# Patient Record
Sex: Male | Born: 1945 | Race: White | Hispanic: No | Marital: Married | Smoking: Never smoker
Health system: Southern US, Community
[De-identification: ages and names within clinical notes are randomized; demographics above are authoritative.]

## PROBLEM LIST (undated history)

## (undated) DIAGNOSIS — I10 Essential (primary) hypertension: Secondary | ICD-10-CM

## (undated) DIAGNOSIS — Z8619 Personal history of other infectious and parasitic diseases: Secondary | ICD-10-CM

## (undated) DIAGNOSIS — R011 Cardiac murmur, unspecified: Secondary | ICD-10-CM

## (undated) DIAGNOSIS — F419 Anxiety disorder, unspecified: Secondary | ICD-10-CM

## (undated) DIAGNOSIS — H3320 Serous retinal detachment, unspecified eye: Secondary | ICD-10-CM

## (undated) DIAGNOSIS — Z85828 Personal history of other malignant neoplasm of skin: Secondary | ICD-10-CM

## (undated) HISTORY — DX: Anxiety disorder, unspecified: F41.9

## (undated) HISTORY — DX: Essential (primary) hypertension: I10

## (undated) HISTORY — DX: Serous retinal detachment, unspecified eye: H33.20

## (undated) HISTORY — DX: Personal history of other infectious and parasitic diseases: Z86.19

## (undated) HISTORY — DX: Personal history of other malignant neoplasm of skin: Z85.828

## (undated) HISTORY — DX: Cardiac murmur, unspecified: R01.1

---

## 2005-04-13 ENCOUNTER — Ambulatory Visit: Payer: Self-pay | Admitting: Internal Medicine

## 2005-06-08 ENCOUNTER — Ambulatory Visit: Payer: Self-pay | Admitting: Internal Medicine

## 2006-08-09 ENCOUNTER — Ambulatory Visit: Payer: Self-pay | Admitting: Internal Medicine

## 2006-08-24 ENCOUNTER — Ambulatory Visit: Payer: Self-pay | Admitting: Internal Medicine

## 2006-08-24 LAB — CONVERTED CEMR LAB
GFR calc Af Amer: 147 mL/min
GFR calc non Af Amer: 121 mL/min
HDL: 38.1 mg/dL — ABNORMAL LOW (ref >39.0)
Hgb A1c MFr Bld: 5.8 % (ref 4.6–6.0)
LDL Cholesterol: 124 mg/dL — ABNORMAL HIGH (ref 0–99)
Potassium: 4 meq/L (ref 3.5–5.1)
Sodium: 140 meq/L (ref 135–145)
Total CHOL/HDL Ratio: 4.8
Triglycerides: 98 mg/dL (ref 0–149)
VLDL: 20 mg/dL (ref 0–40)

## 2007-05-10 HISTORY — PX: CATARACT EXTRACTION: SUR2

## 2007-12-05 ENCOUNTER — Encounter: Payer: Self-pay | Admitting: Internal Medicine

## 2008-01-15 ENCOUNTER — Ambulatory Visit: Payer: Self-pay | Admitting: Internal Medicine

## 2008-01-15 LAB — CONVERTED CEMR LAB
AST: 27 units/L (ref 0–37)
Albumin: 4.1 g/dL (ref 3.5–5.2)
Alkaline Phosphatase: 60 units/L (ref 39–117)
BUN: 11 mg/dL (ref 6–23)
Bilirubin Urine: NEGATIVE
Bilirubin, Direct: 0.1 mg/dL (ref 0.0–0.3)
Blood in Urine, dipstick: NEGATIVE
Chloride: 105 meq/L (ref 96–112)
Eosinophils Absolute: 0.2 10*3/uL (ref 0.0–0.7)
Eosinophils Relative: 4 % (ref 0.0–5.0)
GFR calc Af Amer: 126 mL/min
GFR calc non Af Amer: 104 mL/min
Glucose, Urine, Semiquant: NEGATIVE
HDL: 34.6 mg/dL — ABNORMAL LOW (ref >39.0)
Ketones, urine, test strip: NEGATIVE
MCV: 105.1 fL — ABNORMAL HIGH (ref 78.0–100.0)
Monocytes Absolute: 0.8 10*3/uL (ref 0.1–1.0)
Neutrophils Relative %: 45.7 % (ref 43.0–77.0)
Nitrite: NEGATIVE
Platelets: 276 10*3/uL (ref 150–400)
Potassium: 4.7 meq/L (ref 3.5–5.1)
Protein, U semiquant: NEGATIVE
RDW: 13.1 % (ref 11.5–14.6)
Sodium: 141 meq/L (ref 135–145)
Specific Gravity, Urine: <1.005
TSH: 3.78 microintl units/mL (ref 0.35–5.50)
Total Bilirubin: 1.1 mg/dL (ref 0.3–1.2)
Total CHOL/HDL Ratio: 5.3
Triglycerides: 95 mg/dL (ref 0–149)
Urobilinogen, UA: 0.2
VLDL: 19 mg/dL (ref 0–40)
WBC Urine, dipstick: NEGATIVE
WBC: 6.1 10*3/uL (ref 4.5–10.5)
pH: 8

## 2008-01-21 ENCOUNTER — Ambulatory Visit: Payer: Self-pay | Admitting: Internal Medicine

## 2008-01-21 DIAGNOSIS — I10 Essential (primary) hypertension: Secondary | ICD-10-CM | POA: Insufficient documentation

## 2008-01-21 DIAGNOSIS — J309 Allergic rhinitis, unspecified: Secondary | ICD-10-CM | POA: Insufficient documentation

## 2008-01-21 DIAGNOSIS — J3089 Other allergic rhinitis: Secondary | ICD-10-CM | POA: Insufficient documentation

## 2008-01-21 DIAGNOSIS — R7309 Other abnormal glucose: Secondary | ICD-10-CM | POA: Insufficient documentation

## 2008-02-12 ENCOUNTER — Ambulatory Visit: Payer: Self-pay | Admitting: Internal Medicine

## 2008-02-12 ENCOUNTER — Encounter: Payer: Self-pay | Admitting: *Deleted

## 2008-02-13 LAB — CONVERTED CEMR LAB: Fecal Occult Bld: NEGATIVE

## 2008-04-07 ENCOUNTER — Telehealth: Payer: Self-pay | Admitting: Internal Medicine

## 2009-03-26 ENCOUNTER — Encounter: Payer: Self-pay | Admitting: Internal Medicine

## 2009-04-13 ENCOUNTER — Ambulatory Visit: Payer: Self-pay | Admitting: Internal Medicine

## 2009-04-13 LAB — CONVERTED CEMR LAB
Albumin: 4 g/dL (ref 3.5–5.2)
Basophils Absolute: 0 10*3/uL (ref 0.0–0.1)
Basophils Relative: 0.4 % (ref 0.0–3.0)
Bilirubin Urine: NEGATIVE
CO2: 28 meq/L (ref 19–32)
Calcium: 9.3 mg/dL (ref 8.4–10.5)
Chloride: 100 meq/L (ref 96–112)
Cholesterol: 156 mg/dL (ref 0–200)
Creatinine, Ser: 0.8 mg/dL (ref 0.4–1.5)
Eosinophils Absolute: 0.2 10*3/uL (ref 0.0–0.7)
Glucose, Bld: 118 mg/dL — ABNORMAL HIGH (ref 70–99)
Glucose, Urine, Semiquant: NEGATIVE
HDL: 30.1 mg/dL — ABNORMAL LOW (ref >39.00)
Hemoglobin: 14.7 g/dL (ref 13.0–17.0)
Lymphocytes Relative: 17.4 % (ref 12.0–46.0)
MCHC: 33.9 g/dL (ref 30.0–36.0)
MCV: 106.2 fL — ABNORMAL HIGH (ref 78.0–100.0)
Monocytes Absolute: 1.5 10*3/uL — ABNORMAL HIGH (ref 0.1–1.0)
Neutro Abs: 8.1 10*3/uL — ABNORMAL HIGH (ref 1.4–7.7)
Neutrophils Relative %: 68 % (ref 43.0–77.0)
PSA: 1.65 ng/mL (ref 0.10–4.00)
RBC: 4.09 M/uL — ABNORMAL LOW (ref 4.22–5.81)
RDW: 11.9 % (ref 11.5–14.6)
Specific Gravity, Urine: <1.005
Total CHOL/HDL Ratio: 5
Total Protein: 7.6 g/dL (ref 6.0–8.3)
Triglycerides: 80 mg/dL (ref 0.0–149.0)
pH: 6

## 2009-04-21 ENCOUNTER — Ambulatory Visit: Payer: Self-pay | Admitting: Internal Medicine

## 2009-04-21 LAB — CONVERTED CEMR LAB: Blood Glucose, Fingerstick: 138

## 2009-05-14 ENCOUNTER — Encounter: Payer: Self-pay | Admitting: Internal Medicine

## 2009-06-01 ENCOUNTER — Telehealth: Payer: Self-pay | Admitting: Internal Medicine

## 2010-06-08 NOTE — Progress Notes (Signed)
Summary: List of BP's and Medications provided by patient  List of BP's and Medications provided by patient   Imported By: Maryln Gottron 05/20/2009 13:43:59  _____________________________________________________________________  External Attachment:    Type:   Image     Comment:   External Document

## 2010-06-08 NOTE — Letter (Signed)
Summary: Letter from patient regarding appointment  Letter from patient regarding appointment   Imported By: Maryln Gottron 05/12/2009 13:45:02  _____________________________________________________________________  External Attachment:    Type:   Image     Comment:   External Document

## 2010-06-08 NOTE — Progress Notes (Signed)
Summary: rx for shingles vaccine  Phone Note Call from Patient Call back at Home Phone [phone]   Caller: Patient Summary of Call: Shingles vaccine to [facility]  Initial call taken by: Romualdo Bolk, CMA Duncan Dull),  June 01, 2009 1:15 PM  Follow-up for Phone Call        rx sent electronically Follow-up by: Romualdo Bolk, CMA (AAMA),  June 01, 2009 1:16 PM    New/Updated Medications: ZOSTAVAX 57846 UNT/0.65ML SOLR (ZOSTER VACCINE LIVE) inject subq as directed Prescriptions: ZOSTAVAX 96295 UNT/0.65ML SOLR (ZOSTER VACCINE LIVE) inject subq as directed  #1 x 0   Entered by:   Romualdo Bolk, CMA (AAMA)   Authorized by:   Madelin Headings MD   Signed by:   Romualdo Bolk, CMA (AAMA) on 06/01/2009   Method used:   Electronically to        [facility]* (retail)       [address] [place], [place]  [address]       Ph: [phone] or [phone]       Fax: [phone]   RxID:   [id number]

## 2010-07-26 ENCOUNTER — Other Ambulatory Visit: Payer: Self-pay | Admitting: Internal Medicine

## 2010-07-28 ENCOUNTER — Encounter: Payer: Self-pay | Admitting: *Deleted

## 2010-07-28 DIAGNOSIS — Z125 Encounter for screening for malignant neoplasm of prostate: Secondary | ICD-10-CM | POA: Insufficient documentation

## 2010-07-28 DIAGNOSIS — R718 Other abnormality of red blood cells: Secondary | ICD-10-CM | POA: Insufficient documentation

## 2010-08-16 ENCOUNTER — Other Ambulatory Visit (INDEPENDENT_AMBULATORY_CARE_PROVIDER_SITE_OTHER): Payer: PRIVATE HEALTH INSURANCE | Admitting: Internal Medicine

## 2010-08-16 DIAGNOSIS — R739 Hyperglycemia, unspecified: Secondary | ICD-10-CM

## 2010-08-16 DIAGNOSIS — E785 Hyperlipidemia, unspecified: Secondary | ICD-10-CM

## 2010-08-16 DIAGNOSIS — R7309 Other abnormal glucose: Secondary | ICD-10-CM

## 2010-08-16 DIAGNOSIS — R972 Elevated prostate specific antigen [PSA]: Secondary | ICD-10-CM

## 2010-08-16 LAB — CBC WITH DIFFERENTIAL/PLATELET
Basophils Absolute: 0 10*3/uL (ref 0.0–0.1)
Eosinophils Relative: 10.5 % — ABNORMAL HIGH (ref 0.0–5.0)
HCT: 43.5 % (ref 39.0–52.0)
Lymphs Abs: 1.9 10*3/uL (ref 0.7–4.0)
MCV: 103.1 fl — ABNORMAL HIGH (ref 78.0–100.0)
Monocytes Absolute: 0.7 10*3/uL (ref 0.1–1.0)
Platelets: 263 10*3/uL (ref 150.0–400.0)
RDW: 13.2 % (ref 11.5–14.6)

## 2010-08-16 LAB — BASIC METABOLIC PANEL
BUN: 14 mg/dL (ref 6–23)
Chloride: 99 mEq/L (ref 96–112)
Glucose, Bld: 115 mg/dL — ABNORMAL HIGH (ref 70–99)
Potassium: 3.9 mEq/L (ref 3.5–5.1)

## 2010-08-16 LAB — LIPID PANEL: Cholesterol: 184 mg/dL (ref 0–200)

## 2010-08-16 LAB — TSH: TSH: 3.64 u[IU]/mL (ref 0.35–5.50)

## 2010-08-19 ENCOUNTER — Encounter: Payer: Self-pay | Admitting: Internal Medicine

## 2010-08-23 ENCOUNTER — Encounter: Payer: Self-pay | Admitting: Internal Medicine

## 2010-08-23 ENCOUNTER — Ambulatory Visit (INDEPENDENT_AMBULATORY_CARE_PROVIDER_SITE_OTHER): Payer: PRIVATE HEALTH INSURANCE | Admitting: Internal Medicine

## 2010-08-23 ENCOUNTER — Telehealth: Payer: Self-pay | Admitting: *Deleted

## 2010-08-23 VITALS — BP 140/80 | HR 88 | Ht 62.75 in | Wt 148.0 lb

## 2010-08-23 DIAGNOSIS — I10 Essential (primary) hypertension: Secondary | ICD-10-CM

## 2010-08-23 DIAGNOSIS — J309 Allergic rhinitis, unspecified: Secondary | ICD-10-CM

## 2010-08-23 DIAGNOSIS — R718 Other abnormality of red blood cells: Secondary | ICD-10-CM

## 2010-08-23 DIAGNOSIS — Z0000 Encounter for general adult medical examination without abnormal findings: Secondary | ICD-10-CM

## 2010-08-23 DIAGNOSIS — E785 Hyperlipidemia, unspecified: Secondary | ICD-10-CM

## 2010-08-23 DIAGNOSIS — R7309 Other abnormal glucose: Secondary | ICD-10-CM

## 2010-08-23 DIAGNOSIS — Z23 Encounter for immunization: Secondary | ICD-10-CM

## 2010-08-23 MED ORDER — METOPROLOL SUCCINATE ER 25 MG PO TB24: ORAL_TABLET | ORAL | Status: DC

## 2010-08-23 MED ORDER — LISINOPRIL 20 MG PO TABS: 20.0000 mg | ORAL_TABLET | Freq: Every day | ORAL | Status: DC

## 2010-08-23 NOTE — Telephone Encounter (Signed)
Per Dr. Fabian Sharp- ask pharmacy to see if metoprolol xl 50ml taking 1/2 daily #45 with the same about refills. Pt aware of this as well.

## 2010-08-23 NOTE — Telephone Encounter (Signed)
Pharmacy called saying that the metoprolol xl 25mg  taking 1 1/2 tabs a daily would cost pt $109.40 #90. Where as the metoprolol 50mg  was a lot cheaper for him.

## 2010-08-23 NOTE — Patient Instructions (Addendum)
We will change your metoprolol to extended release and take 1-1/2 a day of a 25 mg Continue on the lisinopril continue to monitor your blood pressure readings. Ask your eye physicians about the use of Flonase if this may help your allergic symptoms better than the past 10 days Walking is an excellent exercise continue on the Mediterranean diet.   Phone in or mail in or fax in blood pressure readings  in about 3 months. Otherwise we can continue to see you yearly.

## 2010-08-23 NOTE — Progress Notes (Signed)
Medicare Attestation I have personally reviewed: The patient's medical and social history Their use of alcohol, tobacco or illicit drugs Their current medications and supplements The patient's functional ability including ADLs,fall risks, home safety risks, cognitive, and hearing and visual impairment Diet and physical activities Evidence for depression or mood disorders  The patient's weight, height, BMI, and visual acuity have been recorded in the chart.  I have made referrals, counseling, and provided education to the patient based on review of the above and I have provided the patient with a written personalized care plan for preventive services.     Lorretta Harp   08/23/2010       Subjective:    Samuel Pearson is a 65 y.o. male who presents forto Medicare exam.  And   Medication  Evaluation and update  No major change in health status since last visit . ecept he had a dteached retina HT: he is on IR metoprolol 25 bid but often forgets tot take the second dose of med.  NO se noted  BP readings 139/82 with cuff before coming in to visit . Has whit coat ohenom  His other readings at home are at goal majority of time.  He denies any CV pulm sx with this.  Denies se of ace .  LIPIDS; taking niacin on his own and denies se and flushing .  Want to continue this regimen.  Allergy : sometimes problematic with eye sx and nose . Takes alavert.  On patanol type drops and ns and doing adequately.  No wheezing or chronic cough  Cardiac risk factors: advanced age (older than 14 for men, 61 for women), hypertension and male gender.  Depression Screen (Note: if answer to either of the following is "Yes", a more complete depression screening is indicated)  Q1: Over the past two weeks, have you felt down, depressed or hopeless? no Q2: Over the past two weeks, have you felt little interest or pleasure in doing things? no  Activities of Daily Living In your present state of health, do  you have any difficulty performing the following activities?:  Preparing food and eating?: No Bathing yourself: No Getting dressed: No Using the toilet:No Moving around from place to place: No In the past year have you fallen or had a near fall?:No  Current exercise habits: Home exercise routine includes walking 1/2 hrs per day.  Dietary issues discussed: reviewed no limitations  Hearing difficulties: No Safe in current home environment:   Safety: No falls .  Has smoke detector and wears seat belts.  No firearms. No excess sun exposure. Sees dentist regularly   Advance directive :  Reviewed  Memory:   No sig concerns  Past Medical History  Diagnosis Date  . Allergic rhinitis   . Hypertension   . History of chicken pox   . Detached retina    Past Surgical History  Procedure Date  . Cataract extraction 2009    [facility]    reports that he has never smoked. He does not have any smokeless tobacco history on file. He reports that he drinks alcohol. He reports that he does not use illicit drugs. family history includes Arthritis in his father and mother and Hypertension in his father and mother. No Known Allergies   The following portions of the patient's history were reviewed and updated as appropriate: allergies, current medications, past family history, past medical history, past social history, past surgical history and problem list.  Review of Systems A comprehensive  review of systems was negative.   Except as per hpin     Objective:     Vision  Last eye exam: Dr. Philip Aspen done on 09/06/2009- Cataracts surgery and detatched retina Blood pressure 140/80, pulse 88, height 5' 2.75" (1.594 m), weight 148 lb (67.132 kg). Body mass index is 26.43 kg/(m^2).   Physical Exam: Vital signs reviewed ZOX:WRUE is a well-developed well-nourished alert cooperative  White male  who appears   stated age in no acute distress.  HEENT: normocephalic  traumatic , Eyes: PERRL EOM's  full, conjunctiva clear,  Glasses Nares: patent no deformity discharge or tenderness. Mild congestions, Ears: no deformity EAC's clear TMs with normal landmarks. Mouth: clear OP, no lesions, edema.  Moist mucous membranes. Dentition in adequate repair. NECK: supple without masses, thyromegaly or bruits. CHEST/PULM:  Clear to auscultation and percussion breath sounds equal no wheeze , rales or rhonchi. No chest wall deformities or tenderness. CV: PMI is nondisplaced, S1 S2 no gallops, murmurs, rubs. Peripheral pulses are full without delay.No JVD .  ABDOMEN: Bowel sounds normal nontender  No guard or rebound, no hepato splenomegal no CVA tenderness.  No hernia. Extremtities:  No clubbing cyanosis or edema, no acute joint swelling or redness no focal atrophy NEURO:  Oriented x3, cranial nerves 3-12 appear to be intact, no obvious focal weakness,gait within normal limits no abnormal reflexes or asymmetrical SKIN: No acute rashes normal turgor, color, no bruising or petechiae. PSYCH: Oriented, good eye contact, no obvious depression anxiety, cognition and judgment appear normal. LN:  No cervical axillary or inguinal adenopathy prostate declined this year. No sx          Assessment:  Medicare wellness  HT  With  White coat effect but good at home.   Had se of diuretics and other meds   He is however missing second dose  Would be better to take xr of the b blocker to reduce swings in bp .    Will change to xr 25  But if  Too expensive then 1/2 of 50 xr  Once a day  . LIPIDS: prefers no statin wants to remain on niacin Allergic  Eye and nasal sx   No alarm features  Counseled.   Expectant management.        Plan:  See above.    During the course of the visit the patient was educated and counseled about appropriate screening and preventive services including:   Pneumococcal vaccine   Nutrition counseling   Patient Instructions (the written plan) was given to the patient.

## 2010-08-29 ENCOUNTER — Encounter: Payer: Self-pay | Admitting: Internal Medicine

## 2010-08-29 DIAGNOSIS — E785 Hyperlipidemia, unspecified: Secondary | ICD-10-CM | POA: Insufficient documentation

## 2010-08-29 DIAGNOSIS — Z0000 Encounter for general adult medical examination without abnormal findings: Secondary | ICD-10-CM | POA: Insufficient documentation

## 2010-08-29 NOTE — Assessment & Plan Note (Signed)
fbs    Elevated  Somewhat but a1c is below 6

## 2010-08-29 NOTE — Assessment & Plan Note (Addendum)
Stable.     Check  Yearly.

## 2010-09-22 ENCOUNTER — Telehealth: Payer: Self-pay | Admitting: *Deleted

## 2010-09-22 NOTE — Telephone Encounter (Signed)
Pt wrote a letter stating that he began taking metoprolol 50mg  er. After a week of sig. Swings in his bp, it settled down. However, his sinusis became constantly swollen and he didn't feel right. He had a flavored coffee last week and a rash covered his face, neck and arms within mins. He states that it felt like a sunburn. He took a benadryl and it was fine after 1 hour. He has d/c the er and is back to taking Metroprolol 25mg  bid. He feels much better and sinuses have cleared.

## 2010-09-23 NOTE — Telephone Encounter (Signed)
Am unsure if this was really the med and not allergy but ok to switch back to IR med if Bp controlled.

## 2010-09-23 NOTE — Telephone Encounter (Signed)
Rx sent to pharmacy and pt aware. 

## 2011-02-10 ENCOUNTER — Telehealth: Payer: Self-pay | Admitting: *Deleted

## 2011-02-10 DIAGNOSIS — J3489 Other specified disorders of nose and nasal sinuses: Secondary | ICD-10-CM

## 2011-02-10 NOTE — Telephone Encounter (Signed)
Pt sent a letter saying that during his annual exam you discussed a spot on the side of his nose. You recommended that he watch it and reschedule an appointment if it didn't totally heal. It continues to look like a sun burn. Pink but no growth. He will be of town for two weeks in November but will be available anytime or day other than 11/12 thru 11/16 and 11/20-11/27

## 2011-02-16 NOTE — Telephone Encounter (Signed)
Pt aware and order sent to [facility]. 

## 2011-02-16 NOTE — Telephone Encounter (Signed)
I suggest if still there we get a dermatologist consult  To check this area.Samuel Pearson

## 2011-06-06 ENCOUNTER — Other Ambulatory Visit: Payer: Self-pay | Admitting: Internal Medicine

## 2011-09-02 ENCOUNTER — Other Ambulatory Visit: Payer: Self-pay | Admitting: Internal Medicine

## 2011-09-14 DIAGNOSIS — H02889 Meibomian gland dysfunction of unspecified eye, unspecified eyelid: Secondary | ICD-10-CM | POA: Insufficient documentation

## 2011-09-14 DIAGNOSIS — H02839 Dermatochalasis of unspecified eye, unspecified eyelid: Secondary | ICD-10-CM | POA: Insufficient documentation

## 2011-09-14 DIAGNOSIS — Z961 Presence of intraocular lens: Secondary | ICD-10-CM | POA: Insufficient documentation

## 2011-09-14 DIAGNOSIS — H59819 Chorioretinal scars after surgery for detachment, unspecified eye: Secondary | ICD-10-CM | POA: Insufficient documentation

## 2011-09-14 DIAGNOSIS — H43819 Vitreous degeneration, unspecified eye: Secondary | ICD-10-CM | POA: Insufficient documentation

## 2011-09-14 DIAGNOSIS — H33319 Horseshoe tear of retina without detachment, unspecified eye: Secondary | ICD-10-CM | POA: Insufficient documentation

## 2011-11-23 ENCOUNTER — Other Ambulatory Visit (INDEPENDENT_AMBULATORY_CARE_PROVIDER_SITE_OTHER): Payer: PRIVATE HEALTH INSURANCE

## 2011-11-23 DIAGNOSIS — Z125 Encounter for screening for malignant neoplasm of prostate: Secondary | ICD-10-CM

## 2011-11-23 DIAGNOSIS — Z79899 Other long term (current) drug therapy: Secondary | ICD-10-CM

## 2011-11-23 DIAGNOSIS — Z0000 Encounter for general adult medical examination without abnormal findings: Secondary | ICD-10-CM

## 2011-11-23 LAB — BASIC METABOLIC PANEL
GFR: 125.57 mL/min (ref >60.00)
Potassium: 4.3 mEq/L (ref 3.5–5.1)
Sodium: 137 mEq/L (ref 135–145)

## 2011-11-23 LAB — POCT URINALYSIS DIPSTICK
Ketones, UA: NEGATIVE
Leukocytes, UA: NEGATIVE
Nitrite, UA: NEGATIVE
Protein, UA: NEGATIVE
Urobilinogen, UA: 0.2

## 2011-11-23 LAB — HEPATIC FUNCTION PANEL
Bilirubin, Direct: 0.1 mg/dL (ref 0.0–0.3)
Total Bilirubin: 0.7 mg/dL (ref 0.3–1.2)
Total Protein: 7.1 g/dL (ref 6.0–8.3)

## 2011-11-23 LAB — CBC WITH DIFFERENTIAL/PLATELET
Eosinophils Relative: 2.7 % (ref 0.0–5.0)
HCT: 44.1 % (ref 39.0–52.0)
Hemoglobin: 14.3 g/dL (ref 13.0–17.0)
Lymphs Abs: 1.9 10*3/uL (ref 0.7–4.0)
Monocytes Relative: 16.3 % — ABNORMAL HIGH (ref 3.0–12.0)
Neutro Abs: 3.4 10*3/uL (ref 1.4–7.7)
WBC: 6.6 10*3/uL (ref 4.5–10.5)

## 2011-11-23 LAB — TSH: TSH: 3.7 u[IU]/mL (ref 0.35–5.50)

## 2011-11-23 LAB — LIPID PANEL
Cholesterol: 192 mg/dL (ref 0–200)
LDL Cholesterol: 122 mg/dL — ABNORMAL HIGH (ref 0–99)
VLDL: 25.4 mg/dL (ref 0.0–40.0)

## 2011-11-23 LAB — PSA: PSA: 1.42 ng/mL (ref 0.10–4.00)

## 2011-11-24 ENCOUNTER — Other Ambulatory Visit: Payer: Self-pay | Admitting: Internal Medicine

## 2011-11-30 ENCOUNTER — Ambulatory Visit (INDEPENDENT_AMBULATORY_CARE_PROVIDER_SITE_OTHER): Payer: PRIVATE HEALTH INSURANCE | Admitting: Internal Medicine

## 2011-11-30 ENCOUNTER — Encounter: Payer: Self-pay | Admitting: Internal Medicine

## 2011-11-30 VITALS — BP 156/90 | HR 95 | Temp 97.9°F | Ht 62.5 in | Wt 147.0 lb

## 2011-11-30 DIAGNOSIS — I10 Essential (primary) hypertension: Secondary | ICD-10-CM

## 2011-11-30 DIAGNOSIS — R718 Other abnormality of red blood cells: Secondary | ICD-10-CM

## 2011-11-30 DIAGNOSIS — E785 Hyperlipidemia, unspecified: Secondary | ICD-10-CM

## 2011-11-30 DIAGNOSIS — R011 Cardiac murmur, unspecified: Secondary | ICD-10-CM

## 2011-11-30 DIAGNOSIS — Z0000 Encounter for general adult medical examination without abnormal findings: Secondary | ICD-10-CM

## 2011-11-30 DIAGNOSIS — R7309 Other abnormal glucose: Secondary | ICD-10-CM

## 2011-11-30 NOTE — Patient Instructions (Signed)
Intensify healthy lifestyle-diet avoid simple carbohydrates exercise as tolerated. Limit alcoholic beverages to 14 a week on average  2 new blood pressure monitoring as you're doing.  If you change your mind we can get cardiology to listen to your chest.  Continue same medication is now send in stool cards for colon cancer screening and followup checkup in a year.  Contact us in the meantime to see if you want to have an as needed anxiety medicine especially when you travel. I would use either Xanax or lorazepam to use as needed.  Alcohol and caffeine can aggravate anxiety at times.

## 2011-11-30 NOTE — Progress Notes (Signed)
Subjective:    Patient ID: Samuel Pearson, male    DOB: [birth year], 66 y.o.   MRN: [id number]  HPI Patient comes in today for preventive visit and follow-up of medical issues. Update  history since  last visit: No major change in health. Has taken bp readings at home and reviewed in 120-135 range over 80 ocass 100 pulse  thinks that red beets lower Bp also . No cp sob syncope. Hx of cataract surgery per dr Rubye [facility] Had a bx of facial lesion was benign verroucous but  Was anxiety provoking.  LIPIDS  Take ocassional otc niacin  And asa   Hearing: ok   Vision:  No limitations at present .  Safety:  Has smoke detector and wears seat belts.  No firearms. No excess sun exposure. Sees dentist regularly.  Falls: no   Advance directive :  Reviewed   Memory: Felt to be good  , no concern from  family.  Depression: No anhedonia unusual crying or depressive symptoms  Nutrition: Eats well balanced diet; adequate calcium and vitamin D. No swallowing chewiing problems.  Injury: no major injuries in the last six months.  Other healthcare providers:  Reviewed today .  Social:  Lives with wife married. 2 dog   Preventive parameters: decline  Colonoscopy, had stool cards done .   ADLS:   There are no problems or need for assistance  driving, feeding, obtaining food, dressing, toileting and bathing, managing money using phone.  is independent.  Exercise walking etoh 1-2 per day sometime more Non smoker  Review of Systems  ROS:  GEN/ HEENT: No fever, significant weight changes sweats headaches vision problems hearing changes, CV/ PULM; No chest pain shortness of breath cough, syncope,edema  change in exercise tolerance. GI /GU: No adominal pain, vomiting, change in bowel habits. No blood in the stool. No significant GU symptoms. SKIN/HEME: ,no acute skin rashes suspicious lesions or bleeding. No lymphadenopathy, nodules, masses.  NEURO/ PSYCH:  No neurologic signs such as  weakness numbness. No depression is an anxious person but no panic disorder  Wife got sick on a trip and he  Had overreaction  Feels fine at present IMM/ Allergy: No unusual infections.    REST of 12 system review negative except as per HPI  Outpatient Encounter Prescriptions as of 11/30/2011  Medication Sig Dispense Refill  . b complex vitamins tablet Take 1 tablet by mouth daily.        Marland Kitchen ketotifen (ZADITOR) 0.025 % ophthalmic solution 1 drop 2 (two) times daily.        Marland Kitchen lisinopril (PRINIVIL,ZESTRIL) 20 MG tablet TAKE ONE TABLET BY MOUTH ONE TIME DAILY  90 tablet  0  . loratadine (CLARITIN) 10 MG tablet Take 10 mg by mouth daily.        . metoprolol tartrate (LOPRESSOR) 25 MG tablet Take 25 mg by mouth 2 (two) times daily.        . MULTIPLE VITAMIN PO Take by mouth.        . niacin 500 MG tablet Take 500 mg by mouth daily with breakfast.        . Olopatadine HCl (PATANASE) 0.6 % SOLN by Nasal route.        Marland Kitchen DISCONTD: metoprolol (LOPRESSOR) 50 MG tablet TAKE ONE-HALF TABLET BY MOUTH TWICE DAILY  30 tablet  0  Past history family history social history reviewed in the electronic medical record. Had hx of murmur? As a child always got pulled aside to  listen to heart but no disease.  Has had  30 years of elevated mcv .    neg fam hx of dm  Past history family history social history reviewed in the electronic medical record.  Objective:   Physical Exam BP 156/90  Pulse 95  Temp 97.9 F (36.6 C) (Oral)  Ht 5' 2.5" (1.588 m)  Wt 147 lb (66.679 kg)  BMI 26.46 kg/m2  SpO2 96% Physical Exam: Vital signs reviewed [id number] is a well-developed well-nourished alert cooperative  White male  who appears   stated age in no acute distress.  HEENT: normocephalic  traumatic , Eyes: PE lens in plants EOM's full, conjunctiva clear, Nares: patent no deformity discharge or tenderness., Ears: no deformity EAC's clear TMs with normal landmarks. Mouth: clear OP, no lesions, edema.  Moist mucous membranes.  Dentition in adequate repair. NECK: supple without masses, thyromegaly or bruits. CHEST/PULM:  Clear to auscultation and percussion breath sounds equal no wheeze , rales or rhonchi. No chest wall deformities or tenderness. CV: PMI is nondisplaced, S1 S2 no gallops, , rubs. Peripheral pulses are full without delay.No JVD .  there is a continuous to and fro sound right upper chest  More with supine  Non radiating   No thrills and no neck sounds ABDOMEN: Bowel sounds normal nontender  No guard or rebound, no hepato splenomegal no CVA tenderness.  No hernia. Extremtities:  No clubbing cyanosis or edema, no acute joint swelling or redness no focal atrophy NEURO:  Oriented x3, cranial nerves 3-12 appear to be intact, no obvious focal weakness,gait within normal limits no abnormal reflexes or asymmetrical SKIN: No acute rashes normal turgor, color, no bruising or petechiae. PSYCH: Oriented, good eye contact, no obvious depression cognition and judgment appear normal. Modestly anxious ( normal )  Oriented x 3 and no noted deficits in memory, attention, and speech.  LN:  No cervical axillary or inguinal adenopathy   Lab Results  Component Value Date   WBC 6.6 11/23/2011   HGB 14.3 11/23/2011   HCT 44.1 11/23/2011   PLT 259.0 11/23/2011   GLUCOSE 118* 11/23/2011   CHOL 192 11/23/2011   TRIG 127.0 11/23/2011   HDL 45.00 11/23/2011   LDLCALC 122* 11/23/2011   ALT 34 11/23/2011   AST 31 11/23/2011   NA 137 11/23/2011   K 4.3 11/23/2011   CL 99 11/23/2011   CREATININE 0.7 11/23/2011   BUN 15 11/23/2011   CO2 26 11/23/2011   TSH 3.70 11/23/2011   PSA 1.42 11/23/2011   HGBA1C 5.9 08/16/2010      Assessment & Plan:  Preventive Health Care Counseled regarding healthy nutrition, exercise, sleep, injury prevention, calcium vit d and healthy weight . Decline coloon stool cards given BG stable  Would like to repeat a1c at next blood tests.  Pt aware neg fam hx dm HT W[facility]  Normal and controlled at home  Continue . No cv  sx  Murmur vs venous hum or other  Apparently has had in past but I dont remember hearing this before will follow.  Pt decline cards check or echo as he feels fine .  Allergy stabel  Anxiety personality disc options offered prn xanax for the trip to [place] will call if needed.  MCV elevation  Continues he says for years with nl other labs

## 2011-12-03 ENCOUNTER — Encounter: Payer: Self-pay | Admitting: Internal Medicine

## 2011-12-03 DIAGNOSIS — Z79899 Other long term (current) drug therapy: Secondary | ICD-10-CM | POA: Insufficient documentation

## 2011-12-03 DIAGNOSIS — R011 Cardiac murmur, unspecified: Secondary | ICD-10-CM | POA: Insufficient documentation

## 2011-12-05 ENCOUNTER — Other Ambulatory Visit: Payer: Self-pay | Admitting: Internal Medicine

## 2012-01-21 ENCOUNTER — Other Ambulatory Visit: Payer: Self-pay | Admitting: Internal Medicine

## 2012-01-25 ENCOUNTER — Other Ambulatory Visit: Payer: Self-pay | Admitting: Internal Medicine

## 2012-01-25 MED ORDER — METOPROLOL TARTRATE 25 MG PO TABS: 25.0000 mg | ORAL_TABLET | Freq: Two times a day (BID) | ORAL | Status: DC

## 2012-02-17 ENCOUNTER — Encounter: Payer: Self-pay | Admitting: Internal Medicine

## 2012-02-17 ENCOUNTER — Ambulatory Visit (INDEPENDENT_AMBULATORY_CARE_PROVIDER_SITE_OTHER): Payer: PRIVATE HEALTH INSURANCE | Admitting: Internal Medicine

## 2012-02-17 VITALS — HR 78 | Temp 98.6°F | Wt 150.0 lb

## 2012-02-17 DIAGNOSIS — L255 Unspecified contact dermatitis due to plants, except food: Secondary | ICD-10-CM

## 2012-02-17 MED ORDER — LISINOPRIL 20 MG PO TABS: 20.0000 mg | ORAL_TABLET | Freq: Every day | ORAL | Status: DC

## 2012-02-17 MED ORDER — PREDNISONE 10 MG PO TABS: ORAL_TABLET | ORAL | Status: DC

## 2012-02-17 MED ORDER — FLUOCINONIDE-E 0.05 % EX CREA: TOPICAL_CREAM | Freq: Two times a day (BID) | CUTANEOUS | Status: DC

## 2012-02-17 NOTE — Patient Instructions (Signed)
Poison Ivy  Poison ivy is a inflammation of the skin (contact dermatitis) caused by touching the allergens on the leaves of the ivy plant following previous exposure to the plant. The rash usually appears 48 hours after exposure. The rash is usually bumps (papules) or blisters (vesicles) in a linear pattern. Depending on your own sensitivity, the rash may simply cause redness and itching, or it may also progress to blisters which may break open. These must be well cared for to prevent secondary bacterial (germ) infection, followed by scarring. Keep any open areas dry, clean, dressed, and covered with an antibacterial ointment if needed. The eyes may also get puffy. The puffiness is worst in the morning and gets better as the day progresses. This dermatitis usually heals without scarring, within 2 to 3 weeks without treatment.  HOME CARE INSTRUCTIONS   Thoroughly wash with soap and water as soon as you have been exposed to poison ivy. You have about one half hour to remove the plant resin before it will cause the rash. This washing will destroy the oil or antigen on the skin that is causing, or will cause, the rash. Be sure to wash under your fingernails as any plant resin there will continue to spread the rash. Do not rub skin vigorously when washing affected area. Poison ivy cannot spread if no oil from the plant remains on your body. A rash that has progressed to weeping sores will not spread the rash unless you have not washed thoroughly. It is also important to wash any clothes you have been wearing as these may carry active allergens. The rash will return if you wear the unwashed clothing, even several days later.  Avoidance of the plant in the future is the best measure. Poison ivy plant can be recognized by the number of leaves. Generally, poison ivy has three leaves with flowering branches on a single stem.  Diphenhydramine may be purchased over the counter and used as needed for itching. Do not drive with  this medication if it makes you drowsy.Ask your caregiver about medication for children.  SEEK MEDICAL CARE IF:   Open sores develop.   Redness spreads beyond area of rash.   You notice purulent (pus-like) discharge.   You have increased pain.   Other signs of infection develop (such as fever).  Document Released: 04/22/2000 Document Revised: 07/18/2011 Document Reviewed: 03/11/2009  [employer] Patient Information 2013 [employer].

## 2012-02-18 ENCOUNTER — Encounter: Payer: Self-pay | Admitting: Internal Medicine

## 2012-02-18 NOTE — Progress Notes (Signed)
  Subjective:    Patient ID: Samuel Pearson, male    DOB: 06-04-1945, 66 y.o.   MRN: [id number]  HPI Patient comes in today for SDA for  new problem evaluation. Exposed to yard waste carrying 6 days ago and then broke out in itchy rash foerarms and legs  ( non covered areas . Using benadryl and otc hcs  Breaking out some more on arms but rest ok no pain  No resp sx fever remote hx of pi 3 x in lifetime . Going [place] trip [place]  Asks about flu vaccine Review of Systems Neg fever chills cp sob  No cough Declines  meds for anxiety  Allergy stable  Past history family history social history reviewed in the electronic medical record.     Objective:   Physical Exam Pulse 78  Temp 98.6 F (37 C) (Oral)  Wt 150 lb (68.04 kg)  SpO2 97% declines bp has WCHT effect  WDWn in [place]  With obvious rash forearms  And  Both legs extensive and some weeping on arm  redness CD pattern.  Few areas on forehead  No edema  Hands clear  Anxiety stable  Looks well      Assessment & Plan:   Plant dermatitis;  4 extremity   Counseled. Topical given but advise  pred 12 course with explanation   Expectant management. Because of the pred rx  Would wait 2 weeks   Theoretical dec immune response   Will get vaccine later.   Anxiety declined prn travel meds.  Ok.

## 2012-03-26 ENCOUNTER — Telehealth: Payer: Self-pay | Admitting: Internal Medicine

## 2012-03-26 NOTE — Telephone Encounter (Signed)
Caller: Joan/Spouse; Phone: [phone]; Reason for Call: Pt.  Is visiting his Dad out of state who was dxed 03/25/12 with pneumonia - unsure if viral or bacterial.  Wife is calling to see if husband has had a pneumonia shot.  Informed he had 04/12.  Reviewed prevention measures and to call for any sxs.

## 2012-06-19 ENCOUNTER — Encounter: Payer: Self-pay | Admitting: Internal Medicine

## 2012-09-04 ENCOUNTER — Other Ambulatory Visit: Payer: Self-pay | Admitting: Internal Medicine

## 2012-12-18 ENCOUNTER — Other Ambulatory Visit: Payer: Self-pay | Admitting: Internal Medicine

## 2013-03-11 ENCOUNTER — Other Ambulatory Visit (INDEPENDENT_AMBULATORY_CARE_PROVIDER_SITE_OTHER): Payer: Medicare Other

## 2013-03-11 DIAGNOSIS — E785 Hyperlipidemia, unspecified: Secondary | ICD-10-CM

## 2013-03-11 DIAGNOSIS — Z0000 Encounter for general adult medical examination without abnormal findings: Secondary | ICD-10-CM

## 2013-03-11 DIAGNOSIS — Z125 Encounter for screening for malignant neoplasm of prostate: Secondary | ICD-10-CM

## 2013-03-11 LAB — HEPATIC FUNCTION PANEL
Albumin: 4.3 g/dL (ref 3.5–5.2)
Bilirubin, Direct: 0.1 mg/dL (ref 0.0–0.3)
Total Bilirubin: 0.9 mg/dL (ref 0.3–1.2)
Total Protein: 7.1 g/dL (ref 6.0–8.3)

## 2013-03-11 LAB — BASIC METABOLIC PANEL
BUN: 16 mg/dL (ref 6–23)
Chloride: 102 mEq/L (ref 96–112)
Creatinine, Ser: 0.7 mg/dL (ref 0.4–1.5)

## 2013-03-11 LAB — LIPID PANEL
Cholesterol: 194 mg/dL (ref 0–200)
LDL Cholesterol: 132 mg/dL — ABNORMAL HIGH (ref 0–99)
Triglycerides: 87 mg/dL (ref 0.0–149.0)

## 2013-03-11 LAB — CBC WITH DIFFERENTIAL/PLATELET
Basophils Absolute: 0 10*3/uL (ref 0.0–0.1)
Eosinophils Absolute: 0.3 10*3/uL (ref 0.0–0.7)
Eosinophils Relative: 4.7 % (ref 0.0–5.0)
MCV: 101.4 fl — ABNORMAL HIGH (ref 78.0–100.0)
Monocytes Absolute: 1 10*3/uL (ref 0.1–1.0)
Neutrophils Relative %: 51.4 % (ref 43.0–77.0)
Platelets: 267 10*3/uL (ref 150.0–400.0)
RDW: 14.4 % (ref 11.5–14.6)
WBC: 6 10*3/uL (ref 4.5–10.5)

## 2013-03-11 NOTE — Addendum Note (Signed)
Addended by: Rita Ohara R on: 03/11/2013 09:06 AM   Modules accepted: Orders

## 2013-03-18 ENCOUNTER — Encounter: Payer: Self-pay | Admitting: Internal Medicine

## 2013-03-18 ENCOUNTER — Ambulatory Visit (INDEPENDENT_AMBULATORY_CARE_PROVIDER_SITE_OTHER): Payer: Medicare Other | Admitting: Internal Medicine

## 2013-03-18 VITALS — BP 154/90 | HR 93 | Temp 97.7°F | Ht 62.25 in | Wt 150.0 lb

## 2013-03-18 DIAGNOSIS — R7309 Other abnormal glucose: Secondary | ICD-10-CM

## 2013-03-18 DIAGNOSIS — IMO0001 Reserved for inherently not codable concepts without codable children: Secondary | ICD-10-CM | POA: Insufficient documentation

## 2013-03-18 DIAGNOSIS — I10 Essential (primary) hypertension: Secondary | ICD-10-CM

## 2013-03-18 DIAGNOSIS — Z0000 Encounter for general adult medical examination without abnormal findings: Secondary | ICD-10-CM

## 2013-03-18 DIAGNOSIS — Z23 Encounter for immunization: Secondary | ICD-10-CM

## 2013-03-18 DIAGNOSIS — Z1211 Encounter for screening for malignant neoplasm of colon: Secondary | ICD-10-CM | POA: Insufficient documentation

## 2013-03-18 DIAGNOSIS — R718 Other abnormality of red blood cells: Secondary | ICD-10-CM

## 2013-03-18 MED ORDER — LISINOPRIL 20 MG PO TABS: ORAL_TABLET | ORAL | Status: DC

## 2013-03-18 MED ORDER — METOPROLOL TARTRATE 25 MG PO TABS: ORAL_TABLET | ORAL | Status: DC

## 2013-03-18 NOTE — Progress Notes (Signed)
Chief Complaint  Patient presents with  . Medicare Wellness    Will get flu injection from pharmacy.    HPI: Patient comes in today for Preventive Medicare wellness visit . No major injuries, ed visits ,hospitalizations , new medications since last visit. Walking  Well.  No limitations  Bp readings 120 - 130 at home   Hearing:  Good   Vision:  No limitations at present . Last eye check UTD  Safety:  Has smoke detector and wears seat belts.  No firearms. No excess sun exposure. Sees dentist regularly.  Falls: no  Advance directive :  Reviewed  Has one.  Memory: Felt to be good  , no concern from her or her family.  Depression: No anhedonia unusual crying or depressive symptoms  Nutrition: Eats well balanced diet; adequate calcium and vitamin D. No swallowing chewing problems.  Injury: no major injuries in the last six months.  Other healthcare providers:  Reviewed today .  Social:  Lives with spouse married.   Preventive parameters: up-to-date  Reviewed   ADLS:   There are no problems or need for assistance  driving, feeding, obtaining food, dressing, toileting and bathing, managing money using phone. She is independent.  EXERCISE/ HABITS  Per week   No tobacco    etohsoc    ROS:  GEN/ HEENT: No fever, significant weight changes sweats headaches vision problems hearing changes, CV/ PULM; No chest pain shortness of breath cough, syncope,edema  change in exercise tolerance. GI /GU: No adominal pain, vomiting, change in bowel habits. No blood in the stool. No significant GU symptoms. SKIN/HEME: ,no acute skin rashes suspicious lesions or bleeding. No lymphadenopathy, nodules, masses.  NEURO/ PSYCH:  No neurologic signs such as weakness numbness. No depression anxiety. IMM/ Allergy: No unusual infections.  Allergy .   REST of 12 system review negative except as per HPI   Past Medical History  Diagnosis Date  . Allergic rhinitis   . Hypertension   . History of chicken  pox   . Detached retina   . Anxiety   . Heart murmur     has had one when a child and checked out ok  never? had an echo    Family History  Problem Relation Age of Onset  . Arthritis Mother   . Hypertension Mother   . Arthritis Father   . Hypertension Father     History   Social History  . Marital Status: Married    Spouse Name: N/A    Number of Children: N/A  . Years of Education: N/A   Social History Main Topics  . Smoking status: Never Smoker   . Smokeless tobacco: None  . Alcohol Use: 0.0 oz/week    14-21 Cans of beer per week  . Drug Use: No  . Sexual Activity: None   Other Topics Concern  . 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 b complex vitamins tablet Take 1 tablet by mouth daily.    Marland Kitchen lisinopril (PRINIVIL,ZESTRIL) 20 MG tablet TAKE ONE TABLET BY MOUTH ONE TIME DAILY  . loratadine (CLARITIN) 10 MG tablet Take 10 mg by mouth daily.    . metoprolol tartrate (LOPRESSOR) 25 MG tablet TAKE ONE TABLET BY MOUTH TWICE  DAILY  . MULTIPLE VITAMIN PO Take by mouth.    . [DISCONTINUED] lisinopril (PRINIVIL,ZESTRIL) 20 MG tablet TAKE ONE TABLET BY MOUTH ONE TIME DAILY   . [DISCONTINUED] metoprolol (LOPRESSOR) 50 MG tablet   . [DISCONTINUED] metoprolol tartrate (LOPRESSOR) 25 MG tablet TAKE ONE TABLET BY MOUTH TWICE DAILY  . Olopatadine HCl (PATANASE) 0.6 % SOLN by Nasal route.    . [DISCONTINUED] fluocinonide-emollient (LIDEX-E) 0.05 % cream Apply topically 2 (two) times daily. For poison ivy, not on face  . [DISCONTINUED] predniSONE (DELTASONE) 10 MG tablet Take pills per day,6,6,6,4,4,4,2,2,2,1,1,1    EXAM:  BP 154/90  Pulse 93  Temp(Src) 97.7 F (36.5 C) (Oral)  Ht 5' 2.25" (1.581 m)  Wt 150 lb (68.04 kg)  BMI 27.22 kg/m2  SpO2 97%  Body mass  index is 27.22 kg/(m^2).  Physical Exam: Vital signs reviewed [id number]:[id number] is a well-developed well-nourished alert cooperative   who appears stated age in no acute distress.  HEENT: normocephalic atraumatic , Eyes: PERRL EOM's full, conjunctiva clear glasses, Nares: paten,t no deformity discharge or tenderness., Ears: no deformity EAC's clear TMs with normal landmarks. Mouth: clear OP, no lesions, edema.  Moist mucous membranes. Dentition in adequate repair. NECK: supple without masses, thyromegaly or bruits. CHEST/PULM:  Clear to auscultation and percussion breath sounds equal no wheeze , rales or rhonchi. No chest wall deformities or tenderness. CV: PMI is nondisplaced, S1 S2 no gallops, murmurs, rubs. Peripheral pulses are full without delay.No JVD .  ABDOMEN: Bowel sounds normal nontender  No guard or rebound, no hepato splenomegal no CVA tenderness.  Extremtities:  No clubbing cyanosis or edema, no acute joint swelling or redness no focal atrophy NEURO:  Oriented x3, cranial nerves 3-12 appear to be intact, no obvious focal weakness,gait within normal limits no abnormal reflexes or asymmetrical SKIN: No acute rashes normal turgor, color, no bruising or petechiae. PSYCH: Oriented, good eye contact, no obvious depression , cognition and judgment appear normal. Only mildly anxious  LN: no cervical axillary adenopathy No noted deficits in memory, attention, and speech.   Lab Results  Component Value Date   WBC 6.0 03/11/2013   HGB 14.8 03/11/2013   HCT 43.7 03/11/2013   PLT 267.0 03/11/2013   GLUCOSE 109* 03/11/2013   CHOL 194 03/11/2013   TRIG 87.0 03/11/2013   HDL 44.70 03/11/2013   LDLCALC 132* 03/11/2013   ALT 23 03/11/2013   AST 27 03/11/2013   NA 138 03/11/2013   K 4.8 03/11/2013   CL 102 03/11/2013   CREATININE 0.7 03/11/2013   BUN 16 03/11/2013   CO2 27 03/11/2013   TSH 5.13 03/11/2013   PSA 1.28 03/11/2013   HGBA1C 5.9 08/16/2010    ASSESSMENT AND PLAN:  Discussed the following  assessment and plan:  Visit for preventive health examination  Medicare annual wellness visit, subsequent  Unspecified essential hypertension  Need for prophylactic vaccination with combined diphtheria-tetanus-pertussis (DTP) vaccine - Plan: Tdap vaccine greater than or equal to 7yo IM  Colon cancer screening - declines colonoscopy  order for ifobt - Plan: Fecal occult blood, imunochemical  Abnormal RBC - incmcv  hx of same better  no otherabnormal parameters   HYPERGLYCEMIA  HTN, white coat Disc llsi to avoid diabetes b controlled at home if needed consider ccb  Will conrinue to check readings  Patient Care Team: Madelin Headings, MD as PCP - [facility] Mervin Hack (Dermatology)  Patient Instructions  150 minutes of exercise weeks  ,  Lose weight  To healthy levels. Avoid trans fats and processed foods;  Increase fresh fruits and veges to 5 servings per day. And avoid sweet beverages  Including tea and juice. Avoid processed carbohydrate .  If needed we could add a CCB  such as amlodipine for blood pressure control.  Add  resistance training to you exercise regimen.     Neta Mends. Jayleon Mcfarlane M.D. Health Maintenance  Topic Date Due  . Influenza Vaccine  12/07/2012  . Colonoscopy  08/22/2020  . Tetanus/tdap  03/19/2023  . Pneumococcal Polysaccharide Vaccine Age 17 And Over  Completed  . Zostavax  Completed   Health Maintenance Review

## 2013-03-18 NOTE — Patient Instructions (Addendum)
150 minutes of exercise weeks  ,  Lose weight  To healthy levels. Avoid trans fats and processed foods;  Increase fresh fruits and veges to 5 servings per day. And avoid sweet beverages  Including tea and juice. Avoid processed carbohydrate .  If needed we could add a CCB  such as amlodipine for blood pressure control.  Add  resistance training to you exercise regimen.

## 2014-02-05 ENCOUNTER — Telehealth: Payer: Self-pay

## 2014-02-05 NOTE — Telephone Encounter (Signed)
Called patient to advise time to schedule annual CPX. Patient states that he will need to check his schedule and call back.

## 2014-03-12 ENCOUNTER — Telehealth: Payer: Self-pay | Admitting: Internal Medicine

## 2014-03-12 MED ORDER — LISINOPRIL 20 MG PO TABS: ORAL_TABLET | ORAL | Status: DC

## 2014-03-12 NOTE — Telephone Encounter (Signed)
[id number] - [place], [place] - [address] is requesting re-fill on lisinopril (PRINIVIL,ZESTRIL) 20 MG tablet

## 2014-03-12 NOTE — Telephone Encounter (Signed)
Sent to the pharmacy by e-scribe for 90 days.  Pt is now due for his Medicare Wellness Exam.  Please help him to get on the schedule.

## 2014-03-12 NOTE — Telephone Encounter (Signed)
Wellness exam is sch for 05/14/14

## 2014-05-06 ENCOUNTER — Other Ambulatory Visit (INDEPENDENT_AMBULATORY_CARE_PROVIDER_SITE_OTHER): Payer: Medicare Other

## 2014-05-06 DIAGNOSIS — Z125 Encounter for screening for malignant neoplasm of prostate: Secondary | ICD-10-CM

## 2014-05-06 DIAGNOSIS — I10 Essential (primary) hypertension: Secondary | ICD-10-CM

## 2014-05-06 DIAGNOSIS — Z0000 Encounter for general adult medical examination without abnormal findings: Secondary | ICD-10-CM

## 2014-05-06 LAB — HEPATIC FUNCTION PANEL
ALK PHOS: 59 U/L (ref 39–117)
ALT: 27 U/L (ref 0–53)
AST: 23 U/L (ref 0–37)
Albumin: 4.4 g/dL (ref 3.5–5.2)
BILIRUBIN DIRECT: 0.1 mg/dL (ref 0.0–0.3)
TOTAL PROTEIN: 7.2 g/dL (ref 6.0–8.3)
Total Bilirubin: 0.7 mg/dL (ref 0.2–1.2)

## 2014-05-06 LAB — CBC WITH DIFFERENTIAL/PLATELET
BASOS ABS: 0 10*3/uL (ref 0.0–0.1)
Basophils Relative: 0.3 % (ref 0.0–3.0)
Eosinophils Absolute: 0.2 10*3/uL (ref 0.0–0.7)
Eosinophils Relative: 3 % (ref 0.0–5.0)
HEMATOCRIT: 44.9 % (ref 39.0–52.0)
HEMOGLOBIN: 14.9 g/dL (ref 13.0–17.0)
Lymphocytes Relative: 27.4 % (ref 12.0–46.0)
Lymphs Abs: 1.6 10*3/uL (ref 0.7–4.0)
MCHC: 33.1 g/dL (ref 30.0–36.0)
MCV: 98.2 fl (ref 78.0–100.0)
Monocytes Absolute: 0.9 10*3/uL (ref 0.1–1.0)
Monocytes Relative: 15.6 % — ABNORMAL HIGH (ref 3.0–12.0)
Neutro Abs: 3.1 10*3/uL (ref 1.4–7.7)
Neutrophils Relative %: 53.7 % (ref 43.0–77.0)
Platelets: 282 10*3/uL (ref 150.0–400.0)
RBC: 4.57 Mil/uL (ref 4.22–5.81)
RDW: 13.4 % (ref 11.5–15.5)
WBC: 5.9 10*3/uL (ref 4.0–10.5)

## 2014-05-06 LAB — BASIC METABOLIC PANEL
BUN: 14 mg/dL (ref 6–23)
CALCIUM: 9.2 mg/dL (ref 8.4–10.5)
CO2: 28 meq/L (ref 19–32)
Chloride: 103 mEq/L (ref 96–112)
Creatinine, Ser: 0.7 mg/dL (ref 0.4–1.5)
GFR: 124.67 mL/min (ref >60.00)
GLUCOSE: 116 mg/dL — AB (ref 70–99)
Potassium: 4.8 mEq/L (ref 3.5–5.1)
Sodium: 138 mEq/L (ref 135–145)

## 2014-05-06 LAB — LIPID PANEL
CHOL/HDL RATIO: 5
Cholesterol: 193 mg/dL (ref 0–200)
HDL: 37 mg/dL — AB (ref >39.00)
LDL Cholesterol: 133 mg/dL — ABNORMAL HIGH (ref 0–99)
NONHDL: 156
TRIGLYCERIDES: 113 mg/dL (ref 0.0–149.0)
VLDL: 22.6 mg/dL (ref 0.0–40.0)

## 2014-05-06 LAB — TSH: TSH: 4.02 u[IU]/mL (ref 0.35–4.50)

## 2014-05-06 LAB — PSA: PSA: 1.69 ng/mL (ref 0.10–4.00)

## 2014-05-06 LAB — HIGH SENSITIVITY CRP: CRP HIGH SENSITIVITY: 1.02 mg/L (ref 0.000–5.000)

## 2014-05-07 ENCOUNTER — Other Ambulatory Visit (INDEPENDENT_AMBULATORY_CARE_PROVIDER_SITE_OTHER): Payer: Medicare Other

## 2014-05-07 DIAGNOSIS — R7309 Other abnormal glucose: Secondary | ICD-10-CM

## 2014-05-07 DIAGNOSIS — R7303 Prediabetes: Secondary | ICD-10-CM

## 2014-05-07 LAB — HEMOGLOBIN A1C: HEMOGLOBIN A1C: 6.1 % (ref 4.6–6.5)

## 2014-05-14 ENCOUNTER — Ambulatory Visit (INDEPENDENT_AMBULATORY_CARE_PROVIDER_SITE_OTHER): Payer: Medicare Other | Admitting: Internal Medicine

## 2014-05-14 ENCOUNTER — Encounter: Payer: Self-pay | Admitting: Internal Medicine

## 2014-05-14 VITALS — BP 178/80 | Temp 98.5°F | Ht 61.75 in | Wt 156.2 lb

## 2014-05-14 DIAGNOSIS — Z0000 Encounter for general adult medical examination without abnormal findings: Secondary | ICD-10-CM

## 2014-05-14 DIAGNOSIS — IMO0001 Reserved for inherently not codable concepts without codable children: Secondary | ICD-10-CM

## 2014-05-14 DIAGNOSIS — E786 Lipoprotein deficiency: Secondary | ICD-10-CM

## 2014-05-14 DIAGNOSIS — R7301 Impaired fasting glucose: Secondary | ICD-10-CM | POA: Diagnosis not present

## 2014-05-14 DIAGNOSIS — R030 Elevated blood-pressure reading, without diagnosis of hypertension: Secondary | ICD-10-CM

## 2014-05-14 DIAGNOSIS — I10 Essential (primary) hypertension: Secondary | ICD-10-CM

## 2014-05-14 MED ORDER — PNEUMOCOCCAL 13-VAL CONJ VACC IM SUSP: 0.5000 mL | Freq: Once | INTRAMUSCULAR | Status: DC

## 2014-05-14 NOTE — Progress Notes (Signed)
Pre visit review using our clinic review tool, if applicable. No additional management support is needed unless otherwise documented below in the visit note.  Chief Complaint  Patient presents with  . Medicare Wellness    meds  . Hypertension    HPI: Patient comes in today for Preventive Medicare wellness visit . And Chronic disease management  On medication for bp Bp had been steady and 130 - 137 over 70  And then got flu shot on Monday . And now up and down since  Then .    132  Higher  Then  150/ No cp sob syncope only taking once a day metoprolol instead of bid   Takes Claritin in season Has cut way down o drinking and eating cleaner  In past months  . No major injuries, ed visits ,hospitalizations , new medications since last visit. Had a fall and cracked ribs but better   Health Maintenance  Topic Date Due  . INFLUENZA VACCINE  12/08/2014  . COLONOSCOPY  08/22/2020  . TETANUS/TDAP  03/19/2023  . PNEUMOCOCCAL POLYSACCHARIDE VACCINE AGE 48 AND OVER  Completed  . ZOSTAVAX  Completed   Health Maintenance Review LIFESTYLE:  Exercise:  Walking  A lot  Tobacco/ETS:  no Alcohol: one per day.  Sugar beverages: no Sleep: 10 - 6  Drug use: no Colonoscopy:    Hearing: ok   Vision:  No limitations at present . Last eye check UTD  Safety:  Has smoke detector and wears seat belts.  No firearms. No excess sun exposure. Sees dentist regularly.  Falls:  [place]  Planting and feel over skipped on wet clay rib no cv neur sx .   Advance directive :  Reviewed  Has one.  Memory: Felt to be good  , no concern from her or her family.  Depression: No anhedonia unusual crying or depressive symptoms  Nutrition: Eats well balanced diet; adequate calcium and vitamin D. No swallowing chewing problems.  Wife eating healthy .  Injury: no major injuries in the last six months.  Other healthcare providers:  Reviewed today .  Social:  Lives with spouse married. No pets.    Preventive parameters: up-to-date  Reviewed   ADLS:   There are no problems or need for assistance  driving, feeding, obtaining food, dressing, toileting and bathing, managing money using phone. he is independent.     ROS:  GEN/ HEENT: No fever, significant weight changes sweats headaches vision problems hearing changes, CV/ PULM; No chest pain shortness of breath cough, syncope,edema  change in exercise tolerance. GI /GU: No adominal pain, vomiting, change in bowel habits. No blood in the stool. No significant GU symptoms. SKIN/HEME: ,no acute skin rashes suspicious lesions or bleeding. No lymphadenopathy, nodules, masses.  NEURO/ PSYCH:  No neurologic signs such as weakness numbness. No depression anxiety. IMM/ Allergy: No unusual infections.  Allergy .   REST of 12 system review negative except as per HPI   Past Medical History  Diagnosis Date  . Allergic rhinitis   . Hypertension   . History of chicken pox   . Detached retina   . Anxiety   . Heart murmur     has had one when a child and checked out ok  never? had an echo    Family History  Problem Relation Age of Onset  . Arthritis Mother   . Hypertension Mother   . Arthritis Father   . Hypertension Father     History  Social History  . Marital Status: Married    Spouse Name: N/A    Number of Children: N/A  . Years of Education: N/A   Social History Main Topics  . Smoking status: Never Smoker   . Smokeless tobacco: None  . Alcohol Use: 0.0 oz/week    14-21 Cans of beer per week  . Drug Use: No  . Sexual Activity: None   Other Topics Concern  . 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 aspirin EC 81 MG tablet Take 81 mg by mouth every other day.  . b complex vitamins tablet Take 1 tablet by  mouth daily.    Marland Kitchen lisinopril (PRINIVIL,ZESTRIL) 20 MG tablet TAKE ONE TABLET BY MOUTH ONE TIME DAILY  . loratadine (CLARITIN) 10 MG tablet Take 10 mg by mouth daily.    . metoprolol tartrate (LOPRESSOR) 25 MG tablet TAKE ONE TABLET BY MOUTH TWICE DAILY  . MULTIPLE VITAMIN PO Take by mouth.    . Olopatadine HCl (PATANASE) 0.6 % SOLN by Nasal route.    . pneumococcal 13-valent conjugate vaccine (PREVNAR 13) SUSP injection Inject 0.5 mLs into the muscle once.    EXAM:  BP 178/80 mmHg  Temp(Src) 98.5 F (36.9 C) (Oral)  Ht 5' 1.75" (1.568 m)  Wt 156 lb 3.2 oz (70.852 kg)  BMI 28.82 kg/m2  Body mass index is 28.82 kg/(m^2). Wt Readings from Last 3 Encounters:  05/14/14 156 lb 3.2 oz (70.852 kg)  03/18/13 150 lb (68.04 kg)  02/17/12 150 lb (68.04 kg)    Physical Exam: Vital signs reviewed repeat bp about 168/80 range  QIH:KVQQ is a well-developed well-nourished alert cooperative   who appears stated age in no acute distress.  HEENT: normocephalic atraumatic , Eyes: PERRL EOM's full, conjunctiva clear, Nares: paten,t no deformity discharge or tenderness., Ears: no deformity EAC's clear TMs with normal landmarks. Mouth: clear OP, no lesions, edema.  Moist mucous membranes. Dentition in adequate repair. NECK: supple without masses, thyromegaly or bruits. CHEST/PULM:  Clear to auscultation and percussion breath sounds equal no wheeze , rales or rhonchi. No chest wall deformities or tenderness. CV: PMI is nondisplaced, S1 S2 no gallops, murmurs, rubs. Peripheral pulses are full without delay.No JVD .  ABDOMEN: Bowel sounds normal nontender  No guard or rebound, no hepato splenomegal no CVA tenderness.   Extremtities:  No clubbing cyanosis or edema, no acute joint swelling or redness no focal atrophy NEURO:  Oriented x3, cranial nerves 3-12 appear to be intact, no obvious focal weakness,gait within normal limits no abnormal reflexes or asymmetrical SKIN: No acute rashes normal turgor, color,  no bruising or petechiae. PSYCH: Oriented, good eye contact, no obvious depression anxiety, cognition and judgment appear normal. LN: no cervical axillary inguinal adenopathy No noted deficits in memory, attention, and speech.   Lab Results  Component Value Date   WBC 5.9 05/06/2014   HGB 14.9 05/06/2014   HCT 44.9 05/06/2014   PLT 282.0 05/06/2014   GLUCOSE 116* 05/06/2014   CHOL 193 05/06/2014   TRIG 113.0 05/06/2014   HDL 37.00* 05/06/2014   LDLCALC 133* 05/06/2014   ALT 27 05/06/2014   AST 23 05/06/2014   NA 138 05/06/2014   K 4.8 05/06/2014   CL  103 05/06/2014   CREATININE 0.7 05/06/2014   BUN 14 05/06/2014   CO2 28 05/06/2014   TSH 4.02 05/06/2014   PSA 1.69 05/06/2014   HGBA1C 6.1 05/07/2014    ASSESSMENT AND PLAN:  Discussed the following assessment and plan:  Visit for preventive health examination  Medicare annual wellness visit, subsequent  HTN, white coat  Fasting hyperglycemia  Low HDL (under 40)  Essential hypertension - reviewed impotance of documenting control will send in readings amb monitroing usually not reimbursed take lopresser bid  Counseled regarding healthy nutrition, exercise, sleep, injury prevention, calcium vit d and healthy weight . Disc counsled intervnentions   metabolic parameters  Risk  Patient Care Team: Burnis Medin, MD as PCP - [facility], [place] (Dermatology)  Patient Instructions   Take blood pressure readings twice a day for 7-10 days and then periodically .To ensure below 140/90   .Send in readings       metorolol is best take twice a day   Can take 12. 5 twice a day or 25 twice a day  Otherwise would change to   Extended release  Yearly flu vaccine  As you are doing.  Blood sugar is still borderline  Up  Continue exercise  Weight loss   Healthy lifestyle includes : At least 150 minutes of exercise weeks  , weight at healthy levels, which is usually   BMI 19-25. Avoid trans fats and  processed foods;  Increase fresh fruits and veges to 5 servings per day. And avoid sweet beverages including tea and juice. Mediterranean diet with olive oil and nuts have been noted to be heart and brain healthy . Avoid tobacco products . Limit  alcohol to  7 per week for women and 14 servings for men.  Get adequate sleep . Wear seat belts . Don't text and drive .      Why follow it? Research shows. . Those who follow the Mediterranean diet have a reduced risk of heart disease  . The diet is associated with a reduced incidence of Parkinson's and Alzheimer's diseases . People following the diet may have longer life expectancies and lower rates of chronic diseases  . The Dietary Guidelines for Americans recommends the Mediterranean diet as an eating plan to promote health and prevent disease  What Is the Mediterranean Diet?  . Healthy eating plan based on typical foods and recipes of Mediterranean-style cooking . The diet is primarily a plant based diet; these foods should make up a majority of meals   Starches - Plant based foods should make up a majority of meals - They are an important sources of vitamins, minerals, energy, antioxidants, and fiber - Choose whole grains, foods high in fiber and minimally processed items  - Typical grain sources include wheat, oats, barley, corn, brown rice, bulgar, farro, millet, polenta, couscous  - Various types of beans include chickpeas, lentils, fava beans, black beans, white beans   Fruits  Veggies - Large quantities of antioxidant rich fruits & veggies; 6 or more servings  - Vegetables can be eaten raw or lightly drizzled with oil and cooked  - Vegetables common to the traditional Mediterranean Diet include: artichokes, arugula, beets, broccoli, brussel sprouts, cabbage, carrots, celery, collard greens, cucumbers, eggplant, kale, leeks, lemons, lettuce, mushrooms, okra, onions, peas, peppers, potatoes, pumpkin, radishes, rutabaga, shallots, spinach,  sweet potatoes, turnips, zucchini - Fruits common to the Mediterranean Diet include: apples, apricots, avocados, cherries, clementines, dates, figs, grapefruits, grapes, melons, nectarines,  oranges, peaches, pears, pomegranates, strawberries, tangerines  Fats - Replace butter and margarine with healthy oils, such as olive oil, canola oil, and tahini  - Limit nuts to no more than a handful a day  - Nuts include walnuts, almonds, pecans, pistachios, pine nuts  - Limit or avoid candied, honey roasted or heavily salted nuts - Olives are central to the Mediterranean diet - can be eaten whole or used in a variety of dishes   Meats Protein - Limiting red meat: no more than a few times a month - When eating red meat: choose lean cuts and keep the portion to the size of deck of cards - Eggs: approx. 0 to 4 times a week  - Fish and lean poultry: at least 2 a week  - Healthy protein sources include, chicken, [place], lean beef, lamb - Increase intake of seafood such as tuna, salmon, trout, mackerel, shrimp, scallops - Avoid or limit high fat processed meats such as sausage and bacon  Dairy - Include moderate amounts of low fat dairy products  - Focus on healthy dairy such as fat free yogurt, skim milk, low or reduced fat cheese - Limit dairy products higher in fat such as whole or 2% milk, cheese, ice cream  Alcohol - Moderate amounts of red wine is ok  - No more than 5 oz daily for women (all ages) and men older than age 45  - No more than 10 oz of wine daily for men younger than 54  Other - Limit sweets and other desserts  - Use herbs and spices instead of salt to flavor foods  - Herbs and spices common to the traditional Mediterranean Diet include: basil, bay leaves, chives, cloves, cumin, fennel, garlic, lavender, marjoram, mint, oregano, parsley, pepper, rosemary, sage, savory, sumac, tarragon, thyme   It's not just a diet, it's a lifestyle:  . The Mediterranean diet includes lifestyle factors  typical of those in the region  . Foods, drinks and meals are best eaten with others and savored . Daily physical activity is important for overall good health . This could be strenuous exercise like running and aerobics . This could also be more leisurely activities such as walking, housework, yard-work, or taking the stairs . Moderation is the key; a balanced and healthy diet accommodates most foods and drinks . Consider portion sizes and frequency of consumption of certain foods   Meal Ideas & Options:  . Breakfast:  o Whole wheat toast or whole wheat English muffins with peanut butter & hard boiled egg o Steel cut oats topped with apples & cinnamon and skim milk  o Fresh fruit: banana, strawberries, melon, berries, peaches  o Smoothies: strawberries, bananas, greek yogurt, peanut butter o Low fat greek yogurt with blueberries and granola  o Egg white omelet with spinach and mushrooms o Breakfast couscous: whole wheat couscous, apricots, skim milk, cranberries  . Sandwiches:  o Hummus and grilled vegetables (peppers, zucchini, squash) on whole wheat bread   o Grilled chicken on whole wheat pita with lettuce, tomatoes, cucumbers or tzatziki  o Tuna salad on whole wheat bread: tuna salad made with greek yogurt, olives, red peppers, capers, green onions o Garlic rosemary lamb pita: lamb sauted with garlic, rosemary, salt & pepper; add lettuce, cucumber, greek yogurt to pita - flavor with lemon juice and black pepper  . Seafood:  o Mediterranean grilled salmon, seasoned with garlic, basil, parsley, lemon juice and black pepper o Shrimp, lemon, and spinach whole-grain pasta salad  made with low fat greek yogurt  o Seared scallops with lemon orzo  o Seared tuna steaks seasoned salt, pepper, coriander topped with tomato mixture of olives, tomatoes, olive oil, minced garlic, parsley, green onions and cappers  . Meats:  o Herbed greek chicken salad with kalamata olives, cucumber, feta  o Red  bell peppers stuffed with spinach, bulgur, lean ground beef (or lentils) & topped with feta   o Kebabs: skewers of chicken, tomatoes, onions, zucchini, squash  o [place] burgers: made with red onions, mint, dill, lemon juice, feta cheese topped with roasted red peppers . Vegetarian o Cucumber salad: cucumbers, artichoke hearts, celery, red onion, feta cheese, tossed in olive oil & lemon juice  o Hummus and whole grain pita points with a greek salad (lettuce, tomato, feta, olives, cucumbers, red onion) o Lentil soup with celery, carrots made with vegetable broth, garlic, salt and pepper  o Tabouli salad: parsley, bulgur, mint, scallions, cucumbers, tomato, radishes, lemon juice, olive oil, salt and pepper.         Standley Brooking. Panosh M.D.

## 2014-05-14 NOTE — Patient Instructions (Addendum)
Take blood pressure readings twice a day for 7-10 days and then periodically .To ensure below 140/90   .Send in readings       metorolol is best take twice a day   Can take 12. 5 twice a day or 25 twice a day  Otherwise would change to   Extended release  Yearly flu vaccine  As you are doing.  Blood sugar is still borderline  Up  Continue exercise  Weight loss   Healthy lifestyle includes : At least 150 minutes of exercise weeks  , weight at healthy levels, which is usually   BMI 19-25. Avoid trans fats and processed foods;  Increase fresh fruits and veges to 5 servings per day. And avoid sweet beverages including tea and juice. Mediterranean diet with olive oil and nuts have been noted to be heart and brain healthy . Avoid tobacco products . Limit  alcohol to  7 per week for women and 14 servings for men.  Get adequate sleep . Wear seat belts . Don't text and drive .      Why follow it? Research shows. . Those who follow the Mediterranean diet have a reduced risk of heart disease  . The diet is associated with a reduced incidence of Parkinson's and Alzheimer's diseases . People following the diet may have longer life expectancies and lower rates of chronic diseases  . The Dietary Guidelines for Americans recommends the Mediterranean diet as an eating plan to promote health and prevent disease  What Is the Mediterranean Diet?  . Healthy eating plan based on typical foods and recipes of Mediterranean-style cooking . The diet is primarily a plant based diet; these foods should make up a majority of meals   Starches - Plant based foods should make up a majority of meals - They are an important sources of vitamins, minerals, energy, antioxidants, and fiber - Choose whole grains, foods high in fiber and minimally processed items  - Typical grain sources include wheat, oats, barley, corn, brown rice, bulgar, farro, millet, polenta, couscous  - Various types of beans include chickpeas, lentils,  fava beans, black beans, white beans   Fruits  Veggies - Large quantities of antioxidant rich fruits & veggies; 6 or more servings  - Vegetables can be eaten raw or lightly drizzled with oil and cooked  - Vegetables common to the traditional Mediterranean Diet include: artichokes, arugula, beets, broccoli, brussel sprouts, cabbage, carrots, celery, collard greens, cucumbers, eggplant, kale, leeks, lemons, lettuce, mushrooms, okra, onions, peas, peppers, potatoes, pumpkin, radishes, rutabaga, shallots, spinach, sweet potatoes, turnips, zucchini - Fruits common to the Mediterranean Diet include: apples, apricots, avocados, cherries, clementines, dates, figs, grapefruits, grapes, melons, nectarines, oranges, peaches, pears, pomegranates, strawberries, tangerines  Fats - Replace butter and margarine with healthy oils, such as olive oil, canola oil, and tahini  - Limit nuts to no more than a handful a day  - Nuts include walnuts, almonds, pecans, pistachios, pine nuts  - Limit or avoid candied, honey roasted or heavily salted nuts - Olives are central to the [employer] - can be eaten whole or used in a variety of dishes   Meats Protein - Limiting red meat: no more than a few times a month - When eating red meat: choose lean cuts and keep the portion to the size of deck of cards - Eggs: approx. 0 to 4 times a week  - Fish and lean poultry: at least 2 a week  - Healthy protein sources include,  chicken, [place], lean beef, lamb - Increase intake of seafood such as tuna, salmon, trout, mackerel, shrimp, scallops - Avoid or limit high fat processed meats such as sausage and bacon  Dairy - Include moderate amounts of low fat dairy products  - Focus on healthy dairy such as fat free yogurt, skim milk, low or reduced fat cheese - Limit dairy products higher in fat such as whole or 2% milk, cheese, ice cream  Alcohol - Moderate amounts of red wine is ok  - No more than 5 oz daily for women (all ages)  and men older than age 92  - No more than 10 oz of wine daily for men younger than 61  Other - Limit sweets and other desserts  - Use herbs and spices instead of salt to flavor foods  - Herbs and spices common to the traditional Mediterranean Diet include: basil, bay leaves, chives, cloves, cumin, fennel, garlic, lavender, marjoram, mint, oregano, parsley, pepper, rosemary, sage, savory, sumac, tarragon, thyme   It's not just a diet, it's a lifestyle:  . The Mediterranean diet includes lifestyle factors typical of those in the region  . Foods, drinks and meals are best eaten with others and savored . Daily physical activity is important for overall good health . This could be strenuous exercise like running and aerobics . This could also be more leisurely activities such as walking, housework, yard-work, or taking the stairs . Moderation is the key; a balanced and healthy diet accommodates most foods and drinks . Consider portion sizes and frequency of consumption of certain foods   Meal Ideas & Options:  . Breakfast:  o Whole wheat toast or whole wheat English muffins with peanut butter & hard boiled egg o Steel cut oats topped with apples & cinnamon and skim milk  o Fresh fruit: banana, strawberries, melon, berries, peaches  o Smoothies: strawberries, bananas, greek yogurt, peanut butter o Low fat greek yogurt with blueberries and granola  o Egg white omelet with spinach and mushrooms o Breakfast couscous: whole wheat couscous, apricots, skim milk, cranberries  . Sandwiches:  o Hummus and grilled vegetables (peppers, zucchini, squash) on whole wheat bread   o Grilled chicken on whole wheat pita with lettuce, tomatoes, cucumbers or tzatziki  o Tuna salad on whole wheat bread: tuna salad made with greek yogurt, olives, red peppers, capers, green onions o Garlic rosemary lamb pita: lamb sauted with garlic, rosemary, salt & pepper; add lettuce, cucumber, greek yogurt to pita - flavor with  lemon juice and black pepper  . Seafood:  o Mediterranean grilled salmon, seasoned with garlic, basil, parsley, lemon juice and black pepper o Shrimp, lemon, and spinach whole-grain pasta salad made with low fat greek yogurt  o Seared scallops with lemon orzo  o Seared tuna steaks seasoned salt, pepper, coriander topped with tomato mixture of olives, tomatoes, olive oil, minced garlic, parsley, green onions and cappers  . Meats:  o Herbed greek chicken salad with kalamata olives, cucumber, feta  o Red bell peppers stuffed with spinach, bulgur, lean ground beef (or lentils) & topped with feta   o Kebabs: skewers of chicken, tomatoes, onions, zucchini, squash  o [place] burgers: made with red onions, mint, dill, lemon juice, feta cheese topped with roasted red peppers . Vegetarian o Cucumber salad: cucumbers, artichoke hearts, celery, red onion, feta cheese, tossed in olive oil & lemon juice  o Hummus and whole grain pita points with a greek salad (lettuce, tomato, feta, olives, cucumbers, red  onion) o Lentil soup with celery, carrots made with vegetable broth, garlic, salt and pepper  o Tabouli salad: parsley, bulgur, mint, scallions, cucumbers, tomato, radishes, lemon juice, olive oil, salt and pepper.

## 2014-05-17 ENCOUNTER — Encounter: Payer: Self-pay | Admitting: Internal Medicine

## 2014-05-17 DIAGNOSIS — E786 Lipoprotein deficiency: Secondary | ICD-10-CM | POA: Insufficient documentation

## 2014-05-17 DIAGNOSIS — I10 Essential (primary) hypertension: Secondary | ICD-10-CM | POA: Insufficient documentation

## 2014-05-17 DIAGNOSIS — R7301 Impaired fasting glucose: Secondary | ICD-10-CM | POA: Insufficient documentation

## 2014-05-21 ENCOUNTER — Telehealth: Payer: Self-pay | Admitting: Internal Medicine

## 2014-05-21 NOTE — Telephone Encounter (Signed)
emmi mailed  °

## 2014-07-07 ENCOUNTER — Other Ambulatory Visit: Payer: Self-pay | Admitting: Internal Medicine

## 2014-07-07 NOTE — Telephone Encounter (Signed)
Sent to the pharmacy by e-scribe. 

## 2014-07-10 ENCOUNTER — Other Ambulatory Visit: Payer: Self-pay | Admitting: Internal Medicine

## 2014-07-10 NOTE — Telephone Encounter (Signed)
Sent to the pharmacy by e-scribe. 

## 2014-07-11 ENCOUNTER — Other Ambulatory Visit: Payer: Self-pay | Admitting: Family Medicine

## 2014-07-11 MED ORDER — METOPROLOL SUCCINATE ER 50 MG PO TB24: 50.0000 mg | ORAL_TABLET | Freq: Every day | ORAL | Status: DC

## 2014-08-15 ENCOUNTER — Other Ambulatory Visit: Payer: Self-pay | Admitting: Family Medicine

## 2014-10-13 ENCOUNTER — Telehealth: Payer: Self-pay | Admitting: Internal Medicine

## 2014-10-15 ENCOUNTER — Other Ambulatory Visit: Payer: Self-pay | Admitting: Family Medicine

## 2014-10-15 MED ORDER — METOPROLOL TARTRATE 25 MG PO TABS: 25.0000 mg | ORAL_TABLET | Freq: Two times a day (BID) | ORAL | Status: DC

## 2014-11-21 DIAGNOSIS — H02839 Dermatochalasis of unspecified eye, unspecified eyelid: Secondary | ICD-10-CM | POA: Diagnosis not present

## 2014-11-21 DIAGNOSIS — H43813 Vitreous degeneration, bilateral: Secondary | ICD-10-CM | POA: Diagnosis not present

## 2014-11-21 DIAGNOSIS — H1013 Acute atopic conjunctivitis, bilateral: Secondary | ICD-10-CM | POA: Diagnosis not present

## 2014-11-21 DIAGNOSIS — H31002 Unspecified chorioretinal scars, left eye: Secondary | ICD-10-CM | POA: Diagnosis not present

## 2014-11-21 DIAGNOSIS — Z961 Presence of intraocular lens: Secondary | ICD-10-CM | POA: Diagnosis not present

## 2014-11-21 DIAGNOSIS — H01009 Unspecified blepharitis unspecified eye, unspecified eyelid: Secondary | ICD-10-CM | POA: Diagnosis not present

## 2014-11-21 DIAGNOSIS — H33002 Unspecified retinal detachment with retinal break, left eye: Secondary | ICD-10-CM | POA: Diagnosis not present

## 2015-04-10 ENCOUNTER — Other Ambulatory Visit: Payer: Self-pay | Admitting: Internal Medicine

## 2015-04-10 MED ORDER — METOPROLOL TARTRATE 25 MG PO TABS: 25.0000 mg | ORAL_TABLET | Freq: Two times a day (BID) | ORAL | Status: DC

## 2015-04-10 NOTE — Telephone Encounter (Signed)
Sent to the pharmacy by e-scribe. 

## 2015-06-10 NOTE — Telephone Encounter (Signed)
ERROR

## 2015-07-10 ENCOUNTER — Other Ambulatory Visit: Payer: Self-pay | Admitting: Internal Medicine

## 2015-07-13 ENCOUNTER — Telehealth: Payer: Self-pay | Admitting: Family Medicine

## 2015-07-13 NOTE — Telephone Encounter (Signed)
Sent to the pharmacy by e-scribe for 90 days.  Message sent to scheduling to help him make fasting medicare wellness exam.

## 2015-07-13 NOTE — Telephone Encounter (Signed)
Pt is past due for medicare wellness exam.  Please help him to make a fasting appointment.  Lab work to be done the same day.  Thanks!

## 2015-07-13 NOTE — Telephone Encounter (Signed)
lmom for pt to call back

## 2015-07-14 NOTE — Telephone Encounter (Signed)
Pt has been scheduled.  °

## 2015-10-14 ENCOUNTER — Other Ambulatory Visit (INDEPENDENT_AMBULATORY_CARE_PROVIDER_SITE_OTHER): Payer: Medicare Other

## 2015-10-14 DIAGNOSIS — Z0000 Encounter for general adult medical examination without abnormal findings: Secondary | ICD-10-CM | POA: Diagnosis not present

## 2015-10-14 LAB — BASIC METABOLIC PANEL
BUN: 15 mg/dL (ref 6–23)
CALCIUM: 9.2 mg/dL (ref 8.4–10.5)
CHLORIDE: 101 meq/L (ref 96–112)
CO2: 28 meq/L (ref 19–32)
CREATININE: 0.75 mg/dL (ref 0.40–1.50)
GFR: 109 mL/min (ref >60.00)
GLUCOSE: 104 mg/dL — AB (ref 70–99)
Potassium: 4.8 mEq/L (ref 3.5–5.1)
Sodium: 138 mEq/L (ref 135–145)

## 2015-10-14 LAB — TSH: TSH: 4.42 u[IU]/mL (ref 0.35–4.50)

## 2015-10-14 LAB — HEPATIC FUNCTION PANEL
ALBUMIN: 4.4 g/dL (ref 3.5–5.2)
ALT: 19 U/L (ref 0–53)
AST: 19 U/L (ref 0–37)
Alkaline Phosphatase: 53 U/L (ref 39–117)
BILIRUBIN DIRECT: 0.1 mg/dL (ref 0.0–0.3)
TOTAL PROTEIN: 6.9 g/dL (ref 6.0–8.3)
Total Bilirubin: 0.8 mg/dL (ref 0.2–1.2)

## 2015-10-14 LAB — CBC WITH DIFFERENTIAL/PLATELET
BASOS PCT: 0.4 % (ref 0.0–3.0)
Basophils Absolute: 0 10*3/uL (ref 0.0–0.1)
EOS ABS: 0.1 10*3/uL (ref 0.0–0.7)
Eosinophils Relative: 2.3 % (ref 0.0–5.0)
HEMATOCRIT: 43.1 % (ref 39.0–52.0)
Hemoglobin: 14.4 g/dL (ref 13.0–17.0)
LYMPHS ABS: 1.3 10*3/uL (ref 0.7–4.0)
LYMPHS PCT: 19.9 % (ref 12.0–46.0)
MCHC: 33.5 g/dL (ref 30.0–36.0)
MCV: 100.6 fl — AB (ref 78.0–100.0)
Monocytes Absolute: 1 10*3/uL (ref 0.1–1.0)
Monocytes Relative: 15.4 % — ABNORMAL HIGH (ref 3.0–12.0)
NEUTROS ABS: 4.1 10*3/uL (ref 1.4–7.7)
NEUTROS PCT: 62 % (ref 43.0–77.0)
PLATELETS: 275 10*3/uL (ref 150.0–400.0)
RBC: 4.28 Mil/uL (ref 4.22–5.81)
RDW: 13.3 % (ref 11.5–15.5)
WBC: 6.6 10*3/uL (ref 4.0–10.5)

## 2015-10-14 LAB — LIPID PANEL
Cholesterol: 188 mg/dL (ref 0–200)
HDL: 37.8 mg/dL — AB (ref >39.00)
LDL Cholesterol: 119 mg/dL — ABNORMAL HIGH (ref 0–99)
NonHDL: 150.4
TRIGLYCERIDES: 155 mg/dL — AB (ref 0.0–149.0)
Total CHOL/HDL Ratio: 5
VLDL: 31 mg/dL (ref 0.0–40.0)

## 2015-10-14 LAB — PSA: PSA: 1.9 ng/mL (ref 0.10–4.00)

## 2015-10-20 NOTE — Progress Notes (Signed)
Chief Complaint  Patient presents with  . Medicare Wellness    HPI: Samuel Pearson 70 y.o. comes in today for Preventive Medicare wellness visit  And Chronic disease management and disc of  Health status. He is generally well and has a list of his history. He brings in home blood pressure monitor to check it against ours. He also brings a log of some of his readings. Most of his blood pressure readings are in the 130s occasional 140 tends to go up with what he calls itching chiggers. 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  BP: wc effect but mostly when he comes to our facility but not the dentist or his eye doctor. Allergies :  A lot  " Had chiggers"  And rash .   Goes up. A day or 2  CTM  almost daily .  Still feels itching  On back    Xlera...saline  And  Sugar subs .  Helps .   Likes this .    Hearing good  Minor  Issues   Muscle stiffness and weak feeling in am   Food allergies   Off and on   Feels bad gi .    Eating  Healthy .  [employer] .  Itchy skin since  had chiggers   Itching  ... Skin he states itches most of the time he has bumps and lesions on his back that he feels may been chiggers and things that he needs to pick out. No other treatment noted. Wish could lose weight   Health Maintenance  Topic Date Due  . Hepatitis C Screening  1946-04-20  . INFLUENZA VACCINE  12/08/2015  . COLONOSCOPY  08/22/2020  . TETANUS/TDAP  03/19/2023  . ZOSTAVAX  Completed  . PNA vac Low Risk Adult  Completed   Health Maintenance Review LIFESTYLE:  T AD down to 1 beer per day  To lose weight or  Small wine   Red .  Sugar beverages: Sleep: Can grow a  Beard in a week.   MEDICARE DOCUMENT QUESTIONS  TO SCAN   Hearing: g  Vision:  No limitations at present . Last eye check UTD  Safety:  Has smoke detector and wears seat belts.   No firearms. No excess sun exposure. Sees dentist regularly.  Falls: n  Advance directive :  Reviewed  Has one.  Memory: Felt to be good  , no concern from her or her family.  Depression: No anhedonia unusual crying or depressive symptoms  Nutrition: Eats well balanced diet; adequate calcium and vitamin D. No swallowing chewing problems.  Injury: no major injuries in the last six months.  Other healthcare providers:  Reviewed today .  Social:  Lives with spouse married.   Preventive parameters: up-to-date  Reviewed   ADLS:   There are no problems or need for assistance  driving, feeding, obtaining food, dressing, toileting and bathing, managing money using phone.  is independent.     ROS:  GEN/ HEENT: No fever, significant weight changes sweats headaches vision problems hearing changes, CV/ PULM; No chest pain shortness of breath cough, syncope,edema  change in exercise tolerance. GI /GU: No adominal pain, vomiting, change in bowel habits. No blood in the stool. No significant GU symptoms. SKIN/HEME: ,no  suspicious lesions or bleeding. No lymphadenopathy, nodules,  masses. SEE  ABOVE ITCHING RASH NEURO/ PSYCH:  No neurologic signs such as weakness numbness. No depression anxiety. IMM/ Allergy: No unusual infections.  Allergy .   REST of 12 system review negative except as per HPI   Past Medical History  Diagnosis Date  . Allergic rhinitis   . Hypertension   . History of chicken pox   . Detached retina   . Anxiety   . Heart murmur     has had one when a child and checked out ok  never? had an echo    Family History  Problem Relation Age of Onset  . Arthritis Mother   . Hypertension Mother   . Arthritis Father   . Hypertension Father     Social History   Social History  . Marital Status: Married    Spouse Name: N/A  . Number of Children: N/A  . Years of Education: N/A   Social History Main Topics  . Smoking status: Never Smoker   . Smokeless tobacco: None   . Alcohol Use: 0.0 oz/week    14-21 Cans of beer per week  . Drug Use: No  . Sexual Activity: Not Asked   Other Topics Concern  . None   Social History Narrative   Retired Film/video editor from [place]   Married   Some Caffeine   Helps take care of grandkids   recently moved residence     Outpatient Encounter Prescriptions as of 10/21/2015  Medication Sig  . aspirin EC 81 MG tablet Take 81 mg by mouth every other day.  . b complex vitamins tablet Take 1 tablet by mouth daily.    . chlorpheniramine (CHLOR-TRIMETON) 4 MG tablet Take 4 mg by mouth 2 (two) times daily as needed for allergies.  . diphenhydrAMINE (BENADRYL) 25 MG tablet Take 25 mg by mouth every 6 (six) hours as needed.  Marland Kitchen ketotifen (ALAWAY) 0.025 % ophthalmic solution 1 drop 2 (two) times daily.  Marland Kitchen lisinopril (PRINIVIL,ZESTRIL) 20 MG tablet TAKE ONE TABLET BY MOUTH ONE TIME DAILY  . metoprolol tartrate (LOPRESSOR) 25 MG tablet TAKE 1 TABLET (25 MG TOTAL) BY MOUTH 2 (TWO) TIMES DAILY.  Marland Kitchen pneumococcal 13-valent conjugate vaccine (PREVNAR 13) SUSP injection Inject 0.5 mLs into the muscle once.  . [DISCONTINUED] loratadine (CLARITIN) 10 MG tablet Take 10 mg by mouth daily.    . [DISCONTINUED] MULTIPLE VITAMIN PO Take by mouth.    . [DISCONTINUED] Olopatadine HCl (PATANASE) 0.6 % SOLN by Nasal route.     No facility-administered encounter medications on file as of 10/21/2015.    EXAM:  BP 176/72 mmHg  Temp(Src) 98.3 F (36.8 C) (Oral)  Ht 5' 1.5" (1.562 m)  Wt 155 lb 3.2 oz (70.398 kg)  BMI 28.85 kg/m2  Body mass index is 28.85 kg/(m^2).  Physical Exam: Vital signs reviewed [id number] is a well-developed well-nourished alert cooperative   who appears stated age in no acute distress.  HEENT: normocephalic atraumatic , Eyes: PERRL EOM's full, conjunctiva clear, Nares: paten,t no deformity discharge or tenderness., Ears: no deformity EAC's clear TMs with normal landmarks. Mouth: clear OP, no lesions,  edema.  Moist mucous membranes. Dentition in adequate repair. NECK: supple without masses, thyromegaly or bruits. CHEST/PULM:  Clear to auscultation and percussion breath sounds equal no wheeze , rales or rhonchi. No chest wall deformities or tenderness. CV: PMI is nondisplaced, S1 S2 no gallops, There is a 2 and fro murmur at the right upper sternal border  and S1-S2 with question diastolic runoff murmur that doesn't radiate. His PMI is normal dynamic. There are no bruits., rubs. Peripheral pulses are full without delay.No JVD .  ABDOMEN: Bowel sounds normal nontender  No guard or rebound, no hepato splenomegal no CVA tenderness.   Extremtities:  No clubbing cyanosis or edema, no acute joint swelling or redness no focal atrophy NEURO:  Oriented x3, cranial nerves 3-12 appear to be intact, no obvious focal weakness,gait within normal limits no abnormal reflexes or asymmetrical SKIN: , no bruising or petechiae. On his back he has various lesions some small scaly pink some steroid seborrheic keratotic 2 with large pores that is red around it. No other acute lesions. No evidence of burrows or vesicles  PSYCH: Oriented, good eye contact, no obvious depression anxiety, cognition and judgment appear normal.nl axiety for him  Rectall declined LN: no cervical axillary inguinal adenopathy No noted deficits in memory, attention, and speech. Blood pressure monitor reading was 182 which correlates with our office cuff reading.  Lab Results  Component Value Date   WBC 6.6 10/14/2015   HGB 14.4 10/14/2015   HCT 43.1 10/14/2015   PLT 275.0 10/14/2015   GLUCOSE 104* 10/14/2015   CHOL 188 10/14/2015   TRIG 155.0* 10/14/2015   HDL 37.80* 10/14/2015   LDLCALC 119* 10/14/2015   ALT 19 10/14/2015   AST 19 10/14/2015   NA 138 10/14/2015   K 4.8 10/14/2015   CL 101 10/14/2015   CREATININE 0.75 10/14/2015   BUN 15 10/14/2015   CO2 28 10/14/2015   TSH 4.42 10/14/2015   PSA 1.90 10/14/2015   HGBA1C 6.1  05/07/2014   BP Readings from Last 3 Encounters:  10/21/15 176/72  05/14/14 178/80  03/18/13 154/90   Wt Readings from Last 3 Encounters:  10/21/15 155 lb 3.2 oz (70.398 kg)  05/14/14 156 lb 3.2 oz (70.852 kg)  03/18/13 150 lb (68.04 kg)  ekg nsr butComputer reading consider old anterior infarct but no cues but some late anterior forces.  ASSESSMENT AND PLAN:  Discussed the following assessment and plan:  Visit for preventive health examination  Fasting hyperglycemia  Medicare annual wellness visit, subsequent - Plan: EKG 12-Lead  HTN, white coat  Essential hypertension - Plan: EKG 12-Lead  Low HDL (under 40)  Heart murmur - Plan: EKG 12-Lead  Abnormal RBC  Itching - Plan: Ambulatory referral to Dermatology  Skin lesion - Multiple area of itching some look like SKs possible AK's other abnormal changes. Refer for dermatology evaluation. Told he doesn't have chiggers - Plan: Ambulatory referral to Dermatology Reviewed above problems questions answered shared decision making. Patient Care Team: Burnis Medin, MD as PCP - [place], [place] (Dermatology)  Patient Instructions  Continue BP monitoring at home  Stay on same medication.   Can get HEP c screen  At next labs or shen convenient  Not high risk .  Will arrange dermatology referral .  Stop scratching   Will make skin itchier.  Let us know if you decide to let cardiologist listen to your murmur or get an echocardiogram as discussed .     Health Maintenance, Male A healthy lifestyle and preventative care can promote health and wellness.  Maintain regular health, dental, and eye exams.  Eat a healthy diet. Foods like vegetables, fruits, whole grains, low-fat dairy products, and lean protein foods contain the nutrients you need and are low in calories. Decrease your intake of foods high in solid fats, added  sugars, and salt. Get information about a proper diet from your health care  provider, if necessary.  Regular physical exercise is one of the most important things you can do for your health. Most adults should get at least 150 minutes of moderate-intensity exercise (any activity that increases your heart rate and causes you to sweat) each week. In addition, most adults need muscle-strengthening exercises on 2 or more days a week.   Maintain a healthy weight. The body mass index (BMI) is a screening tool to identify possible weight problems. It provides an estimate of body fat based on height and weight. Your health care provider can find your BMI and can help you achieve or maintain a healthy weight. For males 20 years and older:  A BMI below 18.5 is considered underweight.  A BMI of 18.5 to 24.9 is normal.  A BMI of 25 to 29.9 is considered overweight.  A BMI of 30 and above is considered obese.  Maintain normal blood lipids and cholesterol by exercising and minimizing your intake of saturated fat. Eat a balanced diet with plenty of fruits and vegetables. Blood tests for lipids and cholesterol should begin at age 43 and be repeated every 5 years. If your lipid or cholesterol levels are high, you are over age 29, or you are at high risk for heart disease, you may need your cholesterol levels checked more frequently.Ongoing high lipid and cholesterol levels should be treated with medicines if diet and exercise are not working.  If you smoke, find out from your health care provider how to quit. If you do not use tobacco, do not start.  Lung cancer screening is recommended for adults aged 11-80 years who are at high risk for developing lung cancer because of a history of smoking. A yearly low-dose CT scan of the lungs is recommended for people who have at least a 30-pack-year history of smoking and are current smokers or have quit within the past 15 years. A pack year of smoking is smoking an average of 1 pack of cigarettes a day for 1 year (for example, a 30-pack-year  history of smoking could mean smoking 1 pack a day for 30 years or 2 packs a day for 15 years). Yearly screening should continue until the smoker has stopped smoking for at least 15 years. Yearly screening should be stopped for people who develop a health problem that would prevent them from having lung cancer treatment.  If you choose to drink alcohol, do not have more than 2 drinks per day. One drink is considered to be 12 oz (360 mL) of beer, 5 oz (150 mL) of wine, or 1.5 oz (45 mL) of liquor.  Avoid the use of street drugs. Do not share needles with anyone. Ask for help if you need support or instructions about stopping the use of drugs.  High blood pressure causes heart disease and increases the risk of stroke. High blood pressure is more likely to develop in:  People who have blood pressure in the end of the normal range (100-139/85-89 mm Hg).  People who are overweight or obese.  People who are African American.  If you are 65-41 years of age, have your blood pressure checked every 3-5 years. If you are 29 years of age or older, have your blood pressure checked every year. You should have your blood pressure measured twice--once when you are at a hospital or clinic, and once when you are not at a hospital or clinic. Record  the average of the two measurements. To check your blood pressure when you are not at a hospital or clinic, you can use:  An automated blood pressure machine at a pharmacy.  A home blood pressure monitor.  If you are 71-11 years old, ask your health care provider if you should take aspirin to prevent heart disease.  Diabetes screening involves taking a blood sample to check your fasting blood sugar level. This should be done once every 3 years after age 34 if you are at a normal weight and without risk factors for diabetes. Testing should be considered at a younger age or be carried out more frequently if you are overweight and have at least 1 risk factor for  diabetes.  Colorectal cancer can be detected and often prevented. Most routine colorectal cancer screening begins at the age of 53 and continues through age 64. However, your health care provider may recommend screening at an earlier age if you have risk factors for colon cancer. On a yearly basis, your health care provider may provide home test kits to check for hidden blood in the stool. A small camera at the end of a tube may be used to directly examine the colon (sigmoidoscopy or colonoscopy) to detect the earliest forms of colorectal cancer. Talk to your health care provider about this at age 41 when routine screening begins. A direct exam of the colon should be repeated every 5-10 years through age 44, unless early forms of precancerous polyps or small growths are found.  People who are at an increased risk for hepatitis B should be screened for this virus. You are considered at high risk for hepatitis B if:  You were born in a country where hepatitis B occurs often. Talk with your health care provider about which countries are considered high risk.  Your parents were born in a high-risk country and you have not received a shot to protect against hepatitis B (hepatitis B vaccine).  You have HIV or AIDS.  You use needles to inject street drugs.  You live with, or have sex with, someone who has hepatitis B.  You are a man who has sex with other men (MSM).  You get hemodialysis treatment.  You take certain medicines for conditions like cancer, organ transplantation, and autoimmune conditions.  Hepatitis C blood testing is recommended for all people born from [birth year] through 1966 and any individual with known risk factors for hepatitis C.  Healthy men should no longer receive prostate-specific antigen (PSA) blood tests as part of routine cancer screening. Talk to your health care provider about prostate cancer screening.  Testicular cancer screening is not recommended for adolescents or  adult males who have no symptoms. Screening includes self-exam, a health care provider exam, and other screening tests. Consult with your health care provider about any symptoms you have or any concerns you have about testicular cancer.  Practice safe sex. Use condoms and avoid high-risk sexual practices to reduce the spread of sexually transmitted infections (STIs).  You should be screened for STIs, including gonorrhea and chlamydia if:  You are sexually active and are younger than 24 years.  You are older than 24 years, and your health care provider tells you that you are at risk for this type of infection.  Your sexual activity has changed since you were last screened, and you are at an increased risk for chlamydia or gonorrhea. Ask your health care provider if you are at risk.  If you are at  risk of being infected with HIV, it is recommended that you take a prescription medicine daily to prevent HIV infection. This is called pre-exposure prophylaxis (PrEP). You are considered at risk if:  You are a man who has sex with other men (MSM).  You are a heterosexual man who is sexually active with multiple partners.  You take drugs by injection.  You are sexually active with a partner who has HIV.  Talk with your health care provider about whether you are at high risk of being infected with HIV. If you choose to begin PrEP, you should first be tested for HIV. You should then be tested every 3 months for as long as you are taking PrEP.  Use sunscreen. Apply sunscreen liberally and repeatedly throughout the day. You should seek shade when your shadow is shorter than you. Protect yourself by wearing long sleeves, pants, a wide-brimmed hat, and sunglasses year round whenever you are outdoors.  Tell your health care provider of new moles or changes in moles, especially if there is a change in shape or color. Also, tell your health care provider if a mole is larger than the size of a pencil  eraser.  A one-time screening for abdominal aortic aneurysm (AAA) and surgical repair of large AAAs by ultrasound is recommended for men aged 40-75 years who are current or former smokers.  Stay current with your vaccines (immunizations).   This information is not intended to replace advice given to you by your health care provider. Make sure you discuss any questions you have with your health care provider.   Document Released: 10/22/2007 Document Revised: 05/16/2014 Document Reviewed: 09/20/2010 Elsevier Interactive Patient Education 2016 [place] K. Panosh M.D.

## 2015-10-21 ENCOUNTER — Ambulatory Visit (INDEPENDENT_AMBULATORY_CARE_PROVIDER_SITE_OTHER): Payer: Medicare Other | Admitting: Internal Medicine

## 2015-10-21 ENCOUNTER — Encounter: Payer: Self-pay | Admitting: Internal Medicine

## 2015-10-21 VITALS — BP 176/72 | Temp 98.3°F | Ht 61.5 in | Wt 155.2 lb

## 2015-10-21 DIAGNOSIS — R030 Elevated blood-pressure reading, without diagnosis of hypertension: Secondary | ICD-10-CM

## 2015-10-21 DIAGNOSIS — L989 Disorder of the skin and subcutaneous tissue, unspecified: Secondary | ICD-10-CM

## 2015-10-21 DIAGNOSIS — I10 Essential (primary) hypertension: Secondary | ICD-10-CM

## 2015-10-21 DIAGNOSIS — R718 Other abnormality of red blood cells: Secondary | ICD-10-CM

## 2015-10-21 DIAGNOSIS — IMO0001 Reserved for inherently not codable concepts without codable children: Secondary | ICD-10-CM

## 2015-10-21 DIAGNOSIS — Z0000 Encounter for general adult medical examination without abnormal findings: Secondary | ICD-10-CM | POA: Diagnosis not present

## 2015-10-21 DIAGNOSIS — R011 Cardiac murmur, unspecified: Secondary | ICD-10-CM | POA: Diagnosis not present

## 2015-10-21 DIAGNOSIS — E786 Lipoprotein deficiency: Secondary | ICD-10-CM | POA: Diagnosis not present

## 2015-10-21 DIAGNOSIS — R7301 Impaired fasting glucose: Secondary | ICD-10-CM | POA: Diagnosis not present

## 2015-10-21 DIAGNOSIS — L299 Pruritus, unspecified: Secondary | ICD-10-CM

## 2015-10-21 NOTE — Assessment & Plan Note (Signed)
His blood pressure home monitor correlates with the office consistent with white coat hypertension and baseline hypertension. Continue on current medication contact us if elevation at home.

## 2015-10-21 NOTE — Assessment & Plan Note (Signed)
Patient feels that it was related to alcohol in the past isn't drinking as much alcohol and MCV is improved.

## 2015-10-21 NOTE — Assessment & Plan Note (Signed)
Right sided upper to and fro sounds like venous home however I do not think diastole is clear over the cardiac area. Concern for an AI murmur.  He feels well has no symptoms we discussed getting a cardiology to listen or get an echocardiogram. He doesn't want to do this at this time but will think about it. If he gets any cardiovascular symptoms you return for the evaluation.

## 2015-10-21 NOTE — Patient Instructions (Addendum)
Continue BP monitoring at home  Stay on same medication.   Can get HEP c screen  At next labs or shen convenient  Not high risk .  Will arrange dermatology referral .  Stop scratching   Will make skin itchier.  Let us know if you decide to let cardiologist listen to your murmur or get an echocardiogram as discussed .     Health Maintenance, Male A healthy lifestyle and preventative care can promote health and wellness.  Maintain regular health, dental, and eye exams.  Eat a healthy diet. Foods like vegetables, fruits, whole grains, low-fat dairy products, and lean protein foods contain the nutrients you need and are low in calories. Decrease your intake of foods high in solid fats, added sugars, and salt. Get information about a proper diet from your health care provider, if necessary.  Regular physical exercise is one of the most important things you can do for your health. Most adults should get at least 150 minutes of moderate-intensity exercise (any activity that increases your heart rate and causes you to sweat) each week. In addition, most adults need muscle-strengthening exercises on 2 or more days a week.   Maintain a healthy weight. The body mass index (BMI) is a screening tool to identify possible weight problems. It provides an estimate of body fat based on height and weight. Your health care provider can find your BMI and can help you achieve or maintain a healthy weight. For males 20 years and older:  A BMI below 18.5 is considered underweight.  A BMI of 18.5 to 24.9 is normal.  A BMI of 25 to 29.9 is considered overweight.  A BMI of 30 and above is considered obese.  Maintain normal blood lipids and cholesterol by exercising and minimizing your intake of saturated fat. Eat a balanced diet with plenty of fruits and vegetables. Blood tests for lipids and cholesterol should begin at age 57 and be repeated every 5 years. If your lipid or cholesterol levels are high, you are  over age 11, or you are at high risk for heart disease, you may need your cholesterol levels checked more frequently.Ongoing high lipid and cholesterol levels should be treated with medicines if diet and exercise are not working.  If you smoke, find out from your health care provider how to quit. If you do not use tobacco, do not start.  Lung cancer screening is recommended for adults aged 29-80 years who are at high risk for developing lung cancer because of a history of smoking. A yearly low-dose CT scan of the lungs is recommended for people who have at least a 30-pack-year history of smoking and are current smokers or have quit within the past 15 years. A pack year of smoking is smoking an average of 1 pack of cigarettes a day for 1 year (for example, a 30-pack-year history of smoking could mean smoking 1 pack a day for 30 years or 2 packs a day for 15 years). Yearly screening should continue until the smoker has stopped smoking for at least 15 years. Yearly screening should be stopped for people who develop a health problem that would prevent them from having lung cancer treatment.  If you choose to drink alcohol, do not have more than 2 drinks per day. One drink is considered to be 12 oz (360 mL) of beer, 5 oz (150 mL) of wine, or 1.5 oz (45 mL) of liquor.  Avoid the use of street drugs. Do not share needles  with anyone. Ask for help if you need support or instructions about stopping the use of drugs.  High blood pressure causes heart disease and increases the risk of stroke. High blood pressure is more likely to develop in:  People who have blood pressure in the end of the normal range (100-139/85-89 mm Hg).  People who are overweight or obese.  People who are African American.  If you are 95-8 years of age, have your blood pressure checked every 3-5 years. If you are 81 years of age or older, have your blood pressure checked every year. You should have your blood pressure measured  twice--once when you are at a hospital or clinic, and once when you are not at a hospital or clinic. Record the average of the two measurements. To check your blood pressure when you are not at a hospital or clinic, you can use:  An automated blood pressure machine at a pharmacy.  A home blood pressure monitor.  If you are 53-95 years old, ask your health care provider if you should take aspirin to prevent heart disease.  Diabetes screening involves taking a blood sample to check your fasting blood sugar level. This should be done once every 3 years after age 29 if you are at a normal weight and without risk factors for diabetes. Testing should be considered at a younger age or be carried out more frequently if you are overweight and have at least 1 risk factor for diabetes.  Colorectal cancer can be detected and often prevented. Most routine colorectal cancer screening begins at the age of 79 and continues through age 44. However, your health care provider may recommend screening at an earlier age if you have risk factors for colon cancer. On a yearly basis, your health care provider may provide home test kits to check for hidden blood in the stool. A small camera at the end of a tube may be used to directly examine the colon (sigmoidoscopy or colonoscopy) to detect the earliest forms of colorectal cancer. Talk to your health care provider about this at age 60 when routine screening begins. A direct exam of the colon should be repeated every 5-10 years through age 64, unless early forms of precancerous polyps or small growths are found.  People who are at an increased risk for hepatitis B should be screened for this virus. You are considered at high risk for hepatitis B if:  You were born in a country where hepatitis B occurs often. Talk with your health care provider about which countries are considered high risk.  Your parents were born in a high-risk country and you have not received a shot to  protect against hepatitis B (hepatitis B vaccine).  You have HIV or AIDS.  You use needles to inject street drugs.  You live with, or have sex with, someone who has hepatitis B.  You are a man who has sex with other men (MSM).  You get hemodialysis treatment.  You take certain medicines for conditions like cancer, organ transplantation, and autoimmune conditions.  Hepatitis C blood testing is recommended for all people born from [birth year] through 1965 and any individual with known risk factors for hepatitis C.  Healthy men should no longer receive prostate-specific antigen (PSA) blood tests as part of routine cancer screening. Talk to your health care provider about prostate cancer screening.  Testicular cancer screening is not recommended for adolescents or adult males who have no symptoms. Screening includes self-exam, a health care provider  exam, and other screening tests. Consult with your health care provider about any symptoms you have or any concerns you have about testicular cancer.  Practice safe sex. Use condoms and avoid high-risk sexual practices to reduce the spread of sexually transmitted infections (STIs).  You should be screened for STIs, including gonorrhea and chlamydia if:  You are sexually active and are younger than 24 years.  You are older than 24 years, and your health care provider tells you that you are at risk for this type of infection.  Your sexual activity has changed since you were last screened, and you are at an increased risk for chlamydia or gonorrhea. Ask your health care provider if you are at risk.  If you are at risk of being infected with HIV, it is recommended that you take a prescription medicine daily to prevent HIV infection. This is called pre-exposure prophylaxis (PrEP). You are considered at risk if:  You are a man who has sex with other men (MSM).  You are a heterosexual man who is sexually active with multiple partners.  You take drugs by  injection.  You are sexually active with a partner who has HIV.  Talk with your health care provider about whether you are at high risk of being infected with HIV. If you choose to begin PrEP, you should first be tested for HIV. You should then be tested every 3 months for as long as you are taking PrEP.  Use sunscreen. Apply sunscreen liberally and repeatedly throughout the day. You should seek shade when your shadow is shorter than you. Protect yourself by wearing long sleeves, pants, a wide-brimmed hat, and sunglasses year round whenever you are outdoors.  Tell your health care provider of new moles or changes in moles, especially if there is a change in shape or color. Also, tell your health care provider if a mole is larger than the size of a pencil eraser.  A one-time screening for abdominal aortic aneurysm (AAA) and surgical repair of large AAAs by ultrasound is recommended for men aged 50-75 years who are current or former smokers.  Stay current with your vaccines (immunizations).   This information is not intended to replace advice given to you by your health care provider. Make sure you discuss any questions you have with your health care provider.   Document Released: 10/22/2007 Document Revised: 05/16/2014 Document Reviewed: 09/20/2010 Elsevier Interactive Patient Education [employer].

## 2015-10-22 ENCOUNTER — Telehealth: Payer: Self-pay | Admitting: Internal Medicine

## 2015-10-22 ENCOUNTER — Other Ambulatory Visit: Payer: Self-pay | Admitting: Internal Medicine

## 2015-10-22 NOTE — Telephone Encounter (Signed)
Rx refill sent to pharmacy. 

## 2015-10-22 NOTE — Telephone Encounter (Signed)
Pt needs refills on lisinopril#90 and metoprolol 25 mg #180 for 90 day supply w/refills send to [facility]

## 2015-10-23 MED ORDER — METOPROLOL TARTRATE 25 MG PO TABS: ORAL_TABLET | ORAL | Status: DC

## 2015-10-23 MED ORDER — LISINOPRIL 20 MG PO TABS: 20.0000 mg | ORAL_TABLET | Freq: Every day | ORAL | Status: DC

## 2015-10-23 NOTE — Telephone Encounter (Signed)
Sent to the pharmacy by e-scribe. 

## 2016-02-10 DIAGNOSIS — D224 Melanocytic nevi of scalp and neck: Secondary | ICD-10-CM | POA: Diagnosis not present

## 2016-02-10 DIAGNOSIS — Z85828 Personal history of other malignant neoplasm of skin: Secondary | ICD-10-CM | POA: Diagnosis not present

## 2016-02-10 DIAGNOSIS — L814 Other melanin hyperpigmentation: Secondary | ICD-10-CM | POA: Diagnosis not present

## 2016-02-10 DIAGNOSIS — D485 Neoplasm of uncertain behavior of skin: Secondary | ICD-10-CM | POA: Diagnosis not present

## 2016-02-10 DIAGNOSIS — L570 Actinic keratosis: Secondary | ICD-10-CM | POA: Diagnosis not present

## 2016-02-10 DIAGNOSIS — L821 Other seborrheic keratosis: Secondary | ICD-10-CM | POA: Diagnosis not present

## 2016-02-10 DIAGNOSIS — L281 Prurigo nodularis: Secondary | ICD-10-CM | POA: Diagnosis not present

## 2016-03-25 DIAGNOSIS — L900 Lichen sclerosus et atrophicus: Secondary | ICD-10-CM | POA: Diagnosis not present

## 2016-03-25 DIAGNOSIS — D485 Neoplasm of uncertain behavior of skin: Secondary | ICD-10-CM | POA: Diagnosis not present

## 2016-07-05 DIAGNOSIS — I10 Essential (primary) hypertension: Secondary | ICD-10-CM | POA: Diagnosis not present

## 2016-07-05 DIAGNOSIS — Z9842 Cataract extraction status, left eye: Secondary | ICD-10-CM | POA: Diagnosis not present

## 2016-07-05 DIAGNOSIS — Z79899 Other long term (current) drug therapy: Secondary | ICD-10-CM | POA: Diagnosis not present

## 2016-07-05 DIAGNOSIS — H33312 Horseshoe tear of retina without detachment, left eye: Secondary | ICD-10-CM | POA: Diagnosis not present

## 2016-07-05 DIAGNOSIS — H59812 Chorioretinal scars after surgery for detachment, left eye: Secondary | ICD-10-CM | POA: Diagnosis not present

## 2016-07-05 DIAGNOSIS — Z7982 Long term (current) use of aspirin: Secondary | ICD-10-CM | POA: Diagnosis not present

## 2016-07-05 DIAGNOSIS — Z9889 Other specified postprocedural states: Secondary | ICD-10-CM | POA: Diagnosis not present

## 2016-07-05 DIAGNOSIS — H43813 Vitreous degeneration, bilateral: Secondary | ICD-10-CM | POA: Diagnosis not present

## 2016-07-05 DIAGNOSIS — Z961 Presence of intraocular lens: Secondary | ICD-10-CM | POA: Diagnosis not present

## 2016-07-05 DIAGNOSIS — H02839 Dermatochalasis of unspecified eye, unspecified eyelid: Secondary | ICD-10-CM | POA: Diagnosis not present

## 2016-07-05 DIAGNOSIS — H1045 Other chronic allergic conjunctivitis: Secondary | ICD-10-CM | POA: Diagnosis not present

## 2016-07-05 DIAGNOSIS — Z9841 Cataract extraction status, right eye: Secondary | ICD-10-CM | POA: Diagnosis not present

## 2016-10-17 ENCOUNTER — Other Ambulatory Visit: Payer: Medicare Other

## 2016-10-20 NOTE — Progress Notes (Signed)
Chief Complaint  Patient presents with  . Annual Exam    HPI: Samuel Pearson 71 y.o. comes in today for Preventive Medicare exam  visit . And Chronic disease management Since last visit.    bp 142 and 137   Ranges  And in 6 mos  Ago  Walking and losing weight    [place] [employer] .    Felt good cause stock market did well  Arvella Nigh some weight back from other issues  No se of med needs refills    Had skin cancer   r neck    And scca .     Skin margins   .  Had  October skin check.   [place] pac?   Stress from health of mom pacer   And  unreponsive   snf in ?[place] cost etc     Allergy season bad .   Before last few weeks.   So not as active  As before .  Stayed nside   Antihistamine benadryl in ly thing that works  But makes drowsy . Gets food intolerance  When peak season also  ? What to do ? \x clear       Health Maintenance  Topic Date Due  . Hepatitis C Screening  1945/07/28  . INFLUENZA VACCINE  12/07/2016  . COLONOSCOPY  08/22/2020  . TETANUS/TDAP  03/19/2023  . PNA vac Low Risk Adult  Completed   Health Maintenance Review LIFESTYLE:  Exercise:  Walking when can  Tobacco/ETS: no Alcohol:  Cut back   Beer red wine . About 14 .    Sugar beverages: no  Sleep:    Good sleeper   8  Drug use: no hh of 2 Retired  Visual merchandiser  ...       Hearing: ok  Vision:  No limitations at present . Last eye check UTD cataract surgery   Safety:  Has smoke detector and wears seat belts.  No firearms. No excess sun exposure. Sees dentist regularly.  Falls:  no  Advance directive :  Reviewed  Has one.  Memory: Felt to be good  , no concern from her or her family.  Depression: No anhedonia unusual crying or depressive symptoms doesnt want to live past 80 cause of  Quality of life he has seen then   Nutrition: Eats well balanced diet; adequate calcium and vitamin D. No swallowing chewing problems.  Injury: no major injuries in the last six  months.  Other healthcare providers:  Reviewed today .  Social:  Lives with spouse married.   Preventive parameters: up-to-date  Reviewed   ADLS:   There are no problems or need for assistance  driving, feeding, obtaining food, dressing, toileting and bathing, managing money using phone. he is independent.  ROS:  GEN/ HEENT: No fever, significant weight changes sweats headaches vision problems hearing changes, CV/ PULM; No chest pain shortness of breath cough, syncope,edema  change in exercise tolerance. GI /GU: No adominal pain, vomiting, change in bowel habits. No blood in the stool. No significant GU symptoms. SKIN/HEME: ,no acute skin rashes suspicious lesions or bleeding has cyst lesino are on back irritated  / derm left it alone . No lymphadenopathy, nodules, masses.  NEURO/ PSYCH:  No neurologic signs such as weakness numbness. No depression anxiety. IMM/ Allergy: No unusual infections.  Allergy .   REST of 12 system review negative except as per HPI   Past Medical  History:  Diagnosis Date  . Allergic rhinitis   . Anxiety   . Detached retina   . Heart murmur    has had one when a child and checked out ok  never? had an echo  . History of chicken pox   . Hx of nonmelanoma skin cancer   . Hypertension     Family History  Problem Relation Age of Onset  . Arthritis Mother   . Hypertension Mother   . Arthritis Father   . Hypertension Father     Social History   Social History  . Marital status: Married    Spouse name: N/A  . Number of children: N/A  . Years of education: N/A   Social History Main Topics  . Smoking status: Never Smoker  . Smokeless tobacco: Never Used  . Alcohol use 0.0 oz/week    14 - 21 Cans of beer per week  . Drug use: No  . Sexual activity: Not Asked   Other Topics Concern  . None   Social History Narrative   Retired Film/video editor from [place]   Married   Some Caffeine   Helps take care of grandkids   recently  moved residence     Outpatient Encounter Prescriptions as of 10/21/2016  Medication Sig  . aspirin EC 81 MG tablet Take 81 mg by mouth every other day.  . b complex vitamins tablet Take 1 tablet by mouth daily.    . chlorpheniramine (CHLOR-TRIMETON) 4 MG tablet Take 4 mg by mouth 2 (two) times daily as needed for allergies.  . diphenhydrAMINE (BENADRYL) 25 MG tablet Take 25 mg by mouth every 6 (six) hours as needed.  Marland Kitchen ketotifen (ALAWAY) 0.025 % ophthalmic solution 1 drop 2 (two) times daily.  Marland Kitchen lisinopril (PRINIVIL,ZESTRIL) 20 MG tablet Take 1 tablet (20 mg total) by mouth daily.  . metoprolol tartrate (LOPRESSOR) 25 MG tablet TAKE 1 TABLET (25 MG TOTAL) BY MOUTH 2 (TWO) TIMES DAILY.  Marland Kitchen pneumococcal 13-valent conjugate vaccine (PREVNAR 13) SUSP injection Inject 0.5 mLs into the muscle once.  . [DISCONTINUED] lisinopril (PRINIVIL,ZESTRIL) 20 MG tablet Take 1 tablet (20 mg total) by mouth daily.  . [DISCONTINUED] metoprolol tartrate (LOPRESSOR) 25 MG tablet TAKE 1 TABLET (25 MG TOTAL) BY MOUTH 2 (TWO) TIMES DAILY.   No facility-administered encounter medications on file as of 10/21/2016.     EXAM:  BP 138/80 (BP Location: Left Arm, Patient Position: Sitting, Cuff Size: Normal)   Pulse 79   Temp 98.4 F (36.9 C) (Oral)   Ht 5' 1.5" (1.562 m)   Wt 152 lb 8 oz (69.2 kg)   BMI 28.35 kg/m   Body mass index is 28.35 kg/m.  Physical Exam: Vital signs reviewed QPR:FFMB is a well-developed well-nourished alert cooperative   who appears stated age in no acute distress.  HEENT: normocephalic atraumatic , Eyes: PERRL EOM's full, conjunctiva clear, Nares: paten,t no deformity discharge or tenderness., Ears: no deformity EAC's clear TMs with normal landmarks. Mouth: clear OP, no lesions, edema.  Moist mucous membranes. Dentition in adequate repair. NECK: supple without masses, thyromegaly or bruits. CHEST/PULM:  Clear to auscultation and percussion breath sounds equal no wheeze , rales or  rhonchi. No chest wall deformities or tenderness. CV: PMI is nondisplaced, S1 S2 no gallops, , rubs.  To and for? m apex left and continuous? RUChest  changes with position  Peripheral pulses are full without delay.No JVD .  ABDOMEN: Bowel sounds normal  nontender  No guard or rebound, no hepato splenomegal no CVA tenderness.   Extremtities:  No clubbing cyanosis or edema, no acute joint swelling or redness no focal atrophy NEURO:  Oriented x3, cranial nerves 3-12 appear to be intact, no obvious focal weakness,gait within normal limits no abnormal reflexes or asymmetrical SKIN: No acute rashes normal turgor, color, no bruising or petechiae.  Well healed scar right neck   And sk on back with cyst lesion  noted  PSYCH: Oriented, good eye contact, no obvious depression normal  anxiety, cognition and judgment appear normal. LN: no cervical axillaryl adenopathy No noted deficits in memory, attention, and speech.    BP Readings from Last 3 Encounters:  10/21/16 138/80  10/21/15 (!) 176/72  05/14/14 (!) 178/80   Wt Readings from Last 3 Encounters:  10/21/16 152 lb 8 oz (69.2 kg)  10/21/15 155 lb 3.2 oz (70.4 kg)  05/14/14 156 lb 3.2 oz (70.9 kg)     ASSESSMENT AND PLAN:  Discussed the following assessment and plan:  Visit for preventive health examination  Essential hypertension - Plan: Basic metabolic panel, Hemoglobin A1c, Hepatic function panel, Lipid panel, CBC with Differential/Platelet  Fasting hyperglycemia - Plan: Basic metabolic panel, Hemoglobin A1c, Hepatic function panel, Lipid panel, CBC with Differential/Platelet  Low HDL (under 40) - Plan: Basic metabolic panel, Hemoglobin A1c, Hepatic function panel, Lipid panel, CBC with Differential/Platelet  Heart murmur - Plan: Basic metabolic panel, Hemoglobin A1c, Hepatic function panel, Lipid panel, CBC with Differential/Platelet  Abnormal RBC - Plan: Basic metabolic panel, Hemoglobin A1c, Hepatic function panel, Lipid panel, CBC  with Differential/Platelet  Medication management - Plan: Basic metabolic panel, Hemoglobin A1c, Hepatic function panel, Lipid panel, CBC with Differential/Platelet  Need for hepatitis C screening test - birth cohort  - Plan: Hepatitis C antibody  Problem related to lifestyle  code for birth cohort risk  - Plan: Hepatitis C antibody  Colon cancer screening - risk bene do cologuard average risk   Seasonal allergic rhinitis, unspecified trigger - reports food  issues too disc allergy  eval reasonable if fails  other methods and interfereing with nl ADLS uncertain cause murmurs  ? Venous hum but left sided too and for is different  Certainly has no sx at this time  But consider echo to delineate Disc wellness  And may or may not do this  Patient Care Team: Shirah Roseman, Standley Brooking, MD as PCP - [facility] [place], [place] (Dermatology)   Lab Results  Component Value Date   WBC 6.8 10/21/2016   HGB 15.2 10/21/2016   HCT 45.1 10/21/2016   PLT 280.0 10/21/2016   GLUCOSE 108 (H) 10/21/2016   CHOL 221 (H) 10/21/2016   TRIG 178.0 (H) 10/21/2016   HDL 45.50 10/21/2016   LDLCALC 140 (H) 10/21/2016   ALT 21 10/21/2016   AST 19 10/21/2016   NA 141 10/21/2016   K 4.8 10/21/2016   CL 103 10/21/2016   CREATININE 0.77 10/21/2016   BUN 16 10/21/2016   CO2 28 10/21/2016   TSH 4.42 10/14/2015   PSA 1.90 10/14/2015   HGBA1C 6.0 10/21/2016     Patient Instructions  Will notify you  of labs when available.  Nasal flonase  Or topical  cortisone.   May still help.  At least 2 weeks  Consider allergy consult  If  Still  Problematic.    cologuard   colonc screen . Let me know if you agree to an echocardiogram of your heart  To check out the murmurs I hear.   Continue lifestyle intervention healthy eating and exercise .  Healthy lifestyle includes : At least 150 minutes of exercise weeks  , weight at healthy levels, which is usually   BMI 19-25. Avoid trans fats and processed foods;   Increase fresh fruits and veges to 5 servings per day. And avoid sweet beverages including tea and juice. Mediterranean diet with olive oil and nuts have been noted to be heart and brain healthy . Avoid tobacco products . Limit  alcohol to  7 per week for women and 14 servings for men.  Get adequate sleep . Wear seat belts . Don't text and drive .    Preventive Care 65 Years and Older, Male Preventive care refers to lifestyle choices and visits with your health care provider that can promote health and wellness. What does preventive care include?  A yearly physical exam. This is also called an annual well check.  Dental exams once or twice a year.  Routine eye exams. Ask your health care provider how often you should have your eyes checked.  Personal lifestyle choices, including: ? Daily care of your teeth and gums. ? Regular physical activity. ? Eating a healthy diet. ? Avoiding tobacco and drug use. ? Limiting alcohol use. ? Practicing safe sex. ? Taking low doses of aspirin every day. ? Taking vitamin and mineral supplements as recommended by your health care provider. What happens during an annual well check? The services and screenings done by your health care provider during your annual well check will depend on your age, overall health, lifestyle risk factors, and family history of disease. Counseling Your health care provider may ask you questions about your:  Alcohol use.  Tobacco use.  Drug use.  Emotional well-being.  Home and relationship well-being.  Sexual activity.  Eating habits.  History of falls.  Memory and ability to understand (cognition).  Work and work Statistician.  Screening You may have the following tests or measurements:  Height, weight, and BMI.  Blood pressure.  Lipid and cholesterol levels. These may be checked every 5 years, or more frequently if you are over 48 years old.  Skin check.  Lung cancer screening. You may have  this screening every year starting at age 44 if you have a 30-pack-year history of smoking and currently smoke or have quit within the past 15 years.  Fecal occult blood test (FOBT) of the stool. You may have this test every year starting at age 49.  Flexible sigmoidoscopy or colonoscopy. You may have a sigmoidoscopy every 5 years or a colonoscopy every 10 years starting at age 73.  Prostate cancer screening. Recommendations will vary depending on your family history and other risks.  Hepatitis C blood test.  Hepatitis B blood test.  Sexually transmitted disease (STD) testing.  Diabetes screening. This is done by checking your blood sugar (glucose) after you have not eaten for a while (fasting). You may have this done every 1-3 years.  Abdominal aortic aneurysm (AAA) screening. You may need this if you are a current or former smoker.  Osteoporosis. You may be screened starting at age 73 if you are at high risk.  Talk with your health care provider about your test results, treatment options, and if necessary, the need for more tests. Vaccines Your health care provider may recommend certain vaccines, such as:  Influenza vaccine. This is recommended every year.  Tetanus, diphtheria, and acellular pertussis (Tdap, Td) vaccine. You may  need a Td booster every 10 years.  Varicella vaccine. You may need this if you have not been vaccinated.  Zoster vaccine. You may need this after age 4.  Measles, mumps, and rubella (MMR) vaccine. You may need at least one dose of MMR if you were born in 1957 or later. You may also need a second dose.  Pneumococcal 13-valent conjugate (PCV13) vaccine. One dose is recommended after age 97.  Pneumococcal polysaccharide (PPSV23) vaccine. One dose is recommended after age 58.  Meningococcal vaccine. You may need this if you have certain conditions.  Hepatitis A vaccine. You may need this if you have certain conditions or if you travel or work in places  where you may be exposed to hepatitis A.  Hepatitis B vaccine. You may need this if you have certain conditions or if you travel or work in places where you may be exposed to hepatitis B.  Haemophilus influenzae type b (Hib) vaccine. You may need this if you have certain risk factors.  Talk to your health care provider about which screenings and vaccines you need and how often you need them. This information is not intended to replace advice given to you by your health care provider. Make sure you discuss any questions you have with your health care provider. Document Released: 05/22/2015 Document Revised: 01/13/2016 Document Reviewed: 02/24/2015 Elsevier Interactive Patient Education  2017 [place] K. Adom Schoeneck M.D.

## 2016-10-21 ENCOUNTER — Encounter: Payer: Self-pay | Admitting: Internal Medicine

## 2016-10-21 ENCOUNTER — Ambulatory Visit (INDEPENDENT_AMBULATORY_CARE_PROVIDER_SITE_OTHER): Payer: Medicare Other | Admitting: Internal Medicine

## 2016-10-21 VITALS — BP 138/80 | HR 79 | Temp 98.4°F | Ht 61.5 in | Wt 152.5 lb

## 2016-10-21 DIAGNOSIS — J302 Other seasonal allergic rhinitis: Secondary | ICD-10-CM

## 2016-10-21 DIAGNOSIS — E786 Lipoprotein deficiency: Secondary | ICD-10-CM

## 2016-10-21 DIAGNOSIS — Z79899 Other long term (current) drug therapy: Secondary | ICD-10-CM | POA: Diagnosis not present

## 2016-10-21 DIAGNOSIS — Z1211 Encounter for screening for malignant neoplasm of colon: Secondary | ICD-10-CM

## 2016-10-21 DIAGNOSIS — Z729 Problem related to lifestyle, unspecified: Secondary | ICD-10-CM

## 2016-10-21 DIAGNOSIS — Z1159 Encounter for screening for other viral diseases: Secondary | ICD-10-CM

## 2016-10-21 DIAGNOSIS — R011 Cardiac murmur, unspecified: Secondary | ICD-10-CM | POA: Diagnosis not present

## 2016-10-21 DIAGNOSIS — I10 Essential (primary) hypertension: Secondary | ICD-10-CM | POA: Diagnosis not present

## 2016-10-21 DIAGNOSIS — Z0000 Encounter for general adult medical examination without abnormal findings: Secondary | ICD-10-CM | POA: Diagnosis not present

## 2016-10-21 DIAGNOSIS — R718 Other abnormality of red blood cells: Secondary | ICD-10-CM | POA: Diagnosis not present

## 2016-10-21 DIAGNOSIS — R7301 Impaired fasting glucose: Secondary | ICD-10-CM

## 2016-10-21 LAB — BASIC METABOLIC PANEL
BUN: 16 mg/dL (ref 6–23)
CALCIUM: 9.9 mg/dL (ref 8.4–10.5)
CO2: 28 mEq/L (ref 19–32)
CREATININE: 0.77 mg/dL (ref 0.40–1.50)
Chloride: 103 mEq/L (ref 96–112)
GFR: 105.43 mL/min (ref >60.00)
Glucose, Bld: 108 mg/dL — ABNORMAL HIGH (ref 70–99)
Potassium: 4.8 mEq/L (ref 3.5–5.1)
Sodium: 141 mEq/L (ref 135–145)

## 2016-10-21 LAB — CBC WITH DIFFERENTIAL/PLATELET
BASOS PCT: 0.2 % (ref 0.0–3.0)
Basophils Absolute: 0 10*3/uL (ref 0.0–0.1)
EOS PCT: 1.9 % (ref 0.0–5.0)
Eosinophils Absolute: 0.1 10*3/uL (ref 0.0–0.7)
HEMATOCRIT: 45.1 % (ref 39.0–52.0)
Hemoglobin: 15.2 g/dL (ref 13.0–17.0)
LYMPHS PCT: 16.6 % (ref 12.0–46.0)
Lymphs Abs: 1.1 10*3/uL (ref 0.7–4.0)
MCHC: 33.7 g/dL (ref 30.0–36.0)
MCV: 106.7 fl — AB (ref 78.0–100.0)
MONOS PCT: 13.5 % — AB (ref 3.0–12.0)
Monocytes Absolute: 0.9 10*3/uL (ref 0.1–1.0)
NEUTROS ABS: 4.6 10*3/uL (ref 1.4–7.7)
Neutrophils Relative %: 67.8 % (ref 43.0–77.0)
PLATELETS: 280 10*3/uL (ref 150.0–400.0)
RBC: 4.22 Mil/uL (ref 4.22–5.81)
RDW: 14.2 % (ref 11.5–15.5)
WBC: 6.8 10*3/uL (ref 4.0–10.5)

## 2016-10-21 LAB — HEPATIC FUNCTION PANEL
ALBUMIN: 4.7 g/dL (ref 3.5–5.2)
ALT: 21 U/L (ref 0–53)
AST: 19 U/L (ref 0–37)
Alkaline Phosphatase: 54 U/L (ref 39–117)
BILIRUBIN TOTAL: 0.8 mg/dL (ref 0.2–1.2)
Bilirubin, Direct: 0.1 mg/dL (ref 0.0–0.3)
TOTAL PROTEIN: 7.4 g/dL (ref 6.0–8.3)

## 2016-10-21 LAB — HEMOGLOBIN A1C: Hgb A1c MFr Bld: 6 % (ref 4.6–6.5)

## 2016-10-21 LAB — LIPID PANEL
CHOLESTEROL: 221 mg/dL — AB (ref 0–200)
HDL: 45.5 mg/dL (ref >39.00)
LDL CALC: 140 mg/dL — AB (ref 0–99)
NonHDL: 175.33
TRIGLYCERIDES: 178 mg/dL — AB (ref 0.0–149.0)
Total CHOL/HDL Ratio: 5
VLDL: 35.6 mg/dL (ref 0.0–40.0)

## 2016-10-21 MED ORDER — METOPROLOL TARTRATE 25 MG PO TABS: ORAL_TABLET | ORAL | 3 refills | Status: DC

## 2016-10-21 MED ORDER — LISINOPRIL 20 MG PO TABS: 20.0000 mg | ORAL_TABLET | Freq: Every day | ORAL | 3 refills | Status: DC

## 2016-10-21 NOTE — Patient Instructions (Addendum)
Will notify you  of labs when available.  Nasal flonase  Or topical  cortisone.   May still help.  At least 2 weeks  Consider allergy consult  If  Still  Problematic.    cologuard   colonc screen . Let me know if you agree to an echocardiogram of your heart  To check out the murmurs I hear.   Continue lifestyle intervention healthy eating and exercise .  Healthy lifestyle includes : At least 150 minutes of exercise weeks  , weight at healthy levels, which is usually   BMI 19-25. Avoid trans fats and processed foods;  Increase fresh fruits and veges to 5 servings per day. And avoid sweet beverages including tea and juice. Mediterranean diet with olive oil and nuts have been noted to be heart and brain healthy . Avoid tobacco products . Limit  alcohol to  7 per week for women and 14 servings for men.  Get adequate sleep . Wear seat belts . Don't text and drive .    Preventive Care 22 Years and Older, Male Preventive care refers to lifestyle choices and visits with your health care provider that can promote health and wellness. What does preventive care include?  A yearly physical exam. This is also called an annual well check.  Dental exams once or twice a year.  Routine eye exams. Ask your health care provider how often you should have your eyes checked.  Personal lifestyle choices, including: ? Daily care of your teeth and gums. ? Regular physical activity. ? Eating a healthy diet. ? Avoiding tobacco and drug use. ? Limiting alcohol use. ? Practicing safe sex. ? Taking low doses of aspirin every day. ? Taking vitamin and mineral supplements as recommended by your health care provider. What happens during an annual well check? The services and screenings done by your health care provider during your annual well check will depend on your age, overall health, lifestyle risk factors, and family history of disease. Counseling Your health care provider may ask you questions about  your:  Alcohol use.  Tobacco use.  Drug use.  Emotional well-being.  Home and relationship well-being.  Sexual activity.  Eating habits.  History of falls.  Memory and ability to understand (cognition).  Work and work Statistician.  Screening You may have the following tests or measurements:  Height, weight, and BMI.  Blood pressure.  Lipid and cholesterol levels. These may be checked every 5 years, or more frequently if you are over 18 years old.  Skin check.  Lung cancer screening. You may have this screening every year starting at age 39 if you have a 30-pack-year history of smoking and currently smoke or have quit within the past 15 years.  Fecal occult blood test (FOBT) of the stool. You may have this test every year starting at age 72.  Flexible sigmoidoscopy or colonoscopy. You may have a sigmoidoscopy every 5 years or a colonoscopy every 10 years starting at age 72.  Prostate cancer screening. Recommendations will vary depending on your family history and other risks.  Hepatitis C blood test.  Hepatitis B blood test.  Sexually transmitted disease (STD) testing.  Diabetes screening. This is done by checking your blood sugar (glucose) after you have not eaten for a while (fasting). You may have this done every 1-3 years.  Abdominal aortic aneurysm (AAA) screening. You may need this if you are a current or former smoker.  Osteoporosis. You may be screened starting at age 72  if you are at high risk.  Talk with your health care provider about your test results, treatment options, and if necessary, the need for more tests. Vaccines Your health care provider may recommend certain vaccines, such as:  Influenza vaccine. This is recommended every year.  Tetanus, diphtheria, and acellular pertussis (Tdap, Td) vaccine. You may need a Td booster every 10 years.  Varicella vaccine. You may need this if you have not been vaccinated.  Zoster vaccine. You may need  this after age 33.  Measles, mumps, and rubella (MMR) vaccine. You may need at least one dose of MMR if you were born in 1957 or later. You may also need a second dose.  Pneumococcal 13-valent conjugate (PCV13) vaccine. One dose is recommended after age 29.  Pneumococcal polysaccharide (PPSV23) vaccine. One dose is recommended after age 33.  Meningococcal vaccine. You may need this if you have certain conditions.  Hepatitis A vaccine. You may need this if you have certain conditions or if you travel or work in places where you may be exposed to hepatitis A.  Hepatitis B vaccine. You may need this if you have certain conditions or if you travel or work in places where you may be exposed to hepatitis B.  Haemophilus influenzae type b (Hib) vaccine. You may need this if you have certain risk factors.  Talk to your health care provider about which screenings and vaccines you need and how often you need them. This information is not intended to replace advice given to you by your health care provider. Make sure you discuss any questions you have with your health care provider. Document Released: 05/22/2015 Document Revised: 01/13/2016 Document Reviewed: 02/24/2015 Elsevier Interactive Patient Education  2017 [employer].

## 2016-10-22 LAB — HEPATITIS C ANTIBODY: HCV Ab: NEGATIVE

## 2016-11-14 ENCOUNTER — Encounter: Payer: Self-pay | Admitting: Internal Medicine

## 2016-11-14 DIAGNOSIS — Z1212 Encounter for screening for malignant neoplasm of rectum: Secondary | ICD-10-CM | POA: Diagnosis not present

## 2016-11-14 DIAGNOSIS — Z1211 Encounter for screening for malignant neoplasm of colon: Secondary | ICD-10-CM | POA: Diagnosis not present

## 2016-11-14 LAB — COLOGUARD

## 2016-11-18 LAB — HM COLONOSCOPY

## 2016-11-23 ENCOUNTER — Encounter: Payer: Self-pay | Admitting: Internal Medicine

## 2017-02-14 DIAGNOSIS — D225 Melanocytic nevi of trunk: Secondary | ICD-10-CM | POA: Diagnosis not present

## 2017-02-14 DIAGNOSIS — L570 Actinic keratosis: Secondary | ICD-10-CM | POA: Diagnosis not present

## 2017-02-14 DIAGNOSIS — L720 Epidermal cyst: Secondary | ICD-10-CM | POA: Diagnosis not present

## 2017-02-14 DIAGNOSIS — L821 Other seborrheic keratosis: Secondary | ICD-10-CM | POA: Diagnosis not present

## 2017-02-14 DIAGNOSIS — L814 Other melanin hyperpigmentation: Secondary | ICD-10-CM | POA: Diagnosis not present

## 2017-02-14 DIAGNOSIS — D1801 Hemangioma of skin and subcutaneous tissue: Secondary | ICD-10-CM | POA: Diagnosis not present

## 2017-08-15 DIAGNOSIS — H1045 Other chronic allergic conjunctivitis: Secondary | ICD-10-CM | POA: Diagnosis not present

## 2017-08-15 DIAGNOSIS — Z961 Presence of intraocular lens: Secondary | ICD-10-CM | POA: Diagnosis not present

## 2017-08-15 DIAGNOSIS — H02839 Dermatochalasis of unspecified eye, unspecified eyelid: Secondary | ICD-10-CM | POA: Diagnosis not present

## 2017-08-15 DIAGNOSIS — H33312 Horseshoe tear of retina without detachment, left eye: Secondary | ICD-10-CM | POA: Diagnosis not present

## 2017-08-15 DIAGNOSIS — H59812 Chorioretinal scars after surgery for detachment, left eye: Secondary | ICD-10-CM | POA: Diagnosis not present

## 2017-08-15 DIAGNOSIS — H43813 Vitreous degeneration, bilateral: Secondary | ICD-10-CM | POA: Diagnosis not present

## 2017-08-15 DIAGNOSIS — H02889 Meibomian gland dysfunction of unspecified eye, unspecified eyelid: Secondary | ICD-10-CM | POA: Diagnosis not present

## 2017-10-26 NOTE — Progress Notes (Signed)
Chief Complaint  Patient presents with  . Annual Exam    HPI: Samuel Pearson 72 y.o. comes in today for Preventive Medicare exam/ wellness visit .and med eval   BP not checking    recnetly has white coat.  But taking med  No cough dise have dry eyes  Allergy seasonal?   Mostly tree time using  Washes and nose  spray . Stress  Mom age 67 memory care in [place] quite expensive  Not interested in statin meds  sees derm  See patch right thigh    Health Maintenance  Topic Date Due  . INFLUENZA VACCINE  12/07/2017  . TETANUS/TDAP  03/19/2023  . COLONOSCOPY  11/19/2026  . Hepatitis C Screening  Completed  . PNA vac Low Risk Adult  Completed   Health Maintenance Review LIFESTYLE:  Exercise:   Dogs related a mile .  Prisoner to air filter    Allergies  Local  Tobacco/ETS:  no Alcohol:  1-2  Per day  Sugar beverages: no Sleep: dpends   8 - 9  Drug use: no HH:2  3 dogs    Hearing: ok  Vision:  No limitations at present . Last eye check UTD  Safety:  Has smoke detector and wears seat belts.  No firearms. No excess sun exposure. Sees dentist regularly.  Has most  Eval? dosent want to  Go on if   dementia like mom   ROS:  GEN/ HEENT: No fever, significant weight changes sweats headaches vision problems hearing changes, CV/ PULM; No chest pain shortness of breath cough, syncope,edema  change in exercise tolerance. GI /GU: No adominal pain, vomiting, change in bowel habits. No blood in the stool. No significant GU symptoms. Nocturia x 1  SKIN/HEME: ,no acute skin rashes suspicious lesions or bleeding. No lymphadenopathy, nodules, masses.  NEURO/ PSYCH:  No neurologic signs such as weakness numbness. No depression anxiety. IMM/ Allergy: No unusual infections.  Allergy .   REST of 12 system review negative except as per HPI   Past Medical History:  Diagnosis Date  . Allergic rhinitis   . Anxiety   . Detached retina   . Heart murmur    has had one when a child and checked out  ok  never? had an echo  . History of chicken pox   . Hx of nonmelanoma skin cancer   . Hypertension   both parents live in to late 66s   Family History  Problem Relation Age of Onset  . Arthritis Mother   . Hypertension Mother   . Arthritis Father   . Hypertension Father     Social History   Socioeconomic History  . Marital status: Married    Spouse name: Not on file  . Number of children: Not on file  . Years of education: Not on file  . Highest education level: Not on file  Occupational History  . Not on file  Social Needs  . Financial resource strain: Not on file  . Food insecurity:    Worry: Not on file    Inability: Not on file  . Transportation needs:    Medical: Not on file    Non-medical: Not on file  Tobacco Use  . Smoking status: Never Smoker  . Smokeless tobacco: Never Used  Substance and Sexual Activity  . Alcohol use: Yes    Alcohol/week: 8.4 - 12.6 oz    Types: 14 - 21 Cans of beer per week  . Drug use:  No  . Sexual activity: Not on file  Lifestyle  . Physical activity:    Days per week: Not on file    Minutes per session: Not on file  . Stress: Not on file  Relationships  . Social connections:    Talks on phone: Not on file    Gets together: Not on file    Attends religious service: Not on file    Active member of club or organization: Not on file    Attends meetings of clubs or organizations: Not on file    Relationship status: Not on file  Other Topics Concern  . Not on file  Social History Narrative   Retired Film/video editor from [place]   Married   Some Caffeine   Helps take care of grandkids   recently moved residence     Outpatient Encounter Medications as of 10/27/2017  Medication Sig  . chlorpheniramine (CHLOR-TRIMETON) 4 MG tablet Take 4 mg by mouth 2 (two) times daily as needed for allergies.  . diphenhydrAMINE (BENADRYL) 25 MG tablet Take 25 mg by mouth every 6 (six) hours as needed.  Marland Kitchen lisinopril  (PRINIVIL,ZESTRIL) 20 MG tablet Take 1 tablet (20 mg total) by mouth daily.  . metoprolol tartrate (LOPRESSOR) 25 MG tablet TAKE 1 TABLET (25 MG TOTAL) BY MOUTH 2 (TWO) TIMES DAILY.  . [DISCONTINUED] aspirin EC 81 MG tablet Take 81 mg by mouth every other day.  . [DISCONTINUED] b complex vitamins tablet Take 1 tablet by mouth daily.    . [DISCONTINUED] ketotifen (ALAWAY) 0.025 % ophthalmic solution 1 drop 2 (two) times daily.  . [DISCONTINUED] pneumococcal 13-valent conjugate vaccine (PREVNAR 13) SUSP injection Inject 0.5 mLs into the muscle once.   No facility-administered encounter medications on file as of 10/27/2017.     EXAM:  BP (!) 151/77   Pulse 76   Ht 5\' 2"  (1.575 m)   Wt 155 lb (70.3 kg)   BMI 28.35 kg/m   Body mass index is 28.35 kg/m.  Physical Exam: Vital signs reviewed [id number]:[id number] is a well-developed well-nourished alert cooperative   who appears stated age in no acute distress.  HEENT: normocephalic atraumatic , Eyes: PERRL EOM's full, conjunctiva clear, Nares: paten,t no deformity discharge or tenderness., Ears: no deformity EAC's clear TMs with normal landmarks. Mouth: clear OP, no lesions, edema.  Moist mucous membranes. Dentition in adequate repair. NECK: supple without masses, thyromegaly or bruits. CHEST/PULM:  Clear to auscultation and percussion breath sounds equal no wheeze , rales or rhonchi.  CV: PMI is nondisplaced, S1 S2 no gallops, continuous  Hum m heard upper chest   Too and  fro  murmurs, rubs. Peripheral pulses are full without delay.No JVD .  ABDOMEN: Bowel sounds normal nontender  No guard or rebound, no hepato splenomegal no CVA tenderness.   Extremtities:  No clubbing cyanosis or edema, no acute joint swelling or redness no focal atrophy NEURO:  Oriented x3, cranial nerves 3-12 appear to be intact, no obvious focal weakness,gait within normal limits no abnormal reflexes or asymmetrical SKIN: No acute rashes normal turgor, color, no bruising or  petechiae. Right  Thigh a 3-4 mm grey scaly patch no redness prostateexam   Declined  No sig sx  PSYCH: Oriented, good eye contact, no obvious depression anxiety, cognition and judgment appear normal. LN: no cervical axillary inguinal adenopathy No noted deficits in memory, attention, and speech.   Lab Results  Component Value Date   WBC 5.7 10/27/2017  HGB 14.8 10/27/2017   HCT 43.1 10/27/2017   PLT 266.0 10/27/2017   GLUCOSE 113 (H) 10/27/2017   CHOL 226 (H) 10/27/2017   TRIG 133.0 10/27/2017   HDL 46.20 10/27/2017   LDLCALC 153 (H) 10/27/2017   ALT 19 10/27/2017   AST 19 10/27/2017   NA 140 10/27/2017   K 4.7 10/27/2017   CL 101 10/27/2017   CREATININE 0.74 10/27/2017   BUN 13 10/27/2017   CO2 29 10/27/2017   TSH 4.42 10/14/2015   PSA 1.90 10/14/2015   HGBA1C 5.9 10/27/2017   No results found for: VITAMINB12   ASSESSMENT AND PLAN:  Discussed the following assessment and plan:  Visit for preventive health examination  Medication management - Plan: Basic metabolic panel, CBC with Differential/Platelet, Hemoglobin A1c, Hepatic function panel, Lipid panel, High sensitivity CRP  Allergic rhinitis, unspecified seasonality, unspecified trigger - Plan: Basic metabolic panel, CBC with Differential/Platelet, Hemoglobin A1c, Hepatic function panel, Lipid panel, High sensitivity CRP  Essential hypertension - Plan: Basic metabolic panel, CBC with Differential/Platelet, Hemoglobin A1c, Hepatic function panel, Lipid panel, High sensitivity CRP  Hyperlipidemia, unspecified hyperlipidemia type - Plan: Basic metabolic panel, CBC with Differential/Platelet, Hemoglobin A1c, Hepatic function panel, Lipid panel, High sensitivity CRP  Fasting hyperglycemia - Plan: Basic metabolic panel, CBC with Differential/Platelet, Hemoglobin A1c, Hepatic function panel, Lipid panel, High sensitivity CRP  Stress Pt request  crp but was not interested in statin    At this time  Watch skin lesion looks  benign but see [facility] derm if  persistent or progressive  Shared Decision Making declined psa today Patient Care Team: Burnis Medin, MD as PCP - General dickinson Franciso Bend, DO (Dermatology)  Patient Instructions  Will notify you  of labs when available.  BP control  Aerobic exercise   and good sleep . and  control of vascular factors  .     Can prevent    cognitive decline .   Still consider statin  If elevated   bp goal is below 140/90 and closer to 120 /80 is best.    Please check your BP at least 3 days in a row   twice a day and send in readings      Health Maintenance, Male A healthy lifestyle and preventive care is important for your health and wellness. Ask your health care provider about what schedule of regular examinations is right for you. What should I know about weight and diet? Eat a Healthy Diet  Eat plenty of vegetables, fruits, whole grains, low-fat dairy products, and lean protein.  Do not eat a lot of foods high in solid fats, added sugars, or salt.  Maintain a Healthy Weight Regular exercise can help you achieve or maintain a healthy weight. You should:  Do at least 150 minutes of exercise each week. The exercise should increase your heart rate and make you sweat (moderate-intensity exercise).  Do strength-training exercises at least twice a week.  Watch Your Levels of Cholesterol and Blood Lipids  Have your blood tested for lipids and cholesterol every 5 years starting at 73 years of age. If you are at high risk for heart disease, you should start having your blood tested when you are 73 years old. You may need to have your cholesterol levels checked more often if: ? Your lipid or cholesterol levels are high. ? You are older than 73 years of age. ? You are at high risk for heart disease.  What should I know about  cancer screening? Many types of cancers can be detected early and may often be prevented. Lung Cancer  You should be screened every  year for lung cancer if: ? You are a current smoker who has smoked for at least 30 years. ? You are a former smoker who has quit within the past 15 years.  Talk to your health care provider about your screening options, when you should start screening, and how often you should be screened.  Colorectal Cancer  Routine colorectal cancer screening usually begins at 73 years of age and should be repeated every 5-10 years until you are 73 years old. You may need to be screened more often if early forms of precancerous polyps or small growths are found. Your health care provider may recommend screening at an earlier age if you have risk factors for colon cancer.  Your health care provider may recommend using home test kits to check for hidden blood in the stool.  A small camera at the end of a tube can be used to examine your colon (sigmoidoscopy or colonoscopy). This checks for the earliest forms of colorectal cancer.  Prostate and Testicular Cancer  Depending on your age and overall health, your health care provider may do certain tests to screen for prostate and testicular cancer.  Talk to your health care provider about any symptoms or concerns you have about testicular or prostate cancer.  Skin Cancer  Check your skin from head to toe regularly.  Tell your health care provider about any new moles or changes in moles, especially if: ? There is a change in a mole's size, shape, or color. ? You have a mole that is larger than a pencil eraser.  Always use sunscreen. Apply sunscreen liberally and repeat throughout the day.  Protect yourself by wearing long sleeves, pants, a wide-brimmed hat, and sunglasses when outside.  What should I know about heart disease, diabetes, and high blood pressure?  If you are 43-73 years of age, have your blood pressure checked every 3-5 years. If you are 52 years of age or older, have your blood pressure checked every year. You should have your blood  pressure measured twice-once when you are at a hospital or clinic, and once when you are not at a hospital or clinic. Record the average of the two measurements. To check your blood pressure when you are not at a hospital or clinic, you can use: ? An automated blood pressure machine at a pharmacy. ? A home blood pressure monitor.  Talk to your health care provider about your target blood pressure.  If you are between 35-67 years old, ask your health care provider if you should take aspirin to prevent heart disease.  Have regular diabetes screenings by checking your fasting blood sugar level. ? If you are at a normal weight and have a low risk for diabetes, have this test once every three years after the age of 25. ? If you are overweight and have a high risk for diabetes, consider being tested at a younger age or more often.  A one-time screening for abdominal aortic aneurysm (AAA) by ultrasound is recommended for men aged 50-75 years who are current or former smokers. What should I know about preventing infection? Hepatitis B If you have a higher risk for hepatitis B, you should be screened for this virus. Talk with your health care provider to find out if you are at risk for hepatitis B infection. Hepatitis C Blood testing is recommended  for:  Everyone born from [birth year] through 1966.  Anyone with known risk factors for hepatitis C.  Sexually Transmitted Diseases (STDs)  You should be screened each year for STDs including gonorrhea and chlamydia if: ? You are sexually active and are younger than 73 years of age. ? You are older than 73 years of age and your health care provider tells you that you are at risk for this type of infection. ? Your sexual activity has changed since you were last screened and you are at an increased risk for chlamydia or gonorrhea. Ask your health care provider if you are at risk.  Talk with your health care provider about whether you are at high risk of being  infected with HIV. Your health care provider may recommend a prescription medicine to help prevent HIV infection.  What else can I do?  Schedule regular health, dental, and eye exams.  Stay current with your vaccines (immunizations).  Do not use any tobacco products, such as cigarettes, chewing tobacco, and e-cigarettes. If you need help quitting, ask your health care provider.  Limit alcohol intake to no more than 2 drinks per day. One drink equals 12 ounces of beer, 5 ounces of wine, or 1 ounces of hard liquor.  Do not use street drugs.  Do not share needles.  Ask your health care provider for help if you need support or information about quitting drugs.  Tell your health care provider if you often feel depressed.  Tell your health care provider if you have ever been abused or do not feel safe at home. This information is not intended to replace advice given to you by your health care provider. Make sure you discuss any questions you have with your health care provider. Document Released: 10/22/2007 Document Revised: 12/23/2015 Document Reviewed: 01/27/2015 Elsevier Interactive Patient Education  2018 [place]. Panosh M.D.

## 2017-10-27 ENCOUNTER — Encounter: Payer: Self-pay | Admitting: Internal Medicine

## 2017-10-27 ENCOUNTER — Ambulatory Visit (INDEPENDENT_AMBULATORY_CARE_PROVIDER_SITE_OTHER): Payer: Medicare Other | Admitting: Internal Medicine

## 2017-10-27 VITALS — BP 151/77 | HR 76 | Ht 62.0 in | Wt 155.0 lb

## 2017-10-27 DIAGNOSIS — R7301 Impaired fasting glucose: Secondary | ICD-10-CM | POA: Diagnosis not present

## 2017-10-27 DIAGNOSIS — E785 Hyperlipidemia, unspecified: Secondary | ICD-10-CM

## 2017-10-27 DIAGNOSIS — Z79899 Other long term (current) drug therapy: Secondary | ICD-10-CM | POA: Diagnosis not present

## 2017-10-27 DIAGNOSIS — J309 Allergic rhinitis, unspecified: Secondary | ICD-10-CM

## 2017-10-27 DIAGNOSIS — F439 Reaction to severe stress, unspecified: Secondary | ICD-10-CM | POA: Diagnosis not present

## 2017-10-27 DIAGNOSIS — I10 Essential (primary) hypertension: Secondary | ICD-10-CM

## 2017-10-27 DIAGNOSIS — Z0000 Encounter for general adult medical examination without abnormal findings: Secondary | ICD-10-CM

## 2017-10-27 LAB — BASIC METABOLIC PANEL
BUN: 13 mg/dL (ref 6–23)
CALCIUM: 9.6 mg/dL (ref 8.4–10.5)
CHLORIDE: 101 meq/L (ref 96–112)
CO2: 29 meq/L (ref 19–32)
Creatinine, Ser: 0.74 mg/dL (ref 0.40–1.50)
GFR: 110.07 mL/min (ref >60.00)
Glucose, Bld: 113 mg/dL — ABNORMAL HIGH (ref 70–99)
Potassium: 4.7 mEq/L (ref 3.5–5.1)
SODIUM: 140 meq/L (ref 135–145)

## 2017-10-27 LAB — LIPID PANEL
CHOL/HDL RATIO: 5
Cholesterol: 226 mg/dL — ABNORMAL HIGH (ref 0–200)
HDL: 46.2 mg/dL (ref >39.00)
LDL Cholesterol: 153 mg/dL — ABNORMAL HIGH (ref 0–99)
NONHDL: 179.98
TRIGLYCERIDES: 133 mg/dL (ref 0.0–149.0)
VLDL: 26.6 mg/dL (ref 0.0–40.0)

## 2017-10-27 LAB — HEPATIC FUNCTION PANEL
ALT: 19 U/L (ref 0–53)
AST: 19 U/L (ref 0–37)
Albumin: 4.7 g/dL (ref 3.5–5.2)
Alkaline Phosphatase: 53 U/L (ref 39–117)
BILIRUBIN DIRECT: 0.1 mg/dL (ref 0.0–0.3)
TOTAL PROTEIN: 7.3 g/dL (ref 6.0–8.3)
Total Bilirubin: 0.7 mg/dL (ref 0.2–1.2)

## 2017-10-27 LAB — CBC WITH DIFFERENTIAL/PLATELET
BASOS ABS: 0 10*3/uL (ref 0.0–0.1)
BASOS PCT: 0.3 % (ref 0.0–3.0)
Eosinophils Absolute: 0.2 10*3/uL (ref 0.0–0.7)
Eosinophils Relative: 2.6 % (ref 0.0–5.0)
HEMATOCRIT: 43.1 % (ref 39.0–52.0)
HEMOGLOBIN: 14.8 g/dL (ref 13.0–17.0)
LYMPHS PCT: 16.2 % (ref 12.0–46.0)
Lymphs Abs: 0.9 10*3/uL (ref 0.7–4.0)
MCHC: 34.3 g/dL (ref 30.0–36.0)
MCV: 109.4 fl — AB (ref 78.0–100.0)
MONOS PCT: 10.9 % (ref 3.0–12.0)
Monocytes Absolute: 0.6 10*3/uL (ref 0.1–1.0)
NEUTROS ABS: 4 10*3/uL (ref 1.4–7.7)
Neutrophils Relative %: 70 % (ref 43.0–77.0)
PLATELETS: 266 10*3/uL (ref 150.0–400.0)
RBC: 3.94 Mil/uL — ABNORMAL LOW (ref 4.22–5.81)
RDW: 13.9 % (ref 11.5–15.5)
WBC: 5.7 10*3/uL (ref 4.0–10.5)

## 2017-10-27 LAB — HIGH SENSITIVITY CRP: CRP HIGH SENSITIVITY: 2.06 mg/L (ref 0.000–5.000)

## 2017-10-27 LAB — HEMOGLOBIN A1C: Hgb A1c MFr Bld: 5.9 % (ref 4.6–6.5)

## 2017-10-27 NOTE — Patient Instructions (Addendum)
Will notify you  of labs when available.  BP control  Aerobic exercise   and good sleep . and  control of vascular factors  .     Can prevent    cognitive decline .   Still consider statin  If elevated   bp goal is below 140/90 and closer to 120 /80 is best.    Please check your BP at least 3 days in a row   twice a day and send in readings      Health Maintenance, Male A healthy lifestyle and preventive care is important for your health and wellness. Ask your health care provider about what schedule of regular examinations is right for you. What should I know about weight and diet? Eat a Healthy Diet  Eat plenty of vegetables, fruits, whole grains, low-fat dairy products, and lean protein.  Do not eat a lot of foods high in solid fats, added sugars, or salt.  Maintain a Healthy Weight Regular exercise can help you achieve or maintain a healthy weight. You should:  Do at least 150 minutes of exercise each week. The exercise should increase your heart rate and make you sweat (moderate-intensity exercise).  Do strength-training exercises at least twice a week.  Watch Your Levels of Cholesterol and Blood Lipids  Have your blood tested for lipids and cholesterol every 5 years starting at 73 years of age. If you are at high risk for heart disease, you should start having your blood tested when you are 73 years old. You may need to have your cholesterol levels checked more often if: ? Your lipid or cholesterol levels are high. ? You are older than 73 years of age. ? You are at high risk for heart disease.  What should I know about cancer screening? Many types of cancers can be detected early and may often be prevented. Lung Cancer  You should be screened every year for lung cancer if: ? You are a current smoker who has smoked for at least 30 years. ? You are a former smoker who has quit within the past 15 years.  Talk to your health care provider about your screening options, when  you should start screening, and how often you should be screened.  Colorectal Cancer  Routine colorectal cancer screening usually begins at 73 years of age and should be repeated every 5-10 years until you are 73 years old. You may need to be screened more often if early forms of precancerous polyps or small growths are found. Your health care provider may recommend screening at an earlier age if you have risk factors for colon cancer.  Your health care provider may recommend using home test kits to check for hidden blood in the stool.  A small camera at the end of a tube can be used to examine your colon (sigmoidoscopy or colonoscopy). This checks for the earliest forms of colorectal cancer.  Prostate and Testicular Cancer  Depending on your age and overall health, your health care provider may do certain tests to screen for prostate and testicular cancer.  Talk to your health care provider about any symptoms or concerns you have about testicular or prostate cancer.  Skin Cancer  Check your skin from head to toe regularly.  Tell your health care provider about any new moles or changes in moles, especially if: ? There is a change in a mole's size, shape, or color. ? You have a mole that is larger than a pencil eraser.  Always use sunscreen. Apply sunscreen liberally and repeat throughout the day.  Protect yourself by wearing long sleeves, pants, a wide-brimmed hat, and sunglasses when outside.  What should I know about heart disease, diabetes, and high blood pressure?  If you are 66-63 years of age, have your blood pressure checked every 3-5 years. If you are 2 years of age or older, have your blood pressure checked every year. You should have your blood pressure measured twice-once when you are at a hospital or clinic, and once when you are not at a hospital or clinic. Record the average of the two measurements. To check your blood pressure when you are not at a hospital or clinic,  you can use: ? An automated blood pressure machine at a pharmacy. ? A home blood pressure monitor.  Talk to your health care provider about your target blood pressure.  If you are between 41-66 years old, ask your health care provider if you should take aspirin to prevent heart disease.  Have regular diabetes screenings by checking your fasting blood sugar level. ? If you are at a normal weight and have a low risk for diabetes, have this test once every three years after the age of 51. ? If you are overweight and have a high risk for diabetes, consider being tested at a younger age or more often.  A one-time screening for abdominal aortic aneurysm (AAA) by ultrasound is recommended for men aged 29-75 years who are current or former smokers. What should I know about preventing infection? Hepatitis B If you have a higher risk for hepatitis B, you should be screened for this virus. Talk with your health care provider to find out if you are at risk for hepatitis B infection. Hepatitis C Blood testing is recommended for:  Everyone born from [birth year] through 1966.  Anyone with known risk factors for hepatitis C.  Sexually Transmitted Diseases (STDs)  You should be screened each year for STDs including gonorrhea and chlamydia if: ? You are sexually active and are younger than 73 years of age. ? You are older than 73 years of age and your health care provider tells you that you are at risk for this type of infection. ? Your sexual activity has changed since you were last screened and you are at an increased risk for chlamydia or gonorrhea. Ask your health care provider if you are at risk.  Talk with your health care provider about whether you are at high risk of being infected with HIV. Your health care provider may recommend a prescription medicine to help prevent HIV infection.  What else can I do?  Schedule regular health, dental, and eye exams.  Stay current with your vaccines  (immunizations).  Do not use any tobacco products, such as cigarettes, chewing tobacco, and e-cigarettes. If you need help quitting, ask your health care provider.  Limit alcohol intake to no more than 2 drinks per day. One drink equals 12 ounces of beer, 5 ounces of wine, or 1 ounces of hard liquor.  Do not use street drugs.  Do not share needles.  Ask your health care provider for help if you need support or information about quitting drugs.  Tell your health care provider if you often feel depressed.  Tell your health care provider if you have ever been abused or do not feel safe at home. This information is not intended to replace advice given to you by your health care provider. Make sure you discuss any  questions you have with your health care provider. Document Released: 10/22/2007 Document Revised: 12/23/2015 Document Reviewed: 01/27/2015 Elsevier Interactive Patient Education  [employer].

## 2017-12-14 ENCOUNTER — Other Ambulatory Visit: Payer: Self-pay | Admitting: Internal Medicine

## 2017-12-21 ENCOUNTER — Other Ambulatory Visit: Payer: Self-pay | Admitting: Internal Medicine

## 2018-02-19 DIAGNOSIS — L570 Actinic keratosis: Secondary | ICD-10-CM | POA: Diagnosis not present

## 2018-02-19 DIAGNOSIS — D225 Melanocytic nevi of trunk: Secondary | ICD-10-CM | POA: Diagnosis not present

## 2018-02-19 DIAGNOSIS — L821 Other seborrheic keratosis: Secondary | ICD-10-CM | POA: Diagnosis not present

## 2018-02-19 DIAGNOSIS — Z872 Personal history of diseases of the skin and subcutaneous tissue: Secondary | ICD-10-CM | POA: Diagnosis not present

## 2018-02-19 DIAGNOSIS — D1801 Hemangioma of skin and subcutaneous tissue: Secondary | ICD-10-CM | POA: Diagnosis not present

## 2018-05-29 DIAGNOSIS — L570 Actinic keratosis: Secondary | ICD-10-CM | POA: Diagnosis not present

## 2018-10-29 NOTE — Progress Notes (Signed)
Chief Complaint  Patient presents with  . Annual Exam    pt only has one concern about bad skin surgery     HPI: Samuel Pearson 73 y.o. comes in today for Preventive Medicare exam/ wellness visit .Since last visit no major change in health  Works on lsi to get down lipids and  bp  Sees derm about  SSca risk and lesion   Vision and hearing stable     BP :  Seems in rang and no new concerns  Sharing some info for  Perspective  Biggested challengesFear of dying in nursing home and debilitated   As parents  Live into 66s   Mom age 61 in NH in [place] of [place]   [place]  .  Trip. Lots of [place]  Exposures.  Concern  Didn't get sick .  Anxiety when watches the stock market(  pref profession  )  Health Maintenance  Topic Date Due  . INFLUENZA VACCINE  12/08/2018  . TETANUS/TDAP  03/19/2023  . COLONOSCOPY  11/19/2026  . Hepatitis C Screening  Completed  . PNA vac Low Risk Adult  Completed   Health Maintenance Review LIFESTYLE:  Exercise:   Yes   Tobacco/ETS:  no Alcohol:  Depends  On stock market.    Sugar beverages:   Sleep:  Plenty  Drug use: no HH:  3 dogs   Walking    Gets exercises .    Mom is 62   In [place] home.     Father 94 assisted  Suicide .     Hearing:    ok  Vision:  No limitations at present . Last eye check [facility] [place]  .   Safety:  Has smoke detector and wears seat belts.  No firearms. No excess sun exposure. Sees dentist regularly.  Falls:  no  Advance directive :  Reviewed  Has one.  Memory: Felt to be good  , no concern fromfamily.  Depression: No anhedonia unusual crying or depressive symptoms   Nutrition: Eats well balanced diet; adequate calcium and vitamin D. No swallowing chewing problems.  Injury: no major injuries in the last six months.  Other healthcare providers:  Reviewed today .  Preventive parameters: up-to-date  Reviewed   ADLS:   There are no problems or need for assistance  driving, feeding, obtaining food, dressing,  toileting and bathing, managing money using phone.  is independent.    ROS:  Only cop aching in am when weather changes  Otherwise ok  GEN/ HEENT: No fever, significant weight changes sweats headaches vision problems hearing changes, CV/ PULM; No chest pain shortness of breath cough, syncope,edema  change in exercise tolerance. GI /GU: No adominal pain, vomiting, change in bowel habits. No blood in the stool. No significant GU symptoms. SKIN/HEME: ,no acute skin rashes suspicious lesions or bleeding. No lymphadenopathy, nodules, masses.  NEURO/ PSYCH:  No neurologic signs such as weakness numbness. No depression anxiety. IMM/ Allergy: No unusual infections.  Allergy .   REST of 12 system review negative except as per HPI   Past Medical History:  Diagnosis Date  . Allergic rhinitis   . Anxiety   . Detached retina   . Heart murmur    has had one when a child and checked out ok  never? had an echo  . History of chicken pox   . Hx of nonmelanoma skin cancer   . Hypertension     Family History  Problem Relation  Age of Onset  . Arthritis Mother   . Hypertension Mother   . Arthritis Father   . Hypertension Father     Social History   Socioeconomic History  . Marital status: Married    Spouse name: Not on file  . Number of children: Not on file  . Years of education: Not on file  . Highest education level: Not on file  Occupational History  . Not on file  Social Needs  . Financial resource strain: Not on file  . Food insecurity    Worry: Not on file    Inability: Not on file  . Transportation needs    Medical: Not on file    Non-medical: Not on file  Tobacco Use  . Smoking status: Never Smoker  . Smokeless tobacco: Never Used  Substance and Sexual Activity  . Alcohol use: Yes    Alcohol/week: 14.0 - 21.0 standard drinks    Types: 14 - 21 Cans of beer per week  . Drug use: No  . Sexual activity: Not on file  Lifestyle  . Physical activity    Days per week: Not on  file    Minutes per session: Not on file  . Stress: Not on file  Relationships  . Social Herbalist on phone: Not on file    Gets together: Not on file    Attends religious service: Not on file    Active member of club or organization: Not on file    Attends meetings of clubs or organizations: Not on file    Relationship status: Not on file  Other Topics Concern  . Not on file  Social History Narrative   Retired Film/video editor from [place]   Married   Some Caffeine   Helps take care of grandkids   recently moved residence     Outpatient Encounter Medications as of 10/30/2018  Medication Sig  . chlorpheniramine (CHLOR-TRIMETON) 4 MG tablet Take 4 mg by mouth 2 (two) times daily as needed for allergies.  . diphenhydrAMINE (BENADRYL) 25 MG tablet Take 25 mg by mouth every 6 (six) hours as needed.  Marland Kitchen lisinopril (PRINIVIL,ZESTRIL) 20 MG tablet TAKE 1 TABLET BY MOUTH EVERY DAY  . metoprolol tartrate (LOPRESSOR) 25 MG tablet TAKE 1 TABLET BY MOUTH TWICE A DAY   No facility-administered encounter medications on file as of 10/30/2018.     EXAM:  BP 136/78 (BP Location: Right Arm, Patient Position: Sitting, Cuff Size: Normal)   Pulse 97   Temp 98.9 F (37.2 C) (Oral)   Ht 5' 1.75" (1.568 m)   Wt 154 lb 9.6 oz (70.1 kg)   SpO2 96%   BMI 28.51 kg/m   Body mass index is 28.51 kg/m.  Physical Exam: Vital signs reviewed WPY:KDXI is a well-developed well-nourished alert cooperative   who appears stated age in no acute distress.  HEENT: normocephalic atraumatic , Eyes: PERRL EOM's full, conjunctiva clear, Nares: paten,t no deformity discharge or tenderness., Ears: no deformity EAC's wax soft landmarks. Mouth: clear OP, deferred  Masked per dentist NECK: supple without masses, thyromegaly or bruits. CHEST/PULM:  Clear to auscultation and percussion breath sounds equal no wheeze , rales or rhonchi. No chest wall deformities or tenderness. CV: PMI is  nondisplaced, S1 S2 no gallops,too andfro ? Venous hum right upper chest   No , rubs. Peripheral pulses are full without delay.No JVD .  ABDOMEN: Bowel sounds normal nontender  No guard or  rebound, no hepato splenomegal no CVA tenderness.   Extremtities:  No clubbing cyanosis or edema, no acute joint swelling or redness no focal atrophy NEURO:  Oriented x3, cranial nerves 3-12 appear to be intact, no obvious focal weakness,gait within normal limits no abnormal reflexes or asymmetrical SKIN: No acute rashes normal turgor, color, no bruising or petechiae. Few scaly spots  Poss aks  PSYCH: Oriented, good eye contact, no obvious depression anxiety, cognition and judgment appear normal. LN: no cervical axillary inguinal adenopathy No noted deficits in memory, attention, and speech.   Lab Results  Component Value Date   WBC 5.7 10/27/2017   HGB 14.8 10/27/2017   HCT 43.1 10/27/2017   PLT 266.0 10/27/2017   GLUCOSE 113 (H) 10/27/2017   CHOL 226 (H) 10/27/2017   TRIG 133.0 10/27/2017   HDL 46.20 10/27/2017   LDLCALC 153 (H) 10/27/2017   ALT 19 10/27/2017   AST 19 10/27/2017   NA 140 10/27/2017   K 4.7 10/27/2017   CL 101 10/27/2017   CREATININE 0.74 10/27/2017   BUN 13 10/27/2017   CO2 29 10/27/2017   TSH 4.42 10/14/2015   PSA 1.90 10/14/2015   HGBA1C 5.9 10/27/2017    ASSESSMENT AND PLAN:  Discussed the following assessment and plan:   ICD-10-CM   1. Visit for preventive health examination  Z00.00   2. Medication management  D78.242 Basic metabolic panel    CBC with Differential/Platelet    Hemoglobin A1c    Hepatic function panel    Lipid panel  3. Essential hypertension  P53 Basic metabolic panel    CBC with Differential/Platelet    Hemoglobin A1c    Hepatic function panel    Lipid panel  4. Fasting hyperglycemia  I14.43 Basic metabolic panel    CBC with Differential/Platelet    Hemoglobin A1c    Hepatic function panel    Lipid panel  5. Hyperlipidemia, unspecified  hyperlipidemia type  X54.0 Basic metabolic panel    CBC with Differential/Platelet    Hemoglobin A1c    Hepatic function panel    Lipid panel  6. Heart murmur life long poss venous hum right [place] area   G86.7 Basic metabolic panel    CBC with Differential/Platelet    Hemoglobin A1c    Hepatic function panel    Lipid panel   Shared Decision Making About lab and plan  Continue  meds  Control  prevention disc  Strategies  And lsi as important as meds    Limiting etoh cont exercise  Etc  consider other skin  derms if appropriate    Patient Care Team: Burnis Medin, MD as PCP - General dickinson Riley Churches, Lucita Ferrara, DO (Dermatology)  Patient Instructions  Continue lifestyle intervention healthy eating and exercise .  bp goals  Will notify you  of labs when available.    Health Maintenance After Age 37 After age 14, you are at a higher risk for certain long-term diseases and infections as well as injuries from falls. Falls are a major cause of broken bones and head injuries in people who are older than age 62. Getting regular preventive care can help to keep you healthy and well. Preventive care includes getting regular testing and making lifestyle changes as recommended by your health care provider. Talk with your health care provider about:  Which screenings and tests you should have. A screening is a test that checks for a disease when you have no symptoms.  A diet and exercise plan  that is right for you. What should I know about screenings and tests to prevent falls? Screening and testing are the best ways to find a health problem early. Early diagnosis and treatment give you the best chance of managing medical conditions that are common after age 56. Certain conditions and lifestyle choices may make you more likely to have a fall. Your health care provider may recommend:  Regular vision checks. Poor vision and conditions such as cataracts can make you more likely to have a fall. If  you wear glasses, make sure to get your prescription updated if your vision changes.  Medicine review. Work with your health care provider to regularly review all of the medicines you are taking, including over-the-counter medicines. Ask your health care provider about any side effects that may make you more likely to have a fall. Tell your health care provider if any medicines that you take make you feel dizzy or sleepy.  Osteoporosis screening. Osteoporosis is a condition that causes the bones to get weaker. This can make the bones weak and cause them to break more easily.  Blood pressure screening. Blood pressure changes and medicines to control blood pressure can make you feel dizzy.  Strength and balance checks. Your health care provider may recommend certain tests to check your strength and balance while standing, walking, or changing positions.  Foot health exam. Foot pain and numbness, as well as not wearing proper footwear, can make you more likely to have a fall.  Depression screening. You may be more likely to have a fall if you have a fear of falling, feel emotionally low, or feel unable to do activities that you used to do.  Alcohol use screening. Using too much alcohol can affect your balance and may make you more likely to have a fall. What actions can I take to lower my risk of falls? General instructions  Talk with your health care provider about your risks for falling. Tell your health care provider if: ? You fall. Be sure to tell your health care provider about all falls, even ones that seem minor. ? You feel dizzy, sleepy, or off-balance.  Take over-the-counter and prescription medicines only as told by your health care provider. These include any supplements.  Eat a healthy diet and maintain a healthy weight. A healthy diet includes low-fat dairy products, low-fat (lean) meats, and fiber from whole grains, beans, and lots of fruits and vegetables. Home safety  Remove  any tripping hazards, such as rugs, cords, and clutter.  Install safety equipment such as grab bars in bathrooms and safety rails on stairs.  Keep rooms and walkways well-lit. Activity   Follow a regular exercise program to stay fit. This will help you maintain your balance. Ask your health care provider what types of exercise are appropriate for you.  If you need a cane or walker, use it as recommended by your health care provider.  Wear supportive shoes that have nonskid soles. Lifestyle  Do not drink alcohol if your health care provider tells you not to drink.  If you drink alcohol, limit how much you have: ? 0-1 drink a day for women. ? 0-2 drinks a day for men.  Be aware of how much alcohol is in your drink. In the [place] one drink equals one typical bottle of beer (12 oz), one-half glass of wine (5 oz), or one shot of hard liquor (1 oz).  Do not use any products that contain nicotine or tobacco, such as  cigarettes and e-cigarettes. If you need help quitting, ask your health care provider. Summary  Having a healthy lifestyle and getting preventive care can help to protect your health and wellness after age 28.  Screening and testing are the best way to find a health problem early and help you avoid having a fall. Early diagnosis and treatment give you the best chance for managing medical conditions that are more common for people who are older than age 14.  Falls are a major cause of broken bones and head injuries in people who are older than age 60. Take precautions to prevent a fall at home.  Work with your health care provider to learn what changes you can make to improve your health and wellness and to prevent falls. This information is not intended to replace advice given to you by your health care provider. Make sure you discuss any questions you have with your health care provider. Document Released: 03/08/2017 Document Revised: 03/08/2017 Document Reviewed: 03/08/2017  Elsevier Interactive Patient Education  2019 [place] K. Chyann Ambrocio M.D.

## 2018-10-30 ENCOUNTER — Ambulatory Visit (INDEPENDENT_AMBULATORY_CARE_PROVIDER_SITE_OTHER): Payer: Medicare Other | Admitting: Internal Medicine

## 2018-10-30 ENCOUNTER — Other Ambulatory Visit: Payer: Self-pay

## 2018-10-30 ENCOUNTER — Encounter: Payer: Self-pay | Admitting: Internal Medicine

## 2018-10-30 VITALS — BP 136/78 | HR 97 | Temp 98.9°F | Ht 61.75 in | Wt 154.6 lb

## 2018-10-30 DIAGNOSIS — Z79899 Other long term (current) drug therapy: Secondary | ICD-10-CM | POA: Diagnosis not present

## 2018-10-30 DIAGNOSIS — E785 Hyperlipidemia, unspecified: Secondary | ICD-10-CM | POA: Diagnosis not present

## 2018-10-30 DIAGNOSIS — R7301 Impaired fasting glucose: Secondary | ICD-10-CM

## 2018-10-30 DIAGNOSIS — R011 Cardiac murmur, unspecified: Secondary | ICD-10-CM | POA: Diagnosis not present

## 2018-10-30 DIAGNOSIS — I10 Essential (primary) hypertension: Secondary | ICD-10-CM | POA: Diagnosis not present

## 2018-10-30 DIAGNOSIS — Z0000 Encounter for general adult medical examination without abnormal findings: Secondary | ICD-10-CM

## 2018-10-30 LAB — CBC WITH DIFFERENTIAL/PLATELET
Basophils Absolute: 0 10*3/uL (ref 0.0–0.1)
Basophils Relative: 0.3 % (ref 0.0–3.0)
Eosinophils Absolute: 0.2 10*3/uL (ref 0.0–0.7)
Eosinophils Relative: 3 % (ref 0.0–5.0)
HCT: 43.6 % (ref 39.0–52.0)
Hemoglobin: 14.5 g/dL (ref 13.0–17.0)
Lymphocytes Relative: 16.2 % (ref 12.0–46.0)
Lymphs Abs: 1 10*3/uL (ref 0.7–4.0)
MCHC: 33.1 g/dL (ref 30.0–36.0)
MCV: 103.1 fl — ABNORMAL HIGH (ref 78.0–100.0)
Monocytes Absolute: 0.9 10*3/uL (ref 0.1–1.0)
Monocytes Relative: 14.3 % — ABNORMAL HIGH (ref 3.0–12.0)
Neutro Abs: 4.1 10*3/uL (ref 1.4–7.7)
Neutrophils Relative %: 66.2 % (ref 43.0–77.0)
Platelets: 255 10*3/uL (ref 150.0–400.0)
RBC: 4.23 Mil/uL (ref 4.22–5.81)
RDW: 14 % (ref 11.5–15.5)
WBC: 6.2 10*3/uL (ref 4.0–10.5)

## 2018-10-30 LAB — BASIC METABOLIC PANEL
BUN: 15 mg/dL (ref 6–23)
CO2: 29 mEq/L (ref 19–32)
Calcium: 9.5 mg/dL (ref 8.4–10.5)
Chloride: 102 mEq/L (ref 96–112)
Creatinine, Ser: 0.72 mg/dL (ref 0.40–1.50)
GFR: 106.59 mL/min (ref >60.00)
Glucose, Bld: 101 mg/dL — ABNORMAL HIGH (ref 70–99)
Potassium: 4.9 mEq/L (ref 3.5–5.1)
Sodium: 139 mEq/L (ref 135–145)

## 2018-10-30 LAB — LIPID PANEL
Cholesterol: 210 mg/dL — ABNORMAL HIGH (ref 0–200)
HDL: 42.6 mg/dL (ref >39.00)
LDL Cholesterol: 140 mg/dL — ABNORMAL HIGH (ref 0–99)
NonHDL: 167.33
Total CHOL/HDL Ratio: 5
Triglycerides: 139 mg/dL (ref 0.0–149.0)
VLDL: 27.8 mg/dL (ref 0.0–40.0)

## 2018-10-30 LAB — HEPATIC FUNCTION PANEL
ALT: 22 U/L (ref 0–53)
AST: 20 U/L (ref 0–37)
Albumin: 4.5 g/dL (ref 3.5–5.2)
Alkaline Phosphatase: 54 U/L (ref 39–117)
Bilirubin, Direct: 0.1 mg/dL (ref 0.0–0.3)
Total Bilirubin: 0.7 mg/dL (ref 0.2–1.2)
Total Protein: 6.8 g/dL (ref 6.0–8.3)

## 2018-10-30 LAB — HEMOGLOBIN A1C: Hgb A1c MFr Bld: 6 % (ref 4.6–6.5)

## 2018-10-30 NOTE — Patient Instructions (Signed)
Continue lifestyle intervention healthy eating and exercise .  bp goals  Will notify you  of labs when available.    Health Maintenance After Age 74 After age 91, you are at a higher risk for certain long-term diseases and infections as well as injuries from falls. Falls are a major cause of broken bones and head injuries in people who are older than age 2. Getting regular preventive care can help to keep you healthy and well. Preventive care includes getting regular testing and making lifestyle changes as recommended by your health care provider. Talk with your health care provider about:  Which screenings and tests you should have. A screening is a test that checks for a disease when you have no symptoms.  A diet and exercise plan that is right for you. What should I know about screenings and tests to prevent falls? Screening and testing are the best ways to find a health problem early. Early diagnosis and treatment give you the best chance of managing medical conditions that are common after age 59. Certain conditions and lifestyle choices may make you more likely to have a fall. Your health care provider may recommend:  Regular vision checks. Poor vision and conditions such as cataracts can make you more likely to have a fall. If you wear glasses, make sure to get your prescription updated if your vision changes.  Medicine review. Work with your health care provider to regularly review all of the medicines you are taking, including over-the-counter medicines. Ask your health care provider about any side effects that may make you more likely to have a fall. Tell your health care provider if any medicines that you take make you feel dizzy or sleepy.  Osteoporosis screening. Osteoporosis is a condition that causes the bones to get weaker. This can make the bones weak and cause them to break more easily.  Blood pressure screening. Blood pressure changes and medicines to control blood pressure  can make you feel dizzy.  Strength and balance checks. Your health care provider may recommend certain tests to check your strength and balance while standing, walking, or changing positions.  Foot health exam. Foot pain and numbness, as well as not wearing proper footwear, can make you more likely to have a fall.  Depression screening. You may be more likely to have a fall if you have a fear of falling, feel emotionally low, or feel unable to do activities that you used to do.  Alcohol use screening. Using too much alcohol can affect your balance and may make you more likely to have a fall. What actions can I take to lower my risk of falls? General instructions  Talk with your health care provider about your risks for falling. Tell your health care provider if: ? You fall. Be sure to tell your health care provider about all falls, even ones that seem minor. ? You feel dizzy, sleepy, or off-balance.  Take over-the-counter and prescription medicines only as told by your health care provider. These include any supplements.  Eat a healthy diet and maintain a healthy weight. A healthy diet includes low-fat dairy products, low-fat (lean) meats, and fiber from whole grains, beans, and lots of fruits and vegetables. Home safety  Remove any tripping hazards, such as rugs, cords, and clutter.  Install safety equipment such as grab bars in bathrooms and safety rails on stairs.  Keep rooms and walkways well-lit. Activity   Follow a regular exercise program to stay fit. This will help you  maintain your balance. Ask your health care provider what types of exercise are appropriate for you.  If you need a cane or walker, use it as recommended by your health care provider.  Wear supportive shoes that have nonskid soles. Lifestyle  Do not drink alcohol if your health care provider tells you not to drink.  If you drink alcohol, limit how much you have: ? 0-1 drink a day for women. ? 0-2 drinks  a day for men.  Be aware of how much alcohol is in your drink. In the [place] one drink equals one typical bottle of beer (12 oz), one-half glass of wine (5 oz), or one shot of hard liquor (1 oz).  Do not use any products that contain nicotine or tobacco, such as cigarettes and e-cigarettes. If you need help quitting, ask your health care provider. Summary  Having a healthy lifestyle and getting preventive care can help to protect your health and wellness after age 41.  Screening and testing are the best way to find a health problem early and help you avoid having a fall. Early diagnosis and treatment give you the best chance for managing medical conditions that are more common for people who are older than age 56.  Falls are a major cause of broken bones and head injuries in people who are older than age 66. Take precautions to prevent a fall at home.  Work with your health care provider to learn what changes you can make to improve your health and wellness and to prevent falls. This information is not intended to replace advice given to you by your health care provider. Make sure you discuss any questions you have with your health care provider. Document Released: 03/08/2017 Document Revised: 03/08/2017 Document Reviewed: 03/08/2017 Elsevier Interactive Patient Education  2019 [employer].

## 2018-12-24 ENCOUNTER — Other Ambulatory Visit: Payer: Self-pay | Admitting: Internal Medicine

## 2019-02-05 DIAGNOSIS — H43813 Vitreous degeneration, bilateral: Secondary | ICD-10-CM | POA: Diagnosis not present

## 2019-02-05 DIAGNOSIS — H524 Presbyopia: Secondary | ICD-10-CM | POA: Diagnosis not present

## 2019-02-05 DIAGNOSIS — H02834 Dermatochalasis of left upper eyelid: Secondary | ICD-10-CM | POA: Diagnosis not present

## 2019-02-05 DIAGNOSIS — H5200 Hypermetropia, unspecified eye: Secondary | ICD-10-CM | POA: Diagnosis not present

## 2019-02-05 DIAGNOSIS — H59812 Chorioretinal scars after surgery for detachment, left eye: Secondary | ICD-10-CM | POA: Diagnosis not present

## 2019-02-05 DIAGNOSIS — H33312 Horseshoe tear of retina without detachment, left eye: Secondary | ICD-10-CM | POA: Diagnosis not present

## 2019-02-05 DIAGNOSIS — H1045 Other chronic allergic conjunctivitis: Secondary | ICD-10-CM | POA: Diagnosis not present

## 2019-02-05 DIAGNOSIS — Z0100 Encounter for examination of eyes and vision without abnormal findings: Secondary | ICD-10-CM | POA: Diagnosis not present

## 2019-02-05 DIAGNOSIS — H52209 Unspecified astigmatism, unspecified eye: Secondary | ICD-10-CM | POA: Diagnosis not present

## 2019-02-05 DIAGNOSIS — H02832 Dermatochalasis of right lower eyelid: Secondary | ICD-10-CM | POA: Diagnosis not present

## 2019-02-05 DIAGNOSIS — Z961 Presence of intraocular lens: Secondary | ICD-10-CM | POA: Diagnosis not present

## 2019-02-05 DIAGNOSIS — H02835 Dermatochalasis of left lower eyelid: Secondary | ICD-10-CM | POA: Diagnosis not present

## 2019-02-05 DIAGNOSIS — H02831 Dermatochalasis of right upper eyelid: Secondary | ICD-10-CM | POA: Diagnosis not present

## 2019-06-09 ENCOUNTER — Ambulatory Visit: Payer: Medicare Other

## 2019-06-20 ENCOUNTER — Ambulatory Visit: Payer: Medicare Other

## 2019-07-10 NOTE — Progress Notes (Signed)
Virtual Visit via Video Note  I connected with@ on 3 4 2021  at  8:30 AM EST by a video enabled telemedicine application and verified that I am speaking with the correct person using two identifiers. Location patient: home Location provider:r home office Persons participating in the virtual visit: patient, provider  [place] national recommendations  regarding COVID 19 pandemic   video visit is advised over in office visit for this patient.  Patient aware  of the limitations of evaluation and management by telemedicine and  availability of in person appointments. and agreed to proceed.   HPI: Samuel Pearson presents for video visit because of predicament with left middle finger for the past month awakens with flexion and cannot straighten without pain unless  Maneuvers   Not getting better  No recent injury  But used to wrestle  And get finger  injuries mostly on right .   recently slipped in mud and had dis loc of right finger but that is doing better .  Right hand dominant .   ROS: See pertinent positives and negatives per HPI.  Past Medical History:  Diagnosis Date  . Allergic rhinitis   . Anxiety   . Detached retina   . Heart murmur    has had one when a child and checked out ok  never? had an echo  . History of chicken pox   . Hx of nonmelanoma skin cancer   . Hypertension     Past Surgical History:  Procedure Laterality Date  . CATARACT EXTRACTION  2009   [facility]    Family History  Problem Relation Age of Onset  . Arthritis Mother   . Hypertension Mother   . Arthritis Father   . Hypertension Father         Current Outpatient Medications:  .  chlorpheniramine (CHLOR-TRIMETON) 4 MG tablet, Take 4 mg by mouth 2 (two) times daily as needed for allergies., Disp: , Rfl:  .  diphenhydrAMINE (BENADRYL) 25 MG tablet, Take 25 mg by mouth every 6 (six) hours as needed., Disp: , Rfl:  .  lisinopril (ZESTRIL) 20 MG tablet, TAKE 1 TABLET BY MOUTH EVERY DAY,  Disp: 90 tablet, Rfl: 3 .  metoprolol tartrate (LOPRESSOR) 25 MG tablet, TAKE 1 TABLET BY MOUTH TWICE A DAY, Disp: 180 tablet, Rfl: 3  EXAM: BP Readings from Last 3 Encounters:  10/30/18 136/78  10/27/17 (!) 151/77  10/21/16 138/80    VITALS per patient if applicable:  GENERAL: alert, oriented, appears well and in no acute distress  HEENT: atraumatic, conjunttiva clear, no obvious abnormalities on inspection of external nose and ears  NECK: normal movements of the head and neck  LUNGS: on inspection no signs of respiratory distress, breathing rate appears normal, no obvious gross SOB, gasping or wheezing  CV: no obvious cyanosis  MS:     Left  Hand middle  finfer with flexion without acute swelling of discoloration     Had to stretch and  maneuver to straighten  PSYCH/NEURO: pleasant and cooperative, no obvious depression or anxiety, speech and thought processing grossly intact   ASSESSMENT AND PLAN:  Discussed the following assessment and plan:    ICD-10-CM   1. Trigger middle finger of left hand  M65.332     Counseled.   Referral to hand specilaist    Expectant management  About  intervnetions and discussion of plan and treatment with opportunity to ask questions and all were answered. The patient agreed with the  plan and demonstrated an understanding of the instructions.   Advised to call back or seek an in-person evaluation if worsening  or having  further concerns . In interin Return for when planned.  Shanon Ace, MD

## 2019-07-11 ENCOUNTER — Other Ambulatory Visit: Payer: Self-pay

## 2019-07-11 ENCOUNTER — Encounter: Payer: Self-pay | Admitting: Internal Medicine

## 2019-07-11 ENCOUNTER — Telehealth (INDEPENDENT_AMBULATORY_CARE_PROVIDER_SITE_OTHER): Payer: Medicare Other | Admitting: Internal Medicine

## 2019-07-11 DIAGNOSIS — M65332 Trigger finger, left middle finger: Secondary | ICD-10-CM

## 2019-07-18 DIAGNOSIS — M1812 Unilateral primary osteoarthritis of first carpometacarpal joint, left hand: Secondary | ICD-10-CM | POA: Diagnosis not present

## 2019-07-18 DIAGNOSIS — M65332 Trigger finger, left middle finger: Secondary | ICD-10-CM | POA: Diagnosis not present

## 2019-07-19 DIAGNOSIS — M65332 Trigger finger, left middle finger: Secondary | ICD-10-CM | POA: Diagnosis not present

## 2019-10-10 ENCOUNTER — Other Ambulatory Visit: Payer: Self-pay | Admitting: Internal Medicine

## 2019-10-31 ENCOUNTER — Other Ambulatory Visit: Payer: Self-pay

## 2019-11-01 ENCOUNTER — Encounter: Payer: Self-pay | Admitting: Internal Medicine

## 2019-11-01 ENCOUNTER — Ambulatory Visit (INDEPENDENT_AMBULATORY_CARE_PROVIDER_SITE_OTHER): Payer: Medicare Other | Admitting: Internal Medicine

## 2019-11-01 VITALS — BP 148/78 | HR 70 | Temp 97.7°F | Ht 61.75 in | Wt 153.6 lb

## 2019-11-01 DIAGNOSIS — I10 Essential (primary) hypertension: Secondary | ICD-10-CM

## 2019-11-01 DIAGNOSIS — Z79899 Other long term (current) drug therapy: Secondary | ICD-10-CM | POA: Diagnosis not present

## 2019-11-01 DIAGNOSIS — Z0000 Encounter for general adult medical examination without abnormal findings: Secondary | ICD-10-CM | POA: Diagnosis not present

## 2019-11-01 DIAGNOSIS — R7301 Impaired fasting glucose: Secondary | ICD-10-CM

## 2019-11-01 DIAGNOSIS — D7589 Other specified diseases of blood and blood-forming organs: Secondary | ICD-10-CM | POA: Diagnosis not present

## 2019-11-01 LAB — CBC WITH DIFFERENTIAL/PLATELET
Basophils Absolute: 0 10*3/uL (ref 0.0–0.1)
Basophils Relative: 0.4 % (ref 0.0–3.0)
Eosinophils Absolute: 0.2 10*3/uL (ref 0.0–0.7)
Eosinophils Relative: 3.6 % (ref 0.0–5.0)
HCT: 42.1 % (ref 39.0–52.0)
Hemoglobin: 14.2 g/dL (ref 13.0–17.0)
Lymphocytes Relative: 17.9 % (ref 12.0–46.0)
Lymphs Abs: 1 10*3/uL (ref 0.7–4.0)
MCHC: 33.7 g/dL (ref 30.0–36.0)
MCV: 106.9 fl — ABNORMAL HIGH (ref 78.0–100.0)
Monocytes Absolute: 0.7 10*3/uL (ref 0.1–1.0)
Monocytes Relative: 12.9 % — ABNORMAL HIGH (ref 3.0–12.0)
Neutro Abs: 3.7 10*3/uL (ref 1.4–7.7)
Neutrophils Relative %: 65.2 % (ref 43.0–77.0)
Platelets: 262 10*3/uL (ref 150.0–400.0)
RBC: 3.94 Mil/uL — ABNORMAL LOW (ref 4.22–5.81)
RDW: 14.8 % (ref 11.5–15.5)
WBC: 5.7 10*3/uL (ref 4.0–10.5)

## 2019-11-01 LAB — HEPATIC FUNCTION PANEL
ALT: 20 U/L (ref 0–53)
AST: 19 U/L (ref 0–37)
Albumin: 4.4 g/dL (ref 3.5–5.2)
Alkaline Phosphatase: 57 U/L (ref 39–117)
Bilirubin, Direct: 0.1 mg/dL (ref 0.0–0.3)
Total Bilirubin: 0.6 mg/dL (ref 0.2–1.2)
Total Protein: 6.8 g/dL (ref 6.0–8.3)

## 2019-11-01 LAB — BASIC METABOLIC PANEL
BUN: 16 mg/dL (ref 6–23)
CO2: 27 mEq/L (ref 19–32)
Calcium: 9.4 mg/dL (ref 8.4–10.5)
Chloride: 103 mEq/L (ref 96–112)
Creatinine, Ser: 0.74 mg/dL (ref 0.40–1.50)
GFR: 102.99 mL/min (ref >60.00)
Glucose, Bld: 107 mg/dL — ABNORMAL HIGH (ref 70–99)
Potassium: 4.4 mEq/L (ref 3.5–5.1)
Sodium: 139 mEq/L (ref 135–145)

## 2019-11-01 LAB — LIPID PANEL
Cholesterol: 201 mg/dL — ABNORMAL HIGH (ref 0–200)
HDL: 37.8 mg/dL — ABNORMAL LOW (ref >39.00)
LDL Cholesterol: 137 mg/dL — ABNORMAL HIGH (ref 0–99)
NonHDL: 162.84
Total CHOL/HDL Ratio: 5
Triglycerides: 130 mg/dL (ref 0.0–149.0)
VLDL: 26 mg/dL (ref 0.0–40.0)

## 2019-11-01 LAB — VITAMIN B12: Vitamin B-12: <50 pg/mL

## 2019-11-01 LAB — HEMOGLOBIN A1C: Hgb A1c MFr Bld: 5.8 % (ref 4.6–6.5)

## 2019-11-01 NOTE — Progress Notes (Signed)
Chief Complaint  Patient presents with  . Annual Exam    Doing okay, has been a stressful year  . Medication Refill  . Hypertension    HPI: Samuel Pearson 74 y.o. comes in today for Preventive Medicare exam/ wellness visit .and medication  BP seems 130 not  Checking a lot  Rough year  Taking beet juice to help BP  Mom passes in November. In  [place] Maintenance  Topic Date Due  . INFLUENZA VACCINE  12/08/2019  . TETANUS/TDAP  03/19/2023  . COLONOSCOPY  11/19/2026  . COVID-19 Vaccine  Completed  . Hepatitis C Screening  Completed  . PNA vac Low Risk Adult  Completed   Health Maintenance Review LIFESTYLE:  Exercise:   Less this year  covid  Without antihistamine  Went off to aovid covid but had allergy sx this season  . Less sports  Tobacco/ETS: no Alcohol:   Less  Sugar beverages:  no Sleep:  To many  Bored  With  naps  Drug use: supplements  Not  HH:  2  Lots of dogs     Hearing:  ? If need hearing aid   Vision:  No limitations at present . Last eye check UTD  Hx removal cataracts   [place]   Safety:  Has smoke detector and wears seat belts.  s. No excess sun exposure. Sees dentist regularly.  Falls: n  Advance directive :  Reviewed  Has one.  Memory: Felt to be good  , no concern from  family.  Depression: No anhedonia unusual crying or depressive symptoms  Nutrition: Eats well balanced diet; adequate calcium and vitamin D. No swallowing chewing problems.  Injury: no major injuries in the last six months.  Other healthcare providers:  Reviewed today .  Preventive parameters: up-to-date  Reviewed   ADLS:   There are no problems or need for assistance  driving, feeding, obtaining food, dressing, toileting and bathing, managing money using phone.  is independent.    ROS:  Some general fatigue  Cut out beer and chips snack for cnetral weight  May have swelling  Inguinal area  Denies  "prostate sx"  GEN/ HEENT: No fever, significant weight changes  sweats headaches vision problems hearing changes, CV/ PULM; No chest pain shortness of breath cough, syncope,edema  change in exercise tolerance. GI /GU: No adominal pain, vomiting, change in bowel habits. No blood in the stool. No significant GU symptoms. SKIN/HEME: ,no acute skin rashes suspicious lesions or bleeding. No lymphadenopathy, nodules, masses.  NEURO/ PSYCH:  No neurologic signs such as weakness numbness. No depression anxiety. IMM/ Allergy: No unusual infections.  Allergy .   REST of 12 system review negative except as per HPI   Past Medical History:  Diagnosis Date  . Allergic rhinitis   . Anxiety   . Detached retina   . Heart murmur    has had one when a child and checked out ok  never? had an echo  . History of chicken pox   . Hx of nonmelanoma skin cancer   . Hypertension     Family History  Problem Relation Age of Onset  . Arthritis Mother   . Hypertension Mother   . Arthritis Father   . Hypertension Father     Social History   Socioeconomic History  . Marital status: Married    Spouse name: Not on file  . Number of children: Not on file  . Years of education: Not  on file  . Highest education level: Not on file  Occupational History  . Not on file  Tobacco Use  . Smoking status: Never Smoker  . Smokeless tobacco: Never Used  Vaping Use  . Vaping Use: Never used  Substance and Sexual Activity  . Alcohol use: Yes    Alcohol/week: 14.0 - 21.0 standard drinks    Types: 14 - 21 Cans of beer per week  . Drug use: No  . Sexual activity: Not on file  Other Topics Concern  . Not on file  Social History Narrative   Retired Film/video editor from [place]   Married   Some Caffeine   Helps take care of grandkids   recently moved residence    Social Determinants of Radio broadcast assistant Strain:   . Difficulty of Paying Living Expenses:   Food Insecurity:   . Worried About Charity fundraiser in the Last Year:   . Academic librarian in the Last Year:   Transportation Needs:   . Film/video editor (Medical):   Marland Kitchen Lack of Transportation (Non-Medical):   Physical Activity:   . Days of Exercise per Week:   . Minutes of Exercise per Session:   Stress:   . Feeling of Stress :   Social Connections:   . Frequency of Communication with Friends and Family:   . Frequency of Social Gatherings with Friends and Family:   . Attends Religious Services:   . Active Member of Clubs or Organizations:   . Attends Archivist Meetings:   Marland Kitchen Marital Status:     Outpatient Encounter Medications as of 11/01/2019  Medication Sig  . chlorpheniramine (CHLOR-TRIMETON) 4 MG tablet Take 4 mg by mouth 2 (two) times daily as needed for allergies.  . diphenhydrAMINE (BENADRYL) 25 MG tablet Take 25 mg by mouth every 6 (six) hours as needed.  Marland Kitchen lisinopril (ZESTRIL) 20 MG tablet TAKE 1 TABLET BY MOUTH EVERY DAY  . metoprolol tartrate (LOPRESSOR) 25 MG tablet TAKE 1 TABLET BY MOUTH TWICE A DAY  . niacin 500 MG tablet Take by mouth.  Marland Kitchen aspirin 81 MG EC tablet Take by mouth. (Patient not taking: Reported on 11/01/2019)   No facility-administered encounter medications on file as of 11/01/2019.    EXAM:  BP (!) 148/78   Pulse 70   Temp 97.7 F (36.5 C) (Temporal)   Ht 5' 1.75" (1.568 m)   Wt 153 lb 9.6 oz (69.7 kg)   SpO2 98%   BMI 28.32 kg/m   Body mass index is 28.32 kg/m.  Physical Exam: Vital signs reviewed MCN:OBSJ is a well-developed well-nourished alert cooperative   who appears stated age in no acute distress.  HEENT: normocephalic atraumatic , Eyes: PERRL EOM's full, conjunctiva clear, Nares: paten,t no deformity discharge or tenderness., Ears: no deformity EAC'sright wax lef clear  TMs with normal landmarks. Mouth:masked  NECK: supple without masses, thyromegaly or bruits. CHEST/PULM:  Clear to auscultation and percussion breath sounds equal no wheeze , rales or rhonchi. No chest wall deformities or  tenderness. CV: PMI is nondisplaced, S1 S2 no gallops,, rubs ongoing hum upper chest right more than left . Peripheral pulses are full without delay.No JVD .  ABDOMEN: Bowel sounds normal nontender  No guard or rebound, no hepato splenomegal no CVA tenderness.    Prominent bulgest in suprainguinal areas   ? If hernia  reducable no pain or redness more when standing  Extremtities:  No clubbing cyanosis or edema, no acute joint swelling or redness no focal atrophy NEURO:  Oriented x3, cranial nerves 3-12 appear to be intact, no obvious focal weakness,gait within normal limits no abnormal reflexes or asymmetrical SKIN: No acute rashes normal turgor, color, no bruising or petechiae. Sun changes has sks  PSYCH: Oriented, good eye contact, no obvious depression anxiety, cognition and judgment appear normal. LN: no cervical axillary inguinal adenopathy No noted deficits in memory, attention, and speech.   Lab Results  Component Value Date   WBC 5.7 11/01/2019   HGB 14.2 11/01/2019   HCT 42.1 11/01/2019   PLT 262.0 11/01/2019   GLUCOSE 107 (H) 11/01/2019   CHOL 201 (H) 11/01/2019   TRIG 130.0 11/01/2019   HDL 37.80 (L) 11/01/2019   LDLCALC 137 (H) 11/01/2019   ALT 20 11/01/2019   AST 19 11/01/2019   NA 139 11/01/2019   K 4.4 11/01/2019   CL 103 11/01/2019   CREATININE 0.74 11/01/2019   BUN 16 11/01/2019   CO2 27 11/01/2019   TSH 4.42 10/14/2015   PSA 1.90 10/14/2015   HGBA1C 5.8 11/01/2019    ASSESSMENT AND PLAN:  Discussed the following assessment and plan:  Visit for preventive health examination  Medication management - Plan: Basic metabolic panel, CBC with Differential/Platelet, Hemoglobin A1c, Hepatic function panel, Lipid panel, Vitamin B12  Essential hypertension - up readings today  check at home to ensure at goal  - Plan: Basic metabolic panel, CBC with Differential/Platelet, Hemoglobin A1c, Hepatic function panel, Lipid panel, Vitamin B12  Fasting hyperglycemia - Plan:  Basic metabolic panel, CBC with Differential/Platelet, Hemoglobin A1c, Hepatic function panel, Lipid panel, Vitamin B12  Increased MCV - Plan: Vitamin B12 Lab monitoring and risk  bp control exercise sleep and  Lipid meds   As in past  emphaiszed if any sx  More aggressive approach and no longer screening  Primary prevention  If he wishes  cac score or cv risk assessment since he asks about Possible disc again   Poss hernia  If so then  No sx  Disc and consider more eval if needed pain etc    He will monitor   Patient Care Team: Burnis Medin, MD as PCP - [facility] [place], DO (Dermatology)  Patient Instructions  Check bp readings at home to make sure   In range .  Will notify you  of labs when available.  Continue lifestyle intervention healthy eating and exercise .  Shingrix vaccine whenen  Convenient.   Coronary artery calcium score.   If you wish    Health Maintenance, Male Adopting a healthy lifestyle and getting preventive care are important in promoting health and wellness. Ask your health care provider about:  The right schedule for you to have regular tests and exams.  Things you can do on your own to prevent diseases and keep yourself healthy. What should I know about diet, weight, and exercise? Eat a healthy diet   Eat a diet that includes plenty of vegetables, fruits, low-fat dairy products, and lean protein.  Do not eat a lot of foods that are high in solid fats, added sugars, or sodium. Maintain a healthy weight Body mass index (BMI) is a measurement that can be used to identify possible weight problems. It estimates body fat based on height and weight. Your health care provider can help determine your BMI and help you achieve or maintain a healthy weight. Get regular exercise Get regular exercise.  This is one of the most important things you can do for your health. Most adults should:  Exercise for at least 150 minutes each week. The  exercise should increase your heart rate and make you sweat (moderate-intensity exercise).  Do strengthening exercises at least twice a week. This is in addition to the moderate-intensity exercise.  Spend less time sitting. Even light physical activity can be beneficial. Watch cholesterol and blood lipids Have your blood tested for lipids and cholesterol at 75 years of age, then have this test every 5 years. You may need to have your cholesterol levels checked more often if:  Your lipid or cholesterol levels are high.  You are older than 76 years of age.  You are at high risk for heart disease. What should I know about cancer screening? Many types of cancers can be detected early and may often be prevented. Depending on your health history and family history, you may need to have cancer screening at various ages. This may include screening for:  Colorectal cancer.  Prostate cancer.  Skin cancer.  Lung cancer. What should I know about heart disease, diabetes, and high blood pressure? Blood pressure and heart disease  High blood pressure causes heart disease and increases the risk of stroke. This is more likely to develop in people who have high blood pressure readings, are of African descent, or are overweight.  Talk with your health care provider about your target blood pressure readings.  Have your blood pressure checked: ? Every 3-5 years if you are 21-40 years of age. ? Every year if you are 89 years old or older.  If you are between the ages of 52 and 5 and are a current or former smoker, ask your health care provider if you should have a one-time screening for abdominal aortic aneurysm (AAA). Diabetes Have regular diabetes screenings. This checks your fasting blood sugar level. Have the screening done:  Once every three years after age 47 if you are at a normal weight and have a low risk for diabetes.  More often and at a younger age if you are overweight or have a high  risk for diabetes. What should I know about preventing infection? Hepatitis B If you have a higher risk for hepatitis B, you should be screened for this virus. Talk with your health care provider to find out if you are at risk for hepatitis B infection. Hepatitis C Blood testing is recommended for:  Everyone born from [birth year] through 1966.  Anyone with known risk factors for hepatitis C. Sexually transmitted infections (STIs)  You should be screened each year for STIs, including gonorrhea and chlamydia, if: ? You are sexually active and are younger than 75 years of age. ? You are older than 75 years of age and your health care provider tells you that you are at risk for this type of infection. ? Your sexual activity has changed since you were last screened, and you are at increased risk for chlamydia or gonorrhea. Ask your health care provider if you are at risk.  Ask your health care provider about whether you are at high risk for HIV. Your health care provider may recommend a prescription medicine to help prevent HIV infection. If you choose to take medicine to prevent HIV, you should first get tested for HIV. You should then be tested every 3 months for as long as you are taking the medicine. Follow these instructions at home: Lifestyle  Do not use any products  that contain nicotine or tobacco, such as cigarettes, e-cigarettes, and chewing tobacco. If you need help quitting, ask your health care provider.  Do not use street drugs.  Do not share needles.  Ask your health care provider for help if you need support or information about quitting drugs. Alcohol use  Do not drink alcohol if your health care provider tells you not to drink.  If you drink alcohol: ? Limit how much you have to 0-2 drinks a day. ? Be aware of how much alcohol is in your drink. In the [place] one drink equals one 12 oz bottle of beer (355 mL), one 5 oz glass of wine (148 mL), or one 1 oz glass of hard liquor  (44 mL). General instructions  Schedule regular health, dental, and eye exams.  Stay current with your vaccines.  Tell your health care provider if: ? You often feel depressed. ? You have ever been abused or do not feel safe at home. Summary  Adopting a healthy lifestyle and getting preventive care are important in promoting health and wellness.  Follow your health care provider's instructions about healthy diet, exercising, and getting tested or screened for diseases.  Follow your health care provider's instructions on monitoring your cholesterol and blood pressure. This information is not intended to replace advice given to you by your health care provider. Make sure you discuss any questions you have with your health care provider. Document Revised: 04/18/2018 Document Reviewed: 04/18/2018 Elsevier Patient Education  2020 Lane Maren Wiesen M.D.

## 2019-11-01 NOTE — Patient Instructions (Addendum)
Check bp readings at home to make sure   In range .  Will notify you  of labs when available.  Continue lifestyle intervention healthy eating and exercise .  Shingrix vaccine whenen  Convenient.   Coronary artery calcium score.   If you wish    Health Maintenance, Male Adopting a healthy lifestyle and getting preventive care are important in promoting health and wellness. Ask your health care provider about:  The right schedule for you to have regular tests and exams.  Things you can do on your own to prevent diseases and keep yourself healthy. What should I know about diet, weight, and exercise? Eat a healthy diet   Eat a diet that includes plenty of vegetables, fruits, low-fat dairy products, and lean protein.  Do not eat a lot of foods that are high in solid fats, added sugars, or sodium. Maintain a healthy weight Body mass index (BMI) is a measurement that can be used to identify possible weight problems. It estimates body fat based on height and weight. Your health care provider can help determine your BMI and help you achieve or maintain a healthy weight. Get regular exercise Get regular exercise. This is one of the most important things you can do for your health. Most adults should:  Exercise for at least 150 minutes each week. The exercise should increase your heart rate and make you sweat (moderate-intensity exercise).  Do strengthening exercises at least twice a week. This is in addition to the moderate-intensity exercise.  Spend less time sitting. Even light physical activity can be beneficial. Watch cholesterol and blood lipids Have your blood tested for lipids and cholesterol at 75 years of age, then have this test every 5 years. You may need to have your cholesterol levels checked more often if:  Your lipid or cholesterol levels are high.  You are older than 75 years of age.  You are at high risk for heart disease. What should I know about cancer  screening? Many types of cancers can be detected early and may often be prevented. Depending on your health history and family history, you may need to have cancer screening at various ages. This may include screening for:  Colorectal cancer.  Prostate cancer.  Skin cancer.  Lung cancer. What should I know about heart disease, diabetes, and high blood pressure? Blood pressure and heart disease  High blood pressure causes heart disease and increases the risk of stroke. This is more likely to develop in people who have high blood pressure readings, are of African descent, or are overweight.  Talk with your health care provider about your target blood pressure readings.  Have your blood pressure checked: ? Every 3-5 years if you are 14-71 years of age. ? Every year if you are 54 years old or older.  If you are between the ages of 40 and 81 and are a current or former smoker, ask your health care provider if you should have a one-time screening for abdominal aortic aneurysm (AAA). Diabetes Have regular diabetes screenings. This checks your fasting blood sugar level. Have the screening done:  Once every three years after age 81 if you are at a normal weight and have a low risk for diabetes.  More often and at a younger age if you are overweight or have a high risk for diabetes. What should I know about preventing infection? Hepatitis B If you have a higher risk for hepatitis B, you should be screened for this virus. Talk  with your health care provider to find out if you are at risk for hepatitis B infection. Hepatitis C Blood testing is recommended for:  Everyone born from [birth year] through 1966.  Anyone with known risk factors for hepatitis C. Sexually transmitted infections (STIs)  You should be screened each year for STIs, including gonorrhea and chlamydia, if: ? You are sexually active and are younger than 75 years of age. ? You are older than 75 years of age and your health care  provider tells you that you are at risk for this type of infection. ? Your sexual activity has changed since you were last screened, and you are at increased risk for chlamydia or gonorrhea. Ask your health care provider if you are at risk.  Ask your health care provider about whether you are at high risk for HIV. Your health care provider may recommend a prescription medicine to help prevent HIV infection. If you choose to take medicine to prevent HIV, you should first get tested for HIV. You should then be tested every 3 months for as long as you are taking the medicine. Follow these instructions at home: Lifestyle  Do not use any products that contain nicotine or tobacco, such as cigarettes, e-cigarettes, and chewing tobacco. If you need help quitting, ask your health care provider.  Do not use street drugs.  Do not share needles.  Ask your health care provider for help if you need support or information about quitting drugs. Alcohol use  Do not drink alcohol if your health care provider tells you not to drink.  If you drink alcohol: ? Limit how much you have to 0-2 drinks a day. ? Be aware of how much alcohol is in your drink. In the [place] one drink equals one 12 oz bottle of beer (355 mL), one 5 oz glass of wine (148 mL), or one 1 oz glass of hard liquor (44 mL). General instructions  Schedule regular health, dental, and eye exams.  Stay current with your vaccines.  Tell your health care provider if: ? You often feel depressed. ? You have ever been abused or do not feel safe at home. Summary  Adopting a healthy lifestyle and getting preventive care are important in promoting health and wellness.  Follow your health care provider's instructions about healthy diet, exercising, and getting tested or screened for diseases.  Follow your health care provider's instructions on monitoring your cholesterol and blood pressure. This information is not intended to replace advice given  to you by your health care provider. Make sure you discuss any questions you have with your health care provider. Document Revised: 04/18/2018 Document Reviewed: 04/18/2018 Elsevier Patient Education  2020 [employer].

## 2019-11-05 ENCOUNTER — Other Ambulatory Visit: Payer: Self-pay

## 2019-11-05 DIAGNOSIS — E538 Deficiency of other specified B group vitamins: Secondary | ICD-10-CM

## 2019-11-05 NOTE — Progress Notes (Signed)
Sugar stable ,  no diabetes  but  fasting bg is slightly up.   your B12 level is as very low  :less than 50 . Advise   take b12 ,1000 mcg -2000 mcg sublingual ( dissolve in mouth) every day  ( alternative is to get injections)    Arrange Y34 level and folic acid level  and cbc diff  in 2 months   dx b12 deficiency and then rov or video  to discuss .

## 2019-12-12 ENCOUNTER — Telehealth: Payer: Self-pay | Admitting: Internal Medicine

## 2019-12-12 NOTE — Telephone Encounter (Signed)
The patient came in the office wanting to know what date he is scheduled for labs. I didn't see an appointment. I was going to schedule an appointment for labs but the patient thought that you have already scheduled it and would like for you to call him before adding him to the schedule.

## 2019-12-16 NOTE — Telephone Encounter (Signed)
Patient came in to schedule his lab appointment and advised Brooke to also schedule ROV as a virtual visit a few days after his lab appointment. Patient is scheduling at this time.

## 2019-12-17 ENCOUNTER — Other Ambulatory Visit: Payer: Self-pay

## 2019-12-17 ENCOUNTER — Other Ambulatory Visit: Payer: Medicare Other

## 2019-12-17 DIAGNOSIS — E538 Deficiency of other specified B group vitamins: Secondary | ICD-10-CM | POA: Diagnosis not present

## 2019-12-17 LAB — CBC WITH DIFFERENTIAL/PLATELET
Absolute Monocytes: 835 cells/uL (ref 200–950)
Basophils Absolute: 41 cells/uL (ref 0–200)
Basophils Relative: 0.7 %
Eosinophils Absolute: 174 cells/uL (ref 15–500)
Eosinophils Relative: 3 %
HCT: 42.9 % (ref 38.5–50.0)
Hemoglobin: 14.3 g/dL (ref 13.2–17.1)
Lymphs Abs: 1027 cells/uL (ref 850–3900)
MCH: 34.4 pg — ABNORMAL HIGH (ref 27.0–33.0)
MCHC: 33.3 g/dL (ref 32.0–36.0)
MCV: 103.1 fL — ABNORMAL HIGH (ref 80.0–100.0)
MPV: 10.8 fL (ref 7.5–12.5)
Monocytes Relative: 14.4 %
Neutro Abs: 3724 cells/uL (ref 1500–7800)
Neutrophils Relative %: 64.2 %
Platelets: 234 10*3/uL (ref 140–400)
RBC: 4.16 10*6/uL — ABNORMAL LOW (ref 4.20–5.80)
RDW: 11.6 % (ref 11.0–15.0)
Total Lymphocyte: 17.7 %
WBC: 5.8 10*3/uL (ref 3.8–10.8)

## 2019-12-17 LAB — VITAMIN B12: Vitamin B-12: 298 pg/mL (ref 200–1100)

## 2019-12-17 LAB — FOLATE: Folate: 18.2 ng/mL

## 2019-12-19 NOTE — Progress Notes (Signed)
B12 improved   Keep  fu appt  to discuss

## 2019-12-23 ENCOUNTER — Encounter: Payer: Self-pay | Admitting: Internal Medicine

## 2019-12-23 ENCOUNTER — Telehealth (INDEPENDENT_AMBULATORY_CARE_PROVIDER_SITE_OTHER): Payer: Medicare Other | Admitting: Internal Medicine

## 2019-12-23 ENCOUNTER — Other Ambulatory Visit: Payer: Self-pay

## 2019-12-23 VITALS — Ht 61.75 in | Wt 153.0 lb

## 2019-12-23 DIAGNOSIS — D7589 Other specified diseases of blood and blood-forming organs: Secondary | ICD-10-CM

## 2019-12-23 DIAGNOSIS — E538 Deficiency of other specified B group vitamins: Secondary | ICD-10-CM

## 2019-12-23 NOTE — Progress Notes (Signed)
Virtual Visit via Video Note  I connected with@ on 12/23/19 at  8:30 AM EDT by a video enabled telemedicine application and verified that I am speaking with the correct person using two identifiers. Location patient: home Location provider:work  office Persons participating in the virtual visit: patient, provider  W[place]th national recommendations  regarding COVID 19 pandemic   video visit is advised over in office visit for this patient.  Patient aware  of the limitations of evaluation and management by telemedicine and  availability of in person appointments. and agreed to proceed.   HPI: Samuel Pearson presents for video visit  Fp fu of low b12  Levels   Below 50  Has been taking 1000 mcg sublingual daily took oral once day b complex   No changes in health  Gets sinus issues rfrequency  fam hx MGM had b 12 shots  no9 new  Gi issues ROS: See pertinent positives and negatives per HPI.  Past Medical History:  Diagnosis Date  . Allergic rhinitis   . Anxiety   . Detached retina   . Heart murmur    has had one when a child and checked out ok  never? had an echo  . History of chicken pox   . Hx of nonmelanoma skin cancer   . Hypertension     Past Surgical History:  Procedure Laterality Date  . CATARACT EXTRACTION  2009   [facility]    Family History  Problem Relation Age of Onset  . Arthritis Mother   . Hypertension Mother   . Arthritis Father   . Hypertension Father     Social History   Tobacco Use  . Smoking status: Never Smoker  . Smokeless tobacco: Never Used  Vaping Use  . Vaping Use: Never used  Substance Use Topics  . Alcohol use: Yes    Alcohol/week: 14.0 - 21.0 standard drinks    Types: 14 - 21 Cans of beer per week  . Drug use: No      Current Outpatient Medications:  .  chlorpheniramine (CHLOR-TRIMETON) 4 MG tablet, Take 4 mg by mouth 2 (two) times daily as needed for allergies., Disp: , Rfl:  .  diphenhydrAMINE (BENADRYL) 25 MG tablet, Take  25 mg by mouth every 6 (six) hours as needed., Disp: , Rfl:  .  ketotifen (ALAWAY) 0.025 % ophthalmic solution, Apply to eye., Disp: , Rfl:  .  lisinopril (ZESTRIL) 20 MG tablet, TAKE 1 TABLET BY MOUTH EVERY DAY, Disp: 90 tablet, Rfl: 0 .  metoprolol tartrate (LOPRESSOR) 25 MG tablet, TAKE 1 TABLET BY MOUTH TWICE A DAY, Disp: 180 tablet, Rfl: 3 .  niacin 500 MG tablet, Take by mouth., Disp: , Rfl:  .  aspirin 81 MG EC tablet, Take by mouth. (Patient not taking: Reported on 11/01/2019), Disp: , Rfl:  .  Cyanocobalamin (VITAMIN B-12) 1000 MCG SUBL, Place 2 tablets (2,000 mcg total) under the tongue daily., Disp: 180 tablet, Rfl: 3  EXAM: BP Readings from Last 3 Encounters:  11/01/19 (!) 148/78  10/30/18 136/78  10/27/17 (!) 151/77   GENERAL: alert, oriented, appears well and in no acute distress  HEENT: atraumatic, conjunttiva clear, no obvious abnormalities on inspection of external nose and ears  NECK: normal movements of the head and neck  LUNGS: on inspection no signs of respiratory distress, breathing rate appears normal, no obvious gross SOB, gasping or wheezing  CV: no obvious cyanosis  MS: moves all visible extremities without noticeable abnormality  PSYCH/NEURO: pleasant and cooperative, no obvious depression or anxiety, speech and thought processing grossly intact Lab Results  Component Value Date   WBC 5.8 12/17/2019   HGB 14.3 12/17/2019   HCT 42.9 12/17/2019   PLT 234 12/17/2019   GLUCOSE 107 (H) 11/01/2019   CHOL 201 (H) 11/01/2019   TRIG 130.0 11/01/2019   HDL 37.80 (L) 11/01/2019   LDLCALC 137 (H) 11/01/2019   ALT 20 11/01/2019   AST 19 11/01/2019   NA 139 11/01/2019   K 4.4 11/01/2019   CL 103 11/01/2019   CREATININE 0.74 11/01/2019   BUN 16 11/01/2019   CO2 27 11/01/2019   TSH 4.42 10/14/2015   PSA 1.90 10/14/2015   HGBA1C 5.8 11/01/2019   Lab Results  Component Value Date   VITAMINB12 298 12/17/2019     ASSESSMENT AND PLAN:  Discussed the  following assessment and plan:    ICD-10-CM   1. B12 deficiency  E53.8 CBC with Differential/Platelet    Vitamin B12    Intrinsic Factor Antibodies    Anti-parietal antibody    Methylmalonic acid, serum  2. Increased MCV  D75.89 CBC with Differential/Platelet    Vitamin B12    Intrinsic Factor Antibodies    Anti-parietal antibody    Methylmalonic acid, serum   Goal over 400 of level    Options  Offered inject x 4 weeks and continue sl  Or increase to 2000 mcg sl per day     Plan fu labs in about 6 weeks   To include antibody studies   And S mma  Not done earlier  Has no sx of  b12 deficiency by report    But has inc mcv better    103 Counseled.   Expectant management and discussion of plan and treatment with opportunity to ask questions and all were answered. The patient agreed with the plan and demonstrated an understanding of the instructions.  ? About covid booster for immunosuppressed only  Advised to call back or seek an in-person evaluation if worsening  or having  further concerns . Return for lab in about 6 weeks and go from there .   Shanon Ace, MD

## 2019-12-29 ENCOUNTER — Other Ambulatory Visit: Payer: Self-pay | Admitting: Internal Medicine

## 2020-02-20 ENCOUNTER — Telehealth: Payer: Self-pay | Admitting: Internal Medicine

## 2020-02-20 NOTE — Telephone Encounter (Signed)
Left a detailed message at the pts home number stating the labs orders were previously entered on 8/16.  Advised pt to call for a lab appt at a convenient time for him.

## 2020-02-20 NOTE — Telephone Encounter (Signed)
Pt states provider wants him to come for labs after taking B12 for 5 weeks.  Pt request please advise when pt needs lab appt & orders placed. Pt ph [phone].

## 2020-02-21 ENCOUNTER — Other Ambulatory Visit: Payer: Medicare Other

## 2020-02-21 ENCOUNTER — Other Ambulatory Visit: Payer: Self-pay

## 2020-02-21 DIAGNOSIS — E538 Deficiency of other specified B group vitamins: Secondary | ICD-10-CM

## 2020-02-21 DIAGNOSIS — D7589 Other specified diseases of blood and blood-forming organs: Secondary | ICD-10-CM

## 2020-02-21 NOTE — Addendum Note (Signed)
Addended by: Marrion Coy on: 02/21/2020 10:21 AM   Modules accepted: Orders

## 2020-02-24 ENCOUNTER — Encounter: Payer: Self-pay | Admitting: Internal Medicine

## 2020-02-24 ENCOUNTER — Inpatient Hospital Stay (HOSPITAL_COMMUNITY)
Admission: EM | Admit: 2020-02-24 | Discharge: 2020-02-26 | DRG: 419 | Disposition: A | Discharge status: Home / Self Care | Payer: Medicare Other | Attending: Internal Medicine | Admitting: Internal Medicine

## 2020-02-24 ENCOUNTER — Other Ambulatory Visit: Payer: Self-pay

## 2020-02-24 DIAGNOSIS — R011 Cardiac murmur, unspecified: Secondary | ICD-10-CM | POA: Diagnosis present

## 2020-02-24 DIAGNOSIS — K8012 Calculus of gallbladder with acute and chronic cholecystitis without obstruction: Secondary | ICD-10-CM | POA: Diagnosis not present

## 2020-02-24 DIAGNOSIS — Z79899 Other long term (current) drug therapy: Secondary | ICD-10-CM | POA: Diagnosis not present

## 2020-02-24 DIAGNOSIS — Z20822 Contact with and (suspected) exposure to covid-19: Secondary | ICD-10-CM | POA: Diagnosis not present

## 2020-02-24 DIAGNOSIS — I351 Nonrheumatic aortic (valve) insufficiency: Secondary | ICD-10-CM | POA: Diagnosis not present

## 2020-02-24 DIAGNOSIS — R739 Hyperglycemia, unspecified: Secondary | ICD-10-CM | POA: Diagnosis not present

## 2020-02-24 DIAGNOSIS — Z85828 Personal history of other malignant neoplasm of skin: Secondary | ICD-10-CM

## 2020-02-24 DIAGNOSIS — I10 Essential (primary) hypertension: Secondary | ICD-10-CM | POA: Diagnosis not present

## 2020-02-24 DIAGNOSIS — K819 Cholecystitis, unspecified: Secondary | ICD-10-CM

## 2020-02-24 DIAGNOSIS — K810 Acute cholecystitis: Secondary | ICD-10-CM | POA: Diagnosis present

## 2020-02-24 DIAGNOSIS — D510 Vitamin B12 deficiency anemia due to intrinsic factor deficiency: Secondary | ICD-10-CM

## 2020-02-24 DIAGNOSIS — K449 Diaphragmatic hernia without obstruction or gangrene: Secondary | ICD-10-CM | POA: Diagnosis not present

## 2020-02-24 DIAGNOSIS — E7849 Other hyperlipidemia: Secondary | ICD-10-CM | POA: Diagnosis present

## 2020-02-24 DIAGNOSIS — R110 Nausea: Secondary | ICD-10-CM | POA: Diagnosis not present

## 2020-02-24 DIAGNOSIS — Z91048 Other nonmedicinal substance allergy status: Secondary | ICD-10-CM

## 2020-02-24 DIAGNOSIS — Z8261 Family history of arthritis: Secondary | ICD-10-CM

## 2020-02-24 DIAGNOSIS — K8000 Calculus of gallbladder with acute cholecystitis without obstruction: Secondary | ICD-10-CM | POA: Diagnosis not present

## 2020-02-24 DIAGNOSIS — K82A1 Gangrene of gallbladder in cholecystitis: Secondary | ICD-10-CM | POA: Diagnosis not present

## 2020-02-24 DIAGNOSIS — K8066 Calculus of gallbladder and bile duct with acute and chronic cholecystitis without obstruction: Secondary | ICD-10-CM | POA: Diagnosis not present

## 2020-02-24 DIAGNOSIS — N281 Cyst of kidney, acquired: Secondary | ICD-10-CM | POA: Diagnosis not present

## 2020-02-24 DIAGNOSIS — Z8249 Family history of ischemic heart disease and other diseases of the circulatory system: Secondary | ICD-10-CM | POA: Diagnosis not present

## 2020-02-24 HISTORY — DX: Vitamin B12 deficiency anemia due to intrinsic factor deficiency: D51.0

## 2020-02-24 LAB — CBC WITH DIFFERENTIAL/PLATELET
Absolute Monocytes: 958 cells/uL — ABNORMAL HIGH (ref 200–950)
Basophils Absolute: 50 cells/uL (ref 0–200)
Basophils Relative: 0.8 %
Eosinophils Absolute: 252 cells/uL (ref 15–500)
Eosinophils Relative: 4 %
HCT: 41.7 % (ref 38.5–50.0)
Hemoglobin: 14 g/dL (ref 13.2–17.1)
Lymphs Abs: 1266 cells/uL (ref 850–3900)
MCH: 32.5 pg (ref 27.0–33.0)
MCHC: 33.6 g/dL (ref 32.0–36.0)
MCV: 96.8 fL (ref 80.0–100.0)
MPV: 11.2 fL (ref 7.5–12.5)
Monocytes Relative: 15.2 %
Neutro Abs: 3774 cells/uL (ref 1500–7800)
Neutrophils Relative %: 59.9 %
Platelets: 228 10*3/uL (ref 140–400)
RBC: 4.31 10*6/uL (ref 4.20–5.80)
RDW: 11.1 % (ref 11.0–15.0)
Total Lymphocyte: 20.1 %
WBC: 6.3 10*3/uL (ref 3.8–10.8)

## 2020-02-24 LAB — COMPREHENSIVE METABOLIC PANEL
ALT: 28 U/L (ref 0–44)
AST: 31 U/L (ref 15–41)
Albumin: 4.2 g/dL (ref 3.5–5.0)
Alkaline Phosphatase: 59 U/L (ref 38–126)
Anion gap: 15 (ref 5–15)
BUN: 16 mg/dL (ref 8–23)
CO2: 21 mmol/L — ABNORMAL LOW (ref 22–32)
Calcium: 9.6 mg/dL (ref 8.9–10.3)
Chloride: 102 mmol/L (ref 98–111)
Creatinine, Ser: 0.88 mg/dL (ref 0.61–1.24)
GFR, Estimated: >60 mL/min (ref >60)
Glucose, Bld: 145 mg/dL — ABNORMAL HIGH (ref 70–99)
Potassium: 4 mmol/L (ref 3.5–5.1)
Sodium: 138 mmol/L (ref 135–145)
Total Bilirubin: 1.2 mg/dL (ref 0.3–1.2)
Total Protein: 7.3 g/dL (ref 6.5–8.1)

## 2020-02-24 LAB — CBC
HCT: 45 % (ref 39.0–52.0)
Hemoglobin: 14.7 g/dL (ref 13.0–17.0)
MCH: 31.9 pg (ref 26.0–34.0)
MCHC: 32.7 g/dL (ref 30.0–36.0)
MCV: 97.6 fL (ref 80.0–100.0)
Platelets: 239 10*3/uL (ref 150–400)
RBC: 4.61 MIL/uL (ref 4.22–5.81)
RDW: 11.9 % (ref 11.5–15.5)
WBC: 16.9 10*3/uL — ABNORMAL HIGH (ref 4.0–10.5)
nRBC: 0 % (ref 0.0–0.2)

## 2020-02-24 LAB — VITAMIN B12: Vitamin B-12: 376 pg/mL (ref 200–1100)

## 2020-02-24 LAB — ANTI-PARIETAL ANTIBODY: PARIETAL CELL AB SCREEN: NEGATIVE

## 2020-02-24 LAB — LIPASE, BLOOD: Lipase: 27 U/L (ref 11–51)

## 2020-02-24 LAB — INTRINSIC FACTOR ANTIBODIES: Intrinsic Factor: POSITIVE — AB

## 2020-02-24 LAB — METHYLMALONIC ACID, SERUM: Methylmalonic Acid, Quant: 403 nmol/L — ABNORMAL HIGH (ref 87–318)

## 2020-02-24 NOTE — ED Triage Notes (Signed)
Patient reports right lateral abdominal pain this morning , no emesis or diarrhea , denies fever or chills .

## 2020-02-24 NOTE — Progress Notes (Signed)
So you have antibodies to intrinsic factor with is a factor that helps absorb  B12  in the GI tract   ... 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   Your  b12 level is still not yet optimum .  But much better . We may need to boost with a few injections   to get to optimum.    Please  have him make a virtual or  preferred   in person visit to discuss ( stay on b12 every day)   Also  you can get  booster covid 6-8 months after last  covid  immunization

## 2020-02-25 ENCOUNTER — Encounter (HOSPITAL_COMMUNITY): Payer: Self-pay | Admitting: Family Medicine

## 2020-02-25 ENCOUNTER — Emergency Department (HOSPITAL_COMMUNITY): Payer: Medicare Other

## 2020-02-25 ENCOUNTER — Inpatient Hospital Stay (HOSPITAL_COMMUNITY): Payer: Medicare Other | Admitting: Certified Registered Nurse Anesthetist

## 2020-02-25 ENCOUNTER — Inpatient Hospital Stay (HOSPITAL_COMMUNITY): Payer: Medicare Other

## 2020-02-25 ENCOUNTER — Encounter (HOSPITAL_COMMUNITY)
Admission: EM | Disposition: A | Discharge status: Home / Self Care | Payer: Self-pay | Source: Home / Self Care | Attending: Internal Medicine

## 2020-02-25 DIAGNOSIS — Z85828 Personal history of other malignant neoplasm of skin: Secondary | ICD-10-CM | POA: Diagnosis not present

## 2020-02-25 DIAGNOSIS — D72829 Elevated white blood cell count, unspecified: Secondary | ICD-10-CM | POA: Insufficient documentation

## 2020-02-25 DIAGNOSIS — D510 Vitamin B12 deficiency anemia due to intrinsic factor deficiency: Secondary | ICD-10-CM | POA: Diagnosis present

## 2020-02-25 DIAGNOSIS — R739 Hyperglycemia, unspecified: Secondary | ICD-10-CM | POA: Diagnosis not present

## 2020-02-25 DIAGNOSIS — K449 Diaphragmatic hernia without obstruction or gangrene: Secondary | ICD-10-CM | POA: Diagnosis not present

## 2020-02-25 DIAGNOSIS — Z79899 Other long term (current) drug therapy: Secondary | ICD-10-CM | POA: Diagnosis not present

## 2020-02-25 DIAGNOSIS — Z8249 Family history of ischemic heart disease and other diseases of the circulatory system: Secondary | ICD-10-CM | POA: Diagnosis not present

## 2020-02-25 DIAGNOSIS — I10 Essential (primary) hypertension: Secondary | ICD-10-CM | POA: Diagnosis present

## 2020-02-25 DIAGNOSIS — E7849 Other hyperlipidemia: Secondary | ICD-10-CM | POA: Diagnosis present

## 2020-02-25 DIAGNOSIS — Z8261 Family history of arthritis: Secondary | ICD-10-CM | POA: Diagnosis not present

## 2020-02-25 DIAGNOSIS — K8000 Calculus of gallbladder with acute cholecystitis without obstruction: Secondary | ICD-10-CM | POA: Diagnosis not present

## 2020-02-25 DIAGNOSIS — N281 Cyst of kidney, acquired: Secondary | ICD-10-CM | POA: Diagnosis not present

## 2020-02-25 DIAGNOSIS — K810 Acute cholecystitis: Secondary | ICD-10-CM

## 2020-02-25 DIAGNOSIS — I351 Nonrheumatic aortic (valve) insufficiency: Secondary | ICD-10-CM

## 2020-02-25 DIAGNOSIS — Z20822 Contact with and (suspected) exposure to covid-19: Secondary | ICD-10-CM | POA: Diagnosis present

## 2020-02-25 DIAGNOSIS — K8066 Calculus of gallbladder and bile duct with acute and chronic cholecystitis without obstruction: Secondary | ICD-10-CM | POA: Diagnosis not present

## 2020-02-25 DIAGNOSIS — R110 Nausea: Secondary | ICD-10-CM | POA: Diagnosis not present

## 2020-02-25 DIAGNOSIS — K82A1 Gangrene of gallbladder in cholecystitis: Secondary | ICD-10-CM | POA: Diagnosis present

## 2020-02-25 DIAGNOSIS — Z91048 Other nonmedicinal substance allergy status: Secondary | ICD-10-CM | POA: Diagnosis not present

## 2020-02-25 DIAGNOSIS — K8012 Calculus of gallbladder with acute and chronic cholecystitis without obstruction: Secondary | ICD-10-CM | POA: Diagnosis present

## 2020-02-25 HISTORY — PX: CHOLECYSTECTOMY: SHX55

## 2020-02-25 LAB — URINALYSIS, ROUTINE W REFLEX MICROSCOPIC
Bacteria, UA: NONE SEEN
Bilirubin Urine: NEGATIVE
Glucose, UA: NEGATIVE mg/dL
Hgb urine dipstick: NEGATIVE
Ketones, ur: 20 mg/dL — AB
Leukocytes,Ua: NEGATIVE
Nitrite: NEGATIVE
Protein, ur: 30 mg/dL — AB
Specific Gravity, Urine: 1.025 (ref 1.005–1.030)
pH: 5 (ref 5.0–8.0)

## 2020-02-25 LAB — COMPREHENSIVE METABOLIC PANEL
ALT: 37 U/L (ref 0–44)
AST: 50 U/L — ABNORMAL HIGH (ref 15–41)
Albumin: 3.7 g/dL (ref 3.5–5.0)
Alkaline Phosphatase: 55 U/L (ref 38–126)
Anion gap: 16 — ABNORMAL HIGH (ref 5–15)
BUN: 14 mg/dL (ref 8–23)
CO2: 20 mmol/L — ABNORMAL LOW (ref 22–32)
Calcium: 9 mg/dL (ref 8.9–10.3)
Chloride: 102 mmol/L (ref 98–111)
Creatinine, Ser: 0.75 mg/dL (ref 0.61–1.24)
GFR, Estimated: >60 mL/min (ref >60)
Glucose, Bld: 146 mg/dL — ABNORMAL HIGH (ref 70–99)
Potassium: 4.3 mmol/L (ref 3.5–5.1)
Sodium: 138 mmol/L (ref 135–145)
Total Bilirubin: 1.1 mg/dL (ref 0.3–1.2)
Total Protein: 6.7 g/dL (ref 6.5–8.1)

## 2020-02-25 LAB — RESPIRATORY PANEL BY RT PCR (FLU A&B, COVID)
Influenza A by PCR: NEGATIVE
Influenza B by PCR: NEGATIVE
SARS Coronavirus 2 by RT PCR: NEGATIVE

## 2020-02-25 LAB — CBC
HCT: 44 % (ref 39.0–52.0)
Hemoglobin: 14.2 g/dL (ref 13.0–17.0)
MCH: 31.8 pg (ref 26.0–34.0)
MCHC: 32.3 g/dL (ref 30.0–36.0)
MCV: 98.4 fL (ref 80.0–100.0)
Platelets: 224 10*3/uL (ref 150–400)
RBC: 4.47 MIL/uL (ref 4.22–5.81)
RDW: 12.2 % (ref 11.5–15.5)
WBC: 18.8 10*3/uL — ABNORMAL HIGH (ref 4.0–10.5)
nRBC: 0 % (ref 0.0–0.2)

## 2020-02-25 LAB — ECHOCARDIOGRAM COMPLETE
Area-P 1/2: 2.83 cm2
Height: 61.75 in
S' Lateral: 3.3 cm
Weight: 2480 oz

## 2020-02-25 SURGERY — LAPAROSCOPIC CHOLECYSTECTOMY
Anesthesia: General | Site: Abdomen

## 2020-02-25 MED ORDER — DEXMEDETOMIDINE (PRECEDEX) IN NS 20 MCG/5ML (4 MCG/ML) IV SYRINGE
PREFILLED_SYRINGE | INTRAVENOUS | Status: DC | PRN
  Administered 2020-02-25: 12 ug via INTRAVENOUS

## 2020-02-25 MED ORDER — ONDANSETRON HCL 4 MG/2ML IJ SOLN
4.0000 mg | Freq: Once | INTRAMUSCULAR | Status: AC
  Administered 2020-02-25: 4 mg via INTRAVENOUS
  Filled 2020-02-25: qty 2

## 2020-02-25 MED ORDER — BUPIVACAINE HCL (PF) 0.25 % IJ SOLN
INTRAMUSCULAR | Status: AC
  Filled 2020-02-25: qty 30

## 2020-02-25 MED ORDER — OXYCODONE-ACETAMINOPHEN 5-325 MG PO TABS: 1.0000 | ORAL_TABLET | ORAL | Status: DC | PRN

## 2020-02-25 MED ORDER — PHENYLEPHRINE HCL-NACL 10-0.9 MG/250ML-% IV SOLN
INTRAVENOUS | Status: DC | PRN
  Administered 2020-02-25: 60 ug/min via INTRAVENOUS

## 2020-02-25 MED ORDER — SOD CITRATE-CITRIC ACID 500-334 MG/5ML PO SOLN
ORAL | Status: AC
  Filled 2020-02-25: qty 15

## 2020-02-25 MED ORDER — LISINOPRIL 20 MG PO TABS: 20.0000 mg | ORAL_TABLET | Freq: Every day | ORAL | Status: DC

## 2020-02-25 MED ORDER — SODIUM CHLORIDE 0.9 % IV SOLN: 1.0000 g | INTRAVENOUS | Status: DC

## 2020-02-25 MED ORDER — SUCCINYLCHOLINE CHLORIDE 200 MG/10ML IV SOSY
PREFILLED_SYRINGE | INTRAVENOUS | Status: DC | PRN
  Administered 2020-02-25: 160 mg via INTRAVENOUS

## 2020-02-25 MED ORDER — METRONIDAZOLE IN NACL 5-0.79 MG/ML-% IV SOLN
500.0000 mg | Freq: Once | INTRAVENOUS | Status: AC
  Administered 2020-02-25: 500 mg via INTRAVENOUS
  Filled 2020-02-25: qty 100

## 2020-02-25 MED ORDER — HYDROMORPHONE HCL 1 MG/ML IJ SOLN
INTRAMUSCULAR | Status: AC
  Filled 2020-02-25: qty 1

## 2020-02-25 MED ORDER — ACETAMINOPHEN 325 MG PO TABS: 650.0000 mg | ORAL_TABLET | Freq: Four times a day (QID) | ORAL | Status: DC | PRN

## 2020-02-25 MED ORDER — SOD CITRATE-CITRIC ACID 500-334 MG/5ML PO SOLN
30.0000 mL | Freq: Once | ORAL | Status: AC
  Administered 2020-02-25: 30 mL via ORAL

## 2020-02-25 MED ORDER — THIAMINE HCL 100 MG PO TABS: 100.0000 mg | ORAL_TABLET | Freq: Every day | ORAL | Status: DC

## 2020-02-25 MED ORDER — ROCURONIUM BROMIDE 10 MG/ML (PF) SYRINGE
PREFILLED_SYRINGE | INTRAVENOUS | Status: AC
  Filled 2020-02-25: qty 10

## 2020-02-25 MED ORDER — DEXAMETHASONE SODIUM PHOSPHATE 10 MG/ML IJ SOLN
INTRAMUSCULAR | Status: AC
  Filled 2020-02-25: qty 1

## 2020-02-25 MED ORDER — KETOTIFEN FUMARATE 0.025 % OP SOLN
1.0000 [drp] | Freq: Two times a day (BID) | OPHTHALMIC | Status: DC
  Administered 2020-02-25 – 2020-02-26 (×2): 1 [drp] via OPHTHALMIC
  Filled 2020-02-25: qty 5

## 2020-02-25 MED ORDER — FENTANYL CITRATE (PF) 250 MCG/5ML IJ SOLN
INTRAMUSCULAR | Status: AC
  Filled 2020-02-25: qty 5

## 2020-02-25 MED ORDER — ACETAMINOPHEN 500 MG PO TABS
ORAL_TABLET | ORAL | Status: AC
  Administered 2020-02-25: 1000 mg via ORAL
  Filled 2020-02-25: qty 2

## 2020-02-25 MED ORDER — ONDANSETRON HCL 4 MG/2ML IJ SOLN: 4.0000 mg | Freq: Once | INTRAMUSCULAR | Status: DC | PRN

## 2020-02-25 MED ORDER — IOHEXOL 300 MG/ML  SOLN
100.0000 mL | Freq: Once | INTRAMUSCULAR | Status: AC | PRN
  Administered 2020-02-25: 100 mL via INTRAVENOUS

## 2020-02-25 MED ORDER — EPHEDRINE SULFATE-NACL 50-0.9 MG/10ML-% IV SOSY
PREFILLED_SYRINGE | INTRAVENOUS | Status: DC | PRN
  Administered 2020-02-25: 10 mg via INTRAVENOUS
  Administered 2020-02-25: 15 mg via INTRAVENOUS
  Administered 2020-02-25: 10 mg via INTRAVENOUS

## 2020-02-25 MED ORDER — ONDANSETRON HCL 4 MG/2ML IJ SOLN
INTRAMUSCULAR | Status: DC | PRN
  Administered 2020-02-25: 4 mg via INTRAVENOUS

## 2020-02-25 MED ORDER — PHENYLEPHRINE HCL (PRESSORS) 10 MG/ML IV SOLN
INTRAVENOUS | Status: AC
  Filled 2020-02-25: qty 1

## 2020-02-25 MED ORDER — SODIUM CHLORIDE 0.9 % IR SOLN
Status: DC | PRN
  Administered 2020-02-25 (×2): 1000 mL

## 2020-02-25 MED ORDER — DEXAMETHASONE SODIUM PHOSPHATE 10 MG/ML IJ SOLN
INTRAMUSCULAR | Status: DC | PRN
  Administered 2020-02-25: 4 mg via INTRAVENOUS

## 2020-02-25 MED ORDER — SODIUM CHLORIDE 0.9 % IV SOLN
2.0000 g | Freq: Once | INTRAVENOUS | Status: AC
  Administered 2020-02-25: 2 g via INTRAVENOUS
  Filled 2020-02-25: qty 20

## 2020-02-25 MED ORDER — CHLORHEXIDINE GLUCONATE 0.12 % MT SOLN: 15.0000 mL | Freq: Once | OROMUCOSAL | Status: AC

## 2020-02-25 MED ORDER — SENNOSIDES-DOCUSATE SODIUM 8.6-50 MG PO TABS: 1.0000 | ORAL_TABLET | Freq: Every evening | ORAL | Status: DC | PRN

## 2020-02-25 MED ORDER — LIDOCAINE 2% (20 MG/ML) 5 ML SYRINGE
INTRAMUSCULAR | Status: DC | PRN
  Administered 2020-02-25: 100 mg via INTRAVENOUS

## 2020-02-25 MED ORDER — ACETAMINOPHEN 500 MG PO TABS: 1000.0000 mg | ORAL_TABLET | ORAL | Status: AC

## 2020-02-25 MED ORDER — MEPERIDINE HCL 25 MG/ML IJ SOLN: 6.2500 mg | INTRAMUSCULAR | Status: DC | PRN

## 2020-02-25 MED ORDER — SUGAMMADEX SODIUM 200 MG/2ML IV SOLN
INTRAVENOUS | Status: DC | PRN
  Administered 2020-02-25: 200 mg via INTRAVENOUS

## 2020-02-25 MED ORDER — ROCURONIUM BROMIDE 10 MG/ML (PF) SYRINGE
PREFILLED_SYRINGE | INTRAVENOUS | Status: DC | PRN
  Administered 2020-02-25: 20 mg via INTRAVENOUS
  Administered 2020-02-25: 30 mg via INTRAVENOUS

## 2020-02-25 MED ORDER — HYDROMORPHONE HCL 1 MG/ML IJ SOLN: 0.5000 mg | INTRAMUSCULAR | Status: DC | PRN

## 2020-02-25 MED ORDER — 0.9 % SODIUM CHLORIDE (POUR BTL) OPTIME
TOPICAL | Status: DC | PRN
  Administered 2020-02-25: 1000 mL

## 2020-02-25 MED ORDER — PHENYLEPHRINE 40 MCG/ML (10ML) SYRINGE FOR IV PUSH (FOR BLOOD PRESSURE SUPPORT)
PREFILLED_SYRINGE | INTRAVENOUS | Status: DC | PRN
  Administered 2020-02-25: 80 ug via INTRAVENOUS
  Administered 2020-02-25: 120 ug via INTRAVENOUS
  Administered 2020-02-25: 160 ug via INTRAVENOUS
  Administered 2020-02-25: 120 ug via INTRAVENOUS

## 2020-02-25 MED ORDER — THIAMINE HCL 100 MG/ML IJ SOLN
100.0000 mg | Freq: Every day | INTRAMUSCULAR | Status: DC
  Administered 2020-02-25: 100 mg via INTRAVENOUS
  Filled 2020-02-25: qty 2

## 2020-02-25 MED ORDER — METRONIDAZOLE IN NACL 5-0.79 MG/ML-% IV SOLN
500.0000 mg | Freq: Three times a day (TID) | INTRAVENOUS | Status: DC
  Administered 2020-02-25 – 2020-02-26 (×2): 500 mg via INTRAVENOUS
  Filled 2020-02-25 (×2): qty 100

## 2020-02-25 MED ORDER — MORPHINE SULFATE (PF) 4 MG/ML IV SOLN
4.0000 mg | Freq: Once | INTRAVENOUS | Status: AC
  Administered 2020-02-25: 4 mg via INTRAVENOUS
  Filled 2020-02-25: qty 1

## 2020-02-25 MED ORDER — ACETAMINOPHEN 650 MG RE SUPP: 650.0000 mg | Freq: Four times a day (QID) | RECTAL | Status: DC | PRN

## 2020-02-25 MED ORDER — LACTATED RINGERS IV SOLN: INTRAVENOUS | Status: DC

## 2020-02-25 MED ORDER — FAMOTIDINE IN NACL 20-0.9 MG/50ML-% IV SOLN
20.0000 mg | INTRAVENOUS | Status: AC
  Administered 2020-02-25: 20 mg via INTRAVENOUS
  Filled 2020-02-25: qty 50

## 2020-02-25 MED ORDER — EPHEDRINE 5 MG/ML INJ
INTRAVENOUS | Status: AC
  Filled 2020-02-25: qty 10

## 2020-02-25 MED ORDER — CHLORHEXIDINE GLUCONATE 0.12 % MT SOLN
OROMUCOSAL | Status: AC
  Administered 2020-02-25: 15 mL via OROMUCOSAL
  Filled 2020-02-25: qty 15

## 2020-02-25 MED ORDER — HYDROMORPHONE HCL 1 MG/ML IJ SOLN
0.2500 mg | INTRAMUSCULAR | Status: DC | PRN
  Administered 2020-02-25 (×2): 0.5 mg via INTRAVENOUS

## 2020-02-25 MED ORDER — METOPROLOL TARTRATE 25 MG PO TABS
25.0000 mg | ORAL_TABLET | Freq: Two times a day (BID) | ORAL | Status: DC
  Administered 2020-02-25 – 2020-02-26 (×3): 25 mg via ORAL
  Filled 2020-02-25 (×2): qty 1
  Filled 2020-02-25: qty 2

## 2020-02-25 MED ORDER — SODIUM CHLORIDE 0.9 % IV SOLN
2.0000 g | INTRAVENOUS | Status: DC
  Administered 2020-02-26: 2 g via INTRAVENOUS
  Filled 2020-02-25: qty 20

## 2020-02-25 MED ORDER — LIDOCAINE 2% (20 MG/ML) 5 ML SYRINGE
INTRAMUSCULAR | Status: AC
  Filled 2020-02-25: qty 5

## 2020-02-25 MED ORDER — BUPIVACAINE HCL (PF) 0.25 % IJ SOLN
INTRAMUSCULAR | Status: DC | PRN
  Administered 2020-02-25: 8 mL

## 2020-02-25 MED ORDER — PROPOFOL 10 MG/ML IV BOLUS
INTRAVENOUS | Status: DC | PRN
  Administered 2020-02-25: 120 mg via INTRAVENOUS

## 2020-02-25 MED ORDER — PHENYLEPHRINE 40 MCG/ML (10ML) SYRINGE FOR IV PUSH (FOR BLOOD PRESSURE SUPPORT)
PREFILLED_SYRINGE | INTRAVENOUS | Status: AC
  Filled 2020-02-25: qty 10

## 2020-02-25 MED ORDER — ONDANSETRON HCL 4 MG/2ML IJ SOLN
INTRAMUSCULAR | Status: AC
  Filled 2020-02-25: qty 2

## 2020-02-25 MED ORDER — PROPOFOL 10 MG/ML IV BOLUS
INTRAVENOUS | Status: AC
  Filled 2020-02-25: qty 20

## 2020-02-25 MED ORDER — FENTANYL CITRATE (PF) 250 MCG/5ML IJ SOLN
INTRAMUSCULAR | Status: DC | PRN
Start: 2020-02-25 — End: 2020-02-25
  Administered 2020-02-25: 50 ug via INTRAVENOUS
  Administered 2020-02-25: 150 ug via INTRAVENOUS

## 2020-02-25 SURGICAL SUPPLY — 46 items
ADH SKN CLS APL DERMABOND .7 (GAUZE/BANDAGES/DRESSINGS) ×2
APL PRP STRL LF DISP 70% ISPRP (MISCELLANEOUS) ×2
APPLIER CLIP ROT 10 11.4 M/L (STAPLE) ×4
APR CLP MED LRG 11.4X10 (STAPLE) ×2
BAG SPEC RTRVL 10 TROC 200 (ENDOMECHANICALS) ×2
BIOPATCH RED 1 DISK 7.0 (GAUZE/BANDAGES/DRESSINGS) ×2 IMPLANT
BIOPATCH RED 1IN DISK 7.0MM (GAUZE/BANDAGES/DRESSINGS) ×1
BLADE CLIPPER SURG (BLADE) IMPLANT
CANISTER SUCT 3000ML PPV (MISCELLANEOUS) ×4 IMPLANT
CHLORAPREP W/TINT 26 (MISCELLANEOUS) ×4 IMPLANT
CLIP APPLIE ROT 10 11.4 M/L (STAPLE) ×2 IMPLANT
COVER SURGICAL LIGHT HANDLE (MISCELLANEOUS) ×4 IMPLANT
DERMABOND ADVANCED (GAUZE/BANDAGES/DRESSINGS) ×2
DERMABOND ADVANCED .7 DNX12 (GAUZE/BANDAGES/DRESSINGS) ×1 IMPLANT
DRAIN CHANNEL 19F RND (DRAIN) ×3 IMPLANT
DRSG TEGADERM 4X4.75 (GAUZE/BANDAGES/DRESSINGS) ×3 IMPLANT
ELECT REM PT RETURN 9FT ADLT (ELECTROSURGICAL) ×4
ELECTRODE REM PT RTRN 9FT ADLT (ELECTROSURGICAL) ×2 IMPLANT
EVACUATOR SILICONE 100CC (DRAIN) ×3 IMPLANT
GLOVE BIO SURGEON STRL SZ8 (GLOVE) ×4 IMPLANT
GLOVE BIOGEL PI IND STRL 8 (GLOVE) ×2 IMPLANT
GLOVE BIOGEL PI INDICATOR 8 (GLOVE) ×2
GOWN STRL REUS W/ TWL LRG LVL3 (GOWN DISPOSABLE) ×5 IMPLANT
GOWN STRL REUS W/ TWL XL LVL3 (GOWN DISPOSABLE) ×2 IMPLANT
GOWN STRL REUS W/TWL LRG LVL3 (GOWN DISPOSABLE) ×12
GOWN STRL REUS W/TWL XL LVL3 (GOWN DISPOSABLE) ×4
KIT BASIN OR (CUSTOM PROCEDURE TRAY) ×4 IMPLANT
KIT TURNOVER KIT B (KITS) ×4 IMPLANT
NS IRRIG 1000ML POUR BTL (IV SOLUTION) ×4 IMPLANT
PAD ARMBOARD 7.5X6 YLW CONV (MISCELLANEOUS) ×4 IMPLANT
POUCH RETRIEVAL ECOSAC 10 (ENDOMECHANICALS) ×2 IMPLANT
POUCH RETRIEVAL ECOSAC 10MM (ENDOMECHANICALS) ×4
SCISSORS LAP 5X35 DISP (ENDOMECHANICALS) ×4 IMPLANT
SET IRRIG TUBING LAPAROSCOPIC (IRRIGATION / IRRIGATOR) ×4 IMPLANT
SET TUBE SMOKE EVAC HIGH FLOW (TUBING) ×4 IMPLANT
SLEEVE ENDOPATH XCEL 5M (ENDOMECHANICALS) ×4 IMPLANT
SUT ETHILON 2 0 FS 18 (SUTURE) ×3 IMPLANT
SUT MNCRL AB 4-0 PS2 18 (SUTURE) ×4 IMPLANT
TOWEL GREEN STERILE (TOWEL DISPOSABLE) ×4 IMPLANT
TOWEL GREEN STERILE FF (TOWEL DISPOSABLE) ×4 IMPLANT
TRAY LAPAROSCOPIC MC (CUSTOM PROCEDURE TRAY) ×4 IMPLANT
TROCAR XCEL BLUNT TIP 100MML (ENDOMECHANICALS) ×4 IMPLANT
TROCAR XCEL NON-BLD 11X100MML (ENDOMECHANICALS) ×4 IMPLANT
TROCAR XCEL NON-BLD 5MMX100MML (ENDOMECHANICALS) ×4 IMPLANT
WARMER LAPAROSCOPE (MISCELLANEOUS) ×4 IMPLANT
WATER STERILE IRR 1000ML POUR (IV SOLUTION) ×4 IMPLANT

## 2020-02-25 NOTE — H&P (View-Only) (Signed)
Reason for Consult:ab pain Referring Physician: Dr Sheran Spine is an 74 y.o. male.  HPI: 54 yom who has history of htn presents with acute onset of ruq pain yesterday at 9 am.  Never had this before. Worsened, nothing he tried at home was making better. Associated with nausea, no emesis, no fever. No appetite.  No change in bowel habits. He underwent ct scan that shows acute calculous cholecystitis, I was asked to see him.   Past Medical History:  Diagnosis Date  . Allergic rhinitis   . Anxiety   . Detached retina   . Heart murmur    has had one when a child and checked out ok  never? had an echo  . History of chicken pox   . Hx of nonmelanoma skin cancer   . Hypertension   . 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  elevated MCV  No other sx     Past Surgical History:  Procedure Laterality Date  . CATARACT EXTRACTION  2009   [facility]    Family History  Problem Relation Age of Onset  . Arthritis Mother   . Hypertension Mother   . Arthritis Father   . Hypertension Father     Social History:  reports that he has never smoked. He has never used smokeless tobacco. He reports current alcohol use of about 14.0 - 21.0 standard drinks of alcohol per week. He reports that he does not use drugs.  Allergies:  Allergies  Allergen Reactions  . Pollen Extract-Tree Extract [Pollen Extract]     Medications: I have reviewed the patient's current medications.  Results for orders placed or performed during the hospital encounter of 02/24/20 (from the past 48 hour(s))  Lipase, blood     Status: None   Collection Time: 02/24/20 10:05 PM  Result Value Ref Range   Lipase 27 11 - 51 U/L    Comment: Performed at [facility], [place] [address], [place], [place] [address]  Comprehensive metabolic panel     Status: Abnormal   Collection Time: 02/24/20 10:05 PM  Result Value Ref Range   Sodium 138 135 - 145 mmol/L    Potassium 4.0 3.5 - 5.1 mmol/L   Chloride 102 98 - 111 mmol/L   CO2 21 (L) 22 - 32 mmol/L   Glucose, Bld 145 (H) 70 - 99 mg/dL    Comment: Glucose reference range applies only to samples taken after fasting for at least 8 hours.   BUN 16 8 - 23 mg/dL   Creatinine, Ser 0.88 0.61 - 1.24 mg/dL   Calcium 9.6 8.9 - 10.3 mg/dL   Total Protein 7.3 6.5 - 8.1 g/dL   Albumin 4.2 3.5 - 5.0 g/dL   AST 31 15 - 41 U/L   ALT 28 0 - 44 U/L   Alkaline Phosphatase 59 38 - 126 U/L   Total Bilirubin 1.2 0.3 - 1.2 mg/dL   GFR, Estimated >60 >60 mL/min   Anion gap 15 5 - 15    Comment: Performed at [facility] [address], [place] [address]  CBC     Status: Abnormal   Collection Time: 02/24/20 10:05 PM  Result Value Ref Range   WBC 16.9 (H) 4.0 - 10.5 K/uL   RBC 4.61 4.22 - 5.81 MIL/uL   Hemoglobin 14.7 13.0 - 17.0 g/dL   HCT 45.0 39 - 52 %  MCV 97.6 80.0 - 100.0 fL   MCH 31.9 26.0 - 34.0 pg   MCHC 32.7 30.0 - 36.0 g/dL   RDW 11.9 11.5 - 15.5 %   Platelets 239 150 - 400 K/uL   nRBC 0.0 0.0 - 0.2 %    Comment: Performed at [facility], [place] [address] [place], [place] [address]  Urinalysis, Routine w reflex microscopic     Status: Abnormal   Collection Time: 02/25/20  2:00 AM  Result Value Ref Range   Color, Urine YELLOW YELLOW   APPearance CLEAR CLEAR   Specific Gravity, Urine 1.025 1.005 - 1.030   pH 5.0 5.0 - 8.0   Glucose, UA NEGATIVE NEGATIVE mg/dL   Hgb urine dipstick NEGATIVE NEGATIVE   Bilirubin Urine NEGATIVE NEGATIVE   Ketones, ur 20 (A) NEGATIVE mg/dL   Protein, ur 30 (A) NEGATIVE mg/dL   Nitrite NEGATIVE NEGATIVE   Leukocytes,Ua NEGATIVE NEGATIVE   RBC / HPF 0-5 0 - 5 RBC/hpf   WBC, UA 0-5 0 - 5 WBC/hpf   Bacteria, UA NONE SEEN NONE SEEN   Squamous Epithelial / LPF 0-5 0 - 5   Mucus PRESENT     Comment: Performed at [facility], [place] [address] [place], [place] [address]    CT ABDOMEN PELVIS W CONTRAST  Result Date: 02/25/2020 CLINICAL  DATA:  Right lower quadrant abdominal pain, anorexia, and nausea EXAM: CT ABDOMEN AND PELVIS WITH CONTRAST TECHNIQUE: Multidetector CT imaging of the abdomen and pelvis was performed using the standard protocol following bolus administration of intravenous contrast. CONTRAST:  12mL OMNIPAQUE IOHEXOL 300 MG/ML  SOLN COMPARISON:  None. FINDINGS: Lower chest: Patchy infiltration or atelectasis in the lung bases. Mild cardiac enlargement. Small esophageal hiatal hernia. Hepatobiliary: Distended gallbladder with mild gallbladder wall thickening and small stone. Mild pericholecystic infiltration likely representing acute cholecystitis. No bile duct dilatation. Mild diffuse fatty infiltration of the liver. Small cysts. Pancreas: Unremarkable. No pancreatic ductal dilatation or surrounding inflammatory changes. Spleen: Normal in size without focal abnormality. Adrenals/Urinary Tract: No adrenal gland nodules. Bilateral parapelvic renal cysts. Nephrograms are homogeneous and symmetrical. No hydronephrosis. Bladder is normal. Stomach/Bowel: Stomach, small bowel, and colon are not abnormally distended. Scattered stool throughout the colon. Left inguinal hernia containing a portion of the sigmoid colon. No proximal obstruction. Appendix is normal. Vascular/Lymphatic: Aortic atherosclerosis. No enlarged abdominal or pelvic lymph nodes. Reproductive: Prostate gland is diffusely enlarged. Other: No free air or free fluid in the abdomen. Fat containing hernias in the periumbilical and right groin region. Left inguinal hernia contains colon as described above. Musculoskeletal: Degenerative changes in the spine. No destructive bone lesions. Lumbar scoliosis. IMPRESSION: 1. Distended gallbladder with small stone and mild gallbladder wall thickening and pericholecystic infiltration likely representing acute cholecystitis. 2. Mild diffuse fatty infiltration of the liver. 3. Bilateral parapelvic renal cysts. 4. Left inguinal hernia  containing a portion of the sigmoid colon. No proximal obstruction. 5. Fat containing hernias in the periumbilical and right groin region. 6. Prostate gland is diffusely enlarged. 7. Patchy infiltration or atelectasis in the lung bases. 8. Small esophageal hiatal hernia. 9. Aortic atherosclerosis. Aortic Atherosclerosis (ICD10-I70.0). Electronically Signed   By: Lucienne Capers M.D.   On: 02/25/2020 02:39    Review of Systems  Constitutional: Positive for appetite change and fatigue.  Cardiovascular: Negative for chest pain.  Gastrointestinal: Positive for abdominal pain and nausea. Negative for vomiting.  Genitourinary: Negative for difficulty urinating.   Blood pressure (!) 167/63, pulse Marland Kitchen)  108, temperature 98.6 F (37 C), temperature source Oral, resp. rate 16, SpO2 95 %. Physical Exam Constitutional:      Appearance: He is well-developed.  HENT:     Head: Normocephalic and atraumatic.     Mouth/Throat:     Mouth: Mucous membranes are moist.  Eyes:     General: No scleral icterus.    Extraocular Movements: Extraocular movements intact.     Pupils: Pupils are equal, round, and reactive to light.  Cardiovascular:     Rate and Rhythm: Normal rate and regular rhythm.  Pulmonary:     Effort: Pulmonary effort is normal.     Breath sounds: Normal breath sounds.  Abdominal:     General: Bowel sounds are normal. There is no distension.     Tenderness: There is abdominal tenderness in the right upper quadrant. Positive signs include Murphy's sign.     Hernia: No hernia is present.  Skin:    General: Skin is warm and dry.     Capillary Refill: Capillary refill takes less than 2 seconds.  Neurological:     General: No focal deficit present.     Mental Status: He is alert.  Psychiatric:        Mood and Affect: Mood normal.        Behavior: Behavior normal.     Assessment/Plan: Acute cholecystitis -ct scan, exam and wbc consistent with cholecystitis -abx, npo, appreciate TRH  assistance -discussed plan for lap chole today with patient  Rolm Bookbinder 02/25/2020, 5:53 AM

## 2020-02-25 NOTE — Interval H&P Note (Signed)
History and Physical Interval Note:  02/25/2020 10:00 AM  Samuel Pearson  has presented today for surgery, with the diagnosis of acute cholecystitis.  The various methods of treatment have been discussed with the patient and family. After consideration of risks, benefits and other options for treatment, the patient has consented to  Procedure(s): LAPAROSCOPIC CHOLECYSTECTOMY WITH  POSSIBLE INTRAOPERATIVE CHOLANGIOGRAM (N/A) as a surgical intervention.  The patient's history has been reviewed, patient examined, no change in status, stable for surgery.  I have reviewed the patient's chart and labs.  Questions were answered to the patient's satisfaction.   Pt seen examined and agree Proceed laparoscopic cholecystectomy possible IOC The procedure has been discussed with the patient.  Alternative therapies have been discussed with the patient.  Operative risks include bleeding,  Infection,  Organ injury,  Nerve injury,  Blood vessel injury,  DVT,  Pulmonary embolism,  Death,  And possible reoperation.  Medical management risks include worsening of present situation.  The success of the procedure is 50 -90 % at treating patients symptoms.  The patient understands and agrees to proceed.   Turner Daniels MD

## 2020-02-25 NOTE — Anesthesia Postprocedure Evaluation (Signed)
Anesthesia Post Note  Patient: Samuel Pearson  Procedure(s) Performed: LAPAROSCOPIC CHOLECYSTECTOMY (N/A Abdomen)     Patient location during evaluation: PACU Anesthesia Type: General Level of consciousness: awake and alert Pain management: pain level controlled Vital Signs Assessment: post-procedure vital signs reviewed and stable Respiratory status: spontaneous breathing, nonlabored ventilation, respiratory function stable and patient connected to nasal cannula oxygen Cardiovascular status: blood pressure returned to baseline and stable Postop Assessment: no apparent nausea or vomiting Anesthetic complications: no   No complications documented.  Last Vitals:  Vitals:   02/25/20 1230 02/25/20 1314  BP: (!) 124/58 (!) 109/59  Pulse: 77 81  Resp: 15 18  Temp:  (!) 36.2 C  SpO2: 93% 93%    Last Pain:  Vitals:   02/25/20 1314  TempSrc:   PainSc: 4                  Nuh Lipton DAVID

## 2020-02-25 NOTE — Consult Note (Signed)
Reason for Consult:ab pain Referring Physician: Dr Sheran Spine is an 74 y.o. male.  HPI: 1 yom who has history of htn presents with acute onset of ruq pain yesterday at 9 am.  Never had this before. Worsened, nothing he tried at home was making better. Associated with nausea, no emesis, no fever. No appetite.  No change in bowel habits. He underwent ct scan that shows acute calculous cholecystitis, I was asked to see him.   Past Medical History:  Diagnosis Date  . Allergic rhinitis   . Anxiety   . Detached retina   . Heart murmur    has had one when a child and checked out ok  never? had an echo  . History of chicken pox   . Hx of nonmelanoma skin cancer   . Hypertension   . 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  elevated MCV  No other sx     Past Surgical History:  Procedure Laterality Date  . CATARACT EXTRACTION  2009   [facility]    Family History  Problem Relation Age of Onset  . Arthritis Mother   . Hypertension Mother   . Arthritis Father   . Hypertension Father     Social History:  reports that he has never smoked. He has never used smokeless tobacco. He reports current alcohol use of about 14.0 - 21.0 standard drinks of alcohol per week. He reports that he does not use drugs.  Allergies:  Allergies  Allergen Reactions  . Pollen Extract-Tree Extract [Pollen Extract]     Medications: I have reviewed the patient's current medications.  Results for orders placed or performed during the hospital encounter of 02/24/20 (from the past 48 hour(s))  Lipase, blood     Status: None   Collection Time: 02/24/20 10:05 PM  Result Value Ref Range   Lipase 27 11 - 51 U/L    Comment: Performed at [facility], [place] [address] [place], [place] [address]  Comprehensive metabolic panel     Status: Abnormal   Collection Time: 02/24/20 10:05 PM  Result Value Ref Range   Sodium 138 135 - 145 mmol/L    Potassium 4.0 3.5 - 5.1 mmol/L   Chloride 102 98 - 111 mmol/L   CO2 21 (L) 22 - 32 mmol/L   Glucose, Bld 145 (H) 70 - 99 mg/dL    Comment: Glucose reference range applies only to samples taken after fasting for at least 8 hours.   BUN 16 8 - 23 mg/dL   Creatinine, Ser 0.88 0.61 - 1.24 mg/dL   Calcium 9.6 8.9 - 10.3 mg/dL   Total Protein 7.3 6.5 - 8.1 g/dL   Albumin 4.2 3.5 - 5.0 g/dL   AST 31 15 - 41 U/L   ALT 28 0 - 44 U/L   Alkaline Phosphatase 59 38 - 126 U/L   Total Bilirubin 1.2 0.3 - 1.2 mg/dL   GFR, Estimated >60 >60 mL/min   Anion gap 15 5 - 15    Comment: Performed at [facility] [address] [place] [address]  CBC     Status: Abnormal   Collection Time: 02/24/20 10:05 PM  Result Value Ref Range   WBC 16.9 (H) 4.0 - 10.5 K/uL   RBC 4.61 4.22 - 5.81 MIL/uL   Hemoglobin 14.7 13.0 - 17.0 g/dL   HCT 45.0 39 - 52 %  MCV 97.6 80.0 - 100.0 fL   MCH 31.9 26.0 - 34.0 pg   MCHC 32.7 30.0 - 36.0 g/dL   RDW 11.9 11.5 - 15.5 %   Platelets 239 150 - 400 K/uL   nRBC 0.0 0.0 - 0.2 %    Comment: Performed at [facility], [place] [address], [place], [place] [address]  Urinalysis, Routine w reflex microscopic     Status: Abnormal   Collection Time: 02/25/20  2:00 AM  Result Value Ref Range   Color, Urine YELLOW YELLOW   APPearance CLEAR CLEAR   Specific Gravity, Urine 1.025 1.005 - 1.030   pH 5.0 5.0 - 8.0   Glucose, UA NEGATIVE NEGATIVE mg/dL   Hgb urine dipstick NEGATIVE NEGATIVE   Bilirubin Urine NEGATIVE NEGATIVE   Ketones, ur 20 (A) NEGATIVE mg/dL   Protein, ur 30 (A) NEGATIVE mg/dL   Nitrite NEGATIVE NEGATIVE   Leukocytes,Ua NEGATIVE NEGATIVE   RBC / HPF 0-5 0 - 5 RBC/hpf   WBC, UA 0-5 0 - 5 WBC/hpf   Bacteria, UA NONE SEEN NONE SEEN   Squamous Epithelial / LPF 0-5 0 - 5   Mucus PRESENT     Comment: Performed at [facility], [place] [address] [place], [place] [address]    CT ABDOMEN PELVIS W CONTRAST  Result Date: 02/25/2020 CLINICAL  DATA:  Right lower quadrant abdominal pain, anorexia, and nausea EXAM: CT ABDOMEN AND PELVIS WITH CONTRAST TECHNIQUE: Multidetector CT imaging of the abdomen and pelvis was performed using the standard protocol following bolus administration of intravenous contrast. CONTRAST:  1104mL OMNIPAQUE IOHEXOL 300 MG/ML  SOLN COMPARISON:  None. FINDINGS: Lower chest: Patchy infiltration or atelectasis in the lung bases. Mild cardiac enlargement. Small esophageal hiatal hernia. Hepatobiliary: Distended gallbladder with mild gallbladder wall thickening and small stone. Mild pericholecystic infiltration likely representing acute cholecystitis. No bile duct dilatation. Mild diffuse fatty infiltration of the liver. Small cysts. Pancreas: Unremarkable. No pancreatic ductal dilatation or surrounding inflammatory changes. Spleen: Normal in size without focal abnormality. Adrenals/Urinary Tract: No adrenal gland nodules. Bilateral parapelvic renal cysts. Nephrograms are homogeneous and symmetrical. No hydronephrosis. Bladder is normal. Stomach/Bowel: Stomach, small bowel, and colon are not abnormally distended. Scattered stool throughout the colon. Left inguinal hernia containing a portion of the sigmoid colon. No proximal obstruction. Appendix is normal. Vascular/Lymphatic: Aortic atherosclerosis. No enlarged abdominal or pelvic lymph nodes. Reproductive: Prostate gland is diffusely enlarged. Other: No free air or free fluid in the abdomen. Fat containing hernias in the periumbilical and right groin region. Left inguinal hernia contains colon as described above. Musculoskeletal: Degenerative changes in the spine. No destructive bone lesions. Lumbar scoliosis. IMPRESSION: 1. Distended gallbladder with small stone and mild gallbladder wall thickening and pericholecystic infiltration likely representing acute cholecystitis. 2. Mild diffuse fatty infiltration of the liver. 3. Bilateral parapelvic renal cysts. 4. Left inguinal hernia  containing a portion of the sigmoid colon. No proximal obstruction. 5. Fat containing hernias in the periumbilical and right groin region. 6. Prostate gland is diffusely enlarged. 7. Patchy infiltration or atelectasis in the lung bases. 8. Small esophageal hiatal hernia. 9. Aortic atherosclerosis. Aortic Atherosclerosis (ICD10-I70.0). Electronically Signed   By: Lucienne Capers M.D.   On: 02/25/2020 02:39    Review of Systems  Constitutional: Positive for appetite change and fatigue.  Cardiovascular: Negative for chest pain.  Gastrointestinal: Positive for abdominal pain and nausea. Negative for vomiting.  Genitourinary: Negative for difficulty urinating.   Blood pressure (!) 167/63, pulse Marland Kitchen)  108, temperature 98.6 F (37 C), temperature source Oral, resp. rate 16, SpO2 95 %. Physical Exam Constitutional:      Appearance: He is well-developed.  HENT:     Head: Normocephalic and atraumatic.     Mouth/Throat:     Mouth: Mucous membranes are moist.  Eyes:     General: No scleral icterus.    Extraocular Movements: Extraocular movements intact.     Pupils: Pupils are equal, round, and reactive to light.  Cardiovascular:     Rate and Rhythm: Normal rate and regular rhythm.  Pulmonary:     Effort: Pulmonary effort is normal.     Breath sounds: Normal breath sounds.  Abdominal:     General: Bowel sounds are normal. There is no distension.     Tenderness: There is abdominal tenderness in the right upper quadrant. Positive signs include Murphy's sign.     Hernia: No hernia is present.  Skin:    General: Skin is warm and dry.     Capillary Refill: Capillary refill takes less than 2 seconds.  Neurological:     General: No focal deficit present.     Mental Status: He is alert.  Psychiatric:        Mood and Affect: Mood normal.        Behavior: Behavior normal.     Assessment/Plan: Acute cholecystitis -ct scan, exam and wbc consistent with cholecystitis -abx, npo, appreciate TRH  assistance -discussed plan for lap chole today with patient  Rolm Bookbinder 02/25/2020, 5:53 AM

## 2020-02-25 NOTE — Progress Notes (Signed)
  Echocardiogram 2D Echocardiogram has been performed.  Samuel Pearson 02/25/2020, 4:22 PM

## 2020-02-25 NOTE — H&P (Signed)
History and Physical    Samuel Pearson XTG:[id number] DOB: 1945-06-23 DOA: 02/24/2020  PCP: Burnis Medin, MD   Patient coming from:   Home  Chief Complaint: Right upper abdominal pain with nausea  HPI: Samuel Pearson is a 74 y.o. male with medical history significant for HTN, allergic rhinitis, heart murmur who presents for evaluation of right upper abdominal pain that began this am.  The pain started around 9 AM this morning.  Is associated with nausea but has not had any vomiting or diarrhea.  He reports decreased appetite.  Reports pain has been coming in waves that have progressively gotten worse throughout the day.  States the pain has not radiated to his lower abdomen or his back.  States he has never had pain like this in the past.  He denies any chest pain or palpitations.  He has not had any cough or shortness of breath.  Reports having chills but has not had a fever.  He reports laying on his right side causes increased discomfort and pain.  He has not noted any relieving factors he is not taking any medications for the pain at home.  He denies any urinary frequency, hesitancy or dysuria.  He has not had any recent travel or prolonged immobilization.  He has no history of DVTs or PE.  ED Course: He is found to have an elevated white blood cell count on lab work.  A CT of his abdomen and pelvis shows acute cholecystitis. Hospitalist service admission asked to admit patient for further management and pain control.  General surgery has been consulted and will see patient this morning to plan for definitive cholecystectomy  Review of Systems:  General:  Reports chills but no fever. Denies weakness, weight loss, night sweats.  Denies dizziness. Reports decreased appetite HENT: Denies head trauma, headache, denies change in hearing, tinnitus.  Denies nasal congestion or bleeding.  Denies sore throat, sores in mouth.   Eyes: Denies blurry vision, pain in eye, drainage.  Denies  discoloration of eyes. Neck: Denies pain.  Denies swelling.  Denies pain with movement. Cardiovascular: Denies chest pain, palpitations.  Denies edema.  Denies orthopnea Respiratory: Denies shortness of breath, cough.  Denies wheezing.  Denies sputum production Gastrointestinal: Reports right upper abdominal pain. Reports nausea. No swelling.  Denies vomiting, diarrhea.  Denies melena.  Denies hematemesis. Musculoskeletal: Denies limitation of movement.  Denies deformity or swelling.  Denies pain.  Denies arthralgias or myalgias. Genitourinary: Denies pelvic pain.  Denies urinary frequency or hesitancy.  Denies dysuria.  Skin: Denies rash.  Denies petechiae, purpura, ecchymosis. Neurological: Denies headache. Denies syncope. Denies seizure activity. Denies weakness. Denies slurred speech, drooping face. Denies visual change. Psychiatric: Denies depression, anxiety.  Denies suicidal thoughts or ideation.  Denies hallucinations.  Past Medical History:  Diagnosis Date  . Allergic rhinitis   . Anxiety   . Detached retina   . Heart murmur    has had one when a child and checked out ok  never? had an echo  . History of chicken pox   . Hx of nonmelanoma skin cancer   . Hypertension   . 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  elevated MCV  No other sx     Past Surgical History:  Procedure Laterality Date  . CATARACT EXTRACTION  2009   [facility]    Social History  reports that he has never smoked. He has never used  smokeless tobacco. He reports current alcohol use of about 14.0 - 21.0 standard drinks of alcohol per week. He reports that he does not use drugs.  Allergies  Allergen Reactions  . Pollen Extract-Tree Extract [Pollen Extract]     Family History  Problem Relation Age of Onset  . Arthritis Mother   . Hypertension Mother   . Arthritis Father   . Hypertension Father      Prior to Admission medications    Medication Sig Start Date End Date Taking? Authorizing Provider  Cyanocobalamin (VITAMIN B-12) 1000 MCG SUBL Place 2 tablets (2,000 mcg total) under the tongue daily. 12/23/19  Yes Panosh, Standley Brooking, MD  diphenhydrAMINE (BENADRYL) 25 MG tablet Take 25 mg by mouth every 6 (six) hours as needed.   Yes [provider]  ketotifen (ALAWAY) 0.025 % ophthalmic solution Apply 1 drop to eye 2 (two) times daily.    Yes [provider]  lisinopril (ZESTRIL) 20 MG tablet TAKE 1 TABLET BY MOUTH EVERY DAY 10/10/19  Yes Panosh, Standley Brooking, MD  metoprolol tartrate (LOPRESSOR) 25 MG tablet TAKE 1 TABLET BY MOUTH TWICE A DAY 12/30/19  Yes Panosh, Standley Brooking, MD  aspirin 81 MG EC tablet Take by mouth. Patient not taking: Reported on 11/01/2019    [provider]  chlorpheniramine (CHLOR-TRIMETON) 4 MG tablet Take 4 mg by mouth 2 (two) times daily as needed for allergies.    [provider]  niacin 500 MG tablet Take by mouth.    [provider]    Physical Exam: Vitals:   02/24/20 2148  BP: (!) 167/63  Pulse: (!) 108  Resp: 16  Temp: 98.6 F (37 C)  TempSrc: Oral  SpO2: 95%    Constitutional: NAD, calm, comfortable Vitals:   02/24/20 2148  BP: (!) 167/63  Pulse: (!) 108  Resp: 16  Temp: 98.6 F (37 C)  TempSrc: Oral  SpO2: 95%   General: WDWN, Alert and oriented x3.  Eyes: EOMI, PERRL, lids and conjunctivae normal.  Sclera nonicteric HENT:  [place]/AT, external ears normal.  Nares patent without epistasis.  Mucous membranes are moist. Posterior pharynx clear of any exudate or lesions Neck: Soft, normal range of motion, supple, no masses, no thyromegaly.  Trachea midline Respiratory: clear to auscultation bilaterally, no wheezing, no crackles. Normal respiratory effort. No accessory muscle use.  Cardiovascular: Regular rate and rhythm, 3/6 SEM. No rubs or gallops. No extremity edema. 2+ pedal pulses. Abdomen: Soft, RUQ tenderness to palpation. Positive Murphy's sign,  nondistended, no rebound. Has voluntary guarding. Negative McBurny's point tenderness.  No masses palpated. Bowel sounds normoactive Musculoskeletal: FROM. no clubbing / cyanosis. No joint deformity upper and lower extremities. Normal muscle tone.  Skin: Warm, dry, intact no rashes, lesions, ulcers. No induration Neurologic: CN 2-12 grossly intact.  Normal speech.  Sensation intact, Strength 5/5 in all extremities.   Psychiatric: Normal judgment and insight.  Normal mood.    Labs on Admission: I have personally reviewed following labs and imaging studies  CBC: Recent Labs  Lab 02/21/20 1021 02/24/20 2205  WBC 6.3 16.9*  NEUTROABS 3,774  --   HGB 14.0 14.7  HCT 41.7 45.0  MCV 96.8 97.6  PLT 228 272    Basic Metabolic Panel: Recent Labs  Lab 02/24/20 2205  NA 138  K 4.0  CL 102  CO2 21*  GLUCOSE 145*  BUN 16  CREATININE 0.88  CALCIUM 9.6    GFR: CrCl cannot be calculated (Unknown ideal weight.).  Liver Function Tests: Recent Labs  Lab 02/24/20 2205  AST 31  ALT 28  ALKPHOS 59  BILITOT 1.2  PROT 7.3  ALBUMIN 4.2    Urine analysis:    Component Value Date/Time   COLORURINE YELLOW 02/25/2020 0200   APPEARANCEUR CLEAR 02/25/2020 0200   LABSPEC 1.025 02/25/2020 0200   PHURINE 5.0 02/25/2020 0200   GLUCOSEU NEGATIVE 02/25/2020 0200   Athena 02/25/2020 0200   HGBUR trace-intact 04/13/2009 0855   BILIRUBINUR NEGATIVE 02/25/2020 0200   BILIRUBINUR n 11/23/2011 0836   KETONESUR 20 (A) 02/25/2020 0200   PROTEINUR 30 (A) 02/25/2020 0200   UROBILINOGEN 0.2 11/23/2011 0836   UROBILINOGEN 0.2 04/13/2009 0855   NITRITE NEGATIVE 02/25/2020 0200   LEUKOCYTESUR NEGATIVE 02/25/2020 0200    Radiological Exams on Admission: CT ABDOMEN PELVIS W CONTRAST  Result Date: 02/25/2020 CLINICAL DATA:  Right lower quadrant abdominal pain, anorexia, and nausea EXAM: CT ABDOMEN AND PELVIS WITH CONTRAST TECHNIQUE: Multidetector CT imaging of the abdomen and pelvis was  performed using the standard protocol following bolus administration of intravenous contrast. CONTRAST:  128mL OMNIPAQUE IOHEXOL 300 MG/ML  SOLN COMPARISON:  None. FINDINGS: Lower chest: Patchy infiltration or atelectasis in the lung bases. Mild cardiac enlargement. Small esophageal hiatal hernia. Hepatobiliary: Distended gallbladder with mild gallbladder wall thickening and small stone. Mild pericholecystic infiltration likely representing acute cholecystitis. No bile duct dilatation. Mild diffuse fatty infiltration of the liver. Small cysts. Pancreas: Unremarkable. No pancreatic ductal dilatation or surrounding inflammatory changes. Spleen: Normal in size without focal abnormality. Adrenals/Urinary Tract: No adrenal gland nodules. Bilateral parapelvic renal cysts. Nephrograms are homogeneous and symmetrical. No hydronephrosis. Bladder is normal. Stomach/Bowel: Stomach, small bowel, and colon are not abnormally distended. Scattered stool throughout the colon. Left inguinal hernia containing a portion of the sigmoid colon. No proximal obstruction. Appendix is normal. Vascular/Lymphatic: Aortic atherosclerosis. No enlarged abdominal or pelvic lymph nodes. Reproductive: Prostate gland is diffusely enlarged. Other: No free air or free fluid in the abdomen. Fat containing hernias in the periumbilical and right groin region. Left inguinal hernia contains colon as described above. Musculoskeletal: Degenerative changes in the spine. No destructive bone lesions. Lumbar scoliosis. IMPRESSION: 1. Distended gallbladder with small stone and mild gallbladder wall thickening and pericholecystic infiltration likely representing acute cholecystitis. 2. Mild diffuse fatty infiltration of the liver. 3. Bilateral parapelvic renal cysts. 4. Left inguinal hernia containing a portion of the sigmoid colon. No proximal obstruction. 5. Fat containing hernias in the periumbilical and right groin region. 6. Prostate gland is diffusely  enlarged. 7. Patchy infiltration or atelectasis in the lung bases. 8. Small esophageal hiatal hernia. 9. Aortic atherosclerosis. Aortic Atherosclerosis (ICD10-I70.0). Electronically Signed   By: Lucienne Capers M.D.   On: 02/25/2020 02:39    Assessment/Plan Principal Problem:   Acute cholecystitis Samuel Pearson is admitted to medical/surgical floor.  General surgery, Dr. Durward Fortes, is consulted and will see patient this morning and plan for definitive treatment with cholecystectomy.  CT of the abdomen/pelvis shows acute cholecystitis Patient is placed on Rocephin and Flagyl for empiric antibiotic coverage. Pain control provided with Dilaudid every 3 hours as needed for severe pain. IV fluid hydration with LR at 100 ml/hour  Active Problems:   Essential hypertension Continue home dose of metoprolol and lisinopril.  Monitor blood pressure.    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  Asymptomatic. Check pre surgery EKG.    Leukocytosis WBC elevated.  Recheck CBC in morning.  DVT prophylaxis: SCDs for DVT prophylaxis with anticipated cholecystectomy in the next 24 hours Code Status:   Full code Family Communication:  Diagnosis and plan discussed with patient and his wife who is at the bedside.  Questions were answered.  They agree with plan.  Further recommendations to follow as clinically indicated Disposition Plan:   Patient is from:  Home  Anticipated DC to:  Home  Anticipated DC date:  Anticipate at least 2 midnight stay in the hospital for treatment of acute medical condition  Anticipated DC barriers: No barriers to discharge identified at this time  Consults called:  General surgery,  Dr. Donne Hazel consulted by ER physician and will see pt this am Admission status:  Inpatient  Yevonne Aline Kazuko Clemence MD [facility]  How to contact the [facility] Attending or Consulting provider [place] or covering provider during after hours 7P -7A,  for this patient?   1. Check the care team in [facility] and look for a) attending/consulting [facility] provider listed and b) the [facility] team listed 2. Log into [email or web address] and use 's universal password to access. If you do not have the password, please contact the hospital operator. 3. Locate the [facility] provider you are looking for under [facility] and page to a number that you can be directly reached. 4. If you still have difficulty reaching the provider, please page the [facility] (Director on Call) for the Hospitalists listed on amion for assistance.  02/25/2020, 4:19 AM

## 2020-02-25 NOTE — Transfer of Care (Signed)
Immediate Anesthesia Transfer of Care Note  Patient: Samuel Pearson  Procedure(s) Performed: LAPAROSCOPIC CHOLECYSTECTOMY (N/A Abdomen)  Patient Location: PACU  Anesthesia Type:General  Level of Consciousness: drowsy  Airway & Oxygen Therapy: Patient Spontanous Breathing and Patient connected to face mask oxygen  Post-op Assessment: Report given to RN and Post -op Vital signs reviewed and stable  Post vital signs: Reviewed and stable  Last Vitals:  Vitals Value Taken Time  BP    Temp    Pulse 64 02/25/20 1144  Resp 11 02/25/20 1144  SpO2 97 % 02/25/20 1144  Vitals shown include unvalidated device data.  Last Pain:  Vitals:   02/25/20 0840  TempSrc: Oral  PainSc:          Complications: No complications documented.

## 2020-02-25 NOTE — ED Provider Notes (Signed)
[facility] EMERGENCY DEPARTMENT Provider Note   CSN: [id number] Arrival date & time: 02/24/20  2145     History Chief Complaint  Patient presents with  . Abdominal Pain    Samuel Pearson is a 74 y.o. male.  The history is provided by the patient and medical records.  Abdominal Pain Associated symptoms: nausea     74 year old male with history of seasonal allergies, anxiety, hypertension, pernicious anemia, presenting to the ED with right-sided abdominal pain.  States this began this morning around 9 AM.  He has not been able to eat or drink throughout the rest of the day.  He reports nausea but denies vomiting.  He has not had any diarrhea or change in bowel habits.  Denies any radiation of the pain throughout the abdomen or to the back.  No change in urination.  No prior abdominal surgeries.  No medication prior to arrival.  Past Medical History:  Diagnosis Date  . Allergic rhinitis   . Anxiety   . Detached retina   . Heart murmur    has had one when a child and checked out ok  never? had an echo  . History of chicken pox   . Hx of nonmelanoma skin cancer   . Hypertension   . 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  elevated MCV  No other sx     Patient Active Problem List   Diagnosis Date Noted  . 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/24/2020  . Essential hypertension 05/17/2014  . Low HDL (under 40) 05/17/2014  . Fasting hyperglycemia 05/17/2014  . Colon cancer screening 03/18/2013  . HTN, white coat 03/18/2013  . Medication management 12/03/2011  . Heart murmur 12/03/2011  . Visit for preventive health examination 08/29/2010  . Other and unspecified hyperlipidemia 08/29/2010  . Abnormal RBC 07/28/2010  . Screening PSA (prostate specific antigen) 07/28/2010  . HYPERTENSION 01/21/2008  . Allergic rhinitis 01/21/2008  . HYPERGLYCEMIA 01/21/2008    Past Surgical History:  Procedure Laterality Date  .  CATARACT EXTRACTION  2009   [facility]       Family History  Problem Relation Age of Onset  . Arthritis Mother   . Hypertension Mother   . Arthritis Father   . Hypertension Father     Social History   Tobacco Use  . Smoking status: Never Smoker  . Smokeless tobacco: Never Used  Vaping Use  . Vaping Use: Never used  Substance Use Topics  . Alcohol use: Yes    Alcohol/week: 14.0 - 21.0 standard drinks    Types: 14 - 21 Cans of beer per week  . Drug use: No    Home Medications Prior to Admission medications   Medication Sig Start Date End Date Taking? Authorizing Provider  aspirin 81 MG EC tablet Take by mouth. Patient not taking: Reported on 11/01/2019    [provider]  chlorpheniramine (CHLOR-TRIMETON) 4 MG tablet Take 4 mg by mouth 2 (two) times daily as needed for allergies.    [provider]  Cyanocobalamin (VITAMIN B-12) 1000 MCG SUBL Place 2 tablets (2,000 mcg total) under the tongue daily. 12/23/19   Panosh, Standley Brooking, MD  diphenhydrAMINE (BENADRYL) 25 MG tablet Take 25 mg by mouth every 6 (six) hours as needed.    [provider]  ketotifen (ALAWAY) 0.025 % ophthalmic solution Apply to eye.    [provider]  lisinopril (ZESTRIL) 20 MG tablet  TAKE 1 TABLET BY MOUTH EVERY DAY 10/10/19   Panosh, Standley Brooking, MD  metoprolol tartrate (LOPRESSOR) 25 MG tablet TAKE 1 TABLET BY MOUTH TWICE A DAY 12/30/19   Panosh, Standley Brooking, MD  niacin 500 MG tablet Take by mouth.    [provider]    Allergies    Pollen extract-tree extract [pollen extract]  Review of Systems   Review of Systems  Gastrointestinal: Positive for abdominal pain and nausea.  All other systems reviewed and are negative.   Physical Exam Updated Vital Signs BP (!) 167/63 (BP Location: Left Arm)   Pulse (!) 108   Temp 98.6 F (37 C) (Oral)   Resp 16   SpO2 95%   Physical Exam Vitals and nursing note reviewed.  Constitutional:      Appearance: He is  well-developed.  HENT:     Head: Normocephalic and atraumatic.  Eyes:     Conjunctiva/sclera: Conjunctivae normal.     Pupils: Pupils are equal, round, and reactive to light.  Cardiovascular:     Rate and Rhythm: Normal rate and regular rhythm.     Heart sounds: Normal heart sounds.  Pulmonary:     Effort: Pulmonary effort is normal.     Breath sounds: Normal breath sounds.  Abdominal:     General: Bowel sounds are normal.     Palpations: Abdomen is soft.     Tenderness: There is abdominal tenderness in the right lower quadrant.    Musculoskeletal:        General: Normal range of motion.     Cervical back: Normal range of motion.  Skin:    General: Skin is warm and dry.  Neurological:     Mental Status: He is alert and oriented to person, place, and time.     ED Results / Procedures / Treatments   Labs (all labs ordered are listed, but only abnormal results are displayed) Labs Reviewed  COMPREHENSIVE METABOLIC PANEL - Abnormal; Notable for the following components:      Result Value   CO2 21 (*)    Glucose, Bld 145 (*)    All other components within normal limits  CBC - Abnormal; Notable for the following components:   WBC 16.9 (*)    All other components within normal limits  LIPASE, BLOOD  URINALYSIS, ROUTINE W REFLEX MICROSCOPIC    EKG None  Radiology No results found.  Procedures Procedures (including critical care time)  Medications Ordered in ED Medications  cefTRIAXone (ROCEPHIN) 2 g in sodium chloride 0.9 % 100 mL IVPB (has no administration in time range)  metroNIDAZOLE (FLAGYL) IVPB 500 mg (has no administration in time range)  famotidine (PEPCID) IVPB 20 mg premix (has no administration in time range)  morphine 4 MG/ML injection 4 mg (4 mg Intravenous Given 02/25/20 0148)  ondansetron (ZOFRAN) injection 4 mg (4 mg Intravenous Given 02/25/20 0148)  iohexol (OMNIPAQUE) 300 MG/ML solution 100 mL (100 mLs Intravenous Contrast Given 02/25/20 0214)     ED Course  I have reviewed the triage vital signs and the nursing notes.  Pertinent labs & imaging results that were available during my care of the patient were reviewed by me and considered in my medical decision making (see chart for details).    MDM Rules/Calculators/A&P  74 y.o. M here with right sided abdominal pain since 9am this morning, + nausea without vomiting.  No prior surgeries.  He is afebrile, non-toxic.  Abdomen is TTP RLQ.  Labs reviewed, does have  leukocytosis.  Will plan for CT scan for further evaluation.  Given pain and nausea meds.  Will reassess.  2:56 AM CT with findings of likely acute cholecystitis.  Patient reassessed-- states when lying down he has more pain in upper right abdomen and flank, when sitting up it seems more right lower but that pain has resolved at this time.  Results discussed with patient and his wife.  Initially he is hesitant and wanting to go home, however I had a long discussion with patient and his wife about medical necessity for admission and general surgery consultation.  Wife spoke with patient about this privately as well, they are willing to stay.  Will consult general surgery.  Spoke with on call surgeon, Dr. Donne Hazel-- surgery team will evaluate in the AM.  Will start IV abx and admit to medicine service.  Discussed with hospitalist, Dr. Tonie Griffith-- will admit for ongoing care.  Final Clinical Impression(s) / ED Diagnoses Final diagnoses:  Cholecystitis    Rx / DC Orders ED Discharge Orders    None       Larene Pickett, PA-C 02/25/20 0931    Varney Biles, MD 02/26/20 [phone]

## 2020-02-25 NOTE — Progress Notes (Signed)
PROGRESS NOTE    Samuel Pearson  TFT:[id number] DOB: Sep 22, 1945 DOA: 02/24/2020 PCP: Burnis Medin, MD   Brief Narrative:  Pt admitted with acute abdominal pain and found to have cholecystitis, gen surg consulted with surgery today.  Assessment & Plan:   Principal Problem:   Acute cholecystitis Active Problems:   Heart murmur   Essential hypertension Acute cholecystitis Samuel Pearson is admitted to medical/surgical floor.  General surgery, Dr. Durward Fortes, is consulted and will see patient this morning and plan for definitive treatment with cholecystectomy.  CT of the abdomen/pelvis shows acute cholecystitis Patient is placed on Rocephin and Flagyl for empiric antibiotic coverage. Pain control provided with Dilaudid every 3 hours as needed for severe pain. IV fluid hydration with LR at 100 ml/hour.pt is changed to inpatient as pt is requiring iv pan meds. D/C in am.    Essential hypertension Continue home dose of metoprolol and lisinopril.  Monitor blood pressure. Lisinopril held due to soft BP's and restart once bp is baseline.    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  Asymptomatic. D/D include ischemic heart disease / alcoholic cardiomyopathy/ valvular regurgitation. Echo to be done once pt is post op to evaluate for systolic murmur in aortic/pulmonic area.   DVT prophylaxis: Resume once pt is post op. Code Status: Full code Family Communication:  Disposition Plan:   Status is: Inpatient   Dispo: The patient is from: Home              Anticipated d/c is to: Home              Anticipated d/c date is: 1 day              Patient currently is not medically stable to d/c.   Consultants:  General surgery.  Procedures:  Lap Chole. Antimicrobials: Anti-infectives (From admission, onward)   Start     Dose/Rate Route Frequency Ordered Stop   02/26/20 0430  cefTRIAXone (ROCEPHIN) 2 g in sodium chloride 0.9 % 100 mL IVPB         2 g 200 mL/hr over 30 Minutes Intravenous Every 24 hours 02/25/20 1339     02/25/20 0430  cefTRIAXone (ROCEPHIN) 1 g in sodium chloride 0.9 % 100 mL IVPB  Status:  Discontinued        1 g 200 mL/hr over 30 Minutes Intravenous Every 24 hours 02/25/20 0429 02/25/20 1339   02/25/20 0430  metroNIDAZOLE (FLAGYL) IVPB 500 mg        500 mg 100 mL/hr over 60 Minutes Intravenous Every 8 hours 02/25/20 0429     02/25/20 0315  cefTRIAXone (ROCEPHIN) 2 g in sodium chloride 0.9 % 100 mL IVPB        2 g 200 mL/hr over 30 Minutes Intravenous  Once 02/25/20 0313 02/25/20 0424   02/25/20 0315  metroNIDAZOLE (FLAGYL) IVPB 500 mg        500 mg 100 mL/hr over 60 Minutes Intravenous  Once 02/25/20 0313 02/25/20 0606      Subjective: Pt seen today and he is awaiting surgery,. Says he drinks 2 beers daily and his last drink was Sunday. He has never had any shakes or withdrawals symptom from his alcohol use and dependence. He has been drinking since a very young age.  Objective: Vitals:   02/25/20 1215 02/25/20 1230 02/25/20 1314 02/25/20 1347  BP: (!) 101/53 (!) 124/58 (!) 109/59 (!) 96/53  Pulse: 65 77  81 72  Resp: 10 15 18 18   Temp:   (!) 97.1 F (36.2 C)   TempSrc:      SpO2: 94% 93% 93% 93%  Weight:      Height:        Intake/Output Summary (Last 24 hours) at 02/25/2020 1427 Last data filed at 02/25/2020 1324 Gross per 24 hour  Intake 800 ml  Output 35 ml  Net 765 ml   Filed Weights   02/25/20 0840  Weight: 70.3 kg    Examination: General exam: Appears calm and comfortable  Respiratory system: Clear to auscultation. Respiratory effort normal. Cardiovascular system: S1 & S2 heard, RRR. No JVD, murmurs, rubs, gallops or clicks. No pedal edema. Gastrointestinal system: Abdomen is nondistended, soft and nontender. No organomegaly or masses felt. Normal bowel sounds heard. Central nervous system: Alert and oriented. No focal neurological deficits. Extremities: Symmetric 5 x 5  power. Skin: No rashes, lesions or ulcers Psychiatry: Judgement and insight appear normal. Mood & affect appropriate.   Data Reviewed: I have personally reviewed following labs and imaging studies  No intake/output data recorded. Total I/O In: 800 [I.V.:800] Out: 35 [Drains:25; Blood:10] Lab Results  Component Value Date   CREATININE 0.75 02/25/2020   CREATININE 0.88 02/24/2020   CREATININE 0.74 11/01/2019   CBC: Recent Labs  Lab 02/21/20 1021 02/24/20 2205 02/25/20 0549  WBC 6.3 16.9* 18.8*  NEUTROABS 3,774  --   --   HGB 14.0 14.7 14.2  HCT 41.7 45.0 44.0  MCV 96.8 97.6 98.4  PLT 228 239 633   Basic Metabolic Panel: Recent Labs  Lab 02/24/20 2205 02/25/20 0549  NA 138 138  K 4.0 4.3  CL 102 102  CO2 21* 20*  GLUCOSE 145* 146*  BUN 16 14  CREATININE 0.88 0.75  CALCIUM 9.6 9.0   GFR: Estimated Creatinine Clearance: 68.3 mL/min (by C-G formula based on SCr of 0.75 mg/dL). Liver Function Tests: Recent Labs  Lab 02/24/20 2205 02/25/20 0549  AST 31 50*  ALT 28 37  ALKPHOS 59 55  BILITOT 1.2 1.1  PROT 7.3 6.7  ALBUMIN 4.2 3.7   Recent Labs  Lab 02/24/20 2205  LIPASE 27   No results for input(s): AMMONIA in the last 168 hours. Coagulation Profile: No results for input(s): INR, PROTIME in the last 168 hours. Cardiac Enzymes: No results for input(s): CKTOTAL, CKMB, CKMBINDEX, TROPONINI in the last 168 hours. BNP (last 3 results) No results for input(s): PROBNP in the last 8760 hours. HbA1C: No results for input(s): HGBA1C in the last 72 hours. CBG: No results for input(s): GLUCAP in the last 168 hours. Lipid Profile: No results for input(s): CHOL, HDL, LDLCALC, TRIG, CHOLHDL, LDLDIRECT in the last 72 hours. Thyroid Function Tests: No results for input(s): TSH, T4TOTAL, FREET4, T3FREE, THYROIDAB in the last 72 hours. Anemia Panel: No results for input(s): VITAMINB12, FOLATE, FERRITIN, TIBC, IRON, RETICCTPCT in the last 72 hours. Sepsis Labs: No  results for input(s): PROCALCITON, LATICACIDVEN in the last 168 hours.  Recent Results (from the past 240 hour(s))  Respiratory Panel by RT PCR (Flu A&B, Covid) - Nasopharyngeal Swab     Status: None   Collection Time: 02/25/20  3:48 AM   Specimen: Nasopharyngeal Swab  Result Value Ref Range Status   SARS Coronavirus 2 by RT PCR NEGATIVE NEGATIVE Final    Comment: (NOTE) SARS-CoV-2 target nucleic acids are NOT DETECTED.  The SARS-CoV-2 RNA is generally detectable in upper respiratoy specimens during the acute phase  of infection. The lowest concentration of SARS-CoV-2 viral copies this assay can detect is 131 copies/mL. A negative result does not preclude SARS-Cov-2 infection and should not be used as the sole basis for treatment or other patient management decisions. A negative result may occur with  improper specimen collection/handling, submission of specimen other than nasopharyngeal swab, presence of viral mutation(s) within the areas targeted by this assay, and inadequate number of viral copies (<131 copies/mL). A negative result must be combined with clinical observations, patient history, and epidemiological information. The expected result is Negative.  Fact Sheet for Patients:  PinkCheek.be  Fact Sheet for Healthcare Providers:  GravelBags.it  This test is no t yet approved or cleared by the [place] FDA and  has been authorized for detection and/or diagnosis of SARS-CoV-2 by FDA under an Emergency Use Authorization (EUA). This EUA will remain  in effect (meaning this test can be used) for the duration of the COVID-19 declaration under Section 564(b)(1) of the Act, 21 U.S.C. section 360bbb-3(b)(1), unless the authorization is terminated or revoked sooner.     Influenza A by PCR NEGATIVE NEGATIVE Final   Influenza B by PCR NEGATIVE NEGATIVE Final    Comment: (NOTE) The Xpert Xpress SARS-CoV-2/FLU/RSV assay  is intended as an aid in  the diagnosis of influenza from Nasopharyngeal swab specimens and  should not be used as a sole basis for treatment. Nasal washings and  aspirates are unacceptable for Xpert Xpress SARS-CoV-2/FLU/RSV  testing.  Fact Sheet for Patients: PinkCheek.be  Fact Sheet for Healthcare Providers: GravelBags.it  This test is not yet approved or cleared by the [place] FDA and  has been authorized for detection and/or diagnosis of SARS-CoV-2 by  FDA under an Emergency Use Authorization (EUA). This EUA will remain  in effect (meaning this test can be used) for the duration of the  Covid-19 declaration under Section 564(b)(1) of the Act, 21  U.S.C. section 360bbb-3(b)(1), unless the authorization is  terminated or revoked. Performed at [facility], [place] [address] [place], [place] [address]          Radiology Studies: CT ABDOMEN PELVIS W CONTRAST  Result Date: 02/25/2020 CLINICAL DATA:  Right lower quadrant abdominal pain, anorexia, and nausea EXAM: CT ABDOMEN AND PELVIS WITH CONTRAST TECHNIQUE: Multidetector CT imaging of the abdomen and pelvis was performed using the standard protocol following bolus administration of intravenous contrast. CONTRAST:  184mL OMNIPAQUE IOHEXOL 300 MG/ML  SOLN COMPARISON:  None. FINDINGS: Lower chest: Patchy infiltration or atelectasis in the lung bases. Mild cardiac enlargement. Small esophageal hiatal hernia. Hepatobiliary: Distended gallbladder with mild gallbladder wall thickening and small stone. Mild pericholecystic infiltration likely representing acute cholecystitis. No bile duct dilatation. Mild diffuse fatty infiltration of the liver. Small cysts. Pancreas: Unremarkable. No pancreatic ductal dilatation or surrounding inflammatory changes. Spleen: Normal in size without focal abnormality. Adrenals/Urinary Tract: No adrenal gland nodules. Bilateral parapelvic renal  cysts. Nephrograms are homogeneous and symmetrical. No hydronephrosis. Bladder is normal. Stomach/Bowel: Stomach, small bowel, and colon are not abnormally distended. Scattered stool throughout the colon. Left inguinal hernia containing a portion of the sigmoid colon. No proximal obstruction. Appendix is normal. Vascular/Lymphatic: Aortic atherosclerosis. No enlarged abdominal or pelvic lymph nodes. Reproductive: Prostate gland is diffusely enlarged. Other: No free air or free fluid in the abdomen. Fat containing hernias in the periumbilical and right groin region. Left inguinal hernia contains colon as described above. Musculoskeletal: Degenerative changes in the spine. No destructive bone lesions. Lumbar scoliosis. IMPRESSION: 1.  Distended gallbladder with small stone and mild gallbladder wall thickening and pericholecystic infiltration likely representing acute cholecystitis. 2. Mild diffuse fatty infiltration of the liver. 3. Bilateral parapelvic renal cysts. 4. Left inguinal hernia containing a portion of the sigmoid colon. No proximal obstruction. 5. Fat containing hernias in the periumbilical and right groin region. 6. Prostate gland is diffusely enlarged. 7. Patchy infiltration or atelectasis in the lung bases. 8. Small esophageal hiatal hernia. 9. Aortic atherosclerosis. Aortic Atherosclerosis (ICD10-I70.0). Electronically Signed   By: Lucienne Capers M.D.   On: 02/25/2020 02:39        Scheduled Meds: . HYDROmorphone      . ketotifen  1 drop Both Eyes BID  . lisinopril  20 mg Oral Daily  . metoprolol tartrate  25 mg Oral BID  . sodium citrate-citric acid       Continuous Infusions: . [START ON 02/26/2020] cefTRIAXone (ROCEPHIN)  IV    . lactated ringers 100 mL/hr at 02/25/20 1340  . metronidazole       LOS: 0 days    Time spent: [place], MD [facility] Pager [phone] If 7PM-7AM, please contact night-coverage [email or web address] Password [email or web address] 02/25/2020, 2:27 PM

## 2020-02-25 NOTE — ED Notes (Signed)
Patient transported to CT 

## 2020-02-25 NOTE — Op Note (Signed)
Laparoscopic Cholecystectomy  Procedure Note  Indications: This patient presents with symptomatic gallbladder disease and will undergo laparoscopic cholecystectomy. The procedure has been discussed with the patient. Operative and non operative treatments have been discussed. Risks of surgery include bleeding, infection,  Common bile duct injury,  Injury to the stomach,liver, colon,small intestine, abdominal wall,  Diaphragm,  Major blood vessels,  And the need for an open procedure.  Other risks include worsening of medical problems, death,  DVT and pulmonary embolism, and cardiovascular events.   Medical options have also been discussed. The patient has been informed of long term expectations of surgery and non surgical options,  The patient agrees to proceed.    Pre-operative Diagnosis: Acute cholecystitis  Post-operative Diagnosis: Same  Surgeon: Turner Daniels  MD   Assistants: Donald Pore PA  Anesthesia: General endotracheal anesthesia and Local anesthesia 0.25.% bupivacaine, with epinephrine  ASA Class: 2  Procedure Details  The patient was seen again in the Holding Room. The risks, benefits, complications, treatment options, and expected outcomes were discussed with the patient. The possibilities of reaction to medication, pulmonary aspiration, perforation of viscus, bleeding, recurrent infection, finding a normal gallbladder, the need for additional procedures, failure to diagnose a condition, the possible need to convert to an open procedure, and creating a complication requiring transfusion or operation were discussed with the patient. The patient and/or family concurred with the proposed plan, giving informed consent. The site of surgery properly noted/marked. The patient was taken to Operating Room, identified as Samuel Pearson and the procedure verified as Laparoscopic Cholecystectomy with Intraoperative Cholangiograms. A Time Out was held and the above information confirmed.  Prior to  the induction of general anesthesia, antibiotic prophylaxis was administered. General endotracheal anesthesia was then administered and tolerated well. After the induction, the abdomen was prepped in the usual sterile fashion. The patient was positioned in the supine position with the left arm comfortably tucked, along with some reverse Trendelenburg.  Local anesthetic agent was injected into the skin near the umbilicus and an incision made. The midline fascia was incised and the Hasson technique was used to introduce a 12 mm port under direct vision. It was secured with a figure of eight Vicryl suture placed in the usual fashion. Pneumoperitoneum was then created with CO2 and tolerated well without any adverse changes in the patient's vital signs. Additional trocars were introduced under direct vision with an 11 mm trocar in the epigastrium and two  5 mm trocars in the right upper quadrant. All skin incisions were infiltrated with a local anesthetic agent before making the incision and placing the trocars.   The gallbladder was identified, the fundus grasped and retracted cephalad. Adhesions were lysed bluntly and with the electrocautery where indicated, taking care not to injure any adjacent organs or viscus. The infundibulum was grasped and retracted laterally, exposing the peritoneum overlying the triangle of Calot. This was then divided and exposed in a blunt fashion. The cystic duct was clearly identified and bluntly dissected circumferentially. The junctions of the gallbladder, cystic duct and common bile duct were clearly identified prior to the division of any linear structure.    The gallbladder was gangrenous and the cysti duct was extremely friable.  The critical view was obtained and cholangiogram not performed due to concern of avulsion.   The cystic duct was then  ligated with surgical clips  on the patient side and  clipped on the gallbladder side and divided. The cystic artery was  identified, dissected free, ligated  with clips and divided as well. Posterior cystic artery clipped and divided.  The gallbladder was dissected from the liver bed in retrograde fashion with the electrocautery. The gallbladder was removed. The liver bed was irrigated and inspected. Hemostasis was achieved with the electrocautery. Copious irrigation was utilized and was repeatedly aspirated until clear all particulate matter. Hemostasis was achieved with no signs of bleeding or bile leakage. 60 F round drained placed and brought out the most lateral port site.   Secured to the skin with 2 -0 nylon.    Pneumoperitoneum was completely reduced after viewing removal of the trocars under direct vision. The wound was thoroughly irrigated and the fascia was then closed with a figure of eight suture; the skin was then closed with 4 0 MONOCRYL  and a sterile dressing was applied.  Instrument, sponge, and needle counts were correct at closure and at the conclusion of the case.   Findings: Cholecystitis with Cholelithiasis  Estimated Blood Loss: less than 50 mL         Drains: 19 F          Total IV Fluids: PER RECORD          Specimens: Gallbladder           Complications: None; patient tolerated the procedure well.         Disposition: PACU - hemodynamically stable.         Condition: stable

## 2020-02-25 NOTE — Anesthesia Preprocedure Evaluation (Signed)
Anesthesia Evaluation  Patient identified by MRN, date of birth, ID band Patient awake    Reviewed: Allergy & Precautions, NPO status , Patient's Chart, lab work & pertinent test results  Airway Mallampati: I  TM Distance: >3 FB Neck ROM: Full    Dental   Pulmonary    Pulmonary exam normal        Cardiovascular hypertension, Pt. on medications Normal cardiovascular exam     Neuro/Psych Anxiety    GI/Hepatic   Endo/Other    Renal/GU      Musculoskeletal   Abdominal   Peds  Hematology   Anesthesia Other Findings   Reproductive/Obstetrics                             Anesthesia Physical Anesthesia Plan  ASA: II  Anesthesia Plan: General   Post-op Pain Management:    Induction: Intravenous  PONV Risk Score and Plan: Ondansetron and Midazolam  Airway Management Planned: Oral ETT  Additional Equipment:   Intra-op Plan:   Post-operative Plan: Extubation in OR  Informed Consent: I have reviewed the patients History and Physical, chart, labs and discussed the procedure including the risks, benefits and alternatives for the proposed anesthesia with the patient or authorized representative who has indicated his/her understanding and acceptance.       Plan Discussed with: CRNA and Surgeon  Anesthesia Plan Comments:         Anesthesia Quick Evaluation

## 2020-02-25 NOTE — Discharge Instructions (Signed)
[facility] LAPAROSCOPIC SURGERY: POST OP INSTRUCTIONS Always review your discharge instruction sheet given to you by the facility where your surgery was performed. IF YOU HAVE DISABILITY OR FAMILY LEAVE FORMS, YOU MUST BRING THEM TO THE OFFICE FOR PROCESSING.   DO NOT GIVE THEM TO YOUR DOCTOR.  PAIN CONTROL  1. First take acetaminophen (Tylenol) AND/or ibuprofen (Advil) to control your pain after surgery.  Follow directions on package.  Taking acetaminophen (Tylenol) and/or ibuprofen (Advil) regularly after surgery will help to control your pain and lower the amount of prescription pain medication you may need.  You should not take more than 3,000 mg (3 grams) of acetaminophen (Tylenol) in 24 hours.  You should not take ibuprofen (Advil), aleve, motrin, naprosyn or other NSAIDS if you have a history of stomach ulcers or chronic kidney disease.  2. A prescription for pain medication may be given to you upon discharge.  Take your pain medication as prescribed, if you still have uncontrolled pain after taking acetaminophen (Tylenol) or ibuprofen (Advil). 3. Use ice packs to help control pain. 4. If you need a refill on your pain medication, please contact your pharmacy.  They will contact our office to request authorization. Prescriptions will not be filled after 5pm or on week-ends.  HOME MEDICATIONS 5. Take your usually prescribed medications unless otherwise directed.  DIET 6. You should follow a light diet the first few days after arrival home.  Be sure to include lots of fluids daily. Avoid fatty, fried foods.   CONSTIPATION 7. It is common to experience some constipation after surgery and if you are taking pain medication.  Increasing fluid intake and taking a stool softener (such as Colace) will usually help or prevent this problem from occurring.  A mild laxative (Milk of Magnesia or Miralax) should be taken according to package instructions if there are no bowel  movements after 48 hours.  WOUND/INCISION CARE 8. Most patients will experience some swelling and bruising in the area of the incisions.  Ice packs will help.  Swelling and bruising can take several days to resolve.  9. Unless discharge instructions indicate otherwise, follow guidelines below  a. STERI-STRIPS - you may remove your outer bandages 48 hours after surgery, and you may shower at that time.  You have steri-strips (small skin tapes) in place directly over the incision.  These strips should be left on the skin for 7-10 days.   b. DERMABOND/SKIN GLUE - you may shower in 24 hours.  The glue will flake off over the next 2-3 weeks. 10. Any sutures or staples will be removed at the office during your follow-up visit.  ACTIVITIES 11. You may resume regular (light) daily activities beginning the next day--such as daily self-care, walking, climbing stairs--gradually increasing activities as tolerated.  You may have sexual intercourse when it is comfortable.  Refrain from any heavy lifting or straining until approved by your doctor. a. You may drive when you are no longer taking prescription pain medication, you can comfortably wear a seatbelt, and you can safely maneuver your car and apply brakes.  FOLLOW-UP 12. You should see your doctor in the office for a follow-up appointment approximately 2-3 weeks after your surgery.  You should have been given your post-op/follow-up appointment when your surgery was scheduled.  If you did not receive a post-op/follow-up appointment, make sure that you call for this appointment within a day or two after you arrive home to insure a convenient appointment time.     WHEN TO CALL YOUR DOCTOR: 1. Fever over 101.0 2. Inability to urinate 3. Continued bleeding from incision. 4. Increased pain, redness, or drainage from the incision. 5. Increasing abdominal pain  The clinic staff is available to answer your questions during regular business hours.  Please don't  hesitate to call and ask to speak to one of the nurses for clinical concerns.  If you have a medical emergency, go to the nearest emergency room or call 911.  A surgeon from [facility] is always on call at the hospital. [address], [address], [place], [place]  [address] ? [address] [address], [place], [place]   [address] [phone] ? [phone] ? FAX [phone] Web site: [email or web address]  .........   Managing Your Pain After Surgery Without Opioids    Thank you for participating in our program to help patients manage their pain after surgery without opioids. This is part of our effort to provide you with the best care possible, without exposing you or your family to the risk that opioids pose.  What pain can I expect after surgery? You can expect to have some pain after surgery. This is normal. The pain is typically worse the day after surgery, and quickly begins to get better. Many studies have found that many patients are able to manage their pain after surgery with Over-the-Counter (OTC) medications such as Tylenol and Motrin. If you have a condition that does not allow you to take Tylenol or Motrin, notify your surgical team.  How will I manage my pain? The best strategy for controlling your pain after surgery is around the clock pain control with Tylenol (acetaminophen) and Motrin (ibuprofen or Advil). Alternating these medications with each other allows you to maximize your pain control. In addition to Tylenol and Motrin, you can use heating pads or ice packs on your incisions to help reduce your pain.  How will I alternate your regular strength over-the-counter pain medication? You will take a dose of pain medication every three hours. ; Start by taking 650 mg of Tylenol (2 pills of 325 mg) ; 3 hours later take 600 mg of Motrin (3 pills of 200 mg) ; 3 hours after taking the Motrin take 650 mg of Tylenol ; 3 hours after that take 600 mg of  Motrin.   - 1 -  See example - if your first dose of Tylenol is at 12:00 PM   12:00 PM Tylenol 650 mg (2 pills of 325 mg)  3:00 PM Motrin 600 mg (3 pills of 200 mg)  6:00 PM Tylenol 650 mg (2 pills of 325 mg)  9:00 PM Motrin 600 mg (3 pills of 200 mg)  Continue alternating every 3 hours   We recommend that you follow this schedule around-the-clock for at least 3 days after surgery, or until you feel that it is no longer needed. Use the table on the last page of this handout to keep track of the medications you are taking. Important: Do not take more than 3000mg of Tylenol or 3200mg of Motrin in a 24-hour period. Do not take ibuprofen/Motrin if you have a history of bleeding stomach ulcers, severe kidney disease, &/or actively taking a blood thinner  What if I still have pain? If you have pain that is not controlled with the over-the-counter pain medications (Tylenol and Motrin or Advil) you might have what we call "breakthrough" pain. You will receive a prescription for a small amount of an opioid pain medication such as   Oxycodone, Tramadol, or Tylenol with Codeine. Use these opioid pills in the first 24 hours after surgery if you have breakthrough pain. Do not take more than 1 pill every 4-6 hours.  If you still have uncontrolled pain after using all opioid pills, don't hesitate to call our staff using the number provided. We will help make sure you are managing your pain in the best way possible, and if necessary, we can provide a prescription for additional pain medication.   Day 1    Time  Name of Medication Number of pills taken  Amount of Acetaminophen  Pain Level   Comments  AM PM       AM PM       AM PM       AM PM       AM PM       AM PM       AM PM       AM PM       Total Daily amount of Acetaminophen Do not take more than  3,000 mg per day      Day 2    Time  Name of Medication Number of pills taken  Amount of Acetaminophen  Pain Level   Comments  AM  PM       AM PM       AM PM       AM PM       AM PM       AM PM       AM PM       AM PM       Total Daily amount of Acetaminophen Do not take more than  3,000 mg per day      Day 3    Time  Name of Medication Number of pills taken  Amount of Acetaminophen  Pain Level   Comments  AM PM       AM PM       AM PM       AM PM          AM PM       AM PM       AM PM       AM PM       Total Daily amount of Acetaminophen Do not take more than  3,000 mg per day      Day 4    Time  Name of Medication Number of pills taken  Amount of Acetaminophen  Pain Level   Comments  AM PM       AM PM       AM PM       AM PM       AM PM       AM PM       AM PM       AM PM       Total Daily amount of Acetaminophen Do not take more than  3,000 mg per day      Day 5    Time  Name of Medication Number of pills taken  Amount of Acetaminophen  Pain Level   Comments  AM PM       AM PM       AM PM       AM PM       AM PM       AM PM       AM PM         AM PM       Total Daily amount of Acetaminophen Do not take more than  3,000 mg per day       Day 6    Time  Name of Medication Number of pills taken  Amount of Acetaminophen  Pain Level  Comments  AM PM       AM PM       AM PM       AM PM       AM PM       AM PM       AM PM       AM PM       Total Daily amount of Acetaminophen Do not take more than  3,000 mg per day      Day 7    Time  Name of Medication Number of pills taken  Amount of Acetaminophen  Pain Level   Comments  AM PM       AM PM       AM PM       AM PM       AM PM       AM PM       AM PM       AM PM       Total Daily amount of Acetaminophen Do not take more than  3,000 mg per day        For additional information about how and where to safely dispose of unused opioid medications - https://www.[employer]ful[employer].org  Disclaimer: This document contains information and/or instructional materials adapted from [facility]  for the typical patient with your condition. It does not replace medical advice from your health care provider because your experience may differ from that of the typical patient. Talk to your health care provider if you have any questions about this document, your condition or your treatment plan. Adapted from [facility]  

## 2020-02-25 NOTE — Anesthesia Procedure Notes (Signed)
Procedure Name: Intubation Performed by: Milford Cage, CRNA Pre-anesthesia Checklist: Patient identified, Emergency Drugs available, Suction available and Patient being monitored Patient Re-evaluated:Patient Re-evaluated prior to induction Oxygen Delivery Method: Circle System Utilized Preoxygenation: Pre-oxygenation with 100% oxygen Induction Type: IV induction, Rapid sequence and Cricoid Pressure applied Laryngoscope Size: Miller and 2 Grade View: Grade II Tube type: Oral Tube size: 7.0 mm Number of attempts: 1 Airway Equipment and Method: Stylet and Oral airway Placement Confirmation: ETT inserted through vocal cords under direct vision,  positive ETCO2 and breath sounds checked- equal and bilateral Secured at: 21 cm Tube secured with: Tape Dental Injury: Teeth and Oropharynx as per pre-operative assessment

## 2020-02-26 ENCOUNTER — Encounter (HOSPITAL_COMMUNITY): Payer: Self-pay | Admitting: Surgery

## 2020-02-26 ENCOUNTER — Other Ambulatory Visit (HOSPITAL_COMMUNITY): Payer: Self-pay | Admitting: Internal Medicine

## 2020-02-26 LAB — SURGICAL PATHOLOGY

## 2020-02-26 MED ORDER — THIAMINE HCL 100 MG/ML IJ SOLN: 100.0000 mg | Freq: Every day | INTRAMUSCULAR | Status: DC

## 2020-02-26 MED ORDER — THIAMINE HCL 100 MG PO TABS
100.0000 mg | ORAL_TABLET | Freq: Every day | ORAL | Status: DC
  Administered 2020-02-26: 100 mg via ORAL
  Filled 2020-02-26: qty 1

## 2020-02-26 MED ORDER — AMOXICILLIN-POT CLAVULANATE 875-125 MG PO TABS: 1.0000 | ORAL_TABLET | Freq: Two times a day (BID) | ORAL | 0 refills | Status: DC

## 2020-02-26 MED ORDER — AMOXICILLIN-POT CLAVULANATE 875-125 MG PO TABS
1.0000 | ORAL_TABLET | Freq: Two times a day (BID) | ORAL | Status: DC
  Administered 2020-02-26: 1 via ORAL
  Filled 2020-02-26 (×2): qty 1

## 2020-02-26 MED FILL — AMOX-CLAV 875-125 MG TABLET: 875-125 | 7 days supply | Qty: 14 | Fill #0

## 2020-02-26 NOTE — Discharge Summary (Signed)
Physician Discharge Summary  Samuel Pearson MEQ:[id number] DOB: 02/03/1946 DOA: 02/24/2020  PCP: Burnis Medin, MD  Admit date: 02/24/2020 Discharge date: 02/26/2020  Admitted From: home Discharge disposition: home   Recommendations for Outpatient Follow-Up:   1. Echo shows AS- defer cardiology referral to PCP 2. Drain to stay in until general surgery follow up 3. abx given for 1 week 4. Cbc, bmp 1 week   Discharge Diagnosis:   Principal Problem:   Acute cholecystitis Active Problems:   Heart murmur   Essential hypertension    Discharge Condition: Improved.  Diet recommendation: soft  Wound care: None.  Code status: Full.   History of Present Illness:   Samuel Pearson is a 74 y.o. male with medical history significant for HTN, allergic rhinitis, heart murmur who presents for evaluation of right upper abdominal pain that began this am.  The pain started around 9 AM this morning.  Is associated with nausea but has not had any vomiting or diarrhea.  He reports decreased appetite.  Reports pain has been coming in waves that have progressively gotten worse throughout the day.  States the pain has not radiated to his lower abdomen or his back.  States he has never had pain like this in the past.  He denies any chest pain or palpitations.  He has not had any cough or shortness of breath.  Reports having chills but has not had a fever.  He reports laying on his right side causes increased discomfort and pain.  He has not noted any relieving factors he is not taking any medications for the pain at home.  He denies any urinary frequency, hesitancy or dysuria.  He has not had any recent travel or prolonged immobilization.  He has no history of DVTs or PE.   Hospital Course by Problem:   Acute cholecystitis S/p LAPAROSCOPIC CHOLECYSTECTOMY (N/A) OK for discharge per GS augmentin x 7 days Will keep the drain until Follow up Regular diet   Essential  hypertension Continue home dose of metoprolol and lisinopril.   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  -echo shows AS-- defer to PCP for outpatient cards follow up      Medical Consultants:   [facility] [facility]   Discharge Exam:   Vitals:   02/26/20 0110 02/26/20 0521  BP: 135/64 (!) 133/58  Pulse: 70 70  Resp: 18 18  Temp: 98 F (36.7 C) 98.4 F (36.9 C)  SpO2: 94% 94%   Vitals:   02/25/20 1347 02/25/20 1758 02/26/20 0110 02/26/20 0521  BP: (!) 96/53 136/66 135/64 (!) 133/58  Pulse: 72 66 70 70  Resp: 18 18 18 18   Temp:  98.4 F (36.9 C) 98 F (36.7 C) 98.4 F (36.9 C)  TempSrc:  Oral Oral Oral  SpO2: 93% 95% 94% 94%  Weight:      Height:        General exam: Appears calm and comfortable. Up walking in room  The results of significant diagnostics from this hospitalization (including imaging, microbiology, ancillary and laboratory) are listed below for reference.     Procedures and Diagnostic Studies:   CT ABDOMEN PELVIS W CONTRAST  Result Date: 02/25/2020 CLINICAL DATA:  Right lower quadrant abdominal pain, anorexia, and nausea EXAM: CT ABDOMEN AND PELVIS WITH CONTRAST TECHNIQUE: Multidetector CT imaging of the abdomen and pelvis was performed using the standard protocol following bolus administration of intravenous contrast. CONTRAST:  120mL OMNIPAQUE IOHEXOL 300 MG/ML  SOLN COMPARISON:  None. FINDINGS: Lower chest: Patchy infiltration or atelectasis in the lung bases. Mild cardiac enlargement. Small esophageal hiatal hernia. Hepatobiliary: Distended gallbladder with mild gallbladder wall thickening and small stone. Mild pericholecystic infiltration likely representing acute cholecystitis. No bile duct dilatation. Mild diffuse fatty infiltration of the liver. Small cysts. Pancreas: Unremarkable. No pancreatic ductal dilatation or surrounding inflammatory changes. Spleen: Normal in size without focal abnormality.  Adrenals/Urinary Tract: No adrenal gland nodules. Bilateral parapelvic renal cysts. Nephrograms are homogeneous and symmetrical. No hydronephrosis. Bladder is normal. Stomach/Bowel: Stomach, small bowel, and colon are not abnormally distended. Scattered stool throughout the colon. Left inguinal hernia containing a portion of the sigmoid colon. No proximal obstruction. Appendix is normal. Vascular/Lymphatic: Aortic atherosclerosis. No enlarged abdominal or pelvic lymph nodes. Reproductive: Prostate gland is diffusely enlarged. Other: No free air or free fluid in the abdomen. Fat containing hernias in the periumbilical and right groin region. Left inguinal hernia contains colon as described above. Musculoskeletal: Degenerative changes in the spine. No destructive bone lesions. Lumbar scoliosis. IMPRESSION: 1. Distended gallbladder with small stone and mild gallbladder wall thickening and pericholecystic infiltration likely representing acute cholecystitis. 2. Mild diffuse fatty infiltration of the liver. 3. Bilateral parapelvic renal cysts. 4. Left inguinal hernia containing a portion of the sigmoid colon. No proximal obstruction. 5. Fat containing hernias in the periumbilical and right groin region. 6. Prostate gland is diffusely enlarged. 7. Patchy infiltration or atelectasis in the lung bases. 8. Small esophageal hiatal hernia. 9. Aortic atherosclerosis. Aortic Atherosclerosis (ICD10-I70.0). Electronically Signed   By: Lucienne Capers M.D.   On: 02/25/2020 02:39   ECHOCARDIOGRAM COMPLETE  Result Date: 02/25/2020    ECHOCARDIOGRAM REPORT   Patient Name:   Samuel Pearson Date of Exam: 02/25/2020 Medical Rec #:  [id number]        Height:       61.7 in Accession #:    [id number]       Weight:       155.0 lb Date of Birth:  1945/10/24        BSA:          1.710 m Patient Age:    48 years         BP:           96/53 mmHg Patient Gender: M                HR:           72 bpm. Exam Location:  Inpatient Procedure: 2D  Echo, Cardiac Doppler and Color Doppler Indications:    Murmur  History:        Patient has no prior history of Echocardiogram examinations.                 Signs/Symptoms:Murmur; Risk Factors:Hypertension. Post-op                 cholesyscitis.  Sonographer:    Dustin Flock Referring Phys: [place]  1. The aortic valve is tricuspid. Aortic valve regurgitation is moderate to severe.  2. Left ventricular ejection fraction, by estimation, is 55 to 60%. The left ventricle has normal function. The left ventricle has no regional wall motion abnormalities. Left ventricular diastolic parameters are consistent with Grade I diastolic dysfunction (impaired relaxation).  3. Right ventricular systolic function is normal. The right ventricular size is normal.  4. The mitral valve is grossly normal. No evidence of mitral valve regurgitation.  Comparison(s): No prior Echocardiogram. Conclusion(s)/Recommendation(s): Moderate to severe aortic insufficiency without LV or aortic root dilation. Consider TEE or secondary modality for further quantification if clinically indicated. FINDINGS  Left Ventricle: Left ventricular ejection fraction, by estimation, is 55 to 60%. The left ventricle has normal function. The left ventricle has no regional wall motion abnormalities. The left ventricular internal cavity size was normal in size. There is  no left ventricular hypertrophy. Left ventricular diastolic parameters are consistent with Grade I diastolic dysfunction (impaired relaxation). Right Ventricle: The right ventricular size is normal. No increase in right ventricular wall thickness. Right ventricular systolic function is normal. Left Atrium: Left atrial size was normal in size. Right Atrium: Right atrial size was normal in size. Pericardium: There is no evidence of pericardial effusion. Mitral Valve: The mitral valve is grossly normal. No evidence of mitral valve regurgitation. Tricuspid Valve: The tricuspid  valve is grossly normal. Tricuspid valve regurgitation is trivial. Aortic Valve: The aortic valve is tricuspid. Aortic valve regurgitation is moderate to severe. Pulmonic Valve: The pulmonic valve was grossly normal. Pulmonic valve regurgitation is not visualized. Aorta: The aortic root is normal in size and structure and the ascending aorta was not well visualized. Venous: The pulmonary veins were not well visualized. IAS/Shunts: The atrial septum is grossly normal.  LEFT VENTRICLE PLAX 2D LVIDd:         4.40 cm  Diastology LVIDs:         3.30 cm  LV e' medial:    6.42 cm/s LV PW:         1.30 cm  LV E/e' medial:  11.5 LV IVS:        1.30 cm  LV e' lateral:   5.87 cm/s LVOT diam:     2.60 cm  LV E/e' lateral: 12.6 LV SV:         149 LV SV Index:   87 LVOT Area:     5.31 cm  RIGHT VENTRICLE RV Basal diam:  2.40 cm RV S prime:     6.09 cm/s TAPSE (M-mode): 2.6 cm LEFT ATRIUM             Index       RIGHT ATRIUM           Index LA diam:        4.20 cm 2.46 cm/m  RA Area:     14.00 cm LA Vol (A2C):   41.0 ml 23.98 ml/m RA Volume:   32.00 ml  18.71 ml/m LA Vol (A4C):   36.4 ml 21.29 ml/m LA Biplane Vol: 41.9 ml 24.50 ml/m  AORTIC VALVE LVOT Vmax:   130.00 cm/s LVOT Vmean:  80.600 cm/s LVOT VTI:    0.280 m  AORTA Ao Root diam: 3.90 cm MITRAL VALVE MV Area (PHT): 2.83 cm     SHUNTS MV Decel Time: 268 msec     Systemic VTI:  0.28 m MV E velocity: 74.10 cm/s   Systemic Diam: 2.60 cm MV A velocity: 110.00 cm/s MV E/A ratio:  0.67 Rudean Haskell MD Electronically signed by Rudean Haskell MD Signature Date/Time: 02/25/2020/6:26:08 PM    Final      Labs:   Basic Metabolic Panel: Recent Labs  Lab 02/24/20 2205 02/25/20 0549  NA 138 138  K 4.0 4.3  CL 102 102  CO2 21* 20*  GLUCOSE 145* 146*  BUN 16 14  CREATININE 0.88 0.75  CALCIUM 9.6 9.0   GFR Estimated Creatinine Clearance: 68.3 mL/min (by C-G  formula based on SCr of 0.75 mg/dL). Liver Function Tests: Recent Labs  Lab 02/24/20 2205  02/25/20 0549  AST 31 50*  ALT 28 37  ALKPHOS 59 55  BILITOT 1.2 1.1  PROT 7.3 6.7  ALBUMIN 4.2 3.7   Recent Labs  Lab 02/24/20 2205  LIPASE 27   No results for input(s): AMMONIA in the last 168 hours. Coagulation profile No results for input(s): INR, PROTIME in the last 168 hours.  CBC: Recent Labs  Lab 02/21/20 1021 02/24/20 2205 02/25/20 0549  WBC 6.3 16.9* 18.8*  NEUTROABS 3,774  --   --   HGB 14.0 14.7 14.2  HCT 41.7 45.0 44.0  MCV 96.8 97.6 98.4  PLT 228 239 224   Cardiac Enzymes: No results for input(s): CKTOTAL, CKMB, CKMBINDEX, TROPONINI in the last 168 hours. BNP: Invalid input(s): POCBNP CBG: No results for input(s): GLUCAP in the last 168 hours. D-Dimer No results for input(s): DDIMER in the last 72 hours. Hgb A1c No results for input(s): HGBA1C in the last 72 hours. Lipid Profile No results for input(s): CHOL, HDL, LDLCALC, TRIG, CHOLHDL, LDLDIRECT in the last 72 hours. Thyroid function studies No results for input(s): TSH, T4TOTAL, T3FREE, THYROIDAB in the last 72 hours.  Invalid input(s): FREET3 Anemia work up No results for input(s): VITAMINB12, FOLATE, FERRITIN, TIBC, IRON, RETICCTPCT in the last 72 hours. Microbiology Recent Results (from the past 240 hour(s))  Respiratory Panel by RT PCR (Flu A&B, Covid) - Nasopharyngeal Swab     Status: None   Collection Time: 02/25/20  3:48 AM   Specimen: Nasopharyngeal Swab  Result Value Ref Range Status   SARS Coronavirus 2 by RT PCR NEGATIVE NEGATIVE Final    Comment: (NOTE) SARS-CoV-2 target nucleic acids are NOT DETECTED.  The SARS-CoV-2 RNA is generally detectable in upper respiratoy specimens during the acute phase of infection. The lowest concentration of SARS-CoV-2 viral copies this assay can detect is 131 copies/mL. A negative result does not preclude SARS-Cov-2 infection and should not be used as the sole basis for treatment or other patient management decisions. A negative result may  occur with  improper specimen collection/handling, submission of specimen other than nasopharyngeal swab, presence of viral mutation(s) within the areas targeted by this assay, and inadequate number of viral copies (<131 copies/mL). A negative result must be combined with clinical observations, patient history, and epidemiological information. The expected result is Negative.  Fact Sheet for Patients:  PinkCheek.be  Fact Sheet for Healthcare Providers:  GravelBags.it  This test is no t yet approved or cleared by the [place] FDA and  has been authorized for detection and/or diagnosis of SARS-CoV-2 by FDA under an Emergency Use Authorization (EUA). This EUA will remain  in effect (meaning this test can be used) for the duration of the COVID-19 declaration under Section 564(b)(1) of the Act, 21 U.S.C. section 360bbb-3(b)(1), unless the authorization is terminated or revoked sooner.     Influenza A by PCR NEGATIVE NEGATIVE Final   Influenza B by PCR NEGATIVE NEGATIVE Final    Comment: (NOTE) The Xpert Xpress SARS-CoV-2/FLU/RSV assay is intended as an aid in  the diagnosis of influenza from Nasopharyngeal swab specimens and  should not be used as a sole basis for treatment. Nasal washings and  aspirates are unacceptable for Xpert Xpress SARS-CoV-2/FLU/RSV  testing.  Fact Sheet for Patients: PinkCheek.be  Fact Sheet for Healthcare Providers: GravelBags.it  This test is not yet approved or cleared by the [place] and  has been authorized for detection and/or diagnosis of SARS-CoV-2 by  FDA under an Emergency Use Authorization (EUA). This EUA will remain  in effect (meaning this test can be used) for the duration of the  Covid-19 declaration under Section 564(b)(1) of the Act, 21  U.S.C. section 360bbb-3(b)(1), unless the authorization is  terminated or  revoked. Performed at [facility], [place] [address], [place], [place] [address]      Discharge Instructions:   Discharge Instructions    Diet - low sodium heart healthy   Complete by: As directed    Discharge wound care:   Complete by: As directed    See instructions from general surgery   Increase activity slowly   Complete by: As directed      Allergies as of 02/26/2020      Reactions   Pollen Extract-tree Extract [pollen Extract]       Medication List    STOP taking these medications   aspirin 81 MG EC tablet     TAKE these medications   [place] 0.025 % ophthalmic solution Generic drug: ketotifen Apply 1 drop to eye 2 (two) times daily.   amoxicillin-clavulanate 875-125 MG tablet Commonly known as: AUGMENTIN Take 1 tablet by mouth every 12 (twelve) hours.   chlorpheniramine 4 MG tablet Commonly known as: CHLOR-TRIMETON Take 4 mg by mouth 2 (two) times daily as needed for allergies.   diphenhydrAMINE 25 MG tablet Commonly known as: BENADRYL Take 25 mg by mouth every 6 (six) hours as needed.   lisinopril 20 MG tablet Commonly known as: ZESTRIL TAKE 1 TABLET BY MOUTH EVERY DAY   metoprolol tartrate 25 MG tablet Commonly known as: LOPRESSOR TAKE 1 TABLET BY MOUTH TWICE A DAY   niacin 500 MG tablet Take by mouth.   Vitamin B-12 1000 MCG Subl Place 2 tablets (2,000 mcg total) under the tongue daily.            Discharge Care Instructions  (From admission, onward)         Start     Ordered   02/26/20 0000  Discharge wound care:       Comments: See instructions from general surgery   02/26/20 1053          Follow-up Information    Surgery, [place]. Go on 03/06/2020.   Specialty: General Surgery Why: Nurse visit for drain removal scheduled for 2 PM. Please arrive 30 min prior to appointment time. Bring photo ID and insurance information.  Contact information: [address] [place] [address] [phone]         Carlena Hurl, PA-C. Go on 03/17/2020.   Specialty: General Surgery Why: Follow up scheduled for 8:30 AM. Please arrive 15 min prior to appointment time. Bring photo ID and insurance information.  Contact information: [address] [place] [address] [phone]        Burnis Medin, MD Follow up in 1 week(s).   Specialties: Internal Medicine, Pediatrics Contact information: [place]  [address] [phone]                Time coordinating discharge: 35 min  Signed:  Geradine Girt DO  [facility] 02/26/2020, 10:53 AM

## 2020-02-26 NOTE — Progress Notes (Signed)
RN gave discharge instructions, IV has been removed, patient has one med from [place], patient stated understanding of discharge instructions.

## 2020-02-26 NOTE — Progress Notes (Signed)
1 Day Post-Op   Subjective/Chief Complaint: Not happy about IV pump Denies significant abdominal pain    Objective: Vital signs in last 24 hours: Temp:  [97.1 F (36.2 C)-98.4 F (36.9 C)] 98.4 F (36.9 C) (10/20 0521) Pulse Rate:  [64-81] 70 (10/20 0521) Resp:  [10-18] 18 (10/20 0521) BP: (96-136)/(45-66) 133/58 (10/20 0521) SpO2:  [93 %-97 %] 94 % (10/20 0521)    Intake/Output from previous day: 10/19 0701 - 10/20 0700 In: 2650.7 [P.O.:360; I.V.:2090.7; IV Piggyback:200] Out: 95 [Drains:85; Blood:10] Intake/Output this shift: No intake/output data recorded.  Incision/Wound:CDI soft but sore JP serous   Lab Results:  Recent Labs    02/24/20 2205 02/25/20 0549  WBC 16.9* 18.8*  HGB 14.7 14.2  HCT 45.0 44.0  PLT 239 224   BMET Recent Labs    02/24/20 2205 02/25/20 0549  NA 138 138  K 4.0 4.3  CL 102 102  CO2 21* 20*  GLUCOSE 145* 146*  BUN 16 14  CREATININE 0.88 0.75  CALCIUM 9.6 9.0   PT/INR No results for input(s): LABPROT, INR in the last 72 hours. ABG No results for input(s): PHART, HCO3 in the last 72 hours.  Invalid input(s): PCO2, PO2  Studies/Results: CT ABDOMEN PELVIS W CONTRAST  Result Date: 02/25/2020 CLINICAL DATA:  Right lower quadrant abdominal pain, anorexia, and nausea EXAM: CT ABDOMEN AND PELVIS WITH CONTRAST TECHNIQUE: Multidetector CT imaging of the abdomen and pelvis was performed using the standard protocol following bolus administration of intravenous contrast. CONTRAST:  143mL OMNIPAQUE IOHEXOL 300 MG/ML  SOLN COMPARISON:  None. FINDINGS: Lower chest: Patchy infiltration or atelectasis in the lung bases. Mild cardiac enlargement. Small esophageal hiatal hernia. Hepatobiliary: Distended gallbladder with mild gallbladder wall thickening and small stone. Mild pericholecystic infiltration likely representing acute cholecystitis. No bile duct dilatation. Mild diffuse fatty infiltration of the liver. Small cysts. Pancreas: Unremarkable.  No pancreatic ductal dilatation or surrounding inflammatory changes. Spleen: Normal in size without focal abnormality. Adrenals/Urinary Tract: No adrenal gland nodules. Bilateral parapelvic renal cysts. Nephrograms are homogeneous and symmetrical. No hydronephrosis. Bladder is normal. Stomach/Bowel: Stomach, small bowel, and colon are not abnormally distended. Scattered stool throughout the colon. Left inguinal hernia containing a portion of the sigmoid colon. No proximal obstruction. Appendix is normal. Vascular/Lymphatic: Aortic atherosclerosis. No enlarged abdominal or pelvic lymph nodes. Reproductive: Prostate gland is diffusely enlarged. Other: No free air or free fluid in the abdomen. Fat containing hernias in the periumbilical and right groin region. Left inguinal hernia contains colon as described above. Musculoskeletal: Degenerative changes in the spine. No destructive bone lesions. Lumbar scoliosis. IMPRESSION: 1. Distended gallbladder with small stone and mild gallbladder wall thickening and pericholecystic infiltration likely representing acute cholecystitis. 2. Mild diffuse fatty infiltration of the liver. 3. Bilateral parapelvic renal cysts. 4. Left inguinal hernia containing a portion of the sigmoid colon. No proximal obstruction. 5. Fat containing hernias in the periumbilical and right groin region. 6. Prostate gland is diffusely enlarged. 7. Patchy infiltration or atelectasis in the lung bases. 8. Small esophageal hiatal hernia. 9. Aortic atherosclerosis. Aortic Atherosclerosis (ICD10-I70.0). Electronically Signed   By: Lucienne Capers M.D.   On: 02/25/2020 02:39   ECHOCARDIOGRAM COMPLETE  Result Date: 02/25/2020    ECHOCARDIOGRAM REPORT   Patient Name:   Samuel Pearson Date of Exam: 02/25/2020 Medical Rec #:  [id number]        Height:       61.7 in Accession #:    [id number]  Weight:       155.0 lb Date of Birth:  [birth year]        BSA:          1.710 m Patient Age:    74 years          BP:           96/53 mmHg Patient Gender: M                HR:           72 bpm. Exam Location:  Inpatient Procedure: 2D Echo, Cardiac Doppler and Color Doppler Indications:    Murmur  History:        Patient has no prior history of Echocardiogram examinations.                 Signs/Symptoms:Murmur; Risk Factors:Hypertension. Post-op                 cholesyscitis.  Sonographer:    Dustin Flock Referring Phys: [place]  1. The aortic valve is tricuspid. Aortic valve regurgitation is moderate to severe.  2. Left ventricular ejection fraction, by estimation, is 55 to 60%. The left ventricle has normal function. The left ventricle has no regional wall motion abnormalities. Left ventricular diastolic parameters are consistent with Grade I diastolic dysfunction (impaired relaxation).  3. Right ventricular systolic function is normal. The right ventricular size is normal.  4. The mitral valve is grossly normal. No evidence of mitral valve regurgitation. Comparison(s): No prior Echocardiogram. Conclusion(s)/Recommendation(s): Moderate to severe aortic insufficiency without LV or aortic root dilation. Consider TEE or secondary modality for further quantification if clinically indicated. FINDINGS  Left Ventricle: Left ventricular ejection fraction, by estimation, is 55 to 60%. The left ventricle has normal function. The left ventricle has no regional wall motion abnormalities. The left ventricular internal cavity size was normal in size. There is  no left ventricular hypertrophy. Left ventricular diastolic parameters are consistent with Grade I diastolic dysfunction (impaired relaxation). Right Ventricle: The right ventricular size is normal. No increase in right ventricular wall thickness. Right ventricular systolic function is normal. Left Atrium: Left atrial size was normal in size. Right Atrium: Right atrial size was normal in size. Pericardium: There is no evidence of pericardial effusion.  Mitral Valve: The mitral valve is grossly normal. No evidence of mitral valve regurgitation. Tricuspid Valve: The tricuspid valve is grossly normal. Tricuspid valve regurgitation is trivial. Aortic Valve: The aortic valve is tricuspid. Aortic valve regurgitation is moderate to severe. Pulmonic Valve: The pulmonic valve was grossly normal. Pulmonic valve regurgitation is not visualized. Aorta: The aortic root is normal in size and structure and the ascending aorta was not well visualized. Venous: The pulmonary veins were not well visualized. IAS/Shunts: The atrial septum is grossly normal.  LEFT VENTRICLE PLAX 2D LVIDd:         4.40 cm  Diastology LVIDs:         3.30 cm  LV e' medial:    6.42 cm/s LV PW:         1.30 cm  LV E/e' medial:  11.5 LV IVS:        1.30 cm  LV e' lateral:   5.87 cm/s LVOT diam:     2.60 cm  LV E/e' lateral: 12.6 LV SV:         149 LV SV Index:   87 LVOT Area:     5.31 cm  RIGHT VENTRICLE RV Basal  diam:  2.40 cm RV S prime:     6.09 cm/s TAPSE (M-mode): 2.6 cm LEFT ATRIUM             Index       RIGHT ATRIUM           Index LA diam:        4.20 cm 2.46 cm/m  RA Area:     14.00 cm LA Vol (A2C):   41.0 ml 23.98 ml/m RA Volume:   32.00 ml  18.71 ml/m LA Vol (A4C):   36.4 ml 21.29 ml/m LA Biplane Vol: 41.9 ml 24.50 ml/m  AORTIC VALVE LVOT Vmax:   130.00 cm/s LVOT Vmean:  80.600 cm/s LVOT VTI:    0.280 m  AORTA Ao Root diam: 3.90 cm MITRAL VALVE MV Area (PHT): 2.83 cm     SHUNTS MV Decel Time: 268 msec     Systemic VTI:  0.28 m MV E velocity: 74.10 cm/s   Systemic Diam: 2.60 cm MV A velocity: 110.00 cm/s MV E/A ratio:  0.67 Rudean Haskell MD Electronically signed by Rudean Haskell MD Signature Date/Time: 02/25/2020/6:26:08 PM    Final     Anti-infectives: Anti-infectives (From admission, onward)   Start     Dose/Rate Route Frequency Ordered Stop   02/26/20 0430  cefTRIAXone (ROCEPHIN) 2 g in sodium chloride 0.9 % 100 mL IVPB        2 g 200 mL/hr over 30 Minutes  Intravenous Every 24 hours 02/25/20 1339     02/25/20 0430  cefTRIAXone (ROCEPHIN) 1 g in sodium chloride 0.9 % 100 mL IVPB  Status:  Discontinued        1 g 200 mL/hr over 30 Minutes Intravenous Every 24 hours 02/25/20 0429 02/25/20 1339   02/25/20 0430  metroNIDAZOLE (FLAGYL) IVPB 500 mg        500 mg 100 mL/hr over 60 Minutes Intravenous Every 8 hours 02/25/20 0429     02/25/20 0315  cefTRIAXone (ROCEPHIN) 2 g in sodium chloride 0.9 % 100 mL IVPB        2 g 200 mL/hr over 30 Minutes Intravenous  Once 02/25/20 0313 02/25/20 0424   02/25/20 0315  metroNIDAZOLE (FLAGYL) IVPB 500 mg        500 mg 100 mL/hr over 60 Minutes Intravenous  Once 02/25/20 0313 02/25/20 0606      Assessment/Plan: s/p Procedure(s): LAPAROSCOPIC CHOLECYSTECTOMY (N/A) OK for discharge  Will keep the drain Follow up in chart Regular diet  Ok to shower  Instructions given   LOS: 1 day    Turner Daniels MD  02/26/2020

## 2020-02-27 ENCOUNTER — Telehealth: Payer: Self-pay | Admitting: Internal Medicine

## 2020-02-27 NOTE — Telephone Encounter (Signed)
Transition Care Management Follow-up Telephone Call  Date of discharge and from where: 02/26/2020 from [facility]   How have you been since you were released from the hospital? States he is doing better, has started eating solid foods at home. States everything is going well.  Any questions or concerns? No  Items Reviewed:  Did the pt receive and understand the discharge instructions provided? Yes   Medications obtained and verified? Yes   Other? No   Any new allergies since your discharge? No   Dietary orders reviewed? Yes  Do you have support at home? Yes , lives with wife   Home Care and Equipment/Supplies: Were home health services ordered? no If so, what is the name of the agency? N/A  Has the agency set up a time to come to the patient's home? not applicable Were any new equipment or medical supplies ordered?  No What is the name of the medical supply agency? N/A Were you able to get the supplies/equipment? no Do you have any questions related to the use of the equipment or supplies? No  Functional Questionnaire: (I = Independent and D = Dependent) ADLs: I  Bathing/Dressing- I  Meal Prep- I  Eating- I  Maintaining continence- I  Transferring/Ambulation- I with some assistance at times due to surgical site  Managing Meds- I  Follow up appointments reviewed:   PCP Hospital f/u appt confirmed? Yes  Scheduled to see Dr. Regis Bill on 03/02/2020 @ 9:00 am .  [facility] f/u appt confirmed? No  Scheduled to see Carlena Hurl on 03/17/2020 @ 8:30 am.  Are transportation arrangements needed? No   If their condition worsens, is the pt aware to call PCP or go to the Emergency Dept.? Yes  Was the patient provided with contact information for the PCP's office or ED? Yes  Was to pt encouraged to call back with questions or concerns? Yes

## 2020-02-29 NOTE — Progress Notes (Signed)
Chief Complaint  Patient presents with  . Follow-up    hospital discharge    HPI: Samuel Pearson 74 y.o. 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   Drain to get  Dam tube out. No fever purulence  Hydrating and eating ok  ROS: See pertinent positives and negatives per HPI. No cp sob fever  Had hernia fixed at surgery periumbilical    Past Medical History:  Diagnosis Date  . Allergic rhinitis   . Anxiety   . Detached retina   . Heart murmur    has had one when a child and checked out ok  never? had an echo  . History of chicken pox   . Hx of nonmelanoma skin cancer   . Hypertension   . 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  elevated MCV  No other sx     Family History  Problem Relation Age of Onset  . Arthritis Mother   . Hypertension Mother   . Arthritis Father   . Hypertension Father     Social History   Socioeconomic History  . Marital status: Married    Spouse name: Not on file  . Number of children: Not on file  . Years of education: Not on file  . Highest education level: Not on file  Occupational History  . Not on file  Tobacco Use  . Smoking status: Never Smoker  . Smokeless tobacco: Never Used  Vaping Use  . Vaping Use: Never used  Substance and Sexual Activity  . Alcohol use: Yes    Alcohol/week: 14.0 - 21.0 standard drinks    Types: 14 - 21 Cans of beer per week  . Drug use: No  . Sexual activity: Not on file  Other Topics Concern  . Not on file  Social History Narrative   Retired Film/video editor from [place]   Married   Some Caffeine   Helps take care of  grandkids   recently moved residence    Social Determinants of Radio broadcast assistant Strain:   . Difficulty of Paying Living Expenses: Not on file  Food Insecurity:   . Worried About Charity fundraiser in the Last Year: Not on file  . Ran Out of Food in the Last Year: Not on file  Transportation Needs:   . Lack of Transportation (Medical): Not on file  . Lack of Transportation (Non-Medical): Not on file  Physical Activity:   . Days of Exercise per Week: Not on file  . Minutes of Exercise per Session: Not on file  Stress:   . Feeling of Stress : Not on file  Social Connections:   . Frequency of Communication with Friends and Family: Not on file  . Frequency of Social Gatherings with Friends and Family: Not on file  . Attends Religious Services: Not on file  . Active Member of Clubs or Organizations: Not on file  . Attends Archivist Meetings: Not on file  .  Marital Status: Not on file    Outpatient Medications Prior to Visit  Medication Sig Dispense Refill  . amoxicillin-clavulanate (AUGMENTIN) 875-125 MG tablet Take 1 tablet by mouth every 12 (twelve) hours. 14 tablet 0  . chlorpheniramine (CHLOR-TRIMETON) 4 MG tablet Take 4 mg by mouth 2 (two) times daily as needed for allergies.    . Cyanocobalamin (VITAMIN B-12) 1000 MCG SUBL Place 2 tablets (2,000 mcg total) under the tongue daily. 180 tablet 3  . diphenhydrAMINE (BENADRYL) 25 MG tablet Take 25 mg by mouth every 6 (six) hours as needed.    Marland Kitchen ketotifen (ALAWAY) 0.025 % ophthalmic solution Apply 1 drop to eye 2 (two) times daily.     Marland Kitchen lisinopril (ZESTRIL) 20 MG tablet TAKE 1 TABLET BY MOUTH EVERY DAY 90 tablet 0  . metoprolol tartrate (LOPRESSOR) 25 MG tablet TAKE 1 TABLET BY MOUTH TWICE A DAY 180 tablet 2  . niacin 500 MG tablet Take by mouth.     No facility-administered medications prior to visit.     EXAM:  BP 138/68 (BP Location: Right Arm, Patient Position: Sitting, Cuff Size: Normal)   Pulse 66    Temp 98.6 F (37 C) (Oral)   Ht 5' 2.5" (1.588 m)   Wt 152 lb 12.8 oz (69.3 kg)   SpO2 98%   BMI 27.50 kg/m   Body mass index is 27.5 kg/m.  GENERAL: vitals reviewed and listed above, alert, oriented, appears well hydrated and in no acute distress HEENT: atraumatic, conjunctiva  clear, no obvious abnormalities on inspection of external nose and ears OP :masked  NECK: no obvious masses on inspection palpation  LUNGS: clear to auscultation bilaterally, no wheezes, rales or rhonchi, good air movement CV: HRRR, no clubbing cyanosis or  peripheral edema nl cap refill   Murmur  Present blowing 2-3/6  abd  Drain in place and  Periumbilical scar   Nl gait  MS: moves all extremities without noticeable focal  abnormality PSYCH: pleasant and cooperative, no obvious depression or anxiety Lab Results  Component Value Date   WBC 18.8 (H) 02/25/2020   HGB 14.2 02/25/2020   HCT 44.0 02/25/2020   PLT 224 02/25/2020   GLUCOSE 146 (H) 02/25/2020   CHOL 201 (H) 11/01/2019   TRIG 130.0 11/01/2019   HDL 37.80 (L) 11/01/2019   LDLCALC 137 (H) 11/01/2019   ALT 37 02/25/2020   AST 50 (H) 02/25/2020   NA 138 02/25/2020   K 4.3 02/25/2020   CL 102 02/25/2020   CREATININE 0.75 02/25/2020   BUN 14 02/25/2020   CO2 20 (L) 02/25/2020   TSH 4.42 10/14/2015   PSA 1.90 10/14/2015   HGBA1C 5.8 11/01/2019   BP Readings from Last 3 Encounters:  03/02/20 138/68  02/26/20 (!) 133/58  11/01/19 (!) 148/78    ASSESSMENT AND PLAN:  Discussed the following assessment and plan:  Hospital discharge follow-up  B12 deficiency - Plan: CBC with Differential/Platelet, Vitamin B12, cyanocobalamin ((VITAMIN B-12)) injection 1,000 mcg  Medication management  PA (pernicious anemia) - Plan: CBC with Differential/Platelet, Vitamin B12  S/P cholecystectomy Seems well today  And sx her describes should get better but fu with  Surgery . As planned.   b12 not at goal and  Before  Surgery  Injection today and  cont sl 2000 mcg per day  Disc autimmune  Dx   Has no obvious sx of such ( his mom was on b12 shots)  Has some loose stools after choley and antibiotic for  now but not fb diarrhea  .  After patient left   Further review of multiple  Notes  From hospital  Noted  Results of echocardiogram  Regarding the murmur and showed ai significant  . Finding   Mod to severe AR ( he has declined evaluation past)  He has not  Had sx of concern.  Marland Kitchen  He will be contacted about this and  Advise  Cards  Referral. evaluation)  rim  Message sent to my chart    Patient Instructions  Get flu vaccine    b12 injection  Today   Stay on  b12  Sublingual  2000 mcp per day in interim .  Bowel habits may change after   Getting off the augmentin.   Lab in 2-3 months  Or as  Needed  Send in ?s  If needed    Pernicious Anemia  Anemia is a condition in which the body does not have enough red blood cells or hemoglobin. Hemoglobin is a substance in red blood cells that carries oxygen. Pernicious anemia happens when the body does not have enough of a protein made in the stomach called intrinsic factor (IF). Without enough IF, your digestive tract cannot absorb enough of the vitamin B12 that your body needs to make red blood cells. Normally, you can get enough vitamin B12 from eating foods such as meat, poultry, eggs, and dairy products. If you have pernicious anemia, you do not absorb enough vitamin B12 from your diet, and anemia develops over time. When you have anemia, your organs may not work properly and you may feel very tired. Untreated pernicious anemia can lead to severe symptoms of anemia, including chest pain, heart failure, and permanent nervous system damage. What are the causes? The cause of this condition is not known. Pernicious anemia is believed to be an autoimmune disease. When you have an autoimmune disease, your body's defense system (immune system) mistakenly attacks normal cells in your body. With  pernicious anemia, the immune system attacks cells that line the inside of the stomach (parietal cells). These cells secrete stomach acids and IF. What increases the risk? You are more likely to develop this condition if you:  Are older than age 49.  Have a family history of pernicious anemia.  Are of [place] or [place] descent.  Have another type of autoimmune disease. What are the signs or symptoms? Pernicious anemia symptoms may take many years to develop. This condition usually does not cause symptoms until after a person is older than age 52. Signs and symptoms due to anemia include:  Tiredness.  Light-headedness.  A sore tongue or a burning sensation on the tongue.  A smooth red tongue.  Shortness of breath.  Fast heartbeat.  Chest pain. Stomach symptoms due to gradual loss of parietal cells include:  Nausea or vomiting.  Heartburn.  Weight loss without trying.  Diarrhea. Signs and symptoms due to nervous system damage include:  Dizziness.  Being unsteady while walking.  Tingling or numbness of hands and feet.  Irritability.  Depression.  Hallucinations or delusions.  Loss of smell. How is this diagnosed? In many cases, anemia may be found after you have a routine blood test. This condition may also be diagnosed based on your symptoms, having a family history of the condition, and a physical exam. You may also have tests to confirm the diagnosis. These may include blood tests to:  Count the different types of blood cells (complete blood count or CBC).  Test for different types of anemia.  Check for proteins made by your immune system (antibodies) that attack parietal cells. Almost all people with pernicious anemia have these antibodies.  Check your B12 level. How is this treated? This condition may be treated with vitamin B12 replacement. This may include:  Injections of vitamin B12. This is the most common treatment. You will also  have your blood level of B12 checked regularly. After you have a normal level of vitamin B12, and the anemia has been corrected, the injections can be given about once a month.  Taking a pill by mouth that contains a large dose of vitamin B12.  Using a spray that you breathe in through your nose (nasal spray). A vitamin B12 nasal spray may be used to treat people who are not able to swallow supplements.  Long-term monitoring and regular visits to a health care provider. If the condition is detected and treatment is started in the early stages, most people do not develop complications. Treatment reverses the condition and prevents future anemia, but it must be continued for life. Having pernicious anemia also puts you at higher risk for stomach cancer. Follow these instructions at home:  Take over-the-counter and prescription medicines only as told by your health care provider.  Return to your normal activities as told by your health care provider. Ask your health care provider what activities are safe for you.  Keep all follow-up visits as told by your health care provider. This is important. Contact a health care provider if:  You develop new symptoms.  Your symptoms return or get worse after treatment.  You have stomach pain.  You feel full after eating a small meal.  You have trouble swallowing. Get help right away if:  You have chest pain.  You have a rapid heartbeat.  You have dizziness or you faint.  You have trouble breathing.  You have a feeling as if your heart is skipping beats or fluttering (palpitations).  You have pain, swelling, or redness in an arm or leg.  You cough up blood. These symptoms may represent a serious problem that is an emergency. Do not wait to see if the symptoms will go away. Get medical help right away. Call your local emergency services (911 in the [place] Do not drive yourself to the hospital. Summary  Pernicious anemia happens when your  body cannot make enough red blood cells due to vitamin B12 deficiency.  This condition is usually caused by an autoimmune disease that attacks stomach cells that produce intrinsic factor (IF). You need IF to absorb vitamin B12.  Pernicious anemia symptoms may take many years to develop.  This condition is sometimes discovered during routine blood testing.  Treatment of this condition includes vitamin B12 replacement for life. This information is not intended to replace advice given to you by your health care provider. Make sure you discuss any questions you have with your health care provider. Document Revised: 10/08/2018 Document Reviewed: 08/09/2018 Elsevier Patient Education  2020 [place], [place], MD[x] [place], Vilinda Blanks, PA-C     Standley Brooking. Rushil Kimbrell M.D.

## 2020-03-02 ENCOUNTER — Encounter: Payer: Self-pay | Admitting: Internal Medicine

## 2020-03-02 ENCOUNTER — Other Ambulatory Visit: Payer: Self-pay

## 2020-03-02 ENCOUNTER — Ambulatory Visit (INDEPENDENT_AMBULATORY_CARE_PROVIDER_SITE_OTHER): Payer: Medicare Other | Admitting: Internal Medicine

## 2020-03-02 VITALS — BP 138/68 | HR 66 | Temp 98.6°F | Ht 62.5 in | Wt 152.8 lb

## 2020-03-02 DIAGNOSIS — D510 Vitamin B12 deficiency anemia due to intrinsic factor deficiency: Secondary | ICD-10-CM

## 2020-03-02 DIAGNOSIS — Z9049 Acquired absence of other specified parts of digestive tract: Secondary | ICD-10-CM

## 2020-03-02 DIAGNOSIS — E538 Deficiency of other specified B group vitamins: Secondary | ICD-10-CM

## 2020-03-02 DIAGNOSIS — Z79899 Other long term (current) drug therapy: Secondary | ICD-10-CM

## 2020-03-02 DIAGNOSIS — Z09 Encounter for follow-up examination after completed treatment for conditions other than malignant neoplasm: Secondary | ICD-10-CM

## 2020-03-02 MED ORDER — CYANOCOBALAMIN 1000 MCG/ML IJ SOLN
1000.0000 ug | Freq: Once | INTRAMUSCULAR | Status: AC
  Administered 2020-03-02: 1000 ug via INTRAMUSCULAR

## 2020-03-02 NOTE — Patient Instructions (Addendum)
Get flu vaccine    b12 injection  Today   Stay on  b12  Sublingual  2000 mcp per day in interim .  Bowel habits may change after   Getting off the augmentin.   Lab in 2-3 months  Or as  Needed  Send in ?s  If needed    Pernicious Anemia  Anemia is a condition in which the body does not have enough red blood cells or hemoglobin. Hemoglobin is a substance in red blood cells that carries oxygen. Pernicious anemia happens when the body does not have enough of a protein made in the stomach called intrinsic factor (IF). Without enough IF, your digestive tract cannot absorb enough of the vitamin B12 that your body needs to make red blood cells. Normally, you can get enough vitamin B12 from eating foods such as meat, poultry, eggs, and dairy products. If you have pernicious anemia, you do not absorb enough vitamin B12 from your diet, and anemia develops over time. When you have anemia, your organs may not work properly and you may feel very tired. Untreated pernicious anemia can lead to severe symptoms of anemia, including chest pain, heart failure, and permanent nervous system damage. What are the causes? The cause of this condition is not known. Pernicious anemia is believed to be an autoimmune disease. When you have an autoimmune disease, your body's defense system (immune system) mistakenly attacks normal cells in your body. With pernicious anemia, the immune system attacks cells that line the inside of the stomach (parietal cells). These cells secrete stomach acids and IF. What increases the risk? You are more likely to develop this condition if you:  Are older than age 18.  Have a family history of pernicious anemia.  Are of [place] European or [place] descent.  Have another type of autoimmune disease. What are the signs or symptoms? Pernicious anemia symptoms may take many years to develop. This condition usually does not cause symptoms until after a person is older than age 34.  Signs and symptoms due to anemia include:  Tiredness.  Light-headedness.  A sore tongue or a burning sensation on the tongue.  A smooth red tongue.  Shortness of breath.  Fast heartbeat.  Chest pain. Stomach symptoms due to gradual loss of parietal cells include:  Nausea or vomiting.  Heartburn.  Weight loss without trying.  Diarrhea. Signs and symptoms due to nervous system damage include:  Dizziness.  Being unsteady while walking.  Tingling or numbness of hands and feet.  Irritability.  Depression.  Hallucinations or delusions.  Loss of smell. How is this diagnosed? In many cases, anemia may be found after you have a routine blood test. This condition may also be diagnosed based on your symptoms, having a family history of the condition, and a physical exam. You may also have tests to confirm the diagnosis. These may include blood tests to:  Count the different types of blood cells (complete blood count or CBC).  Test for different types of anemia.  Check for proteins made by your immune system (antibodies) that attack parietal cells. Almost all people with pernicious anemia have these antibodies.  Check your B12 level. How is this treated? This condition may be treated with vitamin B12 replacement. This may include:  Injections of vitamin B12. This is the most common treatment. You will also have your blood level of B12 checked regularly. After you have a normal level of vitamin B12, and the anemia has been corrected, the injections can  be given about once a month.  Taking a pill by mouth that contains a large dose of vitamin B12.  Using a spray that you breathe in through your nose (nasal spray). A vitamin B12 nasal spray may be used to treat people who are not able to swallow supplements.  Long-term monitoring and regular visits to a health care provider. If the condition is detected and treatment is started in the early stages, most people do not  develop complications. Treatment reverses the condition and prevents future anemia, but it must be continued for life. Having pernicious anemia also puts you at higher risk for stomach cancer. Follow these instructions at home:  Take over-the-counter and prescription medicines only as told by your health care provider.  Return to your normal activities as told by your health care provider. Ask your health care provider what activities are safe for you.  Keep all follow-up visits as told by your health care provider. This is important. Contact a health care provider if:  You develop new symptoms.  Your symptoms return or get worse after treatment.  You have stomach pain.  You feel full after eating a small meal.  You have trouble swallowing. Get help right away if:  You have chest pain.  You have a rapid heartbeat.  You have dizziness or you faint.  You have trouble breathing.  You have a feeling as if your heart is skipping beats or fluttering (palpitations).  You have pain, swelling, or redness in an arm or leg.  You cough up blood. These symptoms may represent a serious problem that is an emergency. Do not wait to see if the symptoms will go away. Get medical help right away. Call your local emergency services (911 in the [place] Do not drive yourself to the hospital. Summary  Pernicious anemia happens when your body cannot make enough red blood cells due to vitamin B12 deficiency.  This condition is usually caused by an autoimmune disease that attacks stomach cells that produce intrinsic factor (IF). You need IF to absorb vitamin B12.  Pernicious anemia symptoms may take many years to develop.  This condition is sometimes discovered during routine blood testing.  Treatment of this condition includes vitamin B12 replacement for life. This information is not intended to replace advice given to you by your health care provider. Make sure you discuss any questions you have  with your health care provider. Document Revised: 10/08/2018 Document Reviewed: 08/09/2018 Elsevier Patient Education  2020 [place], [place], MD[x] [place], Vilinda Blanks, [place]

## 2020-03-31 ENCOUNTER — Other Ambulatory Visit: Payer: Self-pay | Admitting: Internal Medicine

## 2020-05-26 NOTE — Addendum Note (Signed)
Addended by: Marrion Coy on: 05/26/2020 12:59 PM   Modules accepted: Orders

## 2020-06-02 ENCOUNTER — Telehealth: Payer: Self-pay | Admitting: Internal Medicine

## 2020-06-02 ENCOUNTER — Other Ambulatory Visit (INDEPENDENT_AMBULATORY_CARE_PROVIDER_SITE_OTHER): Payer: Medicare Other

## 2020-06-02 ENCOUNTER — Other Ambulatory Visit: Payer: Self-pay

## 2020-06-02 DIAGNOSIS — E538 Deficiency of other specified B group vitamins: Secondary | ICD-10-CM | POA: Diagnosis not present

## 2020-06-02 DIAGNOSIS — I10 Essential (primary) hypertension: Secondary | ICD-10-CM

## 2020-06-02 DIAGNOSIS — R718 Other abnormality of red blood cells: Secondary | ICD-10-CM

## 2020-06-02 DIAGNOSIS — Z09 Encounter for follow-up examination after completed treatment for conditions other than malignant neoplasm: Secondary | ICD-10-CM

## 2020-06-02 DIAGNOSIS — D510 Vitamin B12 deficiency anemia due to intrinsic factor deficiency: Secondary | ICD-10-CM

## 2020-06-02 LAB — VITAMIN B12: Vitamin B-12: 562 pg/mL (ref 211–911)

## 2020-06-02 NOTE — Telephone Encounter (Signed)
Dr. Regis Bill - I drew this young man's blood today and I got a call from the lab stating that the lavender tube clotted/they asked to have him come in for a redraw and for [place] to draw a lavender tube as well as a light blue tube just in case/He was very understanding and is scheduled for this tomorrow morning/I have the order placed with the additional instructions so nothing is to be done on your end/I just wanted to inform you/thx dmf

## 2020-06-03 ENCOUNTER — Other Ambulatory Visit (INDEPENDENT_AMBULATORY_CARE_PROVIDER_SITE_OTHER): Payer: Medicare Other

## 2020-06-03 ENCOUNTER — Other Ambulatory Visit: Payer: Medicare Other

## 2020-06-03 DIAGNOSIS — I10 Essential (primary) hypertension: Secondary | ICD-10-CM

## 2020-06-03 DIAGNOSIS — R718 Other abnormality of red blood cells: Secondary | ICD-10-CM | POA: Diagnosis not present

## 2020-06-03 DIAGNOSIS — Z09 Encounter for follow-up examination after completed treatment for conditions other than malignant neoplasm: Secondary | ICD-10-CM

## 2020-06-03 LAB — CBC WITH DIFFERENTIAL/PLATELET
Basophils Absolute: 0 10*3/uL (ref 0.0–0.1)
Basophils Relative: 0.6 % (ref 0.0–3.0)
Eosinophils Absolute: 0.2 10*3/uL (ref 0.0–0.7)
Eosinophils Relative: 2.5 % (ref 0.0–5.0)
HCT: 43 % (ref 39.0–52.0)
Hemoglobin: 14.3 g/dL (ref 13.0–17.0)
Lymphocytes Relative: 17.4 % (ref 12.0–46.0)
Lymphs Abs: 1.1 10*3/uL (ref 0.7–4.0)
MCHC: 33.3 g/dL (ref 30.0–36.0)
MCV: 96.9 fl (ref 78.0–100.0)
Monocytes Absolute: 1 10*3/uL (ref 0.1–1.0)
Monocytes Relative: 16.5 % — ABNORMAL HIGH (ref 3.0–12.0)
Neutro Abs: 4 10*3/uL (ref 1.4–7.7)
Neutrophils Relative %: 63 % (ref 43.0–77.0)
Platelets: 213 10*3/uL (ref 150.0–400.0)
RBC: 4.44 Mil/uL (ref 4.22–5.81)
RDW: 13.3 % (ref 11.5–15.5)
WBC: 6.3 10*3/uL (ref 4.0–10.5)

## 2020-06-08 NOTE — Progress Notes (Signed)
Continue B12 see previous message on the CBC and differential

## 2020-06-08 NOTE — Progress Notes (Signed)
So blood count and B12 levels are much better.  Continue B12 supplementation.     Please reconsider seeing cardiology to  at least getting an opinion about your heart murmur.  I would like the opinion about options if medications will be helpful for your heart function.  I am aware you would decline any surgery but there may be other interventions that would be helpful to maintain good cardiac health .  May I refer to cardiology.?

## 2020-07-07 ENCOUNTER — Other Ambulatory Visit: Payer: Self-pay | Admitting: Internal Medicine

## 2020-10-03 ENCOUNTER — Other Ambulatory Visit: Payer: Self-pay | Admitting: Internal Medicine

## 2020-11-18 ENCOUNTER — Other Ambulatory Visit: Payer: Self-pay

## 2020-11-18 ENCOUNTER — Ambulatory Visit (INDEPENDENT_AMBULATORY_CARE_PROVIDER_SITE_OTHER): Payer: Medicare Other | Admitting: Internal Medicine

## 2020-11-18 ENCOUNTER — Encounter: Payer: Self-pay | Admitting: Internal Medicine

## 2020-11-18 VITALS — BP 160/60 | HR 69 | Temp 97.0°F | Ht 62.0 in | Wt 157.6 lb

## 2020-11-18 DIAGNOSIS — D510 Vitamin B12 deficiency anemia due to intrinsic factor deficiency: Secondary | ICD-10-CM

## 2020-11-18 DIAGNOSIS — Z0000 Encounter for general adult medical examination without abnormal findings: Secondary | ICD-10-CM

## 2020-11-18 DIAGNOSIS — I10 Essential (primary) hypertension: Secondary | ICD-10-CM

## 2020-11-18 DIAGNOSIS — R7301 Impaired fasting glucose: Secondary | ICD-10-CM | POA: Diagnosis not present

## 2020-11-18 DIAGNOSIS — H579 Unspecified disorder of eye and adnexa: Secondary | ICD-10-CM | POA: Diagnosis not present

## 2020-11-18 DIAGNOSIS — J309 Allergic rhinitis, unspecified: Secondary | ICD-10-CM

## 2020-11-18 DIAGNOSIS — E538 Deficiency of other specified B group vitamins: Secondary | ICD-10-CM

## 2020-11-18 DIAGNOSIS — I351 Nonrheumatic aortic (valve) insufficiency: Secondary | ICD-10-CM | POA: Diagnosis not present

## 2020-11-18 DIAGNOSIS — Z79899 Other long term (current) drug therapy: Secondary | ICD-10-CM | POA: Diagnosis not present

## 2020-11-18 NOTE — Patient Instructions (Addendum)
Updated lab t. VIt b12  continuing.   Bp goal 130/80  range .  Or at least below 140/90 Send in some readings  Reconsider  seeing cardiology .  Avoiding simple processed carbs may help control weight accumulation. Consider getting the Shingrix vaccine updated that can be given at pharmacy.  Medicare will not pay for it when given in the office.  Suggest see eye doctor team about any other steps for eye management assuming this is allergy. Consider seeing allergist for updated evaluation to get more information about avoidance techniques and to if having ongoing symptoms.   IMPRESSION: 1. Distended gallbladder with small stone and mild gallbladder wall thickening and pericholecystic infiltration likely representing acute cholecystitis. 2. Mild diffuse fatty infiltration of the liver. 3. Bilateral parapelvic renal cysts. 4. Left inguinal hernia containing a portion of the sigmoid colon. No proximal obstruction. 5. Fat containing hernias in the periumbilical and right groin region. 6. Prostate gland is diffusely enlarged. 7. Patchy infiltration or atelectasis in the lung bases. 8. Small esophageal hiatal hernia. 9. Aortic atherosclerosis.   Aortic Atherosclerosis (ICD10-I70.0).     Electronically Signed   By: Lucienne Capers M.D.   On: 02/25/2020 02:39  Health Maintenance, Male Adopting a healthy lifestyle and getting preventive care are important in promoting health and wellness. Ask your health care provider about: The right schedule for you to have regular tests and exams. Things you can do on your own to prevent diseases and keep yourself healthy. What should I know about diet, weight, and exercise? Eat a healthy diet  Eat a diet that includes plenty of vegetables, fruits, low-fat dairy products, and lean protein. Do not eat a lot of foods that are high in solid fats, added sugars, or sodium.  Maintain a healthy weight Body mass index (BMI) is a measurement that can  be used to identify possible weight problems. It estimates body fat based on height and weight. Your health care provider can help determine your BMI and help you achieve or maintain ahealthy weight. Get regular exercise Get regular exercise. This is one of the most important things you can do for your health. Most adults should: Exercise for at least 150 minutes each week. The exercise should increase your heart rate and make you sweat (moderate-intensity exercise). Do strengthening exercises at least twice a week. This is in addition to the moderate-intensity exercise. Spend less time sitting. Even light physical activity can be beneficial. Watch cholesterol and blood lipids Have your blood tested for lipids and cholesterol at 76 years of age, then havethis test every 5 years. You may need to have your cholesterol levels checked more often if: Your lipid or cholesterol levels are high. You are older than 76 years of age. You are at high risk for heart disease. What should I know about cancer screening? Many types of cancers can be detected early and may often be prevented. Depending on your health history and family history, you may need to have cancer screening at various ages. This may include screening for: Colorectal cancer. Prostate cancer. Skin cancer. Lung cancer. What should I know about heart disease, diabetes, and high blood pressure? Blood pressure and heart disease High blood pressure causes heart disease and increases the risk of stroke. This is more likely to develop in people who have high blood pressure readings, are of African descent, or are overweight. Talk with your health care provider about your target blood pressure readings. Have your blood pressure checked: Every 3-5  years if you are 16-26 years of age. Every year if you are 21 years old or older. If you are between the ages of 56 and 69 and are a current or former smoker, ask your health care provider if you  should have a one-time screening for abdominal aortic aneurysm (AAA). Diabetes Have regular diabetes screenings. This checks your fasting blood sugar level. Have the screening done: Once every three years after age 66 if you are at a normal weight and have a low risk for diabetes. More often and at a younger age if you are overweight or have a high risk for diabetes. What should I know about preventing infection? Hepatitis B If you have a higher risk for hepatitis B, you should be screened for this virus. Talk with your health care provider to find out if you are at risk forhepatitis B infection. Hepatitis C Blood testing is recommended for: Everyone born from [birth year] through 1966. Anyone with known risk factors for hepatitis C. Sexually transmitted infections (STIs) You should be screened each year for STIs, including gonorrhea and chlamydia, if: You are sexually active and are younger than 76 years of age. You are older than 76 years of age and your health care provider tells you that you are at risk for this type of infection. Your sexual activity has changed since you were last screened, and you are at increased risk for chlamydia or gonorrhea. Ask your health care provider if you are at risk. Ask your health care provider about whether you are at high risk for HIV. Your health care provider may recommend a prescription medicine to help prevent HIV infection. If you choose to take medicine to prevent HIV, you should first get tested for HIV. You should then be tested every 3 months for as long as you are taking the medicine. Follow these instructions at home: Lifestyle Do not use any products that contain nicotine or tobacco, such as cigarettes, e-cigarettes, and chewing tobacco. If you need help quitting, ask your health care provider. Do not use street drugs. Do not share needles. Ask your health care provider for help if you need support or information about quitting drugs. Alcohol  use Do not drink alcohol if your health care provider tells you not to drink. If you drink alcohol: Limit how much you have to 0-2 drinks a day. Be aware of how much alcohol is in your drink. In the [place] one drink equals one 12 oz bottle of beer (355 mL), one 5 oz glass of wine (148 mL), or one 1 oz glass of hard liquor (44 mL). General instructions Schedule regular health, dental, and eye exams. Stay current with your vaccines. Tell your health care provider if: You often feel depressed. You have ever been abused or do not feel safe at home. Summary Adopting a healthy lifestyle and getting preventive care are important in promoting health and wellness. Follow your health care provider's instructions about healthy diet, exercising, and getting tested or screened for diseases. Follow your health care provider's instructions on monitoring your cholesterol and blood pressure. This information is not intended to replace advice given to you by your health care provider. Make sure you discuss any questions you have with your healthcare provider. Document Revised: 04/18/2018 Document Reviewed: 04/18/2018 Elsevier Patient Education  2022 [employer].

## 2020-11-18 NOTE — Progress Notes (Signed)
Chief Complaint  Patient presents with   Annual Exam    HPI: Samuel Pearson 75 y.o. comes in today for Preventive Medicare exam/ wellness visit  and medication .Since last visit. Bad allergy year.  Eyes  feels like Vaseline   more than nose .  Uses eye drops antiinflammatory and   taste in  mouth  had dc .   Eye drops help  [place].   BP Before 153/60 today not checking recently   other dealing issues  Walking   dogs bid  and hills  no problem .  B12  2 per day  SL and ocass  bcomplex .  No neuro sx numbness or weakness Having cnetral enlargement Hx hernia when see at [facility].  Denies any cv sx and limitations and  still  no agreeing to get cv evaluation.  "I'v always had this m and wrestled and had no limitations physically.   Ate lunch   beef barbecue and bun and watermelon. Water. Today NF  Health Maintenance  Topic Date Due   Zoster Vaccines- Shingrix (1 of 2) Never done   COVID-19 Vaccine (4 - Booster for Pfizer series) 07/06/2020   INFLUENZA VACCINE  12/07/2020   TETANUS/TDAP  03/19/2023   Hepatitis C Screening  Completed   PNA vac Low Risk Adult  Completed   HPV VACCINES  Aged Out   Health Maintenance Review LIFESTYLE:  Exercise:  active  Tobacco/ETS: Alcohol:  1 per day   Sugar beverages: Sleep:up with nocturia at times  Drug use: no HH:2 plus   Hearing:  ok  Vision:  No limitations at present . See above Last eye check UTD Safety:  Has smoke detector and wears seat belts.   No excess sun exposure. Sees dentist regularly. Falls: n Memory: Felt to be good  , no concern from her or her family. Depression: No anhedonia unusual crying or depressive symptoms  tends to be reported as anxious   wife  send in info about poss taking paxil  as sis in law helped but this  Nutrition: Eats well balanced diet; adequate calcium and vitamin D. No swallowing chewing problems. Injury: no major injuries in the last six months. Other healthcare providers:  Reviewed today  . Preventive parameters: Reviewed  ADLS:   There are no problems or need for assistance  driving, feeding, obtaining food, dressing, toileting and bathing, managing money using phone. is independent.    ROS:  see HPI  REST of 12 system review negative except as per HPI   Past Medical History:  Diagnosis Date   Allergic rhinitis    Anxiety    Detached retina    Heart murmur    has had one when a child and checked out ok  never? 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  elevated MCV  No other sx     Family History  Problem Relation Age of Onset   Arthritis Mother    Hypertension Mother    Arthritis Father    Hypertension Father     Social History   Socioeconomic History   Marital status: Married    Spouse name: Not on file   Number of children: Not on file   Years of education: Not on file   Highest education level: Not  on file  Occupational History   Not on file  Tobacco Use   Smoking status: Never   Smokeless tobacco: Never  Vaping Use   Vaping Use: Never used  Substance and Sexual Activity   Alcohol use: Yes    Alcohol/week: 14.0 - 21.0 standard drinks    Types: 14 - 21 Cans of beer per week   Drug use: No   Sexual activity: Not on file  Other Topics Concern   Not on file  Social History Narrative   Retired Film/video editor from [place]   Married   Some Caffeine   Helps take care of grandkids   recently moved residence    Social Determinants of Radio broadcast assistant Strain: Not on [employer] Insecurity: Not on file  Transportation Needs: Not on file  Physical Activity: Not on file  Stress: Not on file  Social Connections: Not on file    Outpatient Encounter Medications as of 11/18/2020  Medication Sig   chlorpheniramine (CHLOR-TRIMETON) 4 MG tablet Take 4 mg by mouth 2 (two) times  daily as needed for allergies.   Cyanocobalamin (VITAMIN B-12) 1000 MCG SUBL Place 2 tablets (2,000 mcg total) under the tongue daily.   diphenhydrAMINE (BENADRYL) 25 MG tablet Take 25 mg by mouth every 6 (six) hours as needed.   ketotifen (ZADITOR) 0.025 % ophthalmic solution Apply 1 drop to eye 2 (two) times daily.    lisinopril (ZESTRIL) 20 MG tablet TAKE 1 TABLET BY MOUTH EVERY DAY   metoprolol tartrate (LOPRESSOR) 25 MG tablet TAKE 1 TABLET BY MOUTH TWICE A DAY   niacin 500 MG tablet Take by mouth.   [DISCONTINUED] amoxicillin-clavulanate (AUGMENTIN) 875-125 MG tablet TAKE 1 TABLET BY MOUTH EVERY TWELVE HOURS.   No facility-administered encounter medications on file as of 11/18/2020.    EXAM:  BP (!) 160/60 (BP Location: Left Arm, Patient Position: Sitting, Cuff Size: Normal)   Pulse 69   Temp (!) 97 F (36.1 C) (Oral)   Ht 5\' 2"  (1.575 m)   Wt 157 lb 9.6 oz (71.5 kg)   SpO2 95%   BMI 28.83 kg/m   Body mass index is 28.83 kg/m.  Physical Exam: Vital signs reviewed WUJ:WJXB is a well-developed well-nourished alert cooperative   who appears stated age in no acute distress.  HEENT: normocephalic atraumatic , Eyes: PERRL EOM's full, conjunctiva clear,clear dc  Nares: paten,t no deformity discharge or tenderness., Ears: no deformity EAC's clear TMs with normal landmarks. Mouth:masked  NECK: supple without masses, thyromegaly or bruits. CHEST/PULM:  Clear to auscultation and percussion breath sounds equal no wheeze , rales or rhonchi. No chest wall deformities or tenderness. CV: PMI is nondisplaced, S1 S2 no gallops,  rubs.  2- 3/6 syt diastolic m  no rubs Peripheral pulses are full bounding without delay.No JVD .  Pmi seems   non displaced  ABDOMEN: Bowel sounds normal nontender  No guard or rebound, no hepato splenomegal no CVA tenderness.  Full inguinal area nontender  standing  Extremtities:  No clubbing cyanosis or edema, no acute joint swelling or redness no focal  atrophy NEURO:  Oriented x3, cranial nerves 3-12 appear to be intact, no obvious focal weakness,gait within normal limits no abnormal reflexes or asymmetrical SKIN: No acute rashes normal turgor, color, no bruising or petechiae. PSYCH: Oriented, good eye contact,cognition and judgment appear normal.  See phq and gad  LN: no cervical axillary inguinal adenopathy No noted deficits in memory,  attention, and speech.   Lab Results  Component Value Date   WBC 6.3 06/03/2020   HGB 14.3 06/03/2020   HCT 43.0 06/03/2020   PLT 213.0 06/03/2020   GLUCOSE 146 (H) 02/25/2020   CHOL 201 (H) 11/01/2019   TRIG 130.0 11/01/2019   HDL 37.80 (L) 11/01/2019   LDLCALC 137 (H) 11/01/2019   ALT 37 02/25/2020   AST 50 (H) 02/25/2020   NA 138 02/25/2020   K 4.3 02/25/2020   CL 102 02/25/2020   CREATININE 0.75 02/25/2020   BUN 14 02/25/2020   CO2 20 (L) 02/25/2020   TSH 4.42 10/14/2015   PSA 1.90 10/14/2015   HGBA1C 5.8 11/01/2019   IMPRESSION: 1. Distended gallbladder with small stone and mild gallbladder wall thickening and pericholecystic infiltration likely representing acute cholecystitis. 2. Mild diffuse fatty infiltration of the liver. 3. Bilateral parapelvic renal cysts. 4. Left inguinal hernia containing a portion of the sigmoid colon. No proximal obstruction. 5. Fat containing hernias in the periumbilical and right groin region. 6. Prostate gland is diffusely enlarged. 7. Patchy infiltration or atelectasis in the lung bases. 8. Small esophageal hiatal hernia. 9. Aortic atherosclerosis.   Aortic Atherosclerosis (ICD10-I70.0).     Electronically Signed   By: Lucienne Capers M.D.   On: 02/25/2020 02:39 ASSESSMENT AND PLAN:  Discussed the following assessment and plan:  Visit for preventive health examination  PA (pernicious anemia) - Plan: CBC with Differential/Platelet, Basic metabolic panel, Hemoglobin A1c, Hepatic function panel, Lipid panel, TSH, Vitamin B12  Fasting  hyperglycemia - Plan: CBC with Differential/Platelet, Basic metabolic panel, Hemoglobin A1c, Hepatic function panel, Lipid panel, TSH, Vitamin B12  B12 deficiency - Plan: CBC with Differential/Platelet, Basic metabolic panel, Hemoglobin A1c, Hepatic function panel, Lipid panel, TSH, Vitamin B12  Essential hypertension - Plan: CBC with Differential/Platelet, Basic metabolic panel, Hemoglobin A1c, Hepatic function panel, Lipid panel, TSH, Vitamin B12  Medication management - Plan: CBC with Differential/Platelet, Basic metabolic panel, Hemoglobin A1c, Hepatic function panel, Lipid panel, TSH, Vitamin B12  Allergic rhinitis, unspecified seasonality, unspecified trigger - Plan: CBC with Differential/Platelet, Basic metabolic panel, Hemoglobin A1c, Hepatic function panel, Lipid panel, TSH, Vitamin B12  Eye symptoms - poss allergic  other  Aortic valve insufficiency, etiology of cardiac valve disease unspecified Declines cards eval and statins   can see  new eye doc for opinion  has avoided Flonase .  in past to avoid eye  se .Anxiety issues reactive  personality  disc ssri  use poss in age group 1 Patient Care Team: Burnis Medin, MD as PCP - General dickinson Riley Churches, Lucita Ferrara, DO (Dermatology)  Patient Instructions  Updated lab t. VIt b12  continuing.   Bp goal 130/80  range .  Or at least below 140/90 Send in some readings  Reconsider  seeing cardiology .  Avoiding simple processed carbs may help control weight accumulation. Consider getting the Shingrix vaccine updated that can be given at pharmacy.  Medicare will not pay for it when given in the office.  Suggest see eye doctor team about any other steps for eye management assuming this is allergy. Consider seeing allergist for updated evaluation to get more information about avoidance techniques and to if having ongoing symptoms.   IMPRESSION: 1. Distended gallbladder with small stone and mild gallbladder wall thickening and  pericholecystic infiltration likely representing acute cholecystitis. 2. Mild diffuse fatty infiltration of the liver. 3. Bilateral parapelvic renal cysts. 4. Left inguinal hernia containing a portion of the sigmoid colon. No  proximal obstruction. 5. Fat containing hernias in the periumbilical and right groin region. 6. Prostate gland is diffusely enlarged. 7. Patchy infiltration or atelectasis in the lung bases. 8. Small esophageal hiatal hernia. 9. Aortic atherosclerosis.   Aortic Atherosclerosis (ICD10-I70.0).     Electronically Signed   By: Lucienne Capers M.D.   On: 02/25/2020 02:39  Health Maintenance, Male Adopting a healthy lifestyle and getting preventive care are important in promoting health and wellness. Ask your health care provider about: The right schedule for you to have regular tests and exams. Things you can do on your own to prevent diseases and keep yourself healthy. What should I know about diet, weight, and exercise? Eat a healthy diet  Eat a diet that includes plenty of vegetables, fruits, low-fat dairy products, and lean protein. Do not eat a lot of foods that are high in solid fats, added sugars, or sodium.  Maintain a healthy weight Body mass index (BMI) is a measurement that can be used to identify possible weight problems. It estimates body fat based on height and weight. Your health care provider can help determine your BMI and help you achieve or maintain ahealthy weight. Get regular exercise Get regular exercise. This is one of the most important things you can do for your health. Most adults should: Exercise for at least 150 minutes each week. The exercise should increase your heart rate and make you sweat (moderate-intensity exercise). Do strengthening exercises at least twice a week. This is in addition to the moderate-intensity exercise. Spend less time sitting. Even light physical activity can be beneficial. Watch cholesterol and blood  lipids Have your blood tested for lipids and cholesterol at 76 years of age, then havethis test every 5 years. You may need to have your cholesterol levels checked more often if: Your lipid or cholesterol levels are high. You are older than 76 years of age. You are at high risk for heart disease. What should I know about cancer screening? Many types of cancers can be detected early and may often be prevented. Depending on your health history and family history, you may need to have cancer screening at various ages. This may include screening for: Colorectal cancer. Prostate cancer. Skin cancer. Lung cancer. What should I know about heart disease, diabetes, and high blood pressure? Blood pressure and heart disease High blood pressure causes heart disease and increases the risk of stroke. This is more likely to develop in people who have high blood pressure readings, are of African descent, or are overweight. Talk with your health care provider about your target blood pressure readings. Have your blood pressure checked: Every 3-5 years if you are 71-38 years of age. Every year if you are 54 years old or older. If you are between the ages of 31 and 80 and are a current or former smoker, ask your health care provider if you should have a one-time screening for abdominal aortic aneurysm (AAA). Diabetes Have regular diabetes screenings. This checks your fasting blood sugar level. Have the screening done: Once every three years after age 48 if you are at a normal weight and have a low risk for diabetes. More often and at a younger age if you are overweight or have a high risk for diabetes. What should I know about preventing infection? Hepatitis B If you have a higher risk for hepatitis B, you should be screened for this virus. Talk with your health care provider to find out if you are at risk forhepatitis  B infection. Hepatitis C Blood testing is recommended for: Everyone born from [birth year]  through 1965. Anyone with known risk factors for hepatitis C. Sexually transmitted infections (STIs) You should be screened each year for STIs, including gonorrhea and chlamydia, if: You are sexually active and are younger than 76 years of age. You are older than 76 years of age and your health care provider tells you that you are at risk for this type of infection. Your sexual activity has changed since you were last screened, and you are at increased risk for chlamydia or gonorrhea. Ask your health care provider if you are at risk. Ask your health care provider about whether you are at high risk for HIV. Your health care provider may recommend a prescription medicine to help prevent HIV infection. If you choose to take medicine to prevent HIV, you should first get tested for HIV. You should then be tested every 3 months for as long as you are taking the medicine. Follow these instructions at home: Lifestyle Do not use any products that contain nicotine or tobacco, such as cigarettes, e-cigarettes, and chewing tobacco. If you need help quitting, ask your health care provider. Do not use street drugs. Do not share needles. Ask your health care provider for help if you need support or information about quitting drugs. Alcohol use Do not drink alcohol if your health care provider tells you not to drink. If you drink alcohol: Limit how much you have to 0-2 drinks a day. Be aware of how much alcohol is in your drink. In the [place] one drink equals one 12 oz bottle of beer (355 mL), one 5 oz glass of wine (148 mL), or one 1 oz glass of hard liquor (44 mL). General instructions Schedule regular health, dental, and eye exams. Stay current with your vaccines. Tell your health care provider if: You often feel depressed. You have ever been abused or do not feel safe at home. Summary Adopting a healthy lifestyle and getting preventive care are important in promoting health and wellness. Follow your  health care provider's instructions about healthy diet, exercising, and getting tested or screened for diseases. Follow your health care provider's instructions on monitoring your cholesterol and blood pressure. This information is not intended to replace advice given to you by your health care provider. Make sure you discuss any questions you have with your healthcare provider. Document Revised: 04/18/2018 Document Reviewed: 04/18/2018 Elsevier Patient Education  2022 [place]. Onnie Alatorre M.D.

## 2020-11-20 ENCOUNTER — Other Ambulatory Visit (INDEPENDENT_AMBULATORY_CARE_PROVIDER_SITE_OTHER): Payer: Medicare Other

## 2020-11-20 ENCOUNTER — Other Ambulatory Visit: Payer: Self-pay

## 2020-11-20 DIAGNOSIS — J309 Allergic rhinitis, unspecified: Secondary | ICD-10-CM

## 2020-11-20 DIAGNOSIS — R946 Abnormal results of thyroid function studies: Secondary | ICD-10-CM

## 2020-11-20 DIAGNOSIS — I10 Essential (primary) hypertension: Secondary | ICD-10-CM | POA: Diagnosis not present

## 2020-11-20 DIAGNOSIS — R7301 Impaired fasting glucose: Secondary | ICD-10-CM | POA: Diagnosis not present

## 2020-11-20 DIAGNOSIS — E538 Deficiency of other specified B group vitamins: Secondary | ICD-10-CM

## 2020-11-20 DIAGNOSIS — Z79899 Other long term (current) drug therapy: Secondary | ICD-10-CM | POA: Diagnosis not present

## 2020-11-20 DIAGNOSIS — D510 Vitamin B12 deficiency anemia due to intrinsic factor deficiency: Secondary | ICD-10-CM | POA: Diagnosis not present

## 2020-11-20 LAB — TSH: TSH: 6.62 u[IU]/mL — ABNORMAL HIGH (ref 0.35–5.50)

## 2020-11-20 LAB — BASIC METABOLIC PANEL
BUN: 19 mg/dL (ref 6–23)
CO2: 26 mEq/L (ref 19–32)
Calcium: 9.1 mg/dL (ref 8.4–10.5)
Chloride: 105 mEq/L (ref 96–112)
Creatinine, Ser: 0.8 mg/dL (ref 0.40–1.50)
GFR: 85.99 mL/min (ref >60.00)
Glucose, Bld: 110 mg/dL — ABNORMAL HIGH (ref 70–99)
Potassium: 4.3 mEq/L (ref 3.5–5.1)
Sodium: 139 mEq/L (ref 135–145)

## 2020-11-20 LAB — LIPID PANEL
Cholesterol: 187 mg/dL (ref 0–200)
HDL: 34.4 mg/dL — ABNORMAL LOW (ref >39.00)
LDL Cholesterol: 118 mg/dL — ABNORMAL HIGH (ref 0–99)
NonHDL: 152.89
Total CHOL/HDL Ratio: 5
Triglycerides: 172 mg/dL — ABNORMAL HIGH (ref 0.0–149.0)
VLDL: 34.4 mg/dL (ref 0.0–40.0)

## 2020-11-20 LAB — CBC WITH DIFFERENTIAL/PLATELET
Basophils Absolute: 0 10*3/uL (ref 0.0–0.1)
Basophils Relative: 0.5 % (ref 0.0–3.0)
Eosinophils Absolute: 0.2 10*3/uL (ref 0.0–0.7)
Eosinophils Relative: 3 % (ref 0.0–5.0)
HCT: 41.8 % (ref 39.0–52.0)
Hemoglobin: 14.3 g/dL (ref 13.0–17.0)
Lymphocytes Relative: 18.2 % (ref 12.0–46.0)
Lymphs Abs: 1.1 10*3/uL (ref 0.7–4.0)
MCHC: 34.1 g/dL (ref 30.0–36.0)
MCV: 96.2 fl (ref 78.0–100.0)
Monocytes Absolute: 1 10*3/uL (ref 0.1–1.0)
Monocytes Relative: 15.5 % — ABNORMAL HIGH (ref 3.0–12.0)
Neutro Abs: 3.9 10*3/uL (ref 1.4–7.7)
Neutrophils Relative %: 62.8 % (ref 43.0–77.0)
Platelets: 195 10*3/uL (ref 150.0–400.0)
RBC: 4.35 Mil/uL (ref 4.22–5.81)
RDW: 13.1 % (ref 11.5–15.5)
WBC: 6.3 10*3/uL (ref 4.0–10.5)

## 2020-11-20 LAB — HEPATIC FUNCTION PANEL
ALT: 23 U/L (ref 0–53)
AST: 20 U/L (ref 0–37)
Albumin: 4.2 g/dL (ref 3.5–5.2)
Alkaline Phosphatase: 56 U/L (ref 39–117)
Bilirubin, Direct: 0.1 mg/dL (ref 0.0–0.3)
Total Bilirubin: 0.7 mg/dL (ref 0.2–1.2)
Total Protein: 6.5 g/dL (ref 6.0–8.3)

## 2020-11-20 LAB — HEMOGLOBIN A1C: Hgb A1c MFr Bld: 6 % (ref 4.6–6.5)

## 2020-11-20 LAB — VITAMIN B12: Vitamin B-12: 591 pg/mL (ref 211–911)

## 2020-11-20 NOTE — Progress Notes (Signed)
Blood count  liver and kidney function normal and Vit B12  in good range . Blood sugar borderline elevated  but no diabetes,  Thyroid screen is slightly out of range : This  finding needs follow up. The level may be normal for age but you have increase risk of autoimmune  thyroid  condition.  Probably not causing you a symptom at this range but you may be a candidate for  thyroid  medication if ongoing,  Arrange to check TSH free T4 and thyroid peroxidase enzyme in  a month  dx abnormal thyroid test ( Samuel Pearson please place order)  no need to fast .   Continue b 12 supplement  life long.

## 2020-11-23 NOTE — Addendum Note (Signed)
Addended by: Nilda Riggs on: 11/23/2020 02:55 PM   Modules accepted: Orders

## 2020-11-26 ENCOUNTER — Other Ambulatory Visit: Payer: Self-pay | Admitting: Internal Medicine

## 2020-12-08 IMAGING — CT CT ABD-PELV W/ CM
2 of 5 series · 15 of 46 positions shown, 17 images · IV contrast (omnipaque)
Comparison: None.

CLINICAL DATA: Right lower quadrant abdominal pain, anorexia, and
nausea

EXAM:
CT ABDOMEN AND PELVIS WITH CONTRAST
TECHNIQUE: Multidetector CT imaging of the abdomen and pelvis was performed
using the standard protocol following bolus administration of
intravenous contrast.
CONTRAST:  100mL OMNIPAQUE IOHEXOL 300 MG/ML  SOLN

[Series 3: abdomen 5.0 · axial · 0.76mm/px · z∈[+788,+1168]mm · 12 of 88 slices shown, 14 images]
[im 6/88  soft-tissue]
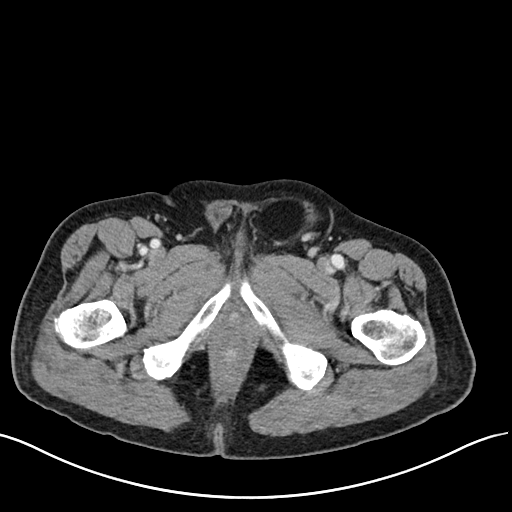
[im 6/88  bone]
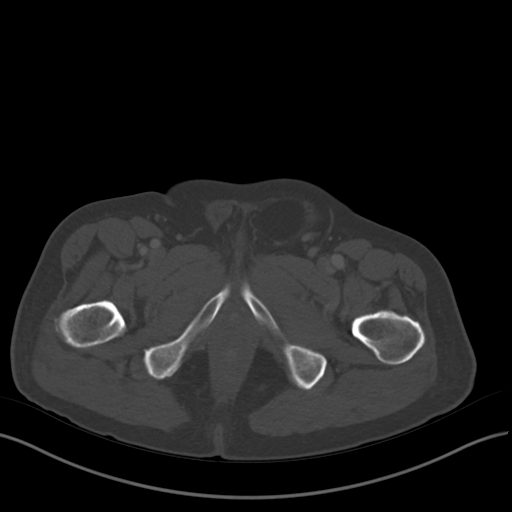
[im 16/88  soft-tissue]
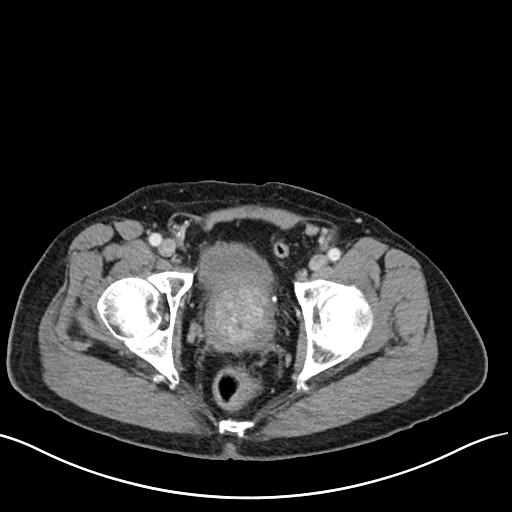
[im 21/88  soft-tissue]
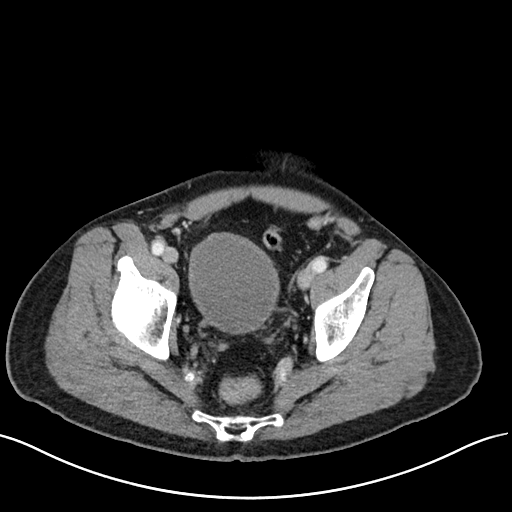
[im 26/88  soft-tissue]
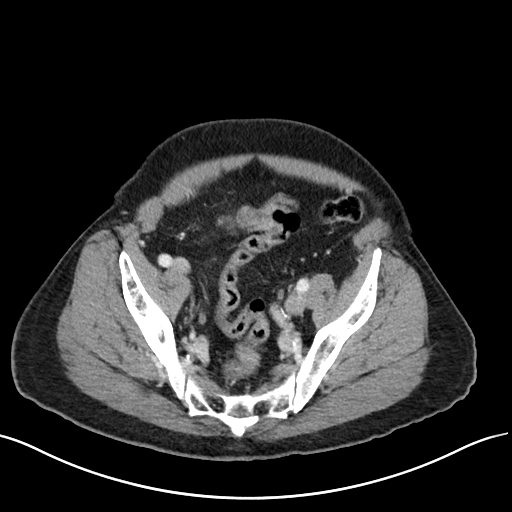
[im 36/88  soft-tissue]
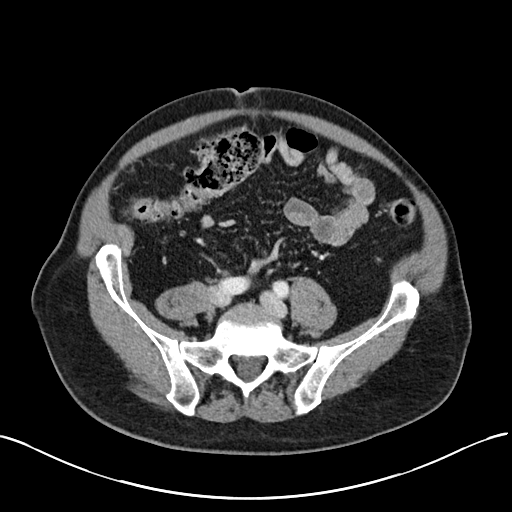
[im 41/88  soft-tissue]
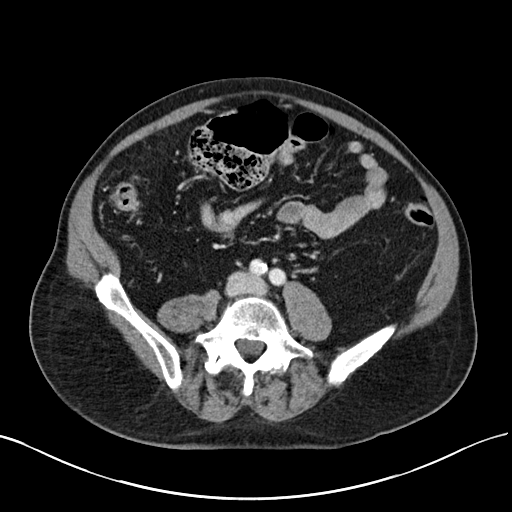
[im 47/88  soft-tissue]
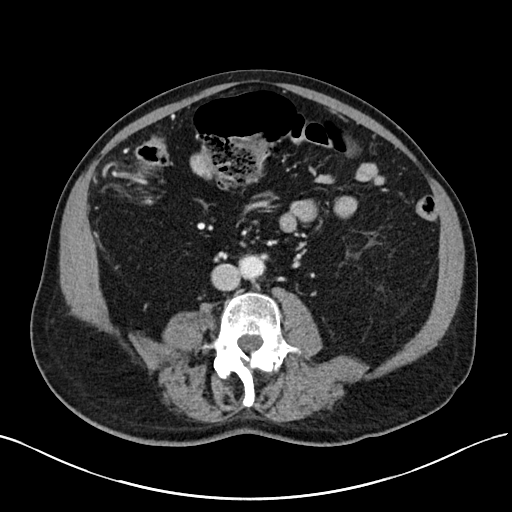
[im 57/88  soft-tissue]
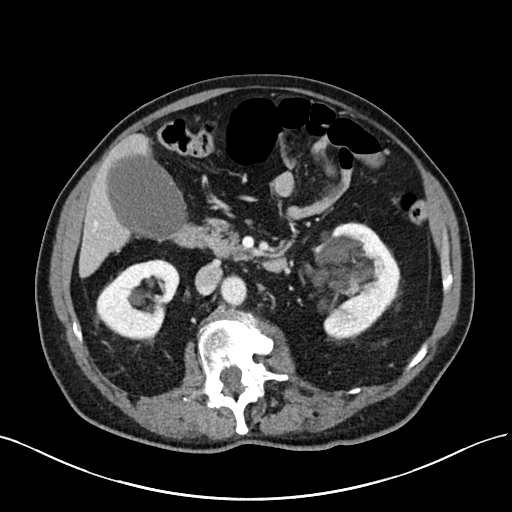
[im 62/88  soft-tissue]
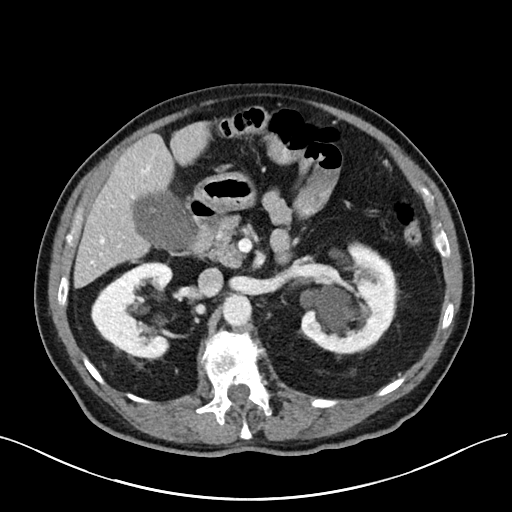
[im 62/88  bone]
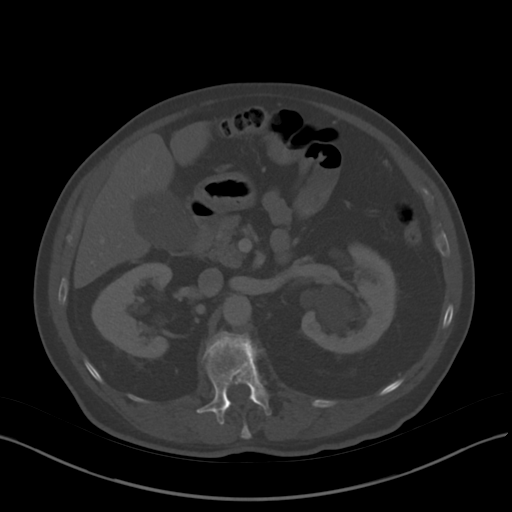
[im 67/88  soft-tissue]
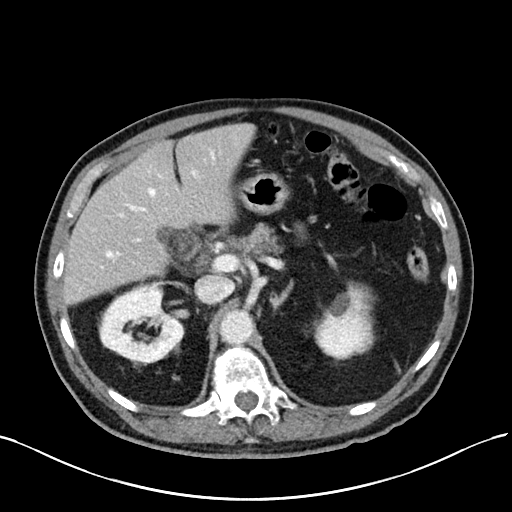
[im 77/88  soft-tissue]
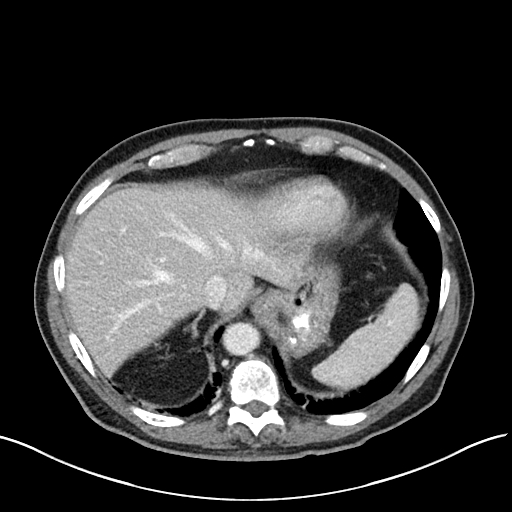
[im 82/88  soft-tissue]
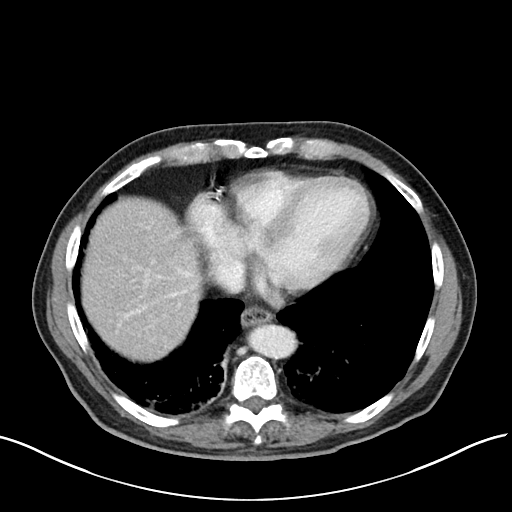

[Series 6: abdomen 3.0 mpr cor · coronal · 0.72mm/px · 3 of 100 slices shown]
[im 34/100  soft-tissue]
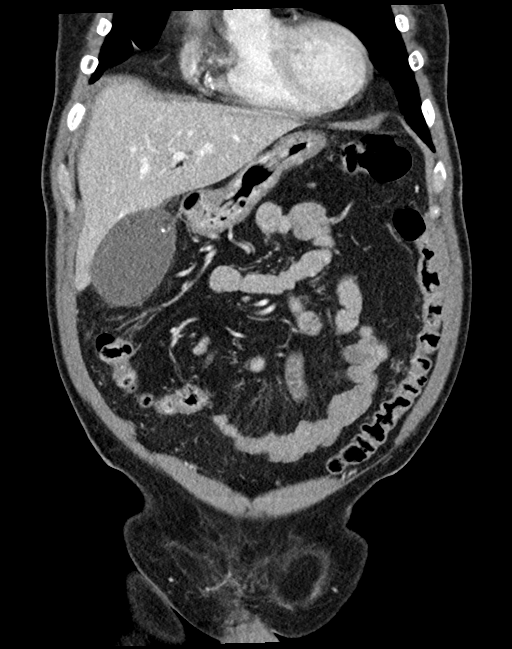
[im 45/100  soft-tissue]
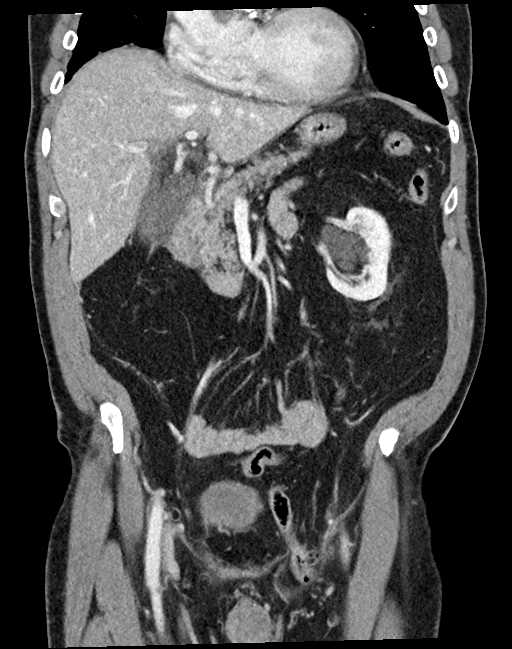
[im 56/100  soft-tissue]
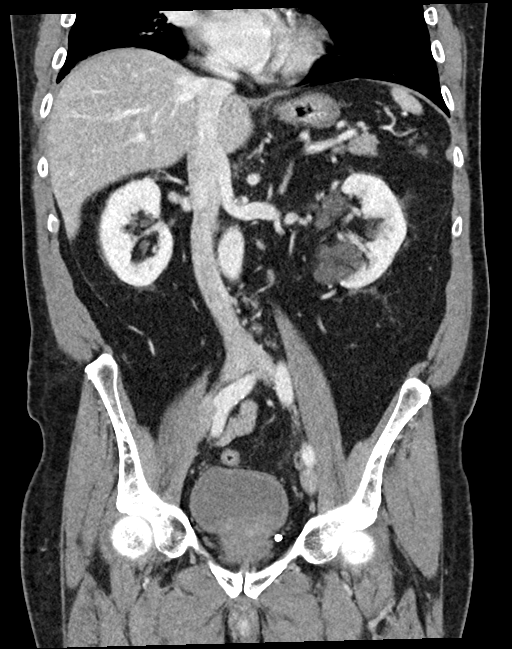

[15 of 46 positions shown; findings below may reference images not displayed]

FINDINGS: Lower chest: Patchy infiltration or atelectasis in the lung bases.
Mild cardiac enlargement. Small esophageal hiatal hernia.

Hepatobiliary: Distended gallbladder with mild gallbladder wall
thickening and small stone. Mild pericholecystic infiltration likely
representing acute cholecystitis. No bile duct dilatation. Mild
diffuse fatty infiltration of the liver. Small cysts.

Pancreas: Unremarkable. No pancreatic ductal dilatation or
surrounding inflammatory changes.

Spleen: Normal in size without focal abnormality.

Adrenals/Urinary Tract: No adrenal gland nodules. Bilateral
parapelvic renal cysts. Nephrograms are homogeneous and symmetrical.
No hydronephrosis. Bladder is normal.

Stomach/Bowel: Stomach, small bowel, and colon are not abnormally
distended. Scattered stool throughout the colon. Left inguinal
hernia containing a portion of the sigmoid colon. No proximal
obstruction. Appendix is normal.

Vascular/Lymphatic: Aortic atherosclerosis. No enlarged abdominal or
pelvic lymph nodes.

Reproductive: Prostate gland is diffusely enlarged.

Other: No free air or free fluid in the abdomen. Fat containing
hernias in the periumbilical and right groin region. Left inguinal
hernia contains colon as described above.

Musculoskeletal: Degenerative changes in the spine. No destructive
bone lesions. Lumbar scoliosis.
IMPRESSION: 1. Distended gallbladder with small stone and mild gallbladder wall
thickening and pericholecystic infiltration likely representing
acute cholecystitis.
2. Mild diffuse fatty infiltration of the liver.
3. Bilateral parapelvic renal cysts.
4. Left inguinal hernia containing a portion of the sigmoid colon.
No proximal obstruction.
5. Fat containing hernias in the periumbilical and right groin
region.
6. Prostate gland is diffusely enlarged.
7. Patchy infiltration or atelectasis in the lung bases.
8. Small esophageal hiatal hernia.
9. Aortic atherosclerosis.

Aortic Atherosclerosis ([id number]).

## 2020-12-17 ENCOUNTER — Other Ambulatory Visit: Payer: Self-pay | Admitting: Internal Medicine

## 2021-01-21 ENCOUNTER — Other Ambulatory Visit: Payer: Self-pay

## 2021-01-22 ENCOUNTER — Other Ambulatory Visit (INDEPENDENT_AMBULATORY_CARE_PROVIDER_SITE_OTHER): Payer: Medicare Other

## 2021-01-22 DIAGNOSIS — R946 Abnormal results of thyroid function studies: Secondary | ICD-10-CM | POA: Diagnosis not present

## 2021-01-22 DIAGNOSIS — E063 Autoimmune thyroiditis: Secondary | ICD-10-CM

## 2021-01-22 LAB — T4, FREE: Free T4: 0.78 ng/dL (ref 0.60–1.60)

## 2021-01-22 LAB — TSH: TSH: 5.36 u[IU]/mL (ref 0.35–5.50)

## 2021-01-24 NOTE — Progress Notes (Signed)
Thyroid tests now normal ( antibody test is pending) will follow    plan recheck  thyroid levels at next cpx or visit

## 2021-01-25 ENCOUNTER — Encounter: Payer: Self-pay | Admitting: Internal Medicine

## 2021-01-25 DIAGNOSIS — E063 Autoimmune thyroiditis: Secondary | ICD-10-CM | POA: Insufficient documentation

## 2021-01-25 LAB — THYROID PEROXIDASE ANTIBODY: Thyroperoxidase Ab SerPl-aCnc: 587 IU/mL — ABNORMAL HIGH (ref <9)

## 2021-01-25 NOTE — Progress Notes (Signed)
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 Close follow-up of your thyroid levels is indicated as planned.

## 2021-03-12 ENCOUNTER — Other Ambulatory Visit: Payer: Self-pay | Admitting: Internal Medicine

## 2021-05-13 ENCOUNTER — Telehealth: Payer: Self-pay | Admitting: Internal Medicine

## 2021-05-13 NOTE — Telephone Encounter (Signed)
Spoke to patient to schedule Medicare Annual Wellness Visit (AWV) either virtually or in office. He did not want to do this year and wanted a call in 2024   Last AWV 10/21/15 ; please schedule at anytime with [facility] or [facility]   This should be a 45 minute visit.

## 2021-06-29 ENCOUNTER — Other Ambulatory Visit: Payer: Self-pay | Admitting: Internal Medicine

## 2021-07-20 ENCOUNTER — Other Ambulatory Visit: Payer: Self-pay | Admitting: Internal Medicine

## 2021-09-24 ENCOUNTER — Other Ambulatory Visit: Payer: Self-pay | Admitting: Internal Medicine

## 2021-11-16 ENCOUNTER — Ambulatory Visit (INDEPENDENT_AMBULATORY_CARE_PROVIDER_SITE_OTHER): Payer: Medicare Other | Admitting: Family Medicine

## 2021-11-16 ENCOUNTER — Encounter: Payer: Self-pay | Admitting: Family Medicine

## 2021-11-16 VITALS — BP 146/60 | HR 90 | Temp 98.7°F | Wt 159.0 lb

## 2021-11-16 DIAGNOSIS — T7840XA Allergy, unspecified, initial encounter: Secondary | ICD-10-CM | POA: Diagnosis not present

## 2021-11-16 MED ORDER — METHYLPREDNISOLONE 4 MG PO TBPK: ORAL_TABLET | ORAL | 0 refills | Status: DC

## 2021-11-16 NOTE — Progress Notes (Signed)
   Subjective:    Patient ID: GILMORE LIST, male    DOB: 1945/12/20, 76 y.o.   MRN: [id number]  HPI Here for 4 days of redness and swelling and itching in both eyes and around both eyes. He was in [place] last weekend and he was exposed to very smoky air that has drifted [place] from the [employer]. This did not affect is breathing. He has been using Pataday eye drops with some success. His vision is normal.   Review of Systems  Constitutional: Negative.   Eyes:  Positive for redness and itching. Negative for photophobia, discharge and visual disturbance.  Respiratory: Negative.    Cardiovascular: Negative.        Objective:   Physical Exam Constitutional:      Appearance: Normal appearance.  Eyes:     Pupils: Pupils are equal, round, and reactive to light.     Comments: Both conjunctivae are clear. There is erythema and swelling above and below both eyes and in the eyelids   Neurological:     Mental Status: He is alert.           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 He will continue to use the Pataday drops, and we will add a Medrol dose pack. Recheck as needed.  Alysia Penna, MD

## 2021-11-25 ENCOUNTER — Telehealth: Payer: Self-pay | Admitting: Internal Medicine

## 2021-11-25 NOTE — Telephone Encounter (Signed)
Pt took 6 days of steroids given by Dr. Sarajane Jews.  The day after coming off the steroids the air quality level on 3 straight days was yellow, then red, then yellow again.  The patients eyes ore very red and inflammed again as it was when he saw Dr. Sarajane Jews.  He wanted to let Dr. Regis Bill know to see if he should get more steroids to clear it up.  Pt states the smoke in the air irritates his eyes.  Please call and advise pt.

## 2021-11-25 NOTE — Telephone Encounter (Signed)
Last Ov w/ Dr. Sarajane Jews 11/16/21 Please advise

## 2021-11-26 MED ORDER — PREDNISOLONE ACETATE 1 % OP SUSP: 2.0000 [drp] | Freq: Four times a day (QID) | OPHTHALMIC | 0 refills | Status: DC

## 2021-11-26 NOTE — Telephone Encounter (Signed)
Forwarding to Dr Sarajane Jews for advice  (usually  dont want to use sterods too long)

## 2021-11-26 NOTE — Telephone Encounter (Signed)
Instead of steroid pills, I sent in steroid eye drops he can use as needed

## 2021-11-26 NOTE — Addendum Note (Signed)
Addended by: Alysia Penna A on: 11/26/2021 01:19 PM   Modules accepted: Orders

## 2021-11-26 NOTE — Telephone Encounter (Signed)
Spoke with pt verbalized understanding of Dr Sarajane Jews recommendations, pt states that he is very impressed with Dr Sarajane Jews regarding his appointment with Dr Sarajane Jews

## 2021-11-30 ENCOUNTER — Encounter: Payer: Self-pay | Admitting: Family Medicine

## 2021-11-30 NOTE — Telephone Encounter (Signed)
Reduce this to 2 drops in the eyes BID for 2 days, then 2 drops once a day for 2 days, then stop

## 2021-12-07 ENCOUNTER — Telehealth: Payer: Self-pay | Admitting: Internal Medicine

## 2021-12-07 DIAGNOSIS — L309 Dermatitis, unspecified: Secondary | ICD-10-CM

## 2021-12-07 DIAGNOSIS — J309 Allergic rhinitis, unspecified: Secondary | ICD-10-CM

## 2021-12-07 MED ORDER — PREDNISONE 10 MG PO TABS: ORAL_TABLET | ORAL | 0 refills | Status: DC

## 2021-12-07 NOTE — Telephone Encounter (Signed)
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  Usually from an allergy to a contactant . We can refer to allergist but in interim avoid anything of chemical of any type on skin is best until resolving . Cool compresses  Stop all eye drops for now( can take an oral antihistamine if needed.) avoid rubbing eyes  ( could transfer irritant to eye from hands) .   Can clean with baby shampoo or Cetaphil ( what is used for people with eczema)    I will send in  a longer oral  prednisone taper ( like  I do poison ivy) but we don't want to keep using steroids  if can wean off)    I will do a allergy referral in interim .

## 2021-12-07 NOTE — Telephone Encounter (Signed)
Patient is aware. Patient reports he doesn't want to be on steroid as he was on before when saw Dr. Sarajane Jews. States taking the baby aspirin has help and the benadryl. Patient states he feels there is no info shared between with his PCP and from his visit with Dr. Sarajane Jews. Patient is aware that the allergist location will contact him.

## 2021-12-07 NOTE — Telephone Encounter (Signed)
Message sent my chart

## 2021-12-07 NOTE — Addendum Note (Signed)
Addended byBurnis Medin on: 12/07/2021 04:37 PM   Modules accepted: Orders

## 2021-12-07 NOTE — Telephone Encounter (Signed)
States she spoke with provider yesterday and showed a pic of his eyes and they have gotten worse requesting info for an allergist.

## 2021-12-09 NOTE — Telephone Encounter (Signed)
Please make sure patient sees message on my chart that I sent 2 days ago

## 2021-12-10 NOTE — Telephone Encounter (Signed)
Pt notified that he has a lengthy mychart message that Dr Regis Bill sent & encouraged to read it & call back or respond with questions or concerns. Pt verb understanding.
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
## 2021-12-13 ENCOUNTER — Ambulatory Visit: Payer: Medicare Other | Admitting: Allergy

## 2021-12-13 ENCOUNTER — Other Ambulatory Visit: Payer: Self-pay | Admitting: Internal Medicine

## 2021-12-13 ENCOUNTER — Encounter: Payer: Self-pay | Admitting: Allergy

## 2021-12-13 VITALS — BP 140/64 | HR 87 | Temp 97.8°F | Ht 62.0 in | Wt 161.4 lb

## 2021-12-13 DIAGNOSIS — K9049 Malabsorption due to intolerance, not elsewhere classified: Secondary | ICD-10-CM

## 2021-12-13 DIAGNOSIS — J3089 Other allergic rhinitis: Secondary | ICD-10-CM

## 2021-12-13 DIAGNOSIS — L309 Dermatitis, unspecified: Secondary | ICD-10-CM | POA: Insufficient documentation

## 2021-12-13 MED ORDER — DESONIDE 0.05 % EX OINT: 1.0000 | TOPICAL_OINTMENT | Freq: Two times a day (BID) | CUTANEOUS | 1 refills | Status: DC | PRN

## 2021-12-13 NOTE — Patient Instructions (Addendum)
Today's skin testing showed: Positive to tree pollen and dust mites.   Results given.  Periorbital dermatitis Use desonide 0.05% ointment twice a day as needed for mild rash flares - okay to use on the face, neck, groin area. Do not use more than 1 week at a time. Make sure to avoid the eye ball. See below for proper skin care.  Make sure you are using fragrance free and dye free laundry detergent. No dryer sheets or fabric softener. Stop using all eye drops for now.  Once the rash around the eyes clear up then okay to restart the rewetting eye drops. Do not use pataday as it will worsen the dry eyes.  If no improvement then recommend patch testing next. Patches are best placed on Monday with return to office on Wednesday and Friday of same week for readings.  Patches once placed should not get wet.  You do not have to stop any medications for patch testing but should not be on oral prednisone. You can schedule a patch testing visit when convenient for your schedule.    True Test looks for the following sensitivities:       Environmental allergies Start environmental control measures as below. Use over the counter antihistamines such as Zyrtec (cetirizine), Claritin (loratadine), or Xyzal (levocetirizine) daily as needed. May take twice a day during allergy flares. May switch antihistamines every few months. Use Nasacort (triamcinolone) nasal spray 1 spray per nostril twice a day as needed for nasal congestion.   Food Continue to avoid foods that are bothersome.  Follow up if needed.  Our [place] office is moving in September 2023 to a new location. New address: [address] [place], [place], [place] [address] (white building). [place] office: [phone] (same phone number).   Skin care recommendations  Bath time: Always use lukewarm water. AVOID very hot or cold water. Keep bathing time to 5-10 minutes. Do NOT use bubble bath. Use a mild soap and use just enough to wash the  dirty areas. Do NOT scrub skin vigorously.  After bathing, pat dry your skin with a towel. Do NOT rub or scrub the skin.  Moisturizers and prescriptions:  ALWAYS apply moisturizers immediately after bathing (within 3 minutes). This helps to lock-in moisture. Use the moisturizer several times a day over the whole body. Good summer moisturizers include: Aveeno, CeraVe, Cetaphil. Good winter moisturizers include: Aquaphor, Vaseline, Cerave, Cetaphil, Eucerin, Vanicream. When using moisturizers along with medications, the moisturizer should be applied about one hour after applying the medication to prevent diluting effect of the medication or moisturize around where you applied the medications. When not using medications, the moisturizer can be continued twice daily as maintenance.  Laundry and clothing: Avoid laundry products with added color or perfumes. Use unscented hypo-allergenic laundry products such as Tide free, Cheer free & gentle, and All free and clear.  If the skin still seems dry or sensitive, you can try double-rinsing the clothes. Avoid tight or scratchy clothing such as wool. Do not use fabric softeners or dyer sheets.   Reducing Pollen Exposure Pollen seasons: trees (spring), grass (summer) and ragweed/weeds (fall). Keep windows closed in your home and car to lower pollen exposure.  Install air conditioning in the bedroom and throughout the house if possible.  Avoid going out in dry windy days - especially early morning. Pollen counts are highest between 5 - 10 AM and on dry, hot and windy days.  Save outside activities for late afternoon or after a heavy rain,  when pollen levels are lower.  Avoid mowing of grass if you have grass pollen allergy. Be aware that pollen can also be transported indoors on people and pets.  Dry your clothes in an automatic dryer rather than hanging them outside where they might collect pollen.  Rinse hair and eyes before bedtime.  Control of  House Dust Mite Allergen Dust mite allergens are a common trigger of allergy and asthma symptoms. While they can be found throughout the house, these microscopic creatures thrive in warm, humid environments such as bedding, upholstered furniture and carpeting. Because so much time is spent in the bedroom, it is essential to reduce mite levels there.  Encase pillows, mattresses, and box springs in special allergen-proof fabric covers or airtight, zippered plastic covers.  Bedding should be washed weekly in hot water (130 F) and dried in a hot dryer. Allergen-proof covers are available for comforters and pillows that can't be regularly washed.  Wash the allergy-proof covers every few months. Minimize clutter in the bedroom. Keep pets out of the bedroom.  Keep humidity less than 50% by using a dehumidifier or air conditioning. You can buy a humidity measuring device called a hygrometer to monitor this.  If possible, replace carpets with hardwood, linoleum, or washable area rugs. If that's not possible, vacuum frequently with a vacuum that has a HEPA filter. Remove all upholstered furniture and non-washable window drapes from the bedroom. Remove all non-washable stuffed toys from the bedroom.  Wash stuffed toys weekly.
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
## 2021-12-13 NOTE — Assessment & Plan Note (Signed)
Nasal congestion. No prior testing. Allegra ineffective.  Today's skin prick testing showed: Positive to tree pollen and dust mites.   Start environmental control measures as below.  Use over the counter antihistamines such as Zyrtec (cetirizine), Claritin (loratadine), or Xyzal (levocetirizine) daily as needed. May take twice a day during allergy flares. May switch antihistamines every few months.  Use Nasacort (triamcinolone) nasal spray 1 spray per nostril twice a day as needed for nasal congestion.

## 2021-12-13 NOTE — Assessment & Plan Note (Signed)
Garlic and cinnamon causes GI symptoms. . Continue to avoid foods that are bothersome.

## 2022-01-12 ENCOUNTER — Other Ambulatory Visit: Payer: Self-pay | Admitting: Internal Medicine

## 2022-01-18 ENCOUNTER — Ambulatory Visit (INDEPENDENT_AMBULATORY_CARE_PROVIDER_SITE_OTHER): Payer: Medicare Other | Admitting: Internal Medicine

## 2022-01-18 ENCOUNTER — Encounter: Payer: Self-pay | Admitting: Internal Medicine

## 2022-01-18 VITALS — BP 184/54 | HR 64 | Temp 97.8°F | Ht 61.0 in | Wt 159.6 lb

## 2022-01-18 DIAGNOSIS — Z79899 Other long term (current) drug therapy: Secondary | ICD-10-CM

## 2022-01-18 DIAGNOSIS — R7301 Impaired fasting glucose: Secondary | ICD-10-CM | POA: Diagnosis not present

## 2022-01-18 DIAGNOSIS — E063 Autoimmune thyroiditis: Secondary | ICD-10-CM | POA: Diagnosis not present

## 2022-01-18 DIAGNOSIS — I10 Essential (primary) hypertension: Secondary | ICD-10-CM | POA: Diagnosis not present

## 2022-01-18 DIAGNOSIS — Z0000 Encounter for general adult medical examination without abnormal findings: Secondary | ICD-10-CM

## 2022-01-18 DIAGNOSIS — D510 Vitamin B12 deficiency anemia due to intrinsic factor deficiency: Secondary | ICD-10-CM

## 2022-01-18 DIAGNOSIS — I351 Nonrheumatic aortic (valve) insufficiency: Secondary | ICD-10-CM | POA: Diagnosis not present

## 2022-01-18 LAB — CBC WITH DIFFERENTIAL/PLATELET
Basophils Absolute: 0 10*3/uL (ref 0.0–0.1)
Basophils Relative: 0.3 % (ref 0.0–3.0)
Eosinophils Absolute: 0.2 10*3/uL (ref 0.0–0.7)
Eosinophils Relative: 2.6 % (ref 0.0–5.0)
HCT: 43.2 % (ref 39.0–52.0)
Hemoglobin: 14.5 g/dL (ref 13.0–17.0)
Lymphocytes Relative: 16.7 % (ref 12.0–46.0)
Lymphs Abs: 1.2 10*3/uL (ref 0.7–4.0)
MCHC: 33.6 g/dL (ref 30.0–36.0)
MCV: 97.5 fl (ref 78.0–100.0)
Monocytes Absolute: 1 10*3/uL (ref 0.1–1.0)
Monocytes Relative: 14.6 % — ABNORMAL HIGH (ref 3.0–12.0)
Neutro Abs: 4.7 10*3/uL (ref 1.4–7.7)
Neutrophils Relative %: 65.8 % (ref 43.0–77.0)
Platelets: 216 10*3/uL (ref 150.0–400.0)
RBC: 4.43 Mil/uL (ref 4.22–5.81)
RDW: 13 % (ref 11.5–15.5)
WBC: 7.1 10*3/uL (ref 4.0–10.5)

## 2022-01-18 LAB — BASIC METABOLIC PANEL
BUN: 19 mg/dL (ref 6–23)
CO2: 28 mEq/L (ref 19–32)
Calcium: 9.7 mg/dL (ref 8.4–10.5)
Chloride: 102 mEq/L (ref 96–112)
Creatinine, Ser: 0.84 mg/dL (ref 0.40–1.50)
GFR: 84.04 mL/min (ref >60.00)
Glucose, Bld: 105 mg/dL — ABNORMAL HIGH (ref 70–99)
Potassium: 4.5 mEq/L (ref 3.5–5.1)
Sodium: 139 mEq/L (ref 135–145)

## 2022-01-18 LAB — LIPID PANEL
Cholesterol: 190 mg/dL (ref 0–200)
HDL: 43.7 mg/dL (ref >39.00)
LDL Cholesterol: 116 mg/dL — ABNORMAL HIGH (ref 0–99)
NonHDL: 146.66
Total CHOL/HDL Ratio: 4
Triglycerides: 153 mg/dL — ABNORMAL HIGH (ref 0.0–149.0)
VLDL: 30.6 mg/dL (ref 0.0–40.0)

## 2022-01-18 LAB — HEPATIC FUNCTION PANEL
ALT: 21 U/L (ref 0–53)
AST: 21 U/L (ref 0–37)
Albumin: 4.3 g/dL (ref 3.5–5.2)
Alkaline Phosphatase: 58 U/L (ref 39–117)
Bilirubin, Direct: 0.1 mg/dL (ref 0.0–0.3)
Total Bilirubin: 0.6 mg/dL (ref 0.2–1.2)
Total Protein: 7.5 g/dL (ref 6.0–8.3)

## 2022-01-18 LAB — TSH: TSH: 3.96 u[IU]/mL (ref 0.35–5.50)

## 2022-01-18 LAB — VITAMIN B12: Vitamin B-12: >1500 pg/mL — ABNORMAL HIGH (ref 211–911)

## 2022-01-18 LAB — T4, FREE: Free T4: 0.8 ng/dL (ref 0.60–1.60)

## 2022-01-18 LAB — HEMOGLOBIN A1C: Hgb A1c MFr Bld: 6 % (ref 4.6–6.5)

## 2022-01-18 NOTE — Progress Notes (Signed)
Chief Complaint  Patient presents with   Annual Exam    HPI: Patient  Samuel Pearson  76 y.o. comes in today for Preventive Health Care visit and medication evaluation. BP:    was 140 range  in past.  On lisinopril daily and metoprolol 25 twice daily but did not not take medication this morning. B12 2500 daily sublingual   Trying  total beets per day to help with blood pressure.  Has some fatigue but no cardiovascular symptoms walks dogs no change in exercise tolerance. Health Maintenance  Topic Date Due   Zoster Vaccines- Shingrix (1 of 2) Never done   COVID-19 Vaccine (4 - Pfizer risk series) 06/02/2020   INFLUENZA VACCINE  08/07/2022 (Originally 12/07/2021)   TETANUS/TDAP  03/19/2023   Pneumonia Vaccine 39+ Years old  Completed   Hepatitis C Screening  Completed   HPV VACCINES  Aged Out   COLONOSCOPY (Pts 45-4yr Insurance coverage will need to be confirmed)  Discontinued   Health Maintenance Review LIFESTYLE:  Exercise:  walk dogs  every day  Tobacco/ETS: Alcohol: every evening    Sugar beverages: n Sleep: abou 8-9  Drug use: no HH of  2  2 grown dogs and then puppy.    ROS:   r hip area thigh.   Pain at night takes a baby aspirin and in a.m. is better able to walk without difficulty Eyes are better   REST of 12 system review negative except as per HPI   Past Medical History:  Diagnosis Date   Allergic rhinitis    Anxiety    Detached retina    Heart murmur    has had one when a child and checked out ok  never? 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  elevated MCV  No other sx     Past Surgical History:  Procedure Laterality Date   CATARACT EXTRACTION  2009   [facility]   CHOLECYSTECTOMY N/A 02/25/2020   Procedure: LAPAROSCOPIC CHOLECYSTECTOMY;  Surgeon: CErroll Luna MD;  Location: [facility];  Service:  General;  Laterality: N/A;    Family History  Problem Relation Age of Onset   Arthritis Mother    Hypertension Mother    Arthritis Father    Hypertension Father     Social History   Socioeconomic History   Marital status: Married    Spouse name: Not on file   Number of children: Not on file   Years of education: Not on file   Highest education level: Not on file  Occupational History   Not on file  Tobacco Use   Smoking status: Never   Smokeless tobacco: Never  Vaping Use   Vaping Use: Never used  Substance and Sexual Activity   Alcohol use: Yes    Alcohol/week: 14.0 - 21.0 standard drinks of alcohol    Types: 14 - 21 Cans of beer per week   Drug use: No   Sexual activity: Not on file  Other Topics Concern   Not on file  Social History Narrative   [place]   Married   Some Caffeine   Helps take care of grandkids   recently moved residence    Social Determinants of HRadio broadcast assistantStrain: Not  on file  Food Insecurity: Not on file  Transportation Needs: Not on file  Physical Activity: Not on file  Stress: Not on file  Social Connections: Not on file    Outpatient Medications Prior to Visit  Medication Sig Dispense Refill   Cyanocobalamin (VITAMIN B-12) 1000 MCG SUBL Place 2 tablets (2,000 mcg total) under the tongue daily. 180 tablet 3   diphenhydrAMINE (BENADRYL) 25 MG tablet Take 25 mg by mouth every 6 (six) hours as needed.     lisinopril (ZESTRIL) 20 MG tablet TAKE 1 TABLET BY MOUTH EVERY DAY 30 tablet 0   metoprolol tartrate (LOPRESSOR) 25 MG tablet TAKE 1 TABLET BY MOUTH TWICE A DAY 180 tablet 2   prednisoLONE acetate (PRED FORTE) 1 % ophthalmic suspension Place 2 drops into both eyes 4 (four) times daily. 10 mL 0   chlorpheniramine (CHLOR-TRIMETON) 4 MG tablet Take 4 mg by mouth 2 (two) times daily as needed for allergies. (Patient not taking: Reported on 01/18/2022)     desonide (DESOWEN) 0.05 %  ointment Apply 1 Application topically 2 (two) times daily as needed (mild rash flare). Okay to use on the face, neck, groin area. Do not use more than 1 week at a time. (Patient not taking: Reported on 01/18/2022) 15 g 1   niacin 500 MG tablet Take by mouth. (Patient not taking: Reported on 01/18/2022)     No facility-administered medications prior to visit.     EXAM:  BP (!) 184/54 (BP Location: Right Arm, Cuff Size: Normal)   Pulse 64   Temp 97.8 F (36.6 C) (Oral)   Ht '5\' 1"'$  (1.549 m)   Wt 159 lb 9.6 oz (72.4 kg)   SpO2 96%   BMI 30.16 kg/m   Body mass index is 30.16 kg/m. Wt Readings from Last 3 Encounters:  01/18/22 159 lb 9.6 oz (72.4 kg)  12/13/21 161 lb 6 oz (73.2 kg)  11/16/21 159 lb (72.1 kg)    Physical Exam: Vital signs reviewed XBW:IOMB is a well-developed well-nourished alert cooperative    who appearsr stated age in no acute distress.  HEENT: normocephalic atraumatic , Eyes: PERRL EOM's full, conjunctiva clear, Nares: paten,t no deformity discharge or tenderness., Ears: no deformity EAC's clear TMs with normal landmarks. Mouth: clear OP, no lesions, edema.  Moist mucous membranes. Dentition in adequate repair. NECK: supple without masses, thyromegaly or bruits. CHEST/PULM:  Clear to auscultation and percussion breath sounds equal no wheeze , rales or rhonchi. No chest wall deformities or tenderness. CV: PMI is nondisplaced, S1 S2 no gallops, bounding pulses 2/6 to 3/6 diastolic murmur right upper chest rubs. Peripheral pulses are full without delay.No JVD .  ABDOMEN: Bowel sounds normal nontender  No guard or rebound, no hepato splenomegal no CVA tenderness.   Extremtities:  No clubbing cyanosis or edema, no acute joint swelling or redness no focal atrophy NEURO:  Oriented x3, cranial nerves 3-12 appear to be intact, no obvious focal weakness,gait within normal limits no abnormal reflexes or asymmetrical SKIN: No acute rashes normal turgor, color, no bruising or  petechiae. PSYCH: Oriented, good eye contact, no obvious depression anxiety, cognition and judgment appear normal. LN: no cervical axillary adenopathy  Lab Results  Component Value Date   WBC 7.1 01/18/2022   HGB 14.5 01/18/2022   HCT 43.2 01/18/2022   PLT 216.0 01/18/2022   GLUCOSE 105 (H) 01/18/2022   CHOL 190 01/18/2022   TRIG 153.0 (H) 01/18/2022   HDL 43.70 01/18/2022   LDLCALC 116 (  H) 01/18/2022   ALT 21 01/18/2022   AST 21 01/18/2022   NA 139 01/18/2022   K 4.5 01/18/2022   CL 102 01/18/2022   CREATININE 0.84 01/18/2022   BUN 19 01/18/2022   CO2 28 01/18/2022   TSH 3.96 01/18/2022   PSA 1.90 10/14/2015   HGBA1C 6.0 01/18/2022    BP Readings from Last 3 Encounters:  01/18/22 (!) 184/54  12/13/21 (!) 140/64  11/16/21 (!) 146/60   Lab Results  Component Value Date   VITAMINB12 >1500 (H) 01/18/2022    Lab results reviewed with patient   ASSESSMENT AND PLAN:  Discussed the following assessment and plan:    ICD-10-CM   1. Visit for preventive health examination  Z00.00     2. Essential hypertension  Z61 Basic metabolic panel    CBC with Differential/Platelet    Hemoglobin A1c    Hepatic function panel    Lipid panel    TSH    T4, free    Vitamin W96    Basic metabolic panel    CBC with Differential/Platelet    Hemoglobin A1c    Hepatic function panel    Lipid panel    TSH    T4, free    Vitamin B12   very high today see notes text     3. 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 Medication management  J19.147 Basic metabolic panel    CBC with Differential/Platelet    Hemoglobin A1c    Hepatic function panel    Lipid panel    TSH    T4,  free    Vitamin W29    Basic metabolic panel    CBC with Differential/Platelet    Hemoglobin A1c    Hepatic function panel    Lipid panel    TSH    T4, free    Vitamin B12    5. PA (pernicious anemia)  F62.1 Basic metabolic panel    CBC with Differential/Platelet    Hemoglobin A1c    Hepatic function panel    Lipid panel    TSH    T4, free    Vitamin H08    Basic metabolic panel    CBC with Differential/Platelet    Hemoglobin A1c    Hepatic function panel    Lipid panel    TSH    T4, free    Vitamin B12    6. Fasting hyperglycemia  M57.84 Basic metabolic panel    CBC with Differential/Platelet    Hemoglobin A1c    Hepatic function panel    Lipid panel    TSH    T4, free    Vitamin O96    Basic metabolic panel    CBC with Differential/Platelet    Hemoglobin A1c    Hepatic function panel    Lipid panel    TSH    T4, free    Vitamin B12    7. Aortic valve insufficiency, etiology of cardiac valve disease unspecified  E95.2 Basic metabolic  panel    CBC with Differential/Platelet    Hemoglobin A1c    Hepatic function panel    Lipid panel    TSH    T4, free    Vitamin S28    Basic metabolic panel    CBC with Differential/Platelet    Hemoglobin A1c    Hepatic function panel    Lipid panel    TSH    T4, free    Vitamin B12    R hip  sx   st and  no alam findings fu if  Pressure very high today uncertain if not taking medicine consider alteration or changes Specially with AI etc. amlodipine instead of Lopressor (avoid diuretics as had side effect with tachycardia )  Plan close follow-up with blood pressure to send in 5 days of readings twice daily back on his medicine Duration medicine and go from there. Return for depending on results and bp readings.  Patient Care Team: Burnis Medin, MD as PCP - [facility], Lucita Ferrara, [place] (Dermatology) Patient Instructions  Good to see you today . Get a new BP monitor  Considering to change bp meds   ( adding  amlodipine and weaning metoprolol)  Restart the  bp meds when you get home  today ( can get rebound high bp readings is stop the beta blocker   suddenly)  Take blood pressure readings twice a day for 5 days and then periodically .To ensure below 140/90   .Send in readings      Lab today .   Plan depending on  bp control  and labs       Horace K. Kristen Bushway M.D.

## 2022-01-18 NOTE — Patient Instructions (Addendum)
Good to see you today . Get a new BP monitor  Considering to change bp meds  ( adding  amlodipine and weaning metoprolol)  Restart the  bp meds when you get home  today ( can get rebound high bp readings is stop the beta blocker   suddenly)  Take blood pressure readings twice a day for 5 days and then periodically .To ensure below 140/90   .Send in readings      Lab today .   Plan depending on  bp control  and labs

## 2022-01-18 NOTE — Assessment & Plan Note (Signed)
Still no sx

## 2022-01-31 NOTE — Progress Notes (Signed)
Lab work is stable or in range except B12 level is now high blood sugar A1c in the prediabetic range but no diabetes.  Thyroid in range.  Blood pressure results that you dropped off seem to be mostly in range Wait a few weeks and send in another couple days of readings.  Twice a day for 3 to 5 days

## 2022-02-02 ENCOUNTER — Encounter: Payer: Self-pay | Admitting: Internal Medicine

## 2022-02-09 ENCOUNTER — Ambulatory Visit (INDEPENDENT_AMBULATORY_CARE_PROVIDER_SITE_OTHER): Payer: Medicare Other | Admitting: Orthopaedic Surgery

## 2022-02-09 ENCOUNTER — Encounter: Payer: Self-pay | Admitting: Orthopaedic Surgery

## 2022-02-09 ENCOUNTER — Ambulatory Visit (INDEPENDENT_AMBULATORY_CARE_PROVIDER_SITE_OTHER): Payer: Medicare Other

## 2022-02-09 DIAGNOSIS — M5441 Lumbago with sciatica, right side: Secondary | ICD-10-CM | POA: Diagnosis not present

## 2022-02-09 DIAGNOSIS — M25551 Pain in right hip: Secondary | ICD-10-CM

## 2022-02-09 NOTE — Progress Notes (Signed)
The patient is a very pleasant 76 year old gentleman who is actually very active.  He has been dealing with right hip pain for couple weeks now.  He does describe being on steroids after some irritation to his eyes and inflammation after being on a trip to [place].  This started after the steroids.  He has been taking a baby aspirin (81 mg (daily and that is actually helped him quite a bit.  He denies any groin pain.  He does get numbness and tingling down his right lateral leg.  That is been going on and off for some time now.  He says every morning he does get pain on the lateral aspect of his right hip and does have the sensation going down his leg.  On exam both hips move smoothly and fluidly.  His range of motion is entirely full.  He has just some mild pain over the trochanteric area of his right hip.  He does have subjective numbness in the lateral aspect of his right leg.  An AP and lateral lumbar spine shows severe mid lumbar degenerative changes.  There is degenerative scoliosis and significant degenerative disc disease.  An AP pelvis and lateral the right hip shows normal-appearing hips bilaterally.  There is no cortical irregularity around the trochanteric area either.  I did show him some stretching exercises to try.  Offered him a steroid injection but he said the pain is not bad enough and he will continue 81 mg aspirin which I think is safe for him to do since it is just once a day.  He said he will try the stretching exercises as well.  My next step for him would be considering outpatient physical therapy for his spine and his hip.  All question concerns were answered and addressed.  Follow-up canals as needed

## 2022-02-14 ENCOUNTER — Other Ambulatory Visit: Payer: Self-pay | Admitting: Internal Medicine

## 2022-02-23 ENCOUNTER — Ambulatory Visit: Payer: Medicare Other | Admitting: Orthopaedic Surgery

## 2022-03-15 ENCOUNTER — Other Ambulatory Visit: Payer: Self-pay

## 2022-03-15 ENCOUNTER — Encounter: Payer: Self-pay | Admitting: Allergy and Immunology

## 2022-03-15 ENCOUNTER — Ambulatory Visit (INDEPENDENT_AMBULATORY_CARE_PROVIDER_SITE_OTHER): Payer: Medicare Other | Admitting: Allergy and Immunology

## 2022-03-15 VITALS — BP 128/68 | HR 90 | Temp 99.2°F | Resp 18 | Ht 61.0 in | Wt 164.8 lb

## 2022-03-15 DIAGNOSIS — L0100 Impetigo, unspecified: Secondary | ICD-10-CM

## 2022-03-15 DIAGNOSIS — L230 Allergic contact dermatitis due to metals: Secondary | ICD-10-CM | POA: Diagnosis not present

## 2022-03-15 DIAGNOSIS — L989 Disorder of the skin and subcutaneous tissue, unspecified: Secondary | ICD-10-CM

## 2022-03-15 DIAGNOSIS — H01009 Unspecified blepharitis unspecified eye, unspecified eyelid: Secondary | ICD-10-CM

## 2022-03-15 DIAGNOSIS — H10539 Contact blepharoconjunctivitis, unspecified eye: Secondary | ICD-10-CM

## 2022-03-15 DIAGNOSIS — L253 Unspecified contact dermatitis due to other chemical products: Secondary | ICD-10-CM | POA: Diagnosis not present

## 2022-03-15 NOTE — Patient Instructions (Signed)
  1.  True test patch test applied today.  Return in 48 hours for repeat  2.  Treat inflammation in and around the eye:   A.  Elidel (generic) -apply twice a day around the eye  B.  TobraDex (generic) -1 drop each eye 1 time per day for 5 days only  3.  Treat chronic infection of eye:   A.  Doxycycline 100 mg - 1 tablet twice a day for 10 days only  4.  Eliminate use of oral B12 for now until patch test results are back.  5.  If possible cobalt allergy will require injectable B12.

## 2022-03-15 NOTE — Progress Notes (Unsigned)
[facility] [place] [facility] [place]   Follow-up Note  Referring Provider: Burnis Medin, MD Primary Provider: Burnis Medin, MD Date of Office Visit: 03/15/2022  Subjective:   Samuel Pearson (DOB: [birth year]) is a 76 y.o. 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  No therapy to date other than systemic steroids has helped his dermatitis.  When he takes prednisone he does get improvement but not complete resolution but it quickly comes back within a few weeks.  It sounds as though he has been treated with some topical steroids and some over-the-counter moisturizers which do not help this issue at all.  He has been given Pataday and Systane which may help this issue minimally.  It should be noted that he started B12 supplementation approximately 1 year ago.  Allergies as of 03/15/2022       Reactions   Pollen Extract-tree Extract [pollen Extract]         Medication List    chlorpheniramine 4 MG tablet Commonly known as: CHLOR-TRIMETON Take 4 mg by mouth 2 (two) times daily as needed for allergies.   desonide 0.05 % ointment Commonly known as: DESOWEN Apply 1 Application topically 2 (two) times daily as needed (mild rash flare). Okay to use on the face, neck, groin area. Do not use more than 1 week at a time.   diphenhydrAMINE 25 MG tablet Commonly known as: BENADRYL Take 25 mg by mouth every 6 (six) hours as needed.   lisinopril 20 MG tablet Commonly known as: ZESTRIL TAKE 1 TABLET BY MOUTH EVERY DAY   metoprolol tartrate 25 MG tablet Commonly known as: LOPRESSOR TAKE 1 TABLET BY MOUTH TWICE A DAY   niacin 500 MG tablet Commonly known as: VITAMIN B3 Take by mouth.   prednisoLONE acetate 1 % ophthalmic suspension Commonly known as: PRED FORTE Place 2 drops  into both eyes 4 (four) times daily.   Vitamin B-12 1000 MCG Subl Place 2 tablets (2,000 mcg total) under the tongue daily.    Past Medical History:  Diagnosis Date   Allergic rhinitis    Anxiety    Detached retina    Heart murmur    has had one when a child and checked out ok  never? 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  elevated MCV  No other sx     Past Surgical History:  Procedure Laterality Date   CATARACT EXTRACTION  2009   [facility]   CHOLECYSTECTOMY N/A 02/25/2020   Procedure: LAPAROSCOPIC CHOLECYSTECTOMY;  Surgeon: Erroll Luna, MD;  Location: [facility];  Service: General;  Laterality: N/A;    Review of systems negative except as noted in HPI / PMHx or noted below:  Review of Systems  Constitutional: Negative.   HENT: Negative.    Eyes: Negative.   Respiratory: Negative.    Cardiovascular: Negative.   Gastrointestinal: Negative.   Genitourinary: Negative.   Musculoskeletal: Negative.   Skin: Negative.   Neurological: Negative.   Endo/Heme/Allergies: Negative.   Psychiatric/Behavioral: Negative.       Objective:   Vitals:   03/15/22 1433  BP: 128/68  Pulse: 90  Resp: 18  Temp: 99.2 F (37.3 C)  SpO2: 95%   Height: '5\' 1"'$  (154.9 cm)  Weight: 164 lb 12.8 oz (74.8 kg)   Physical Exam Skin:    Findings: Rash (Extensive erythematous indurated peri orbital region with skin scale) present.     Diagnostics: True test patch test placed today  Assessment and Plan:   1. Allergic contact dermatitis due to metals   2. Contact dermatitis due to chemicals   3. Inflammatory dermatosis   4. Allergic contact blepharoconjunctivitis   5. Blepharitis with impetigo, unspecified laterality    1.  True test patch test applied today.  Return in 48 hours for repeat  2.  Treat inflammation in and around the  eye:   A.  Elidel (generic) -apply twice a day around the eye  B.  TobraDex (generic) -1 drop each eye 1 time per day for 5 days only  3.  Treat chronic infection of eye:   A.  Doxycycline 100 mg - 1 tablet twice a day for 10 days only  4.  Eliminate use of oral B12 for now until patch test results are back.  5.  If possible cobalt allergy will require injectable B12.  Unfortunately, I suspect that Kalief may have developed a cobalt allergy directed against the cobalt located in the center of the B12 molecule.  We will confirm that issue with the patch test today and also to examine for other possible contact allergens.  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  Allena Katz, MD [facility]

## 2022-03-16 ENCOUNTER — Other Ambulatory Visit: Payer: Self-pay | Admitting: Allergy and Immunology

## 2022-03-16 ENCOUNTER — Telehealth: Payer: Self-pay | Admitting: Allergy and Immunology

## 2022-03-16 ENCOUNTER — Encounter: Payer: Self-pay | Admitting: Allergy and Immunology

## 2022-03-16 MED ORDER — PIMECROLIMUS 1 % EX CREA: TOPICAL_CREAM | CUTANEOUS | 2 refills | Status: DC

## 2022-03-16 MED ORDER — DOXYCYCLINE MONOHYDRATE 100 MG PO TABS: 100.0000 mg | ORAL_TABLET | Freq: Two times a day (BID) | ORAL | 0 refills | Status: AC

## 2022-03-16 MED ORDER — TOBRAMYCIN-DEXAMETHASONE 0.3-0.1 % OP SUSP: OPHTHALMIC | 0 refills | Status: DC

## 2022-03-16 NOTE — Telephone Encounter (Signed)
Patient called and said he went to pharmacy 2 times to get his rx but nothing was there. He needs the Eldel and TobraDex and [facility]cycline [facility] [address]. [phone].

## 2022-03-16 NOTE — Telephone Encounter (Signed)
Medications sent to requested pharmacy and patient informed

## 2022-03-16 NOTE — Progress Notes (Unsigned)
    Follow-up Note  RE: Samuel Pearson MRN: [id number] DOB: 1946/04/08 Date of Office Visit: 03/17/2022  Primary care provider: Burnis Medin, MD Referring provider: Burnis Medin, MD   Samuel Pearson returns to the office today for the initial patch test interpretation, given suspected history of contact dermatitis. He reports that he has not had any itching on his back. He reports trouble getting the medications from his last visit and has used tobradex eye drops once and taken doxycycline twice since his last visit. He did not get Elidel due to cost.    Diagnostics:   TRUE TEST 48-hour hour reading: negative to the panel   Plan:   Allergic contact dermatitis Negative to the panel at today's reading  Thank you for the opportunity to care for this patient.  Please do not hesitate to contact me with questions.  Gareth Morgan, FNP [facility]

## 2022-03-17 ENCOUNTER — Other Ambulatory Visit: Payer: Self-pay | Admitting: Family Medicine

## 2022-03-17 ENCOUNTER — Encounter: Payer: Self-pay | Admitting: Family Medicine

## 2022-03-17 ENCOUNTER — Ambulatory Visit: Payer: Medicare Other | Admitting: Family Medicine

## 2022-03-17 DIAGNOSIS — L253 Unspecified contact dermatitis due to other chemical products: Secondary | ICD-10-CM | POA: Insufficient documentation

## 2022-03-17 MED ORDER — PIMECROLIMUS 1 % EX CREA: TOPICAL_CREAM | CUTANEOUS | 2 refills | Status: DC

## 2022-03-17 MED ORDER — TACROLIMUS 0.1 % EX OINT: TOPICAL_OINTMENT | Freq: Two times a day (BID) | CUTANEOUS | 0 refills | Status: DC

## 2022-03-17 NOTE — Telephone Encounter (Signed)
Thank you :)

## 2022-03-17 NOTE — Telephone Encounter (Signed)
Can you please let this patient know that the alternative medication was not covered by his insurance. Would advise him to get the elidel. I found a good RX coupon for 72.28 at [employer]. Thank you

## 2022-03-17 NOTE — Telephone Encounter (Signed)
Patient stated that he picked the medication out of pocket at [facility] with the good RX coupon. He stated " To Hell with the insurance company because they will not cover any of the creams. However, I am very impressed with the doctor here. He is fantastic."

## 2022-03-17 NOTE — Patient Instructions (Signed)
Diagnostics:   TRUE TEST 48-hour hour reading: negative to the panel   Plan:   Allergic contact dermatitis Negative to the panel at today's reading Continue Doxycycline and Tobradex Begin Protopic around your eyes twice a day. If this is more expensive than Elidel call the clinic  Call the clinic if this treatment plan is not working well for you  Follow up in 5 days or sooner if needed.

## 2022-03-22 ENCOUNTER — Ambulatory Visit: Payer: Medicare Other | Admitting: Allergy and Immunology

## 2022-03-22 DIAGNOSIS — L253 Unspecified contact dermatitis due to other chemical products: Secondary | ICD-10-CM

## 2022-03-22 NOTE — Progress Notes (Unsigned)
Samuel Pearson this clinic in evaluation of contact dermatitis.  He is here today for his 7-day patch test read.  Evaluation of his patch test placement areas does not identify any hypersensitivity.  He has undergone 5 days of therapy to date with doxycycline, topical conjunctival tobramycin/dexamethasone, and Elidel application and elimination of his oral B12.  He is dramatically improved with this therapy.  I will see him back in this clinic after 4 weeks of therapy.

## 2022-03-23 ENCOUNTER — Encounter: Payer: Self-pay | Admitting: Allergy and Immunology

## 2022-03-28 ENCOUNTER — Telehealth: Payer: Self-pay | Admitting: Allergy and Immunology

## 2022-03-28 MED ORDER — DOXYCYCLINE MONOHYDRATE 100 MG PO TABS: 100.0000 mg | ORAL_TABLET | Freq: Two times a day (BID) | ORAL | 0 refills | Status: AC

## 2022-03-28 NOTE — Telephone Encounter (Signed)
Spoke with patient, informed him of Dr. Bruna Potter recommendation. Patient verbalized understanding.

## 2022-03-28 NOTE — Telephone Encounter (Signed)
Samuel Pearson came in with a note and the note states as follows:  I was so pleased with a clean panel test and your "greatly improved" evaluation on 03/22/2022.  Unfortunately, I then got a flu vaccine and my dermatitis returned the next day (red, itchy) eyes.  I have continued Doxycycline mono '100mg'$  and pimecrolimus 1% cream.  Swelling slightly better but NOT gone, itching worse during sleep.    Last Doxycycline tablet this afternoon.  NO REFILLS REMAINING.  My next visit with you is Dec 14th with you.  Recovery Advice?  [facility] [address].   [name] states he is concerned he no longer has refills on Doxycycline.  Please advise.

## 2022-04-21 ENCOUNTER — Ambulatory Visit: Payer: Medicare Other | Admitting: Allergy and Immunology

## 2022-04-27 ENCOUNTER — Telehealth: Payer: Self-pay | Admitting: Internal Medicine

## 2022-04-27 NOTE — Telephone Encounter (Signed)
Spoke with patient to schedule AWV  Patient wanted to let you know, he [facility]  He stated he was not allergic to anything expect blooming trees.  He stated provider said he had staff infection and was given antibiotic.  He took this for 2 weeks went off of med.  Staff infection came back and took another 2 weeks of antibiotics.

## 2022-05-09 DIAGNOSIS — R0981 Nasal congestion: Secondary | ICD-10-CM | POA: Diagnosis not present

## 2022-05-09 DIAGNOSIS — H6123 Impacted cerumen, bilateral: Secondary | ICD-10-CM | POA: Diagnosis not present

## 2022-05-09 DIAGNOSIS — U071 COVID-19: Secondary | ICD-10-CM | POA: Diagnosis not present

## 2022-05-09 DIAGNOSIS — R051 Acute cough: Secondary | ICD-10-CM | POA: Diagnosis not present

## 2022-05-10 NOTE — Telephone Encounter (Signed)
Pt states after this last round of abx infection has resolved. Pt has been diagnosed with covid x 2 days; not taking antiviral. Pt states he was diagnosed at [facility].   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

## 2022-06-06 ENCOUNTER — Telehealth: Payer: Self-pay | Admitting: Internal Medicine

## 2022-06-06 NOTE — Telephone Encounter (Signed)
Spoke with patient to schedule AWV.  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

## 2022-07-25 ENCOUNTER — Other Ambulatory Visit: Payer: Self-pay | Admitting: Internal Medicine

## 2022-08-10 ENCOUNTER — Other Ambulatory Visit: Payer: Self-pay | Admitting: Internal Medicine

## 2022-09-12 ENCOUNTER — Telehealth: Payer: Self-pay | Admitting: Internal Medicine

## 2022-09-12 NOTE — Telephone Encounter (Signed)
Contacted Samuel Pearson to schedule their annual wellness visit. Patient declined to schedule AWV at this time.   Do not call  Samuel Pearson AWV direct phone # [phone]   Spoke with patients spouse  she stated they were very healthy and declined to scheduel

## 2022-11-08 ENCOUNTER — Inpatient Hospital Stay (HOSPITAL_COMMUNITY): Payer: Medicare Other

## 2022-11-08 ENCOUNTER — Emergency Department (HOSPITAL_COMMUNITY): Payer: Medicare Other

## 2022-11-08 ENCOUNTER — Encounter (HOSPITAL_COMMUNITY): Payer: Self-pay

## 2022-11-08 ENCOUNTER — Other Ambulatory Visit: Payer: Self-pay

## 2022-11-08 ENCOUNTER — Inpatient Hospital Stay (HOSPITAL_COMMUNITY)
Admission: EM | Admit: 2022-11-08 | Discharge: 2022-11-14 | DRG: 040 | Disposition: A | Discharge status: Rehabilitation Facility | Payer: Medicare Other | Attending: Student | Admitting: Student

## 2022-11-08 DIAGNOSIS — I63431 Cerebral infarction due to embolism of right posterior cerebral artery: Secondary | ICD-10-CM | POA: Diagnosis not present

## 2022-11-08 DIAGNOSIS — K567 Ileus, unspecified: Secondary | ICD-10-CM | POA: Diagnosis not present

## 2022-11-08 DIAGNOSIS — K6389 Other specified diseases of intestine: Secondary | ICD-10-CM | POA: Diagnosis not present

## 2022-11-08 DIAGNOSIS — L270 Generalized skin eruption due to drugs and medicaments taken internally: Secondary | ICD-10-CM | POA: Diagnosis not present

## 2022-11-08 DIAGNOSIS — M21372 Foot drop, left foot: Secondary | ICD-10-CM | POA: Diagnosis not present

## 2022-11-08 DIAGNOSIS — R29706 NIHSS score 6: Secondary | ICD-10-CM | POA: Diagnosis not present

## 2022-11-08 DIAGNOSIS — R531 Weakness: Secondary | ICD-10-CM | POA: Diagnosis not present

## 2022-11-08 DIAGNOSIS — Z79899 Other long term (current) drug therapy: Secondary | ICD-10-CM

## 2022-11-08 DIAGNOSIS — G8194 Hemiplegia, unspecified affecting left nondominant side: Secondary | ICD-10-CM | POA: Diagnosis not present

## 2022-11-08 DIAGNOSIS — G9341 Metabolic encephalopathy: Secondary | ICD-10-CM | POA: Diagnosis not present

## 2022-11-08 DIAGNOSIS — F10139 Alcohol abuse with withdrawal, unspecified: Secondary | ICD-10-CM | POA: Diagnosis present

## 2022-11-08 DIAGNOSIS — Z7982 Long term (current) use of aspirin: Secondary | ICD-10-CM

## 2022-11-08 DIAGNOSIS — E039 Hypothyroidism, unspecified: Secondary | ICD-10-CM | POA: Diagnosis not present

## 2022-11-08 DIAGNOSIS — Z743 Need for continuous supervision: Secondary | ICD-10-CM | POA: Diagnosis not present

## 2022-11-08 DIAGNOSIS — I6389 Other cerebral infarction: Secondary | ICD-10-CM

## 2022-11-08 DIAGNOSIS — F419 Anxiety disorder, unspecified: Secondary | ICD-10-CM | POA: Diagnosis present

## 2022-11-08 DIAGNOSIS — I69359 Hemiplegia and hemiparesis following cerebral infarction affecting unspecified side: Secondary | ICD-10-CM | POA: Diagnosis not present

## 2022-11-08 DIAGNOSIS — D696 Thrombocytopenia, unspecified: Secondary | ICD-10-CM | POA: Diagnosis present

## 2022-11-08 DIAGNOSIS — Z789 Other specified health status: Secondary | ICD-10-CM | POA: Diagnosis not present

## 2022-11-08 DIAGNOSIS — G992 Myelopathy in diseases classified elsewhere: Secondary | ICD-10-CM | POA: Diagnosis not present

## 2022-11-08 DIAGNOSIS — M48061 Spinal stenosis, lumbar region without neurogenic claudication: Secondary | ICD-10-CM | POA: Diagnosis present

## 2022-11-08 DIAGNOSIS — M47812 Spondylosis without myelopathy or radiculopathy, cervical region: Secondary | ICD-10-CM | POA: Diagnosis not present

## 2022-11-08 DIAGNOSIS — I63529 Cerebral infarction due to unspecified occlusion or stenosis of unspecified anterior cerebral artery: Secondary | ICD-10-CM | POA: Diagnosis not present

## 2022-11-08 DIAGNOSIS — Z8249 Family history of ischemic heart disease and other diseases of the circulatory system: Secondary | ICD-10-CM

## 2022-11-08 DIAGNOSIS — E876 Hypokalemia: Secondary | ICD-10-CM | POA: Diagnosis not present

## 2022-11-08 DIAGNOSIS — E119 Type 2 diabetes mellitus without complications: Secondary | ICD-10-CM | POA: Diagnosis present

## 2022-11-08 DIAGNOSIS — D519 Vitamin B12 deficiency anemia, unspecified: Secondary | ICD-10-CM | POA: Diagnosis present

## 2022-11-08 DIAGNOSIS — R140 Abdominal distension (gaseous): Secondary | ICD-10-CM | POA: Diagnosis not present

## 2022-11-08 DIAGNOSIS — R569 Unspecified convulsions: Secondary | ICD-10-CM | POA: Diagnosis not present

## 2022-11-08 DIAGNOSIS — I82452 Acute embolism and thrombosis of left peroneal vein: Secondary | ICD-10-CM | POA: Diagnosis not present

## 2022-11-08 DIAGNOSIS — I672 Cerebral atherosclerosis: Secondary | ICD-10-CM | POA: Diagnosis not present

## 2022-11-08 DIAGNOSIS — I639 Cerebral infarction, unspecified: Secondary | ICD-10-CM | POA: Diagnosis not present

## 2022-11-08 DIAGNOSIS — Z8261 Family history of arthritis: Secondary | ICD-10-CM

## 2022-11-08 DIAGNOSIS — I10 Essential (primary) hypertension: Secondary | ICD-10-CM | POA: Diagnosis present

## 2022-11-08 DIAGNOSIS — E785 Hyperlipidemia, unspecified: Secondary | ICD-10-CM | POA: Diagnosis not present

## 2022-11-08 DIAGNOSIS — Z85828 Personal history of other malignant neoplasm of skin: Secondary | ICD-10-CM | POA: Diagnosis not present

## 2022-11-08 DIAGNOSIS — J309 Allergic rhinitis, unspecified: Secondary | ICD-10-CM | POA: Diagnosis not present

## 2022-11-08 DIAGNOSIS — M4802 Spinal stenosis, cervical region: Secondary | ICD-10-CM | POA: Diagnosis not present

## 2022-11-08 DIAGNOSIS — R4701 Aphasia: Secondary | ICD-10-CM | POA: Diagnosis not present

## 2022-11-08 DIAGNOSIS — D539 Nutritional anemia, unspecified: Secondary | ICD-10-CM | POA: Diagnosis present

## 2022-11-08 DIAGNOSIS — K9189 Other postprocedural complications and disorders of digestive system: Secondary | ICD-10-CM | POA: Diagnosis not present

## 2022-11-08 DIAGNOSIS — I6782 Cerebral ischemia: Secondary | ICD-10-CM | POA: Diagnosis not present

## 2022-11-08 DIAGNOSIS — W19XXXA Unspecified fall, initial encounter: Secondary | ICD-10-CM | POA: Diagnosis not present

## 2022-11-08 DIAGNOSIS — R21 Rash and other nonspecific skin eruption: Secondary | ICD-10-CM | POA: Diagnosis not present

## 2022-11-08 DIAGNOSIS — M4800 Spinal stenosis, site unspecified: Secondary | ICD-10-CM | POA: Diagnosis not present

## 2022-11-08 DIAGNOSIS — M549 Dorsalgia, unspecified: Secondary | ICD-10-CM | POA: Diagnosis not present

## 2022-11-08 DIAGNOSIS — G9589 Other specified diseases of spinal cord: Secondary | ICD-10-CM | POA: Diagnosis not present

## 2022-11-08 DIAGNOSIS — D510 Vitamin B12 deficiency anemia due to intrinsic factor deficiency: Secondary | ICD-10-CM | POA: Diagnosis not present

## 2022-11-08 DIAGNOSIS — I63421 Cerebral infarction due to embolism of right anterior cerebral artery: Secondary | ICD-10-CM | POA: Diagnosis not present

## 2022-11-08 DIAGNOSIS — I69354 Hemiplegia and hemiparesis following cerebral infarction affecting left non-dominant side: Secondary | ICD-10-CM | POA: Diagnosis not present

## 2022-11-08 DIAGNOSIS — R7303 Prediabetes: Secondary | ICD-10-CM | POA: Diagnosis not present

## 2022-11-08 DIAGNOSIS — Z91018 Allergy to other foods: Secondary | ICD-10-CM

## 2022-11-08 DIAGNOSIS — F1010 Alcohol abuse, uncomplicated: Secondary | ICD-10-CM | POA: Diagnosis not present

## 2022-11-08 DIAGNOSIS — I517 Cardiomegaly: Secondary | ICD-10-CM | POA: Diagnosis not present

## 2022-11-08 DIAGNOSIS — I6523 Occlusion and stenosis of bilateral carotid arteries: Secondary | ICD-10-CM | POA: Diagnosis not present

## 2022-11-08 DIAGNOSIS — K560 Paralytic ileus: Secondary | ICD-10-CM | POA: Diagnosis not present

## 2022-11-08 DIAGNOSIS — I63521 Cerebral infarction due to unspecified occlusion or stenosis of right anterior cerebral artery: Secondary | ICD-10-CM | POA: Diagnosis not present

## 2022-11-08 DIAGNOSIS — I6300 Cerebral infarction due to thrombosis of unspecified precerebral artery: Secondary | ICD-10-CM | POA: Diagnosis not present

## 2022-11-08 DIAGNOSIS — R29818 Other symptoms and signs involving the nervous system: Secondary | ICD-10-CM | POA: Diagnosis not present

## 2022-11-08 DIAGNOSIS — E041 Nontoxic single thyroid nodule: Secondary | ICD-10-CM | POA: Diagnosis not present

## 2022-11-08 DIAGNOSIS — Z9049 Acquired absence of other specified parts of digestive tract: Secondary | ICD-10-CM | POA: Diagnosis not present

## 2022-11-08 DIAGNOSIS — M5021 Other cervical disc displacement,  high cervical region: Secondary | ICD-10-CM | POA: Diagnosis not present

## 2022-11-08 LAB — CBC
HCT: 32.7 % — ABNORMAL LOW (ref 39.0–52.0)
Hemoglobin: 10.6 g/dL — ABNORMAL LOW (ref 13.0–17.0)
MCH: 33.7 pg (ref 26.0–34.0)
MCHC: 32.4 g/dL (ref 30.0–36.0)
MCV: 103.8 fL — ABNORMAL HIGH (ref 80.0–100.0)
Platelets: 141 10*3/uL — ABNORMAL LOW (ref 150–400)
RBC: 3.15 MIL/uL — ABNORMAL LOW (ref 4.22–5.81)
RDW: 12.6 % (ref 11.5–15.5)
WBC: 8.3 10*3/uL (ref 4.0–10.5)
nRBC: 0 % (ref 0.0–0.2)

## 2022-11-08 LAB — DIFFERENTIAL
Abs Immature Granulocytes: 0.03 10*3/uL (ref 0.00–0.07)
Basophils Absolute: 0 10*3/uL (ref 0.0–0.1)
Basophils Relative: 0 %
Eosinophils Absolute: 0 10*3/uL (ref 0.0–0.5)
Eosinophils Relative: 0 %
Immature Granulocytes: 0 %
Lymphocytes Relative: 8 %
Lymphs Abs: 0.7 10*3/uL (ref 0.7–4.0)
Monocytes Absolute: 0.8 10*3/uL (ref 0.1–1.0)
Monocytes Relative: 9 %
Neutro Abs: 6.8 10*3/uL (ref 1.7–7.7)
Neutrophils Relative %: 83 %

## 2022-11-08 LAB — CBG MONITORING, ED: Glucose-Capillary: 79 mg/dL (ref 70–99)

## 2022-11-08 LAB — LIPID PANEL
Cholesterol: 183 mg/dL (ref 0–200)
HDL: 39 mg/dL — ABNORMAL LOW (ref >40)
LDL Cholesterol: 130 mg/dL — ABNORMAL HIGH (ref 0–99)
Total CHOL/HDL Ratio: 4.7 RATIO
Triglycerides: 70 mg/dL (ref <150)
VLDL: 14 mg/dL (ref 0–40)

## 2022-11-08 LAB — ECHOCARDIOGRAM COMPLETE
AR max vel: 2.45 cm2
AV Area VTI: 2.52 cm2
AV Area mean vel: 2.52 cm2
AV Mean grad: 8 mmHg
AV Peak grad: 16.8 mmHg
Ao pk vel: 2.05 m/s
Height: 66 in
S' Lateral: 3.5 cm
Weight: 2560 oz

## 2022-11-08 LAB — COMPREHENSIVE METABOLIC PANEL
ALT: 20 U/L (ref 0–44)
AST: 29 U/L (ref 15–41)
Albumin: 3.5 g/dL (ref 3.5–5.0)
Alkaline Phosphatase: 47 U/L (ref 38–126)
Anion gap: 11 (ref 5–15)
BUN: 13 mg/dL (ref 8–23)
CO2: 17 mmol/L — ABNORMAL LOW (ref 22–32)
Calcium: 8 mg/dL — ABNORMAL LOW (ref 8.9–10.3)
Chloride: 110 mmol/L (ref 98–111)
Creatinine, Ser: 0.79 mg/dL (ref 0.61–1.24)
GFR, Estimated: >60 mL/min (ref >60)
Glucose, Bld: 114 mg/dL — ABNORMAL HIGH (ref 70–99)
Potassium: 3.9 mmol/L (ref 3.5–5.1)
Sodium: 138 mmol/L (ref 135–145)
Total Bilirubin: 1 mg/dL (ref 0.3–1.2)
Total Protein: 6 g/dL — ABNORMAL LOW (ref 6.5–8.1)

## 2022-11-08 LAB — I-STAT CHEM 8, ED
BUN: 12 mg/dL (ref 8–23)
Calcium, Ion: 0.85 mmol/L — CL (ref 1.15–1.40)
Chloride: 115 mmol/L — ABNORMAL HIGH (ref 98–111)
Creatinine, Ser: 0.4 mg/dL — ABNORMAL LOW (ref 0.61–1.24)
Glucose, Bld: 91 mg/dL (ref 70–99)
HCT: 28 % — ABNORMAL LOW (ref 39.0–52.0)
Hemoglobin: 9.5 g/dL — ABNORMAL LOW (ref 13.0–17.0)
Potassium: 2.8 mmol/L — ABNORMAL LOW (ref 3.5–5.1)
Sodium: 147 mmol/L — ABNORMAL HIGH (ref 135–145)
TCO2: 16 mmol/L — ABNORMAL LOW (ref 22–32)

## 2022-11-08 LAB — VITAMIN B12: Vitamin B-12: 204 pg/mL (ref 180–914)

## 2022-11-08 LAB — BASIC METABOLIC PANEL
Anion gap: 10 (ref 5–15)
BUN: 12 mg/dL (ref 8–23)
CO2: 20 mmol/L — ABNORMAL LOW (ref 22–32)
Calcium: 8.5 mg/dL — ABNORMAL LOW (ref 8.9–10.3)
Chloride: 106 mmol/L (ref 98–111)
Creatinine, Ser: 0.77 mg/dL (ref 0.61–1.24)
GFR, Estimated: >60 mL/min (ref >60)
Glucose, Bld: 130 mg/dL — ABNORMAL HIGH (ref 70–99)
Potassium: 3.7 mmol/L (ref 3.5–5.1)
Sodium: 136 mmol/L (ref 135–145)

## 2022-11-08 LAB — PROTIME-INR
INR: 1.2 (ref 0.8–1.2)
Prothrombin Time: 15.2 seconds (ref 11.4–15.2)

## 2022-11-08 LAB — POC OCCULT BLOOD, ED: Fecal Occult Bld: NEGATIVE

## 2022-11-08 LAB — HEMOGLOBIN A1C
Hgb A1c MFr Bld: 5.7 % — ABNORMAL HIGH (ref 4.8–5.6)
Mean Plasma Glucose: 116.89 mg/dL

## 2022-11-08 LAB — ETHANOL: Alcohol, Ethyl (B): <10 mg/dL (ref <10)

## 2022-11-08 LAB — APTT: aPTT: 27 seconds (ref 24–36)

## 2022-11-08 LAB — CK: Total CK: 606 U/L — ABNORMAL HIGH (ref 49–397)

## 2022-11-08 LAB — TSH: TSH: 5.929 u[IU]/mL — ABNORMAL HIGH (ref 0.350–4.500)

## 2022-11-08 MED ORDER — ACETAMINOPHEN 160 MG/5ML PO SOLN: 650.0000 mg | ORAL | Status: DC | PRN

## 2022-11-08 MED ORDER — THIAMINE MONONITRATE 100 MG PO TABS
100.0000 mg | ORAL_TABLET | Freq: Every day | ORAL | Status: DC
  Administered 2022-11-08 – 2022-11-14 (×7): 100 mg via ORAL
  Filled 2022-11-08 (×7): qty 1

## 2022-11-08 MED ORDER — ADULT MULTIVITAMIN W/MINERALS CH
1.0000 | ORAL_TABLET | Freq: Every day | ORAL | Status: DC
  Administered 2022-11-08 – 2022-11-14 (×7): 1 via ORAL
  Filled 2022-11-08 (×7): qty 1

## 2022-11-08 MED ORDER — FOLIC ACID 1 MG PO TABS
1.0000 mg | ORAL_TABLET | Freq: Every day | ORAL | Status: DC
  Administered 2022-11-08: 1 mg via ORAL
  Filled 2022-11-08 (×2): qty 1

## 2022-11-08 MED ORDER — SODIUM CHLORIDE 0.9 % IV SOLN: INTRAVENOUS | Status: DC

## 2022-11-08 MED ORDER — LORAZEPAM 2 MG/ML IJ SOLN: 0.0000 mg | Freq: Two times a day (BID) | INTRAMUSCULAR | Status: DC

## 2022-11-08 MED ORDER — LORAZEPAM 1 MG PO TABS
2.0000 mg | ORAL_TABLET | Freq: Once | ORAL | Status: AC
  Administered 2022-11-08: 2 mg via ORAL
  Filled 2022-11-08: qty 2

## 2022-11-08 MED ORDER — STROKE: EARLY STAGES OF RECOVERY BOOK
Freq: Once | Status: AC
  Filled 2022-11-08: qty 1

## 2022-11-08 MED ORDER — SODIUM CHLORIDE 0.9% FLUSH
3.0000 mL | Freq: Once | INTRAVENOUS | Status: AC
  Administered 2022-11-08: 3 mL via INTRAVENOUS

## 2022-11-08 MED ORDER — POLYETHYL GLYCOL-PROPYL GLYCOL 0.4-0.3 % OP GEL: 1.0000 | Freq: Every day | OPHTHALMIC | Status: DC | PRN

## 2022-11-08 MED ORDER — POLYVINYL ALCOHOL 1.4 % OP SOLN
1.0000 [drp] | OPHTHALMIC | Status: DC | PRN
  Filled 2022-11-08: qty 15

## 2022-11-08 MED ORDER — LORAZEPAM 2 MG/ML IJ SOLN
0.0000 mg | Freq: Four times a day (QID) | INTRAMUSCULAR | Status: DC
  Administered 2022-11-08 – 2022-11-09 (×2): 1 mg via INTRAVENOUS
  Filled 2022-11-08 (×2): qty 1

## 2022-11-08 MED ORDER — LORAZEPAM 2 MG/ML IJ SOLN: 1.0000 mg | INTRAMUSCULAR | Status: DC | PRN

## 2022-11-08 MED ORDER — ACETAMINOPHEN 325 MG PO TABS
650.0000 mg | ORAL_TABLET | ORAL | Status: DC | PRN
  Administered 2022-11-13: 650 mg via ORAL
  Filled 2022-11-08: qty 2

## 2022-11-08 MED ORDER — LACTATED RINGERS IV BOLUS
1000.0000 mL | Freq: Once | INTRAVENOUS | Status: AC
  Administered 2022-11-08: 1000 mL via INTRAVENOUS

## 2022-11-08 MED ORDER — LORAZEPAM 2 MG/ML IJ SOLN
1.0000 mg | Freq: Once | INTRAMUSCULAR | Status: AC
  Administered 2022-11-08: 1 mg via INTRAVENOUS

## 2022-11-08 MED ORDER — THIAMINE HCL 100 MG/ML IJ SOLN
100.0000 mg | Freq: Every day | INTRAMUSCULAR | Status: DC
  Filled 2022-11-08 (×2): qty 2

## 2022-11-08 MED ORDER — LORAZEPAM 2 MG/ML IJ SOLN
0.5000 mg | Freq: Once | INTRAMUSCULAR | Status: AC
  Administered 2022-11-08: 0.5 mg via INTRAVENOUS
  Filled 2022-11-08: qty 1

## 2022-11-08 MED ORDER — CALCIUM GLUCONATE-NACL 2-0.675 GM/100ML-% IV SOLN
2.0000 g | Freq: Once | INTRAVENOUS | Status: AC
  Administered 2022-11-08: 2000 mg via INTRAVENOUS
  Filled 2022-11-08 (×2): qty 100

## 2022-11-08 MED ORDER — ACETAMINOPHEN 650 MG RE SUPP: 650.0000 mg | RECTAL | Status: DC | PRN

## 2022-11-08 MED ORDER — LORAZEPAM 2 MG/ML IJ SOLN
0.5000 mg | Freq: Once | INTRAMUSCULAR | Status: DC
  Filled 2022-11-08: qty 1

## 2022-11-08 MED ORDER — LORAZEPAM 1 MG PO TABS: 1.0000 mg | ORAL_TABLET | ORAL | Status: DC | PRN

## 2022-11-08 NOTE — ED Triage Notes (Signed)
PT BIB EMS for a code stroke, woke up and ambulated to the restroom, complained of back pain to his wife, fell, had loss of bowels, and was unable to get up and had left sided weakness.  Patient reports extensive back issues.   Patient is AOX4, able to answer questions and verbalize needs.

## 2022-11-08 NOTE — Code Documentation (Signed)
Stroke Response Nurse Documentation Code Documentation  Samuel Pearson is a 77 y.o. male arriving to [facility]  via [facility] on 11/08/2022 with past medical hx of HTN and back pain. Code stroke was activated by EMS.   Patient from home where he was LKW at 2100 last night when he went to bed and now complaining of sudden back pain and left leg weakness when he woke up this morning. Pt reports he woke up due to back pain this morning. He pressure washed all yesterday and reports going to bed at baseline. This morning after the back pain woke him up, the patient reported feeling like his left leg was weak. He walked himself to the bathroom and then fell and could not get off the tile floor. Reports being incontinent of bowel. Upon arrival to the ED, left leg weakness noted.   Stroke team at the bedside on patient arrival. Labs drawn and patient cleared for CT by [facility]. Patient to CT with team. NIHSS 2, see documentation for details and code stroke times. Patient with left leg weakness on exam. The following imaging was completed:  CT Head. Patient is not a candidate for IV Thrombolytic due to being outside window. Patient is not a candidate for IR due to LVO not suspected.   Care Plan: q2 NIHSS/VS.   Bedside handoff with ED RN Angelica Chessman.    Lucila Maine  Stroke Response RN

## 2022-11-08 NOTE — ED Provider Notes (Addendum)
[facility] EMERGENCY DEPARTMENT AT [facility] Provider Note   CSN: [id number] Arrival date & time: 11/08/22  4098  An emergency department physician performed an initial assessment on this suspected stroke patient at 0914.  History  Chief Complaint  Patient presents with   Code Stroke    Samuel Pearson is a 77 y.o. male.  HPI  Patient is a 77 year old male with past medical history significant for anxiety, hypertension, heart murmur, pernicious anemia  Patient presents emergency room today via EMS as a code stroke.  Seems that patient woke up this morning and attempted to walk to the restroom but fell because of left-sided weakness.  He describes some back pain to his wife and while on the ground seems that he had an episode of bowel incontinence.  He denies any pain currently.  He denies any chest pain or difficulty breathing.  No nausea or vomiting.  Level 5 caveat due to acuity     Home Medications Prior to Admission medications   Medication Sig Start Date End Date Taking? Authorizing Provider  albuterol (VENTOLIN HFA) 108 (90 Base) MCG/ACT inhaler Inhale 2 puffs into the lungs every 6 (six) hours as needed for wheezing. 05/16/22   [provider]  chlorpheniramine (CHLOR-TRIMETON) 4 MG tablet Take 4 mg by mouth 2 (two) times daily as needed for allergies.    [provider]  Cyanocobalamin (VITAMIN B-12) 1000 MCG SUBL Place 2 tablets (2,000 mcg total) under the tongue daily. 12/23/19   Panosh, Neta Mends, MD  desonide (DESOWEN) 0.05 % ointment Apply 1 Application topically 2 (two) times daily as needed (mild rash flare). Okay to use on the face, neck, groin area. Do not use more than 1 week at a time. 12/13/21   Ellamae Sia, DO  diphenhydrAMINE (BENADRYL) 25 MG tablet Take 25 mg by mouth every 6 (six) hours as needed.    [provider]  lisinopril (ZESTRIL) 20 MG tablet TAKE 1 TABLET BY MOUTH EVERY DAY 08/10/22   Worthy Rancher B, FNP   metoprolol tartrate (LOPRESSOR) 25 MG tablet TAKE 1 TABLET BY MOUTH TWICE A DAY 07/25/22   Worthy Rancher B, FNP  niacin 500 MG tablet Take 500 mg by mouth daily.    [provider]  pimecrolimus (ELIDEL) 1 % cream apply twice a day around the eye 03/17/22   Ambs, Norvel Richards, FNP  prednisoLONE acetate (PRED FORTE) 1 % ophthalmic suspension Place 2 drops into both eyes 4 (four) times daily. 11/26/21   Nelwyn Salisbury, MD  tacrolimus (PROTOPIC) 0.1 % ointment Apply topically 2 (two) times daily. 03/22/22   Kozlow, Alvira Philips, MD  tobramycin-dexamethasone [facility]) ophthalmic solution 1 drop each eye 1 time per day for 5 days only 03/16/22   Kozlow, Alvira Philips, MD      Allergies    Pollen extract-tree extract [pollen extract]    Review of Systems   Review of Systems  Physical Exam Updated Vital Signs BP (!) 182/68   Pulse 99   Temp 98.2 F (36.8 C) (Oral)   Resp (!) 22   Ht 5\' 6"  (1.676 m)   Wt 72.6 kg   SpO2 100%   BMI 25.82 kg/m  Physical Exam Vitals and nursing note reviewed.  Constitutional:      General: He is not in acute distress. HENT:     Head: Normocephalic and atraumatic.     Nose: Nose normal.  Eyes:     General: No scleral icterus.  Cardiovascular:     Rate and Rhythm: Normal rate and regular rhythm.     Pulses: Normal pulses.     Heart sounds: Normal heart sounds.  Pulmonary:     Effort: Pulmonary effort is normal. No respiratory distress.     Breath sounds: No wheezing.  Abdominal:     Palpations: Abdomen is soft.     Tenderness: There is no abdominal tenderness.  Musculoskeletal:     Cervical back: Normal range of motion.     Right lower leg: No edema.     Left lower leg: No edema.  Skin:    General: Skin is warm and dry.     Capillary Refill: Capillary refill takes less than 2 seconds.  Neurological:     Mental Status: He is alert. Mental status is at baseline.     Comments: Alert and oriented to self, place, time and event.   Speech is fluent, clear  without dysarthria or dysphasia.   Bilateral grip strength symmetric, weakness of left upper extremity at the elbow with flexion extension and at the shoulder.  Right upper extremity with normal strength.  Left lower extremity with weakness at the knee hip and ankle.  Sensation intact in upper/lower extremities   CN I not tested  CN II grossly intact visual fields bilaterally. Did not visualize posterior eye.  CN III, IV, VI PERRLA and EOMs intact bilaterally  CN V Intact sensation to sharp and light touch to the face  CN VII facial movements symmetric  CN VIII not tested  CN IX, X no uvula deviation, symmetric rise of soft palate  CN XI 5/5 SCM and trapezius strength bilaterally  CN XII Midline tongue protrusion, symmetric L/R movements   Psychiatric:        Mood and Affect: Mood normal.        Behavior: Behavior normal.     ED Results / Procedures / Treatments   Labs (all labs ordered are listed, but only abnormal results are displayed) Labs Reviewed  CBC - Abnormal; Notable for the following components:      Result Value   RBC 3.15 (*)    Hemoglobin 10.6 (*)    HCT 32.7 (*)    MCV 103.8 (*)    Platelets 141 (*)    All other components within normal limits  COMPREHENSIVE METABOLIC PANEL - Abnormal; Notable for the following components:   CO2 17 (*)    Glucose, Bld 114 (*)    Calcium 8.0 (*)    Total Protein 6.0 (*)    All other components within normal limits  I-STAT CHEM 8, ED - Abnormal; Notable for the following components:   Sodium 147 (*)    Potassium 2.8 (*)    Chloride 115 (*)    Creatinine, Ser 0.40 (*)    Calcium, Ion 0.85 (*)    TCO2 16 (*)    Hemoglobin 9.5 (*)    HCT 28.0 (*)    All other components within normal limits  PROTIME-INR  APTT  DIFFERENTIAL  ETHANOL  VITAMIN B12  CBG MONITORING, ED  POC OCCULT BLOOD, ED    EKG EKG Interpretation Date/Time:  Tuesday November 08 2022 09:40:25 EDT Ventricular Rate:  94 PR Interval:  158 QRS  Duration:  105 QT Interval:  378 QTC Calculation: 473 R Axis:   13  Text Interpretation: Sinus rhythm Ventricular premature complex Confirmed by Kristine Royal [phone]) on 11/08/2022 10:42:50 AM  Radiology MR BRAIN WO CONTRAST  Result Date:  11/08/2022 CLINICAL DATA:  Mid-back pain, neuro deficit c/f demyelinating disease; Transient ischemic attack (TIA) Stroke, follow up Neuro deficit, acute, stroke suspected; Low back pain, no red flags, no prior management; L sided weakness EXAM: MRI HEAD WITHOUT CONTRAST MRI CERVICAL SPINE WITHOUT CONTRAST MRI THORACIC WITHOUT CONTRAST MRI LUMBAR WITHOUT CONTRAST TECHNIQUE: Multiplanar, multiecho pulse sequences of the brain and surrounding structures, and cervical, thoracic, and lumbar spine were obtained without intravenous contrast. COMPARISON:  CT Head 11/08/22 FINDINGS: MRI HEAD FINDINGS Brain: There are acute infarcts in the right ACA territory along the parasagittal right frontal lobe (series 4, image 20) and in the right frontal centrum semiovale (series 3, image 40). No hemorrhage. No extra-axial fluid collection. Hydrocephalus. Sequela of moderate chronic microvascular ischemic change. Generalized volume loss without lobar predominance. Partially empty sella. Vascular: Normal flow voids. Skull and upper cervical spine: Normal marrow signal. Below for cervical spine findings. Sinuses/Orbits: No middle ear or mastoid effusion. Paranasal sinuses are clear bilateral lens replacement. Orbits are otherwise unremarkable. Partially empty sella. Other: None. MRI CERVICAL SPINE FINDINGS Limitations: Assessment is limited due to degree of motion artifact. Additional axial T2 weighted sequences were not acquired. Alignment: Mild retrolisthesis of C3 on C4 and C4 on C5. Grade 1 anterolisthesis of C7 on T1. Vertebrae: No fracture, evidence of discitis, or bone lesion. There is likely degenerative osseous fusion of C5-C6. Severe disc space loss at C3-C4, C4-C5, and C6-C7. Cord:  There is likely cord signal abnormality (series 15, image 10) of the C3-C4 level. The appearance is more suggestive myelomalacia rather than demyelinating disease Posterior Fossa, vertebral arteries, paraspinal tissues: The degree of motion artifact precludes assessment of the paraspinal soft tissues and vascular flow voids Disc levels: Assessment for the degree of degenerative changes limited due to the degree of motion artifact. 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 MRI THORACIC SPINE FINDINGS Alignment:  Physiologic. Vertebrae: No fracture, evidence of discitis, or bone lesion. Cord:  Normal signal and morphology. Paraspinal and other soft tissues: Negative. Disc levels: No definite evidence of high-grade spinal canal or neural foraminal stenosis. MRI LUMBAR SPINE FINDINGS Limitations: Markedly limited assessment of the lumbar spine vertebral bodies on the sagittal STIR sequences due to the degree of motion artifact Segmentation:  Standard. Alignment: Trace retrolisthesis of L2 on L3. Grade 1 anterolisthesis L4 on L5. Vertebrae:  No fracture, evidence of discitis, or bone lesion. Conus medullaris and cauda equina: Conus extends to the L1 level. Conus and cauda equina appear normal. Paraspinal and other soft tissues: There is asymmetric atrophy of the right psoas muscle. There is also atrophy of the paraspinal musculature. Disc levels: Assessment for the degree of degenerative changes limited due to degree of motion artifact. Within this limitation there is moderate to severe spinal canal narrowing at L3-L4, L4-L5. There is severe right-sided neural foraminal narrowing at L4-L5. Moderate right-sided neural foraminal narrowing at L3-L4 and L5-S1 Severe facet degenerative change at L4-L5 and L5-S1 fluid in the facet joints. IMPRESSION: 1. 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  No hemorrhage. 2. Assessment of the cervical spine is markedly limited due to the degree of motion artifact. 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 3. Assessment of the lumbar spine is markedly limited due to  the degree of motion artifact. Within this limitation, there is moderate to severe spinal canal narrowing at L3-L4 and L4-L5 and severe right-sided neural foraminal narrowing at L4-L5. 4. Assessment of the thoracic spine is limited due to a combination of motion artifact on the sagittal sequences and lack of axial T2 weighted sequences within this limitation, no definite evidence of high grade spinal canal stenosis or cord signal abnormality. Findings were discussed with Dr. Rodena Medin on 11/08/22 at 1:24 PM. Electronically Signed   By: Lorenza Cambridge M.D.   On: 11/08/2022 13:25   MR THORACIC SPINE WO CONTRAST  Result Date: 11/08/2022 CLINICAL DATA:  Mid-back pain, neuro deficit c/f demyelinating disease; Transient ischemic attack (TIA) Stroke, follow up Neuro deficit, acute, stroke suspected; Low back pain, no red flags, no prior management; L sided weakness EXAM: MRI HEAD WITHOUT CONTRAST MRI CERVICAL SPINE WITHOUT CONTRAST MRI THORACIC WITHOUT CONTRAST MRI LUMBAR WITHOUT CONTRAST TECHNIQUE: Multiplanar, multiecho pulse sequences of the brain and surrounding structures, and cervical, thoracic, and lumbar spine were obtained without intravenous contrast. COMPARISON:  CT Head 11/08/22 FINDINGS: MRI HEAD FINDINGS Brain: There are acute infarcts in the right ACA territory along the parasagittal right frontal lobe (series 4, image 20) and in the right frontal centrum semiovale (series 3, image 40). No hemorrhage. No extra-axial fluid collection. Hydrocephalus. Sequela of moderate chronic microvascular ischemic change. Generalized volume loss  without lobar predominance. Partially empty sella. Vascular: Normal flow voids. Skull and upper cervical spine: Normal marrow signal. Below for cervical spine findings. Sinuses/Orbits: No middle ear or mastoid effusion. Paranasal sinuses are clear bilateral lens replacement. Orbits are otherwise unremarkable. Partially empty sella. Other: None. MRI CERVICAL SPINE FINDINGS Limitations: Assessment is limited due to degree of motion artifact. Additional axial T2 weighted sequences were not acquired. Alignment: Mild retrolisthesis of C3 on C4 and C4 on C5. Grade 1 anterolisthesis of C7 on T1. Vertebrae: No fracture, evidence of discitis, or bone lesion. There is likely degenerative osseous fusion of C5-C6. Severe disc space loss at C3-C4, C4-C5, and C6-C7. Cord: There is likely cord signal abnormality (series 15, image 10) of the C3-C4 level. The appearance is more suggestive myelomalacia rather than demyelinating disease Posterior Fossa, vertebral arteries, paraspinal tissues: The degree of motion artifact precludes assessment of the paraspinal soft tissues and vascular flow voids Disc levels: Assessment for the degree of degenerative changes limited due to the degree of motion artifact. 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 MRI THORACIC SPINE FINDINGS Alignment:  Physiologic. Vertebrae: No fracture, evidence of discitis, or bone lesion. Cord:  Normal signal and morphology. Paraspinal and other soft tissues: Negative. Disc levels: No definite evidence of high-grade spinal canal or neural foraminal stenosis. MRI LUMBAR SPINE FINDINGS Limitations: Markedly limited assessment of the lumbar spine vertebral bodies on the sagittal STIR sequences due to the degree of motion artifact Segmentation:  Standard. Alignment: Trace retrolisthesis of L2 on L3. Grade 1 anterolisthesis L4 on L5. Vertebrae:  No fracture, evidence of discitis, or bone lesion. Conus medullaris and cauda  equina: Conus extends to the L1 level. Conus and cauda equina appear normal. Paraspinal and other soft tissues: There is asymmetric atrophy of the right psoas muscle. There is also atrophy of the paraspinal musculature. Disc levels: Assessment for the degree of degenerative changes limited due to degree of motion artifact. Within this limitation there is moderate to severe spinal canal narrowing at L3-L4, L4-L5. There is severe right-sided neural foraminal narrowing at L4-L5.  Moderate right-sided neural foraminal narrowing at L3-L4 and L5-S1 Severe facet degenerative change at L4-L5 and L5-S1 fluid in the facet joints. IMPRESSION: 1. 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 No hemorrhage. 2. Assessment of the cervical spine is markedly limited due to the degree of motion artifact. 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 3. Assessment of the lumbar spine is markedly limited due to the degree of motion artifact. Within this limitation, there is moderate to severe spinal canal narrowing at L3-L4 and L4-L5 and severe right-sided neural foraminal narrowing at L4-L5. 4. Assessment of the thoracic spine is limited due to a combination of motion artifact on the sagittal sequences and lack of axial T2 weighted sequences within this limitation, no definite evidence of high grade spinal canal stenosis or cord signal abnormality. Findings were discussed with Dr. Rodena Medin on 11/08/22 at 1:24 PM. Electronically Signed   By: Lorenza Cambridge M.D.   On: 11/08/2022 13:25   MR LUMBAR SPINE WO CONTRAST  Result Date: 11/08/2022 CLINICAL DATA:  Mid-back pain, neuro deficit c/f demyelinating disease; Transient ischemic attack (TIA) Stroke, follow up Neuro deficit, acute, stroke  suspected; Low back pain, no red flags, no prior management; L sided weakness EXAM: MRI HEAD WITHOUT CONTRAST MRI CERVICAL SPINE WITHOUT CONTRAST MRI THORACIC WITHOUT CONTRAST MRI LUMBAR WITHOUT CONTRAST TECHNIQUE: Multiplanar, multiecho pulse sequences of the brain and surrounding structures, and cervical, thoracic, and lumbar spine were obtained without intravenous contrast. COMPARISON:  CT Head 11/08/22 FINDINGS: MRI HEAD FINDINGS Brain: There are acute infarcts in the right ACA territory along the parasagittal right frontal lobe (series 4, image 20) and in the right frontal centrum semiovale (series 3, image 40). No hemorrhage. No extra-axial fluid collection. Hydrocephalus. Sequela of moderate chronic microvascular ischemic change. Generalized volume loss without lobar predominance. Partially empty sella. Vascular: Normal flow voids. Skull and upper cervical spine: Normal marrow signal. Below for cervical spine findings. Sinuses/Orbits: No middle ear or mastoid effusion. Paranasal sinuses are clear bilateral lens replacement. Orbits are otherwise unremarkable. Partially empty sella. Other: None. MRI CERVICAL SPINE FINDINGS Limitations: Assessment is limited due to degree of motion artifact. Additional axial T2 weighted sequences were not acquired. Alignment: Mild retrolisthesis of C3 on C4 and C4 on C5. Grade 1 anterolisthesis of C7 on T1. Vertebrae: No fracture, evidence of discitis, or bone lesion. There is likely degenerative osseous fusion of C5-C6. Severe disc space loss at C3-C4, C4-C5, and C6-C7. Cord: There is likely cord signal abnormality (series 15, image 10) of the C3-C4 level. The appearance is more suggestive myelomalacia rather than demyelinating disease Posterior Fossa, vertebral arteries, paraspinal tissues: The degree of motion artifact precludes assessment of the paraspinal soft tissues and vascular flow voids Disc levels: Assessment for the degree of degenerative changes limited due to the  degree of motion artifact. 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 MRI THORACIC SPINE FINDINGS Alignment:  Physiologic. Vertebrae: No fracture, evidence of discitis, or bone lesion. Cord:  Normal signal and morphology. Paraspinal and other soft tissues: Negative. Disc levels: No definite evidence of high-grade spinal canal or neural foraminal stenosis. MRI LUMBAR SPINE FINDINGS Limitations: Markedly limited assessment of the lumbar spine vertebral bodies on the sagittal STIR sequences due to the degree of motion  artifact Segmentation:  Standard. Alignment: Trace retrolisthesis of L2 on L3. Grade 1 anterolisthesis L4 on L5. Vertebrae:  No fracture, evidence of discitis, or bone lesion. Conus medullaris and cauda equina: Conus extends to the L1 level. Conus and cauda equina appear normal. Paraspinal and other soft tissues: There is asymmetric atrophy of the right psoas muscle. There is also atrophy of the paraspinal musculature. Disc levels: Assessment for the degree of degenerative changes limited due to degree of motion artifact. Within this limitation there is moderate to severe spinal canal narrowing at L3-L4, L4-L5. There is severe right-sided neural foraminal narrowing at L4-L5. Moderate right-sided neural foraminal narrowing at L3-L4 and L5-S1 Severe facet degenerative change at L4-L5 and L5-S1 fluid in the facet joints. IMPRESSION: 1. 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 No hemorrhage. 2. Assessment of the cervical spine is markedly limited due to the degree of motion artifact. 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 3. Assessment of the lumbar spine is markedly limited due to the degree of motion  artifact. Within this limitation, there is moderate to severe spinal canal narrowing at L3-L4 and L4-L5 and severe right-sided neural foraminal narrowing at L4-L5. 4. Assessment of the thoracic spine is limited due to a combination of motion artifact on the sagittal sequences and lack of axial T2 weighted sequences within this limitation, no definite evidence of high grade spinal canal stenosis or cord signal abnormality. Findings were discussed with Dr. Rodena Medin on 11/08/22 at 1:24 PM. Electronically Signed   By: Lorenza Cambridge M.D.   On: 11/08/2022 13:25   MR CERVICAL SPINE WO CONTRAST  Result Date: 11/08/2022 CLINICAL DATA:  Mid-back pain, neuro deficit c/f demyelinating disease; Transient ischemic attack (TIA) Stroke, follow up Neuro deficit, acute, stroke suspected; Low back pain, no red flags, no prior management; L sided weakness EXAM: MRI HEAD WITHOUT CONTRAST MRI CERVICAL SPINE WITHOUT CONTRAST MRI THORACIC WITHOUT CONTRAST MRI LUMBAR WITHOUT CONTRAST TECHNIQUE: Multiplanar, multiecho pulse sequences of the brain and surrounding structures, and cervical, thoracic, and lumbar spine were obtained without intravenous contrast. COMPARISON:  CT Head 11/08/22 FINDINGS: MRI HEAD FINDINGS Brain: There are acute infarcts in the right ACA territory along the parasagittal right frontal lobe (series 4, image 20) and in the right frontal centrum semiovale (series 3, image 40). No hemorrhage. No extra-axial fluid collection. Hydrocephalus. Sequela of moderate chronic microvascular ischemic change. Generalized volume loss without lobar predominance. Partially empty sella. Vascular: Normal flow voids. Skull and upper cervical spine: Normal marrow signal. Below for cervical spine findings. Sinuses/Orbits: No middle ear or mastoid effusion. Paranasal sinuses are clear bilateral lens replacement. Orbits are otherwise unremarkable. Partially empty sella. Other: None. MRI CERVICAL SPINE FINDINGS Limitations: Assessment is limited  due to degree of motion artifact. Additional axial T2 weighted sequences were not acquired. Alignment: Mild retrolisthesis of C3 on C4 and C4 on C5. Grade 1 anterolisthesis of C7 on T1. Vertebrae: No fracture, evidence of discitis, or bone lesion. There is likely degenerative osseous fusion of C5-C6. Severe disc space loss at C3-C4, C4-C5, and C6-C7. Cord: There is likely cord signal abnormality (series 15, image 10) of the C3-C4 level. The appearance is more suggestive myelomalacia rather than demyelinating disease Posterior Fossa, vertebral arteries, paraspinal tissues: The degree of motion artifact precludes assessment of  the paraspinal soft tissues and vascular flow voids Disc levels: Assessment for the degree of degenerative changes limited due to the degree of motion artifact. 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 MRI THORACIC SPINE FINDINGS Alignment:  Physiologic. Vertebrae: No fracture, evidence of discitis, or bone lesion. Cord:  Normal signal and morphology. Paraspinal and other soft tissues: Negative. Disc levels: No definite evidence of high-grade spinal canal or neural foraminal stenosis. MRI LUMBAR SPINE FINDINGS Limitations: Markedly limited assessment of the lumbar spine vertebral bodies on the sagittal STIR sequences due to the degree of motion artifact Segmentation:  Standard. Alignment: Trace retrolisthesis of L2 on L3. Grade 1 anterolisthesis L4 on L5. Vertebrae:  No fracture, evidence of discitis, or bone lesion. Conus medullaris and cauda equina: Conus extends to the L1 level. Conus and cauda equina appear normal. Paraspinal and other soft tissues: There is asymmetric atrophy of the right psoas muscle. There is also atrophy of the paraspinal musculature. Disc levels: Assessment for the degree of degenerative changes limited due to degree of motion artifact. Within this limitation there is moderate to severe spinal canal narrowing at L3-L4,  L4-L5. There is severe right-sided neural foraminal narrowing at L4-L5. Moderate right-sided neural foraminal narrowing at L3-L4 and L5-S1 Severe facet degenerative change at L4-L5 and L5-S1 fluid in the facet joints. IMPRESSION: 1. 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 No hemorrhage. 2. Assessment of the cervical spine is markedly limited due to the degree of motion artifact. 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 3. Assessment of the lumbar spine is markedly limited due to the degree of motion artifact. Within this limitation, there is moderate to severe spinal canal narrowing at L3-L4 and L4-L5 and severe right-sided neural foraminal narrowing at L4-L5. 4. Assessment of the thoracic spine is limited due to a combination of motion artifact on the sagittal sequences and lack of axial T2 weighted sequences within this limitation, no definite evidence of high grade spinal canal stenosis or cord signal abnormality. Findings were discussed with Dr. Rodena Medin on 11/08/22 at 1:24 PM. Electronically Signed   By: Lorenza Cambridge M.D.   On: 11/08/2022 13:25   CT HEAD CODE STROKE WO CONTRAST  Result Date: 11/08/2022 CLINICAL DATA:  Code stroke.  Left-sided weakness EXAM: CT HEAD WITHOUT CONTRAST TECHNIQUE: Contiguous axial images were obtained from the base of the skull through the vertex without intravenous contrast. RADIATION DOSE REDUCTION: This exam was performed according to the departmental dose-optimization program which includes automated exposure control, adjustment of the mA and/or kV according to patient size and/or use of iterative reconstruction technique. COMPARISON:  None Available. FINDINGS: Brain: No evidence of acute infarction, hemorrhage,  hydrocephalus, extra-axial collection or mass lesion/mass effect. Chronic small vessel ischemia in the cerebral white matter to a moderate degree. Discrete and chronic appearing infarct at the left putamen and medial right thalamus. Moderate cerebral volume loss which is generalized. Vascular: No hyperdense vessel or unexpected calcification. Skull: Normal. Negative for fracture or focal lesion. Sinuses/Orbits: No acute finding. Other: Prelim sent to Dr. Selina Cooley in epic chat. ASPECTS [facility] Stroke Program Early CT Score) - Ganglionic level infarction (caudate, lentiform nuclei, internal capsule, insula, M1-M3 cortex): 7 - Supraganglionic infarction (M4-M6 cortex): 3 Total score (0-10 with 10  being normal): 10 IMPRESSION: 1. No acute finding. 2. 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 Electronically Signed   By: Tiburcio Pea M.D.   On: 11/08/2022 09:38    Procedures .Critical Care  Performed by: Gailen Shelter, PA Authorized by: Gailen Shelter, PA   Critical care provider statement:    Critical care time (minutes):  35   Critical care time was exclusive of:  Separately billable procedures and treating other patients and teaching time   Critical care was necessary to treat or prevent imminent or life-threatening deterioration of the following conditions: Stroke.   Critical care was time spent personally by me on the following activities:  Development of treatment plan with patient or surrogate, review of old charts, re-evaluation of patient's condition, pulse oximetry, ordering and review of radiographic studies, ordering and review of laboratory studies, ordering and performing treatments and interventions, obtaining history from patient or surrogate, examination of patient and evaluation of patient's response to treatment   Care discussed with: admitting provider       Medications Ordered in ED Medications  sodium chloride flush  (NS) 0.9 % injection 3 mL (3 mLs Intravenous Given 11/08/22 0948)  lactated ringers bolus 1,000 mL (0 mLs Intravenous Stopped 11/08/22 1324)  LORazepam (ATIVAN) tablet 2 mg (2 mg Oral Given 11/08/22 1007)  LORazepam (ATIVAN) injection 0.5 mg (0.5 mg Intravenous Given 11/08/22 1147)  LORazepam (ATIVAN) injection 1 mg (1 mg Intravenous Given 11/08/22 1245)    ED Course/ Medical Decision Making/ A&P                              Medical Decision Making Amount and/or Complexity of Data Reviewed Labs: ordered. Radiology: ordered.  Risk Prescription drug management. Decision regarding hospitalization.   This patient presents to the ED for concern of weakness, this involves a number of treatment options, and is a complaint that carries with it a high risk of complications and morbidity. A differential diagnosis was considered for the patient's symptoms which is discussed below:   The differential diagnosis of weakness includes but is not limited to neurologic causes (GBS, myasthenia gravis, CVA, MS, ALS, transverse myelitis, spinal cord injury, CVA, botulism, ) and other causes: ACS, Arrhythmia, syncope, orthostatic hypotension, sepsis, hypoglycemia, electrolyte disturbance, hypothyroidism, respiratory failure, symptomatic anemia, dehydration, heat injury, polypharmacy, malignancy.  In this particular case given the one-sided weakness I have a high suspicion for stroke.   Co morbidities: Discussed in HPI   Brief History:  Patient is a 77 year old male with past medical history significant for anxiety, hypertension, heart murmur, pernicious anemia  Patient presents emergency room today via EMS as a code stroke.  Seems that patient woke up this morning and attempted to walk to the restroom but fell because of left-sided weakness.  He describes some back pain to his wife and while on the ground seems that he had an episode of bowel incontinence.  He denies any pain currently.  He denies any chest  pain or difficulty breathing.  No nausea or vomiting.  Level 5 caveat due to acuity    EMR reviewed including pt PMHx, past surgical history and past visits to ER.   See HPI for more details   Lab Tests:   I ordered and independently interpreted labs. Labs notable for CBC with mild anemia, no leukocytosis.  Coags normal, ethanol undetectable, notably did have a drop  in hemoglobin from baseline that was last measured 9 months ago.  He has a diagnosis of pernicious anemia will obtain a vitamin B12 level and rectal exam which was completed showed brown stool was negative on occult testing  Imaging Studies:  Abnormal findings. I personally reviewed all imaging studies. Imaging notable for  IMPRESSION:  1. Acute infarcts in the right ACA territory along the parasagittal  right frontal cortex and in the right frontal centrum semiovale. No  hemorrhage.  2. Assessment of the cervical spine is markedly limited due to the  degree of motion artifact. Within this limitation, there is severe  spinal canal stenosis at C3-C4 and C4-C5 with likely cord signal  abnormality at the C3-C4 level, which is more suggestive of  myelomalacia rather than demyelinating disease.  3. Assessment of the lumbar spine is markedly limited due to the  degree of motion artifact. Within this limitation, there is moderate  to severe spinal canal narrowing at L3-L4 and L4-L5 and severe  right-sided neural foraminal narrowing at L4-L5.  4. Assessment of the thoracic spine is limited due to a combination  of motion artifact on the sagittal sequences and lack of axial T2  weighted sequences within this limitation, no definite evidence of  high grade spinal canal stenosis or cord signal abnormality.    Cardiac Monitoring:  The patient was maintained on a cardiac monitor.  I personally viewed and interpreted the cardiac monitored which showed an underlying rhythm of: NSR EKG non-ischemic   Medicines ordered:  I  ordered medication including LR 1L, ativan  for stroke Reevaluation of the patient after these medicines showed that the patient stayed the same I have reviewed the patients home medicines and have made adjustments as needed   Critical Interventions:  airway monitoring   Consults/Attending Physician   I discussed this case with my attending physician who cosigned this note including patient's presenting symptoms, physical exam, and planned diagnostics and interventions. Attending physician stated agreement with plan or made changes to plan which were implemented.  Discussed with Dr. Selina Cooley of neurology  Discussed with Dr. Katrinka Blazing who will admit  Reevaluation:  After the interventions noted above I re-evaluated patient and found that they have :stayed the same   Social Determinants of Health:      Problem List / ED Course:  Acute ACA stroke Anemia   Dispostion:  After consideration of the diagnostic results and the patients response to treatment, I feel that the patent would benefit from admission.    Final Clinical Impression(s) / ED Diagnoses Final diagnoses:  Cerebrovascular accident (CVA), unspecified mechanism (HCC)  Spinal stenosis, unspecified spinal region    Rx / DC Orders ED Discharge Orders     None         Gailen Shelter, [place] 11/08/22 1443    Gailen Shelter, [place] 11/08/22 1443    Wynetta Fines, MD 11/08/22 [phone]

## 2022-11-08 NOTE — ED Notes (Signed)
Patient resting in bed, no s/s of distress, family present at bedside.

## 2022-11-08 NOTE — Progress Notes (Signed)
EEG complete - results pending 

## 2022-11-08 NOTE — Consult Note (Signed)
Neurology Consultation  Reason for Consult: Code Stroke Referring Physician: Rodena Medin  CC: left leg weakness  History is obtained from: patient  HPI: Samuel Pearson is a 77 y.o. male with a past medical history of antexity, heart murmur, HTN, anemia presenting with left leg weakness. Patient presents from home reporting left leg weakness. He woke up this morning due to his back pain. When he stood up he noted that his left leg was weak and fell on the floor walking to the bathroom. He reports bowel incontinence. He states that he pressure washed all day yesterday. He is not currently having back pain.   Reassessed later in the afternoon and patient is noted to have twitching/jerking movements in his bilateral lower extremities, left worse than right. Wife and daughter are at the bedside and they state that he did not do any power washing yesterday. He had a normal day and was in his usual state of health. His wife states that he called her this morning from the bathroom for help. He was on the ground when she got to the bathroom. They spent about 2.5 hours trying to get him up and then she called EMS for assistance. She noted left sided weakness as well.   LKW: 2100 7/1 TNK given?: No outside of window Premorbid modified Rankin scale (mRS): 0 0-Completely asymptomatic and back to baseline post-stroke  ROS: Full ROS was performed and is negative except as noted in the HPI.   Past Medical History:  Diagnosis Date   Allergic rhinitis    Anxiety    Detached retina    Heart murmur    has had one when a child and checked out ok  never? 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  elevated MCV  No other sx      Family History  Problem Relation Age of Onset   Arthritis Mother    Hypertension Mother    Arthritis Father    Hypertension Father      Social History:   reports that he has never smoked. He has never used smokeless tobacco. He reports current alcohol use of about 14.0 - 21.0 standard drinks of alcohol per week. He reports that he does not use drugs.  Medications No current facility-administered medications for this encounter.  Current Outpatient Medications:    chlorpheniramine (CHLOR-TRIMETON) 4 MG tablet, Take 4 mg by mouth 2 (two) times daily as needed for allergies., Disp: , Rfl:    Cyanocobalamin (VITAMIN B-12) 1000 MCG SUBL, Place 2 tablets (2,000 mcg total) under the tongue daily., Disp: 180 tablet, Rfl: 3   desonide (DESOWEN) 0.05 % ointment, Apply 1 Application topically 2 (two) times daily as needed (mild rash flare). Okay to use on the face, neck, groin area. Do not use more than 1 week at a time., Disp: 15 g, Rfl: 1   diphenhydrAMINE (BENADRYL) 25 MG tablet, Take 25 mg by mouth every 6 (six) hours as needed., Disp: , Rfl:    lisinopril (ZESTRIL) 20 MG tablet, TAKE 1 TABLET BY MOUTH EVERY DAY, Disp: 90 tablet, Rfl: 1   metoprolol tartrate (LOPRESSOR) 25 MG tablet, TAKE 1 TABLET BY MOUTH TWICE A DAY, Disp: 180 tablet, Rfl: 2   niacin 500 MG tablet, Take by mouth., Disp: , Rfl:    pimecrolimus (ELIDEL)  1 % cream, apply twice a day around the eye, Disp: 30 g, Rfl: 2   prednisoLONE acetate (PRED FORTE) 1 % ophthalmic suspension, Place 2 drops into both eyes 4 (four) times daily., Disp: 10 mL, Rfl: 0   tacrolimus (PROTOPIC) 0.1 % ointment, Apply topically 2 (two) times daily., Disp: 100 g, Rfl: 5   tobramycin-dexamethasone (TOBRADEX) ophthalmic solution, 1 drop each eye 1 time per day for 5 days only, Disp: 5 mL, Rfl: 0  Exam: Current vital signs: BP (!) 177/58   Pulse 90   Temp 97.9 F (36.6 C) (Oral)   Resp 19   Ht 5\' 6"  (1.676 m)   Wt 72.6 kg   SpO2 98%   BMI 25.82 kg/m  Vital signs in last 24 hours: Temp:  [97.9 F (36.6 C)] 97.9 F (36.6 C) (07/02 0942) Pulse Rate:  [90-91] 90 (07/02 0941) Resp:   [19] 19 (07/02 0941) BP: (177)/(58) 177/58 (07/02 0937) SpO2:  [97 %-98 %] 98 % (07/02 0941) Weight:  [72.6 kg] 72.6 kg (07/02 0936)  GENERAL: Awake, alert in NAD HEENT: - Normocephalic and atraumatic, dry mm, no LN++, no Thyromegally LUNGS - Clear to auscultation bilaterally with no wheezes CV - S1S2 RRR, no m/r/g, equal pulses bilaterally. ABDOMEN - Soft, nontender, nondistended with normoactive BS Ext: warm, well perfused, intact peripheral pulses, no edema  NEURO:  Mental Status: AA&Ox3  Language: speech is clear.  Naming, repetition, fluency, and comprehension intact. Cranial Nerves: PERRL. EOMI, visual fields full, no facial asymmetry, facial sensation intact, hearing intact, tongue/uvula/soft palate midline, normal sternocleidomastoid and trapezius muscle strength. No evidence of tongue atrophy or fasciculations Motor:  RUE 5/5 LUE 5/5- arm does not straighten completely but no drift with elevation  RLE 5/5 LLE 3+/5 - stiffness noted with passive ROM Denies pain with leg elevation  Tone: is normal and bulk is normal Sensation- Intact to light touch bilaterally Coordination: FTN intact bilaterally, no ataxia in BLE. Gait- deferred  NIHSS 1a Level of Conscious.: 0 1b LOC Questions: 0 1c LOC Commands: 0 2 Best Gaze: 0 3 Visual: 0 4 Facial Palsy: 0 5a Motor Arm - left: 0 5b Motor Arm - Right: 0 6a Motor Leg - Left: 2 6b Motor Leg - Right: 0 7 Limb Ataxia: 0 8 Sensory: 0 9 Best Language: 0 10 Dysarthria: 0 11 Extinct. and Inatten.: 0 TOTAL: 2   Labs I have reviewed labs in epic and the results pertinent to this consultation are:  CBC    Component Value Date/Time   WBC 8.3 11/08/2022 0916   RBC 3.15 (L) 11/08/2022 0916   HGB 9.5 (L) 11/08/2022 0931   HCT 28.0 (L) 11/08/2022 0931   PLT 141 (L) 11/08/2022 0916   MCV 103.8 (H) 11/08/2022 0916   MCH 33.7 11/08/2022 0916   MCHC 32.4 11/08/2022 0916   RDW 12.6 11/08/2022 0916   LYMPHSABS 0.7 11/08/2022 0916    MONOABS 0.8 11/08/2022 0916   EOSABS 0.0 11/08/2022 0916   BASOSABS 0.0 11/08/2022 0916    CMP     Component Value Date/Time   NA 147 (H) 11/08/2022 0931   K 2.8 (L) 11/08/2022 0931   CL 115 (H) 11/08/2022 0931   CO2 17 (L) 11/08/2022 0916   GLUCOSE 91 11/08/2022 0931   BUN 12 11/08/2022 0931   CREATININE 0.40 (L) 11/08/2022 0931   CALCIUM 8.0 (L) 11/08/2022 0916   PROT 6.0 (L) 11/08/2022 0916   ALBUMIN 3.5 11/08/2022 0916   AST 29  11/08/2022 0916   ALT 20 11/08/2022 0916   ALKPHOS 47 11/08/2022 0916   BILITOT 1.0 11/08/2022 0916   GFRNONAA >60 11/08/2022 0916   GFRAA 126 01/15/2008 0900    Lipid Panel     Component Value Date/Time   CHOL 190 01/18/2022 1055   TRIG 153.0 (H) 01/18/2022 1055   HDL 43.70 01/18/2022 1055   CHOLHDL 4 01/18/2022 1055   VLDL 30.6 01/18/2022 1055   LDLCALC 116 (H) 01/18/2022 1055     Imaging I have reviewed the images obtained:  CT-head- 1. No acute finding. 2. 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  MRI examination of the brain 1. 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 No hemorrhage.  MRI Cervical Spine Assessment of the cervical spine is markedly limited due to the degree of motion artifact. Within this limitation, there is severe spinal canal stenosis at C3-C4 and C4-C5 with likely cord signal abnormality at the C3-C4 level, which is more suggestive of myelomalacia rather than  demyelinating disease.  MRI Lumbar Spine Assessment of the lumbar spine is markedly limited due to the degree of motion artifact. Within this limitation, there is moderate to severe spinal canal narrowing at L3-L4 and L4-L5 and severe right-sided neural foraminal narrowing at L4-L5.  MRI Thoracic Spine  Assessment of the thoracic spine is limited due to a combination of motion artifact on the sagittal  sequences and lack of axial T2 weighted sequences within this limitation, no definite evidence of high grade spinal canal stenosis or cord signal abnormality.  Assessment:  77 y.o. male with a PMH of anxiety, heart murmur, HTN, anemia presenting with left leg weakness. Patient presents from home reporting left leg weakness. He woke up this morning due to his back pain. When he stood up he noted that his left leg was weak and fell on the floor walking to the bathroom. He reports bowel incontinence. He states that he pressure washed all day yesterday. He is not currently having back pain. He was found to have acute infarcts in the R ACA territory explaining his LLE weakness.  Impression: Stroke vs musculoskeletal etiology  Recommendations: - HgbA1c, fasting lipid panel - The following imaging if indicated  - Echocardiogram - Risk factor modification - Telemetry monitoring - PT consult, OT consult, Speech consult - Stroke team to follow   Patient seen and examined by NP/APP with MD. MD to update note as needed.   Elmer Picker, DNP, FNP-BC [facility] Pager: [phone]   Attending Neurohospitalist Addendum Patient seen and examined with APP/Resident. Agree with the history and physical as documented above. Agree with the plan as documented, which I helped formulate. I have edited the note above to reflect my full findings and recommendations. I have independently reviewed the chart, obtained history, review of systems and examined the patient.I have personally reviewed pertinent head/neck/spine imaging (CT/MRI). Please feel free to call with any questions.  Patient is slightly more altered than he was this AM therefore will obtain repeat head CT and EEG. 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 He has some wiggling movements of his RUE which are distractible and volitionally suppressible and do not  appear epileptic.  -- Bing Neighbors, MD [facility] [phone]  If 7pm- 7am, please page neurology on call as listed in AMION.

## 2022-11-08 NOTE — Progress Notes (Signed)
Echocardiogram 2D Echocardiogram has been performed.  Samuel Pearson 11/08/2022, 3:47 PM

## 2022-11-08 NOTE — ED Notes (Signed)
ED TO INPATIENT HANDOFF REPORT  ED Nurse Name and Phone #: [phone]  S Name/Age/Gender Samuel Pearson 77 y.o. male Room/Bed: 037C/037C  Code Status   Code Status: Full Code  Home/SNF/Other Home Patient oriented to: self, place, and time Is this baseline? No   Triage Complete: Triage complete  Chief Complaint CVA (cerebral vascular accident) [facility]) [I63.9]  Triage Note PT BIB EMS for a code stroke, woke up and ambulated to the restroom, complained of back pain to his wife, fell, had loss of bowels, and was unable to get up and had left sided weakness.  Patient reports extensive back issues.   Patient is AOX4, able to answer questions and verbalize needs.      Allergies Allergies  Allergen Reactions   Cinnamon Other (See Comments)    intolerance   Garlic Other (See Comments)    intolerance   Other Other (See Comments)    All kind of nuts except peanuts - intolerance   Pollen Extract-Tree Extract [Pollen Extract]     Level of Care/Admitting Diagnosis ED Disposition     ED Disposition  Admit   Condition  --   Comment  Hospital Area: [facility] [[id number]]  Level of Care: Telemetry Medical [104]  May admit patient to [facility] or [facility] if equivalent level of care is available:: No  Covid Evaluation: Asymptomatic - no recent exposure (last 10 days) testing not required  Diagnosis: CVA (cerebral vascular accident) [facility]) [[id number]]  Admitting Physician: Clydie Braun [[id number]]  Attending Physician: Clydie Braun [[id number]]  Certification:: I certify this patient will need inpatient services for at least 2 midnights  Estimated Length of Stay: 2          B Medical/Surgery History Past Medical History:  Diagnosis Date   Allergic rhinitis    Anxiety    Detached retina    Heart murmur    has had one when a child and checked out ok  never? had an echo   History of chicken pox    Hx of nonmelanoma skin cancer    Hypertension    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   Low b12 elevated mma  Positive intrinsic factor  aby.  elevated MCV  No other sx    Past Surgical History:  Procedure Laterality Date   CATARACT EXTRACTION  2009   [facility]   CHOLECYSTECTOMY N/A 02/25/2020   Procedure: LAPAROSCOPIC CHOLECYSTECTOMY;  Surgeon: Harriette Bouillon, MD;  Location: [facility] OR;  Service: General;  Laterality: N/A;     A IV Location/Drains/Wounds Patient Lines/Drains/Airways Status     Active Line/Drains/Airways     Name Placement date Placement time Site Days   Peripheral IV 11/08/22 18 G 1" Anterior;Proximal;Right Forearm 11/08/22  0940  Forearm  less than 1   Closed System Drain 1 Right;Lateral Abdomen Bulb (JP) 19 Fr. 02/25/20  1109  Abdomen  987   Incision (Closed) 02/25/20 Abdomen Other (Comment) 02/25/20  1100  -- 987   Incision - 3 Ports Abdomen 1: Umbilicus 2: Upper;Mid 3: Right;Upper;Medial 02/25/20  1038  -- 987            Intake/Output Last 24 hours  Intake/Output Summary (Last 24 hours) at 11/08/2022 1823 Last data filed at 11/08/2022 1038 Gross per 24 hour  Intake --  Output 120 ml  Net -120 ml    Labs/Imaging Results for orders placed or performed during the hospital encounter of 11/08/22 (from the past 48 hour(s))  Protime-INR  Status: None   Collection Time: 11/08/22  9:16 AM  Result Value Ref Range   Prothrombin Time 15.2 11.4 - 15.2 seconds   INR 1.2 0.8 - 1.2    Comment: (NOTE) INR goal varies based on device and disease states. Performed at [facility], [address] [place], [place] [address]   APTT     Status: None   Collection Time: 11/08/22  9:16 AM  Result Value Ref Range   aPTT 27 24 - 36 seconds    Comment: Performed at [facility], [address] [place], [place] [address]  CBC     Status: Abnormal   Collection Time: 11/08/22  9:16 AM  Result Value Ref Range   WBC 8.3 4.0 - 10.5 K/uL   RBC 3.15 (L) 4.22 - 5.81 MIL/uL   Hemoglobin 10.6 (L) 13.0 - 17.0  g/dL   HCT 09.8 (L) 11.9 - 14.7 %   MCV 103.8 (H) 80.0 - 100.0 fL   MCH 33.7 26.0 - 34.0 pg   MCHC 32.4 30.0 - 36.0 g/dL   RDW 82.9 56.2 - 13.0 %   Platelets 141 (L) 150 - 400 K/uL    Comment: REPEATED TO VERIFY   nRBC 0.0 0.0 - 0.2 %    Comment: Performed at [facility], [address], [place], [place] [address]  Differential     Status: None   Collection Time: 11/08/22  9:16 AM  Result Value Ref Range   Neutrophils Relative % 83 %   Neutro Abs 6.8 1.7 - 7.7 K/uL   Lymphocytes Relative 8 %   Lymphs Abs 0.7 0.7 - 4.0 K/uL   Monocytes Relative 9 %   Monocytes Absolute 0.8 0.1 - 1.0 K/uL   Eosinophils Relative 0 %   Eosinophils Absolute 0.0 0.0 - 0.5 K/uL   Basophils Relative 0 %   Basophils Absolute 0.0 0.0 - 0.1 K/uL   Immature Granulocytes 0 %   Abs Immature Granulocytes 0.03 0.00 - 0.07 K/uL    Comment: Performed at [facility], [address], [place], [place] [address]  Comprehensive metabolic panel     Status: Abnormal   Collection Time: 11/08/22  9:16 AM  Result Value Ref Range   Sodium 138 135 - 145 mmol/L   Potassium 3.9 3.5 - 5.1 mmol/L    Comment: HEMOLYSIS AT THIS LEVEL MAY AFFECT RESULT   Chloride 110 98 - 111 mmol/L   CO2 17 (L) 22 - 32 mmol/L   Glucose, Bld 114 (H) 70 - 99 mg/dL    Comment: Glucose reference range applies only to samples taken after fasting for at least 8 hours.   BUN 13 8 - 23 mg/dL   Creatinine, Ser 9.52 0.61 - 1.24 mg/dL   Calcium 8.0 (L) 8.9 - 10.3 mg/dL   Total Protein 6.0 (L) 6.5 - 8.1 g/dL   Albumin 3.5 3.5 - 5.0 g/dL   AST 29 15 - 41 U/L    Comment: HEMOLYSIS AT THIS LEVEL MAY AFFECT RESULT   ALT 20 0 - 44 U/L    Comment: HEMOLYSIS AT THIS LEVEL MAY AFFECT RESULT   Alkaline Phosphatase 47 38 - 126 U/L   Total Bilirubin 1.0 0.3 - 1.2 mg/dL    Comment: HEMOLYSIS AT THIS LEVEL MAY AFFECT RESULT   GFR, Estimated >60 >60 mL/min    Comment: (NOTE) Calculated using the CKD-EPI Creatinine Equation (2021)    Anion gap 11  5 -  15    Comment: Performed at [facility], [address], [place], [place] [address]  Ethanol     Status: None   Collection Time: 11/08/22  9:16 AM  Result Value Ref Range   Alcohol, Ethyl (B) <10 <10 mg/dL    Comment: (NOTE) Lowest detectable limit for serum alcohol is 10 mg/dL.  For medical purposes only. Performed at [facility], [address], [place], [place] [address]   CBG monitoring, ED     Status: None   Collection Time: 11/08/22  9:21 AM  Result Value Ref Range   Glucose-Capillary 79 70 - 99 mg/dL    Comment: Glucose reference range applies only to samples taken after fasting for at least 8 hours.  I-stat chem 8, ED     Status: Abnormal   Collection Time: 11/08/22  9:31 AM  Result Value Ref Range   Sodium 147 (H) 135 - 145 mmol/L   Potassium 2.8 (L) 3.5 - 5.1 mmol/L   Chloride 115 (H) 98 - 111 mmol/L   BUN 12 8 - 23 mg/dL   Creatinine, Ser 0.98 (L) 0.61 - 1.24 mg/dL   Glucose, Bld 91 70 - 99 mg/dL    Comment: Glucose reference range applies only to samples taken after fasting for at least 8 hours.   Calcium, Ion 0.85 (LL) 1.15 - 1.40 mmol/L   TCO2 16 (L) 22 - 32 mmol/L   Hemoglobin 9.5 (L) 13.0 - 17.0 g/dL   HCT 11.9 (L) 14.7 - 82.9 %   Comment NOTIFIED PHYSICIAN   POC occult blood, ED     Status: None   Collection Time: 11/08/22  2:26 PM  Result Value Ref Range   Fecal Occult Bld NEGATIVE NEGATIVE   ECHOCARDIOGRAM COMPLETE  Result Date: 11/08/2022    ECHOCARDIOGRAM REPORT   Patient Name:   Samuel Pearson Date of Exam: 11/08/2022 Medical Rec #:  [id number]        Height:       66.0 in Accession #:    [id number]       Weight:       160.0 lb Date of Birth:  1946-01-15        BSA:          1.819 m Patient Age:    77 years         BP:           161/73 mmHg Patient Gender: M                HR:           100 bpm. Exam Location:  Inpatient Procedure: 2D Echo, Cardiac Doppler and Color Doppler Indications:    Stroke I63.9  History:        Patient has prior  history of Echocardiogram examinations, most                 recent 02/25/2020. Stroke; Risk Factors:Hypertension.  Sonographer:    Lucendia Herrlich Referring Phys: [id number] RONDELL A SMITH IMPRESSIONS  1. Left ventricular ejection fraction, by estimation, is 60 to 65%. The left ventricle has normal function. The left ventricle has no regional wall motion abnormalities. Left ventricular diastolic parameters are indeterminate.  2. Right ventricular systolic function is normal. The right ventricular size is normal.  3. Left atrial size was moderately dilated.  4. The mitral valve is normal in structure. Trivial mitral valve regurgitation. No evidence of mitral stenosis. Moderate mitral annular calcification.  5. The aortic valve is tricuspid. There is mild calcification of the aortic valve. There is mild thickening of the aortic valve. Aortic valve regurgitation is mild. Aortic valve sclerosis is present, with no evidence of aortic valve stenosis.  6. Aortic dilatation noted. There is mild dilatation of the ascending aorta, measuring 38 mm.  7. The inferior vena cava is normal in size with greater than 50% respiratory variability, suggesting right atrial pressure of 3 mmHg. FINDINGS  Left Ventricle: Left ventricular ejection fraction, by estimation, is 60 to 65%. The left ventricle has normal function. The left ventricle has no regional wall motion abnormalities. The left ventricular internal cavity size was normal in size. There is  no left ventricular hypertrophy. Left ventricular diastolic parameters are indeterminate. Right Ventricle: The right ventricular size is normal. No increase in right ventricular wall thickness. Right ventricular systolic function is normal. Left Atrium: Left atrial size was moderately dilated. Right Atrium: Right atrial size was normal in size. Pericardium: There is no evidence of pericardial effusion. Mitral Valve: The mitral valve is normal in structure. There is mild thickening of the  mitral valve leaflet(s). There is mild calcification of the mitral valve leaflet(s). Moderate mitral annular calcification. Trivial mitral valve regurgitation. No evidence of mitral valve stenosis. Tricuspid Valve: The tricuspid valve is normal in structure. Tricuspid valve regurgitation is not demonstrated. No evidence of tricuspid stenosis. Aortic Valve: The aortic valve is tricuspid. There is mild calcification of the aortic valve. There is mild thickening of the aortic valve. Aortic valve regurgitation is mild. Aortic valve sclerosis is present, with no evidence of aortic valve stenosis. Aortic valve mean gradient measures 8.0 mmHg. Aortic valve peak gradient measures 16.8 mmHg. Aortic valve area, by VTI measures 2.52 cm. Pulmonic Valve: The pulmonic valve was normal in structure. Pulmonic valve regurgitation is not visualized. No evidence of pulmonic stenosis. Aorta: Aortic dilatation noted. There is mild dilatation of the ascending aorta, measuring 38 mm. Venous: The inferior vena cava is normal in size with greater than 50% respiratory variability, suggesting right atrial pressure of 3 mmHg. IAS/Shunts: No atrial level shunt detected by color flow Doppler.  LEFT VENTRICLE PLAX 2D LVIDd:         4.90 cm   Diastology LVIDs:         3.50 cm   LV e' medial:  7.62 cm/s LV PW:         1.00 cm   LV e' lateral: 9.32 cm/s LV IVS:        1.10 cm LVOT diam:     2.30 cm LV SV:         84 LV SV Index:   46 LVOT Area:     4.15 cm  RIGHT VENTRICLE             IVC RV S prime:     26.70 cm/s  IVC diam: 1.50 cm TAPSE (M-mode): 2.6 cm LEFT ATRIUM           Index LA diam:      4.40 cm 2.42 cm/m LA Vol (A4C): 45.2 ml 24.88 ml/m  AORTIC VALVE AV Area (Vmax):    2.45 cm AV Area (Vmean):   2.52 cm AV Area (VTI):     2.52 cm AV Vmax:           205.00 cm/s AV Vmean:          128.000 cm/s AV VTI:  0.333 m AV Peak Grad:      16.8 mmHg AV Mean Grad:      8.0 mmHg LVOT Vmax:         121.00 cm/s LVOT Vmean:        77.700  cm/s LVOT VTI:          0.202 m LVOT/AV VTI ratio: 0.61  AORTA Ao Root diam: 3.60 cm Ao Asc diam:  3.80 cm TRICUSPID VALVE TR Peak grad:   50.4 mmHg TR Vmax:        355.00 cm/s  SHUNTS Systemic VTI:  0.20 m Systemic Diam: 2.30 cm Charlton Haws MD Electronically signed by Charlton Haws MD Signature Date/Time: 11/08/2022/4:11:51 PM    Final    MR BRAIN WO CONTRAST  Result Date: 11/08/2022 CLINICAL DATA:  Mid-back pain, neuro deficit c/f demyelinating disease; Transient ischemic attack (TIA) Stroke, follow up Neuro deficit, acute, stroke suspected; Low back pain, no red flags, no prior management; L sided weakness EXAM: MRI HEAD WITHOUT CONTRAST MRI CERVICAL SPINE WITHOUT CONTRAST MRI THORACIC WITHOUT CONTRAST MRI LUMBAR WITHOUT CONTRAST TECHNIQUE: Multiplanar, multiecho pulse sequences of the brain and surrounding structures, and cervical, thoracic, and lumbar spine were obtained without intravenous contrast. COMPARISON:  CT Head 11/08/22 FINDINGS: MRI HEAD FINDINGS Brain: There are acute infarcts in the right ACA territory along the parasagittal right frontal lobe (series 4, image 20) and in the right frontal centrum semiovale (series 3, image 40). No hemorrhage. No extra-axial fluid collection. Hydrocephalus. Sequela of moderate chronic microvascular ischemic change. Generalized volume loss without lobar predominance. Partially empty sella. Vascular: Normal flow voids. Skull and upper cervical spine: Normal marrow signal. Below for cervical spine findings. Sinuses/Orbits: No middle ear or mastoid effusion. Paranasal sinuses are clear bilateral lens replacement. Orbits are otherwise unremarkable. Partially empty sella. Other: None. MRI CERVICAL SPINE FINDINGS Limitations: Assessment is limited due to degree of motion artifact. Additional axial T2 weighted sequences were not acquired. Alignment: Mild retrolisthesis of C3 on C4 and C4 on C5. Grade 1 anterolisthesis of C7 on T1. Vertebrae: No fracture, evidence of  discitis, or bone lesion. There is likely degenerative osseous fusion of C5-C6. Severe disc space loss at C3-C4, C4-C5, and C6-C7. Cord: There is likely cord signal abnormality (series 15, image 10) of the C3-C4 level. The appearance is more suggestive myelomalacia rather than demyelinating disease Posterior Fossa, vertebral arteries, paraspinal tissues: The degree of motion artifact precludes assessment of the paraspinal soft tissues and vascular flow voids Disc levels: Assessment for the degree of degenerative changes limited due to the degree of motion artifact. 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 MRI THORACIC SPINE FINDINGS Alignment:  Physiologic. Vertebrae: No fracture, evidence of discitis, or bone lesion. Cord:  Normal signal and morphology. Paraspinal and other soft tissues: Negative. Disc levels: No definite evidence of high-grade spinal canal or neural foraminal stenosis. MRI LUMBAR SPINE FINDINGS Limitations: Markedly limited assessment of the lumbar spine vertebral bodies on the sagittal STIR sequences due to the degree of motion artifact Segmentation:  Standard. Alignment: Trace retrolisthesis of L2 on L3. Grade 1 anterolisthesis L4 on L5. Vertebrae:  No fracture, evidence of discitis, or bone lesion. Conus medullaris and cauda equina: Conus extends to the L1 level. Conus and cauda equina appear normal. Paraspinal and other soft tissues: There is asymmetric atrophy of the right psoas muscle. There is also atrophy of the paraspinal musculature. Disc levels: Assessment for the degree of degenerative changes limited due  to degree of motion artifact. Within this limitation there is moderate to severe spinal canal narrowing at L3-L4, L4-L5. There is severe right-sided neural foraminal narrowing at L4-L5. Moderate right-sided neural foraminal narrowing at L3-L4 and L5-S1 Severe facet degenerative change at L4-L5 and L5-S1 fluid in the facet joints.  IMPRESSION: 1. 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 No hemorrhage. 2. Assessment of the cervical spine is markedly limited due to the degree of motion artifact. 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 3. Assessment of the lumbar spine is markedly limited due to the degree of motion artifact. Within this limitation, there is moderate to severe spinal canal narrowing at L3-L4 and L4-L5 and severe right-sided neural foraminal narrowing at L4-L5. 4. Assessment of the thoracic spine is limited due to a combination of motion artifact on the sagittal sequences and lack of axial T2 weighted sequences within this limitation, no definite evidence of high grade spinal canal stenosis or cord signal abnormality. Findings were discussed with Dr. Rodena Medin on 11/08/22 at 1:24 PM. Electronically Signed   By: Lorenza Cambridge M.D.   On: 11/08/2022 13:25   MR THORACIC SPINE WO CONTRAST  Result Date: 11/08/2022 CLINICAL DATA:  Mid-back pain, neuro deficit c/f demyelinating disease; Transient ischemic attack (TIA) Stroke, follow up Neuro deficit, acute, stroke suspected; Low back pain, no red flags, no prior management; L sided weakness EXAM: MRI HEAD WITHOUT CONTRAST MRI CERVICAL SPINE WITHOUT CONTRAST MRI THORACIC WITHOUT CONTRAST MRI LUMBAR WITHOUT CONTRAST TECHNIQUE: Multiplanar, multiecho pulse sequences of the brain and surrounding structures, and cervical, thoracic, and lumbar spine were obtained without intravenous contrast. COMPARISON:  CT Head 11/08/22 FINDINGS: MRI HEAD FINDINGS Brain: There are acute infarcts in the right ACA territory along the parasagittal right frontal lobe (series 4, image 20) and in the right frontal centrum semiovale (series 3, image 40). No  hemorrhage. No extra-axial fluid collection. Hydrocephalus. Sequela of moderate chronic microvascular ischemic change. Generalized volume loss without lobar predominance. Partially empty sella. Vascular: Normal flow voids. Skull and upper cervical spine: Normal marrow signal. Below for cervical spine findings. Sinuses/Orbits: No middle ear or mastoid effusion. Paranasal sinuses are clear bilateral lens replacement. Orbits are otherwise unremarkable. Partially empty sella. Other: None. MRI CERVICAL SPINE FINDINGS Limitations: Assessment is limited due to degree of motion artifact. Additional axial T2 weighted sequences were not acquired. Alignment: Mild retrolisthesis of C3 on C4 and C4 on C5. Grade 1 anterolisthesis of C7 on T1. Vertebrae: No fracture, evidence of discitis, or bone lesion. There is likely degenerative osseous fusion of C5-C6. Severe disc space loss at C3-C4, C4-C5, and C6-C7. Cord: There is likely cord signal abnormality (series 15, image 10) of the C3-C4 level. The appearance is more suggestive myelomalacia rather than demyelinating disease Posterior Fossa, vertebral arteries, paraspinal tissues: The degree of motion artifact precludes assessment of the paraspinal soft tissues and vascular flow voids Disc levels: Assessment for the degree of degenerative changes limited due to the degree of motion artifact. 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 MRI THORACIC SPINE FINDINGS Alignment:  Physiologic. Vertebrae: No fracture, evidence of discitis, or bone lesion. Cord:  Normal signal and morphology. Paraspinal and other soft tissues: Negative. Disc levels: No definite evidence of high-grade spinal canal or  neural foraminal stenosis. MRI LUMBAR SPINE FINDINGS Limitations: Markedly limited assessment of the lumbar spine vertebral bodies on the sagittal STIR sequences due to the degree of motion artifact Segmentation:  Standard. Alignment: Trace  retrolisthesis of L2 on L3. Grade 1 anterolisthesis L4 on L5. Vertebrae:  No fracture, evidence of discitis, or bone lesion. Conus medullaris and cauda equina: Conus extends to the L1 level. Conus and cauda equina appear normal. Paraspinal and other soft tissues: There is asymmetric atrophy of the right psoas muscle. There is also atrophy of the paraspinal musculature. Disc levels: Assessment for the degree of degenerative changes limited due to degree of motion artifact. Within this limitation there is moderate to severe spinal canal narrowing at L3-L4, L4-L5. There is severe right-sided neural foraminal narrowing at L4-L5. Moderate right-sided neural foraminal narrowing at L3-L4 and L5-S1 Severe facet degenerative change at L4-L5 and L5-S1 fluid in the facet joints. IMPRESSION: 1. 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 No hemorrhage. 2. Assessment of the cervical spine is markedly limited due to the degree of motion artifact. 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 3. Assessment of the lumbar spine is markedly limited due to the degree of motion artifact. Within this limitation, there is moderate to severe spinal canal narrowing at L3-L4 and L4-L5 and severe right-sided neural foraminal narrowing at L4-L5. 4. Assessment of the thoracic spine is limited due to a combination of motion artifact on the sagittal sequences and lack of axial T2 weighted sequences within this limitation, no definite evidence of high grade spinal canal stenosis or cord signal abnormality. Findings were discussed with Dr. Rodena Medin on 11/08/22 at 1:24 PM. Electronically Signed   By: Lorenza Cambridge M.D.   On: 11/08/2022 13:25   MR LUMBAR SPINE WO CONTRAST  Result Date:  11/08/2022 CLINICAL DATA:  Mid-back pain, neuro deficit c/f demyelinating disease; Transient ischemic attack (TIA) Stroke, follow up Neuro deficit, acute, stroke suspected; Low back pain, no red flags, no prior management; L sided weakness EXAM: MRI HEAD WITHOUT CONTRAST MRI CERVICAL SPINE WITHOUT CONTRAST MRI THORACIC WITHOUT CONTRAST MRI LUMBAR WITHOUT CONTRAST TECHNIQUE: Multiplanar, multiecho pulse sequences of the brain and surrounding structures, and cervical, thoracic, and lumbar spine were obtained without intravenous contrast. COMPARISON:  CT Head 11/08/22 FINDINGS: MRI HEAD FINDINGS Brain: There are acute infarcts in the right ACA territory along the parasagittal right frontal lobe (series 4, image 20) and in the right frontal centrum semiovale (series 3, image 40). No hemorrhage. No extra-axial fluid collection. Hydrocephalus. Sequela of moderate chronic microvascular ischemic change. Generalized volume loss without lobar predominance. Partially empty sella. Vascular: Normal flow voids. Skull and upper cervical spine: Normal marrow signal. Below for cervical spine findings. Sinuses/Orbits: No middle ear or mastoid effusion. Paranasal sinuses are clear bilateral lens replacement. Orbits are otherwise unremarkable. Partially empty sella. Other: None. MRI CERVICAL SPINE FINDINGS Limitations: Assessment is limited due to degree of motion artifact. Additional axial T2 weighted sequences were not acquired. Alignment: Mild retrolisthesis of C3 on C4 and C4 on C5. Grade 1 anterolisthesis of C7 on T1. Vertebrae: No fracture, evidence of discitis, or bone lesion. There is likely degenerative osseous fusion of C5-C6. Severe disc space loss at C3-C4, C4-C5, and C6-C7. Cord: There is likely cord signal abnormality (series 15, image 10)  of the C3-C4 level. The appearance is more suggestive myelomalacia rather than demyelinating disease Posterior Fossa, vertebral arteries, paraspinal tissues: The degree of motion artifact  precludes assessment of the paraspinal soft tissues and vascular flow voids Disc levels: Assessment for the degree of degenerative changes limited due to the degree of motion artifact. 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 MRI THORACIC SPINE FINDINGS Alignment:  Physiologic. Vertebrae: No fracture, evidence of discitis, or bone lesion. Cord:  Normal signal and morphology. Paraspinal and other soft tissues: Negative. Disc levels: No definite evidence of high-grade spinal canal or neural foraminal stenosis. MRI LUMBAR SPINE FINDINGS Limitations: Markedly limited assessment of the lumbar spine vertebral bodies on the sagittal STIR sequences due to the degree of motion artifact Segmentation:  Standard. Alignment: Trace retrolisthesis of L2 on L3. Grade 1 anterolisthesis L4 on L5. Vertebrae:  No fracture, evidence of discitis, or bone lesion. Conus medullaris and cauda equina: Conus extends to the L1 level. Conus and cauda equina appear normal. Paraspinal and other soft tissues: There is asymmetric atrophy of the right psoas muscle. There is also atrophy of the paraspinal musculature. Disc levels: Assessment for the degree of degenerative changes limited due to degree of motion artifact. Within this limitation there is moderate to severe spinal canal narrowing at L3-L4, L4-L5. There is severe right-sided neural foraminal narrowing at L4-L5. Moderate right-sided neural foraminal narrowing at L3-L4 and L5-S1 Severe facet degenerative change at L4-L5 and L5-S1 fluid in the facet joints. IMPRESSION: 1. 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 No hemorrhage. 2. Assessment of the cervical spine is markedly limited due to the degree of motion artifact. 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 3. Assessment of the lumbar spine is markedly limited due to the degree of motion artifact. Within this limitation, there is moderate to severe spinal canal narrowing at L3-L4 and L4-L5 and severe right-sided neural foraminal narrowing at L4-L5. 4. Assessment of the thoracic spine is limited due to a combination of motion artifact on the sagittal sequences and lack of axial T2 weighted sequences within this limitation, no definite evidence of high grade spinal canal stenosis or cord signal abnormality. Findings were discussed with Dr. Rodena Medin on 11/08/22 at 1:24 PM. Electronically Signed   By: Lorenza Cambridge M.D.   On: 11/08/2022 13:25   MR CERVICAL SPINE WO CONTRAST  Result Date: 11/08/2022 CLINICAL DATA:  Mid-back pain, neuro deficit c/f demyelinating disease; Transient ischemic attack (TIA) Stroke, follow up Neuro deficit, acute, stroke suspected; Low back pain, no red flags, no prior management; L sided weakness EXAM: MRI HEAD WITHOUT CONTRAST MRI CERVICAL SPINE WITHOUT CONTRAST MRI THORACIC WITHOUT CONTRAST MRI LUMBAR WITHOUT CONTRAST TECHNIQUE: Multiplanar, multiecho pulse sequences of the brain and surrounding structures, and cervical, thoracic, and lumbar spine were obtained without intravenous contrast. COMPARISON:  CT Head 11/08/22 FINDINGS: MRI HEAD FINDINGS Brain: There are acute infarcts in the right ACA territory along the parasagittal right frontal lobe (series 4, image 20) and in the right frontal centrum semiovale (series 3, image 40). No hemorrhage. No extra-axial fluid collection. Hydrocephalus. Sequela of moderate chronic microvascular ischemic change. Generalized volume loss without lobar predominance. Partially empty sella. Vascular: Normal flow voids. Skull and upper cervical spine: Normal marrow signal.  Below for cervical spine findings. Sinuses/Orbits: No middle ear or mastoid effusion. Paranasal sinuses are clear  bilateral lens replacement. Orbits are otherwise unremarkable. Partially empty sella. Other: None. MRI CERVICAL SPINE FINDINGS Limitations: Assessment is limited due to degree of motion artifact. Additional axial T2 weighted sequences were not acquired. Alignment: Mild retrolisthesis of C3 on C4 and C4 on C5. Grade 1 anterolisthesis of C7 on T1. Vertebrae: No fracture, evidence of discitis, or bone lesion. There is likely degenerative osseous fusion of C5-C6. Severe disc space loss at C3-C4, C4-C5, and C6-C7. Cord: There is likely cord signal abnormality (series 15, image 10) of the C3-C4 level. The appearance is more suggestive myelomalacia rather than demyelinating disease Posterior Fossa, vertebral arteries, paraspinal tissues: The degree of motion artifact precludes assessment of the paraspinal soft tissues and vascular flow voids Disc levels: Assessment for the degree of degenerative changes limited due to the degree of motion artifact. 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 MRI THORACIC SPINE FINDINGS Alignment:  Physiologic. Vertebrae: No fracture, evidence of discitis, or bone lesion. Cord:  Normal signal and morphology. Paraspinal and other soft tissues: Negative. Disc levels: No definite evidence of high-grade spinal canal or neural foraminal stenosis. MRI LUMBAR SPINE FINDINGS Limitations: Markedly limited assessment of the lumbar spine vertebral bodies on the sagittal STIR sequences due to the degree of motion artifact Segmentation:  Standard. Alignment: Trace retrolisthesis of L2 on L3. Grade 1 anterolisthesis L4 on L5. Vertebrae:  No fracture, evidence of discitis, or bone lesion. Conus medullaris and cauda equina: Conus extends to the L1 level. Conus and cauda equina appear normal. Paraspinal and other soft tissues: There is asymmetric atrophy of the right psoas muscle. There is also atrophy of the paraspinal musculature. Disc levels: Assessment for the  degree of degenerative changes limited due to degree of motion artifact. Within this limitation there is moderate to severe spinal canal narrowing at L3-L4, L4-L5. There is severe right-sided neural foraminal narrowing at L4-L5. Moderate right-sided neural foraminal narrowing at L3-L4 and L5-S1 Severe facet degenerative change at L4-L5 and L5-S1 fluid in the facet joints. IMPRESSION: 1. 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 No hemorrhage. 2. Assessment of the cervical spine is markedly limited due to the degree of motion artifact. 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 3. Assessment of the lumbar spine is markedly limited due to the degree of motion artifact. Within this limitation, there is moderate to severe spinal canal narrowing at L3-L4 and L4-L5 and severe right-sided neural foraminal narrowing at L4-L5. 4. Assessment of the thoracic spine is limited due to a combination of motion artifact on the sagittal sequences and lack of axial T2 weighted sequences within this limitation, no definite evidence of high grade spinal canal stenosis or cord signal abnormality. Findings were discussed with Dr. Rodena Medin on 11/08/22 at 1:24 PM. Electronically Signed   By: Lorenza Cambridge M.D.   On: 11/08/2022 13:25   CT HEAD CODE STROKE WO CONTRAST  Result Date: 11/08/2022 CLINICAL DATA:  Code stroke.  Left-sided weakness EXAM: CT HEAD WITHOUT CONTRAST TECHNIQUE: Contiguous axial images were obtained from the base of the skull through the vertex without intravenous contrast. RADIATION DOSE REDUCTION: This exam was performed according to the departmental dose-optimization  program which includes automated exposure control, adjustment of the mA and/or kV according to  patient size and/or use of iterative reconstruction technique. COMPARISON:  None Available. FINDINGS: Brain: No evidence of acute infarction, hemorrhage, hydrocephalus, extra-axial collection or mass lesion/mass effect. Chronic small vessel ischemia in the cerebral white matter to a moderate degree. Discrete and chronic appearing infarct at the left putamen and medial right thalamus. Moderate cerebral volume loss which is generalized. Vascular: No hyperdense vessel or unexpected calcification. Skull: Normal. Negative for fracture or focal lesion. Sinuses/Orbits: No acute finding. Other: Prelim sent to Dr. Selina Cooley in epic chat. ASPECTS [facility] Stroke Program Early CT Score) - Ganglionic level infarction (caudate, lentiform nuclei, internal capsule, insula, M1-M3 cortex): 7 - Supraganglionic infarction (M4-M6 cortex): 3 Total score (0-10 with 10 being normal): 10 IMPRESSION: 1. No acute finding. 2. 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 Electronically Signed   By: Tiburcio Pea M.D.   On: 11/08/2022 09:38    Pending Labs Unresulted Labs (From admission, onward)     Start     Ordered   11/09/22 0500  Magnesium  Tomorrow morning,   R        11/08/22 1758   11/08/22 1549  CK  Once,   R        11/08/22 1548   11/08/22 1548  Basic metabolic panel  ONCE - STAT,   STAT        11/08/22 1548   11/08/22 1548  TSH  Once,   R        11/08/22 1548   11/08/22 1440  Lipid panel  (Labs)  Add-on,   AD       Comments: Fasting    11/08/22 1441   11/08/22 1440  Hemoglobin A1c  (Labs)  Add-on,   AD       Comments: To assess prior glycemic control    11/08/22 1441   11/08/22 1436  Vitamin B12  Once,   R        11/08/22 1435            Vitals/Pain Today's Vitals   11/08/22 1415 11/08/22 1430 11/08/22 1600 11/08/22 1745  BP: (!) 182/68 (!) 161/73 (!) 153/64   Pulse: 99 (!) 102 96   Resp: (!) 22 17 16    Temp:    98.1 F (36.7 C)  TempSrc:     Oral  SpO2: 100% 96% 94%   Weight:      Height:      PainSc:        Isolation Precautions No active isolations  Medications Medications   stroke: early stages of recovery book (has no administration in time range)  0.9 %  sodium chloride infusion ( Intravenous New Bag/Given 11/08/22 1815)  acetaminophen (TYLENOL) tablet 650 mg (has no administration in time range)    Or  acetaminophen (TYLENOL) 160 MG/5ML solution 650 mg (has no administration in time range)    Or  acetaminophen (TYLENOL) suppository 650 mg (has no administration in time range)  calcium gluconate 2 g/ 100 mL sodium chloride IVPB (has no administration in time range)  sodium chloride flush (NS) 0.9 % injection 3 mL (3 mLs Intravenous Given 11/08/22 0948)  lactated ringers bolus 1,000 mL (0 mLs Intravenous Stopped 11/08/22 1324)  LORazepam (ATIVAN) tablet 2 mg (2 mg Oral Given 11/08/22 1007)  LORazepam (ATIVAN) injection 0.5 mg (0.5 mg Intravenous Given 11/08/22 1147)  LORazepam (ATIVAN) injection 1  mg (1 mg Intravenous Given 11/08/22 1245)    Mobility non-ambulatory     Focused Assessments Neuro Assessment Handoff:  Swallow screen pass? No  Cardiac Rhythm: Normal sinus rhythm NIH Stroke Scale  Dizziness Present: No Headache Present: No Interval: Shift assessment Level of Consciousness (1a.)   : Alert, keenly responsive LOC Questions (1b. )   : Answers both questions correctly LOC Commands (1c. )   : Performs one task correctly Best Gaze (2. )  : Normal Visual (3. )  : No visual loss Facial Palsy (4. )    : Normal symmetrical movements Motor Arm, Left (5a. )   : Some effort against gravity Motor Arm, Right (5b. ) : No drift Motor Leg, Left (6a. )  : Some effort against gravity Motor Leg, Right (6b. ) : No drift Limb Ataxia (7. ): Present in one limb Sensory (8. )  : Normal, no sensory loss Best Language (9. )  : No aphasia Dysarthria (10. ): Normal Extinction/Inattention (11.)   : No Abnormality Complete  NIHSS TOTAL: 6 Last date known well: 11/07/22 Last time known well: 2100 Neuro Assessment:   Neuro Checks:   Initial (11/08/22 0930)  Has TPA been given? No If patient is a Neuro Trauma and patient is going to OR before floor call report to 4N Charge nurse: [phone] or [phone]   R Recommendations: See Admitting Provider Note  Report given to:   Additional Notes:

## 2022-11-08 NOTE — H&P (Signed)
History and Physical    Patient: Samuel Pearson UJW:[id number] DOB: 1945-10-05 DOA: 11/08/2022 DOS: the patient was seen and examined on 11/08/2022 PCP: Madelin Headings, MD  Patient coming from: Home via EMS  Chief Complaint:  Chief Complaint  Patient presents with   Code Stroke   HPI: Samuel Pearson is a 77 y.o. male with medical history significant of hypertension, heart murmur, anemia, and anxiety who presented with complaints of left-sided weakness.  Patient was noted to last been in his normal state of health when he went to sleep yesterday evening.  Woke up this morning and tried to go to the bathroom and noted to have significant left leg weakness.  At baseline patient has been able to ambulate without need of assistance although had been noted to have somewhat of a shuffling gait per family.  It was reported that he woke up and after trying to get up fell on due to the left-sided weakness.  He reported having pain in his back and had  incontinence of his bowels after falling to the floor.  Patient had been been able to pressure wash yesterday.  There is no report of any significant fever, chest pain, nausea, vomiting, cough, or shortness of breath.  His wife makes note that patient normally drinks 2-3 beers per night and possibly a glass of wine on most nights.  In the emergency department patient was seen as a code stroke.  CT scan of the head did not note any acute abnormality, but did note atrophy and chronic small vessel ischemia including chronic lacunar infarcts at the deep gray nuclei.  Patient was not a candidate for thrombolytics due to being outside of the window.  Patient was noted to be afebrile with pulse 90-102, respirations 13-27, blood pressures elevated up to 182/68, no saturation maintained on room air.  Labs significant for hemoglobin 10.6->9.5, platelets 141, and calcium 8.  Stool guaiacs negative.  Patient underwent MRI of the brain, cervical spine, thoracic, and lumbar  spine.  Significant findings include acute infarcts of the right ACA territory along the sagittal right frontal cortex and right frontal centrum semiovale without hemorrhage.  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  While in the emergency department patient was noted to have a decline which he was noted to be less talkative.   Review of Systems: unable to review all systems due to the inability of the patient to answer questions. Past Medical History:  Diagnosis Date   Allergic rhinitis    Anxiety    Detached retina    Heart murmur    has had one when a child and checked out ok  never? 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  elevated MCV  No other sx    Past Surgical History:  Procedure Laterality Date   CATARACT EXTRACTION  2009   [facility]   CHOLECYSTECTOMY N/A 02/25/2020   Procedure: LAPAROSCOPIC CHOLECYSTECTOMY;  Surgeon: Harriette Bouillon, MD;  Location: [facility] OR;  Service: General;  Laterality: N/A;   Social History:  reports that he has never smoked. He has never used smokeless tobacco. He reports current alcohol use of about 14.0 - 21.0 standard drinks of alcohol per week. He reports  that he does not use drugs.  Allergies  Allergen Reactions   Pollen Extract-Tree Extract [Pollen Extract]     Family History  Problem Relation Age of Onset   Arthritis Mother    Hypertension Mother    Arthritis Father    Hypertension Father     Prior to Admission medications   Medication Sig Start Date End Date Taking? Authorizing Provider  albuterol (VENTOLIN HFA) 108 (90 Base) MCG/ACT inhaler Inhale 2 puffs into the lungs every 6 (six) hours as needed for wheezing. 05/16/22   [provider]  chlorpheniramine (CHLOR-TRIMETON) 4 MG tablet  Take 4 mg by mouth 2 (two) times daily as needed for allergies.    [provider]  Cyanocobalamin (VITAMIN B-12) 1000 MCG SUBL Place 2 tablets (2,000 mcg total) under the tongue daily. 12/23/19   Panosh, Neta Mends, MD  desonide (DESOWEN) 0.05 % ointment Apply 1 Application topically 2 (two) times daily as needed (mild rash flare). Okay to use on the face, neck, groin area. Do not use more than 1 week at a time. 12/13/21   Ellamae Sia, DO  diphenhydrAMINE (BENADRYL) 25 MG tablet Take 25 mg by mouth every 6 (six) hours as needed.    [provider]  lisinopril (ZESTRIL) 20 MG tablet TAKE 1 TABLET BY MOUTH EVERY DAY 08/10/22   Worthy Rancher B, FNP  metoprolol tartrate (LOPRESSOR) 25 MG tablet TAKE 1 TABLET BY MOUTH TWICE A DAY 07/25/22   Worthy Rancher B, FNP  niacin 500 MG tablet Take 500 mg by mouth daily.    [provider]  pimecrolimus (ELIDEL) 1 % cream apply twice a day around the eye 03/17/22   Ambs, Norvel Richards, FNP  prednisoLONE acetate (PRED FORTE) 1 % ophthalmic suspension Place 2 drops into both eyes 4 (four) times daily. 11/26/21   Nelwyn Salisbury, MD  tacrolimus (PROTOPIC) 0.1 % ointment Apply topically 2 (two) times daily. 03/22/22   Kozlow, Alvira Philips, MD  tobramycin-dexamethasone [facility]) ophthalmic solution 1 drop each eye 1 time per day for 5 days only 03/16/22   Jessica Priest, MD    Physical Exam: Vitals:   11/08/22 0942 11/08/22 1300 11/08/22 1315 11/08/22 1351  BP:  138/67    Pulse:  (!) 102 99   Resp:  13 20   Temp: 97.9 F (36.6 C)   98.2 F (36.8 C)  TempSrc: Oral   Oral  SpO2:  94% 95%   Weight:      Height:         Constitutional: Elderly man who appears lethargic, but we will answer simple commands. Eyes: PERRL, lids and conjunctivae normal.  Left-sided neglect. ENMT: Mucous membranes are dry.    Neck: normal, supple,   Respiratory: clear to auscultation bilaterally, no wheezing, no crackles. Normal respiratory effort. No accessory muscle use.   Cardiovascular: Regular rate and rhythm, no murmurs / rubs / gallops. No extremity edema. 2+ pedal pulses. No carotid bruits.  Abdomen: no tenderness, no masses palpated. No hepatosplenomegaly. Bowel sounds positive.  Musculoskeletal: no clubbing / cyanosis. No joint deformity upper and lower extremities. Good ROM, no contractures. Normal muscle tone.  Skin: no rashes, lesions, ulcers. No induration Neurologic: CN 2-12 grossly intact.  Tremor present of the upper and lower extremities.  Strength 2-3/5 in the left lower extremity and strength in the left upper extremity 4/5.  Strength 5/5 in the right upper and lower extremity. Psychiatric: Normal judgment and insight. Alert and oriented x 3.  Normal mood.   Data Reviewed:  EKG reveals sinus rhythm at 94 bpm with PVC and QTc 473.  Reviewed labs, imaging, and pertinent records as noted above in the HPI.  Assessment and Plan:  CVA Acute.  Patient present with complaints of left-sided weakness upon waking up this morning.  MRI of the brain significant for acute infarcts in the right ACA territory along the parasagittal right frontal cortex and in the right frontal centrum semiovale.  Patient had been on a daily aspirin. -Admit to a telemetry bed -Stroke order set utilized -Neurochecks -Follow-up lipid panel -Follow-up echocardiogram -PT/OT/speech to evaluate and treat -Appreciate neurology consultative services, will follow-up for any further recommendations. -Follow-up EEG and repeat CT scan of brain  Essential hypertension Initial blood pressure elevated up to 192/68.  Home blood pressure regimen includes metoprolol 25 mg twice daily and lisinopril 20 mg daily. -Allowing for permissive hypertension at this time -Determine when medically appropriate to resume home blood pressure regimen  Macrocytic anemia Acute.  Hemoglobin 10.6-> 9.5.  Hemoglobin previously have been within normal limits at around 14.  Stool guaiacs were noted to be  negative.  Likely secondary to patient's history of alcohol use. -Check vitamin B12 and folate -Recheck CBC tomorrow morning.  Hypocalcemia Acute.  Calcium noted to be 8. -Calcium gluconate 2 g IV -Continue to monitor and replace as needed  Thrombocytopenia Acute.  Platelet count 141.   -Recheck CBC tomorrow morning  Hyperlipidemia Last LDL noted to be 116 on 01/18/2022. -Goal LDL less than 70 -Follow-up lipid panel -Consider starting high intensity statin  Alcohol use Chronic.  Patient's wife makes note that he normally drinks 2-3 beers +/- a glass of wine on most nights.  Liver enzymes noted to be within normal limits. 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   DVT prophylaxis: SCDs Advance Care Planning:   Code Status: Full Code    Consults: Neurology  Family Communication: Family updated bedside  Severity of Illness: The appropriate patient status for this patient is INPATIENT. Inpatient status is judged to be reasonable and necessary in order to provide the required intensity of service to ensure the patient's safety. The patient's presenting symptoms, physical exam findings, and initial radiographic and laboratory data in the context of their chronic comorbidities is felt to place them at high risk for further clinical deterioration. Furthermore, it is not anticipated that the patient will be medically stable for discharge from the hospital within 2 midnights of admission.   * I certify that at the point of admission it is my clinical judgment that the patient will require inpatient hospital care spanning beyond 2 midnights from the point of admission due to high intensity of service, high risk for further deterioration and high frequency of surveillance  required.*  Author: Clydie Braun, MD 11/08/2022 2:27 PM  For on call review [email or web address].

## 2022-11-08 NOTE — ED Notes (Signed)
Pt arrived to room 37. Changed pt brief. Pt stated he had to pee but was unable to use urine and had already wet brief. Wife and daughter in room. Neuro NP at bedside. Call bell in reach.

## 2022-11-09 ENCOUNTER — Inpatient Hospital Stay (HOSPITAL_COMMUNITY): Payer: Medicare Other

## 2022-11-09 DIAGNOSIS — D539 Nutritional anemia, unspecified: Secondary | ICD-10-CM | POA: Diagnosis not present

## 2022-11-09 DIAGNOSIS — I63521 Cerebral infarction due to unspecified occlusion or stenosis of right anterior cerebral artery: Secondary | ICD-10-CM

## 2022-11-09 DIAGNOSIS — D696 Thrombocytopenia, unspecified: Secondary | ICD-10-CM | POA: Diagnosis not present

## 2022-11-09 DIAGNOSIS — R569 Unspecified convulsions: Secondary | ICD-10-CM

## 2022-11-09 DIAGNOSIS — M4802 Spinal stenosis, cervical region: Secondary | ICD-10-CM

## 2022-11-09 DIAGNOSIS — I6300 Cerebral infarction due to thrombosis of unspecified precerebral artery: Secondary | ICD-10-CM

## 2022-11-09 LAB — MAGNESIUM: Magnesium: 1.9 mg/dL (ref 1.7–2.4)

## 2022-11-09 MED ORDER — CYANOCOBALAMIN 1000 MCG/ML IJ SOLN
1000.0000 ug | Freq: Once | INTRAMUSCULAR | Status: AC
  Administered 2022-11-09: 1000 ug via INTRAMUSCULAR
  Filled 2022-11-09: qty 1

## 2022-11-09 MED ORDER — ATORVASTATIN CALCIUM 40 MG PO TABS
40.0000 mg | ORAL_TABLET | Freq: Every day | ORAL | Status: DC
  Administered 2022-11-09 – 2022-11-14 (×6): 40 mg via ORAL
  Filled 2022-11-09 (×6): qty 1

## 2022-11-09 MED ORDER — POLYETHYL GLYCOL-PROPYL GLYCOL 0.4-0.3 % OP GEL: 1.0000 | Freq: Every day | OPHTHALMIC | Status: DC | PRN

## 2022-11-09 MED ORDER — DEXTROSE-SODIUM CHLORIDE 5-0.9 % IV SOLN: INTRAVENOUS | Status: DC

## 2022-11-09 MED ORDER — IOHEXOL 350 MG/ML SOLN
75.0000 mL | Freq: Once | INTRAVENOUS | Status: AC | PRN
  Administered 2022-11-09: 75 mL via INTRAVENOUS

## 2022-11-09 MED ORDER — VITAMIN B-12 1000 MCG PO TABS
1000.0000 ug | ORAL_TABLET | Freq: Every day | ORAL | Status: DC
  Administered 2022-11-10 – 2022-11-14 (×5): 1000 ug via ORAL
  Filled 2022-11-09 (×5): qty 1

## 2022-11-09 NOTE — Procedures (Signed)
Patient Name: EUAL GUIDICE  MRN: [id number]  Epilepsy Attending: Charlsie Quest  Referring Physician/Provider: Elmer Picker, NP  Date: 11/08/2022 Duration: 25.04 mins  Patient history:  77 y.o. male with a past medical history of antexity, heart murmur, HTN, anemia presenting with left leg weakness. EEG to evaluate for seizure.  Level of alertness: Awake  AEDs during EEG study: Ativan  Technical aspects: This EEG study was done with scalp electrodes positioned according to the 10-20 International system of electrode placement. Electrical activity was reviewed with band pass filter of 1-70Hz , sensitivity of 7 uV/mm, display speed of 27mm/sec with a 60Hz  notched filter applied as appropriate. EEG data were recorded continuously and digitally stored.  Video monitoring was available and reviewed as appropriate.  Description: The posterior dominant rhythm consists of 8-9 Hz activity of moderate voltage (25-35 uV) seen predominantly in posterior head regions, symmetric and reactive to eye opening and eye closing. There is an excessive amount of 15 to 18 Hz beta activity distributed symmetrically and diffusely. Hyperventilation and photic stimulation were not performed.     ABNORMALITY - Excessive beta, generalized  IMPRESSION: This study is within normal limits. The excessive beta activity seen in the background is most likely due to the effect of benzodiazepine and is a benign EEG pattern. No seizures or epileptiform discharges were seen throughout the recording.  A normal interictal EEG does not exclude the diagnosis of epilepsy.  Macall Mccroskey Annabelle Harman

## 2022-11-09 NOTE — Progress Notes (Signed)
PROGRESS NOTE  Samuel Pearson:[id number] DOB: Sep 10, 1945   PCP: Madelin Headings, MD  Patient is from: Home  DOA: 11/08/2022 LOS: 1  Chief complaints Chief Complaint  Patient presents with   Code Stroke     Brief Narrative / Interim history: 77 year old M with PMH of HTN, anxiety, anemia, heart murmur and EtOH abuse presenting with left-sided weakness and found to have acute CVA and right ACA territory as noted on MRI brain.  MRI cervical spine showed severe C3-4 and C4-5 spinal canal stenosis with possible cord signal abnormality concerning for myelomalacia.  Neurology consulted.  Patient admitted for further CVA workup.    Subjective: Seen and examined earlier this morning.  Patient was very somnolent and difficult to arouse even with sternal rub.  He was given IV Ativan about 2 hours prior.  Patient's wife at bedside.  Objective: Vitals:   11/08/22 2351 11/09/22 0351 11/09/22 1229 11/09/22 1530  BP: (!) 141/56 (!) 149/57 (!) 142/58 (!) 163/73  Pulse: 84 75 79 91  Resp: 17 17 18 16   Temp:  98.1 F (36.7 C) 99 F (37.2 C) 98.6 F (37 C)  TempSrc: Oral Oral Axillary Axillary  SpO2: 91% 98% 98% 97%  Weight:      Height:        Examination:  GENERAL: No apparent distress.  Nontoxic. HEENT: MMM.  Vision and hearing grossly intact.  NECK: Supple.  No apparent JVD.  RESP:  No IWOB.  Fair aeration bilaterally. CVS:  RRR. Heart sounds normal.  ABD/GI/GU: BS+. Abd soft, NTND.  MSK/EXT:  Moves extremities. No apparent deformity. No edema.  SKIN: no apparent skin lesion or wound NEURO: Very somnolent.  Difficult to arouse even with sternal rub.  No facial asymmetry.  No apparent focal neuro deficit but limited exam due to mental status.Marland Kitchen PSYCH: Calm. Normal affect.   Procedures:  None  Microbiology summarized: None  Assessment and plan: Principal Problem:   CVA (cerebral vascular accident) [place] [facility]) Active Problems:   Essential hypertension   Macrocytic anemia    Hypocalcemia   Thrombocytopenia (HCC)   Alcohol use   Spinal stenosis  Acute CVA: Presents with left-sided weakness.  MRI brain showed acute CVA and right ACA territory.  Outside tPA window on arrival.  CT angio head negative for LVO but moderate to severe multivessel stenosis bilaterally.  TTE without significant finding.  LDL 130.  A1c 5.7%.  Patient has been on daily aspirin. -Follow neurology recommendation -Continue Lipitor. -PT/OT/SLP eval  Acute metabolic encephalopathy: Patient is quite somnolent and difficult to arouse.  Likely due to Ativan he received for alcohol withdrawal.  EEG negative for seizure. -Discontinue CIWA and Ativan -Continue thiamine, multivitamin and folic acid -Vitamin B12 injection followed by p.o. -Reorientation and delirium precautions.  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  He is very somnolent for neuroexam. -Reassess and consult neurosurgery if appropriate.  Alcohol use: Reportedly drinks 2-3 beers plus glass of wine most nights.  He was on IV Ativan and became somnolent -Monitor off IV Ativan. -Continue multivitamin, thiamine and folic acid -Encourage moderation or cessation.   Essential hypertension -Permissive hypertension given multiple intracranial stenosis   Macrocytic anemia Vitamin B12 deficiency Recent Labs    01/18/22 1055 11/08/22 0916 11/08/22 0931  HGB 14.5 10.6* 9.5*  -Vitamin B12 injection followed by p.o.   Hypocalcemia: Received IV calcium gluconate in ED   Thrombocytopenia: Platelet 141 -Recheck in the  morning   Hyperlipidemia: LDL 130 -Statin as above    Body mass index is 25.82 kg/m.           DVT prophylaxis:  SCD's Start: 11/08/22 1439  Code Status: Full code Family Communication: None at bedside Level of care: Telemetry Medical Status is: Inpatient Remains inpatient appropriate because: Acute CVA, acute metabolic  encephalopathy   Final disposition: TBD Consultants:  Neurology  55 minutes with more than 50% spent in reviewing records, counseling patient/family and coordinating care.   Sch Meds:  Scheduled Meds:   stroke: early stages of recovery book   Does not apply Once   atorvastatin  40 mg Oral Daily   [START ON 11/10/2022] vitamin B-12  1,000 mcg Oral Daily   multivitamin with minerals  1 tablet Oral Daily   thiamine  100 mg Oral Daily   Or   thiamine  100 mg Intravenous Daily   Continuous Infusions:  dextrose 5 % and 0.9 % NaCl 75 mL/hr at 11/09/22 1059   PRN Meds:.acetaminophen **OR** acetaminophen (TYLENOL) oral liquid 160 mg/5 mL **OR** acetaminophen, polyvinyl alcohol  Antimicrobials: Anti-infectives (From admission, onward)    None        I have personally reviewed the following labs and images: CBC: Recent Labs  Lab 11/08/22 0916 11/08/22 0931  WBC 8.3  --   NEUTROABS 6.8  --   HGB 10.6* 9.5*  HCT 32.7* 28.0*  MCV 103.8*  --   PLT 141*  --    BMP &GFR Recent Labs  Lab 11/08/22 0916 11/08/22 0931 11/08/22 2103 11/09/22 0424  NA 138 147* 136  --   K 3.9 2.8* 3.7  --   CL 110 115* 106  --   CO2 17*  --  20*  --   GLUCOSE 114* 91 130*  --   BUN 13 12 12   --   CREATININE 0.79 0.40* 0.77  --   CALCIUM 8.0*  --  8.5*  --   MG  --   --   --  1.9   Estimated Creatinine Clearance: 68.7 mL/min (by C-G formula based on SCr of 0.77 mg/dL). Liver & Pancreas: Recent Labs  Lab 11/08/22 0916  AST 29  ALT 20  ALKPHOS 47  BILITOT 1.0  PROT 6.0*  ALBUMIN 3.5   No results for input(s): "LIPASE", "AMYLASE" in the last 168 hours. No results for input(s): "AMMONIA" in the last 168 hours. Diabetic: Recent Labs    11/08/22 2103  HGBA1C 5.7*   Recent Labs  Lab 11/08/22 0921  GLUCAP 79   Cardiac Enzymes: Recent Labs  Lab 11/08/22 2103  CKTOTAL 606*   No results for input(s): "PROBNP" in the last 8760 hours. Coagulation Profile: Recent Labs  Lab  11/08/22 0916  INR 1.2   Thyroid Function Tests: Recent Labs    11/08/22 2103  TSH 5.929*   Lipid Profile: Recent Labs    11/08/22 2103  CHOL 183  HDL 39*  LDLCALC 130*  TRIG 70  CHOLHDL 4.7   Anemia Panel: Recent Labs    11/08/22 2103  VITAMINB12 204   Urine analysis:    Component Value Date/Time   COLORURINE YELLOW 02/25/2020 0200   APPEARANCEUR CLEAR 02/25/2020 0200   LABSPEC 1.025 02/25/2020 0200   PHURINE 5.0 02/25/2020 0200   GLUCOSEU NEGATIVE 02/25/2020 0200   HGBUR NEGATIVE 02/25/2020 0200   HGBUR trace-intact 04/13/2009 0855   BILIRUBINUR NEGATIVE 02/25/2020 0200   BILIRUBINUR n 11/23/2011 0836   KETONESUR  20 (A) 02/25/2020 0200   PROTEINUR 30 (A) 02/25/2020 0200   UROBILINOGEN 0.2 11/23/2011 0836   UROBILINOGEN 0.2 04/13/2009 0855   NITRITE NEGATIVE 02/25/2020 0200   LEUKOCYTESUR NEGATIVE 02/25/2020 0200   Sepsis Labs: Invalid input(s): "PROCALCITONIN", "LACTICIDVEN"  Microbiology: No results found for this or any previous visit (from the past 240 hour(s)).  Radiology Studies: CT ANGIO HEAD NECK W WO CM  Result Date: 11/09/2022 CLINICAL DATA:  Provided history: Neuro deficit, acute, stroke suspected. EXAM: CT ANGIOGRAPHY HEAD AND NECK WITH AND WITHOUT CONTRAST TECHNIQUE: Multidetector CT imaging of the head and neck was performed using the standard protocol during bolus administration of intravenous contrast. Multiplanar CT image reconstructions and MIPs were obtained to evaluate the vascular anatomy. Carotid stenosis measurements (when applicable) are obtained utilizing NASCET criteria, using the distal internal carotid diameter as the denominator. RADIATION DOSE REDUCTION: This exam was performed according to the departmental dose-optimization program which includes automated exposure control, adjustment of the mA and/or kV according to patient size and/or use of iterative reconstruction technique. CONTRAST:  75mL OMNIPAQUE IOHEXOL 350 MG/ML SOLN  COMPARISON:  Head CT 11/08/2022.  Brain MRI 11/08/2022. FINDINGS: CT HEAD FINDINGS Brain: Generalized cerebral atrophy. Known small acute infarcts within the right frontal lobe were better appreciated on the prior brain MRI of 11/08/2022. Background moderate patchy and ill-defined hypoattenuation within the cerebral white matter, nonspecific but compatible with chronic small ischemic disease. Redemonstrated prominent perivascular space or chronic lacunar infarct within the left basal ganglia. There is no acute intracranial hemorrhage. No extra-axial fluid collection. No evidence of an intracranial mass. No midline shift. Vascular: No hyperdense vessel.  Atherosclerotic calcifications. Skull: No calvarial fracture or aggressive osseous lesion. Sinuses/Orbits: No orbital mass or acute orbital finding. Minimal mucosal thickening, and small mucous retention cyst, within the left maxillary sinus. Small mucous retention cyst within the left sphenoid sinus. Review of the MIP images confirms the above findings CTA NECK FINDINGS Aortic arch: Standard aortic branching. Atherosclerotic plaque within the visualized aortic arch and proximal major branch vessels of the neck. No hemodynamically significant innominate or proximal subclavian artery stenosis. Right carotid system: CCA and ICA patent within the neck. Atherosclerotic plaque about the carotid bifurcation and within the proximal ICA. Less than 50% stenosis of the proximal ICA. Tortuosity of the cervical ICA. Left carotid system: CCA and ICA patent within the neck without stenosis. Minimal atherosclerotic plaque of the carotid bifurcation. Vertebral arteries: Vertebral arteries patent within the neck. The right vertebral artery is dominant. Mild non-stenotic atherosclerotic plaque within the right vertebral artery at the V3/V4 junction Skeleton: Reversal of the expected cervical lordosis. Slight C2-C3 grade 1 anterolisthesis. 4 mm C3-C4 grade 1 retrolisthesis. 2 mm C7-T1  grade 1 anterolisthesis. Advanced cervical spondylosis. Probable vertebral body ankylosis at C5-C6. Other neck: 2.4 cm right thyroid lobe nodule. Upper chest: No consolidation within the imaged lung apices. Review of the MIP images confirms the above findings CTA HEAD FINDINGS Anterior circulation: The intracranial internal carotid arteries are patent. Atherosclerotic plaque within both vessels. Mild stenosis of the paraclinoid right ICA. Up to moderate stenosis of the paraclinoid left ICA. The M1 middle cerebral arteries are patent. Atherosclerotic irregularity of the M2 and more distal MCA vessels bilaterally. Most notably, there are multiple sites of severe stenosis within a mid M2 left MCA vessel (series 16, image 35). No M2 proximal branch occlusion is identified. The anterior cerebral arteries are patent. Atherosclerotic irregularity of both vessels without high-grade proximal stenosis. The left A1 segment mentally  absent. No intracranial aneurysm is identified. Posterior circulation: The intracranial vertebral arteries are patent. Mild nonstenotic atherosclerotic plaque within the right vertebral artery at the V3/V4 junction. The basilar artery is patent. The posterior cerebral arteries are patent. Atherosclerotic irregularity of both vessels. Most notably, there is a moderate/severe stenosis within the right PCA P2 segment, a moderate stenosis within the left PCA P2 segment and sites of severe stenoses within a left PCA branch at the P4 segment level. Posterior communicating arteries are diminutive or absent bilaterally. Venous sinuses: The dural venous sinuses are poorly assessed due to contrast timing. Anatomic variants: As described. Review of the MIP images confirms the above findings IMPRESSION: CT head: 1. Known small acute infarcts within the right frontal lobe were better appreciated on the prior brain MRI of 11/08/2022. 2. Background parenchymal atrophy and chronic small vessel disease as described.  3. Mild paranasal sinus disease. CTA neck: 1. The common carotid and internal carotid arteries are patent within the neck. Atherosclerotic plaque about both carotid bifurcations and within the proximal right ICA. Less than 50% stenosis of the proximal right ICA. No cervical ICA stenosis on the left. 2. The vertebral arteries are patent within the neck. Mild non-stenotic atherosclerotic plaque within the dominant right vertebral artery at the V3/V4 junction. 3. 2.4 cm right thyroid lobe nodule. A non-emergent thyroid ultrasound is recommended for further evaluation. Reference: J Am Coll Radiol. 2015 Feb;12(2): 143-50. 4. Advanced cervical spondylosis. Probable vertebral body ankylosis at C5-C6. CTA head: 1. No intracranial large vessel occlusion is identified. 2. Intracranial atherosclerotic disease with multifocal stenoses, most notably as follows. 3. Up to moderate stenosis of the paraclinoid left ICA. 4. Sites of severe stenosis within a mid M2 left MCA vessel. 5. Moderate/severe stenosis within the right PCA P2 segment. 6. Moderate stenosis within the left PCA P2 segment. 7. Severe stenoses within a left PCA branch at the P4 segment level. Electronically Signed   By: Jackey Loge D.O.   On: 11/09/2022 10:45   EEG adult  Result Date: 11/09/2022 Charlsie Quest, MD     11/09/2022 10:05 AM Patient Name: Samuel Pearson MRN: [id number] Epilepsy Attending: Charlsie Quest Referring Physician/Provider: Elmer Picker, NP Date: 11/08/2022 Duration: 25.04 mins Patient history:  77 y.o. male with a past medical history of antexity, heart murmur, HTN, anemia presenting with left leg weakness. EEG to evaluate for seizure. Level of alertness: Awake AEDs during EEG study: Ativan Technical aspects: This EEG study was done with scalp electrodes positioned according to the 10-20 International system of electrode placement. Electrical activity was reviewed with band pass filter of 1-70Hz , sensitivity of 7 uV/mm, display speed of  39mm/sec with a 60Hz  notched filter applied as appropriate. EEG data were recorded continuously and digitally stored.  Video monitoring was available and reviewed as appropriate. Description: The posterior dominant rhythm consists of 8-9 Hz activity of moderate voltage (25-35 uV) seen predominantly in posterior head regions, symmetric and reactive to eye opening and eye closing. There is an excessive amount of 15 to 18 Hz beta activity distributed symmetrically and diffusely. Hyperventilation and photic stimulation were not performed.   ABNORMALITY - Excessive beta, generalized IMPRESSION: This study is within normal limits. The excessive beta activity seen in the background is most likely due to the effect of benzodiazepine and is a benign EEG pattern. No seizures or epileptiform discharges were seen throughout the recording. A normal interictal EEG does not exclude the diagnosis of epilepsy. Charlsie Quest   CT  HEAD WO CONTRAST ( )  Result Date: 11/08/2022 CLINICAL DATA:  Neuro deficit, acute, stroke suspected EXAM: CT HEAD WITHOUT CONTRAST TECHNIQUE: Contiguous axial images were obtained from the base of the skull through the vertex without intravenous contrast. RADIATION DOSE REDUCTION: This exam was performed according to the departmental dose-optimization program which includes automated exposure control, adjustment of the mA and/or kV according to patient size and/or use of iterative reconstruction technique. COMPARISON:  11/08/2022 MRI and CT FINDINGS: Brain: There is atrophy and chronic small vessel disease changes. Previously seen small acute infarcts in the right medial frontal lobe and right centrum semiovale not appreciable by CT. No visible acute infarction or hemorrhage. No hydrocephalus. Vascular: No hyperdense vessel or unexpected calcification. Skull: No acute calvarial abnormality. Sinuses/Orbits: No acute findings Other: None IMPRESSION: Previously seen right frontal and centrum semiovale  acute infarcts by MRI not appreciable by CT. No hemorrhage. Atrophy, chronic microvascular disease. Electronically Signed   By: Charlett Nose M.D.   On: 11/08/2022 19:33      Malyna Budney T. Jordin Dambrosio [facility]  If 7PM-7AM, please contact night-coverage [email or web address] 11/09/2022, 3:47 PM

## 2022-11-09 NOTE — Consult Note (Signed)
ELECTROPHYSIOLOGY CONSULT NOTE  Patient ID: Samuel Pearson MRN: [id number], DOB/AGE: 78-May-1947   Admit date: 11/08/2022 Date of Consult: 11/14/2022  Primary Physician: Madelin Headings, MD Primary Cardiologist: None  Primary Electrophysiologist: New to None  Reason for Consultation: Cryptogenic stroke; recommendations regarding Implantable Loop Recorder Insurance: [employer] / [facility] Medicare  History of Present Illness: 77 y/o M with PMH of daily ETOH use, anxiety, anemia, murmur, HTN who presented to [facility] ER on 7/2 with LLE weakness, back pain and bowel incontinence. He went to bed in his usual state of health on 7/1. He woke am of 7/2 with pain and weakness.  He fell while trying to get to the bathroom. Pt was noted to have some degree of confusion as pt thought he had power washed all day and family denied this activity. AM of presentation, he called for his wife to assist him in the bathroom and family spent approximately 2.5h trying to get him up but ultimately had to call EMS. ER evaluation was notable for LLE weakness.  Imaging notable for R ACA territory acute infarcts explaining his LLE weakness.     EP has been asked to evaluate Samuel Pearson for placement of an implantable loop recorder to monitor for atrial fibrillation by Dr  Selina Cooley .     He has undergone workup for stroke including:   CT-head- No acute finding. 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 MRI brain Acute infarcts in the right ACA territory along the parasagittal right frontal cortex and in the right frontal centrum semiovale. No hemorrhage. MRI Cervical Spine > markedly limited due to the degree of motion artifact. 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 MRI Lumbar Spine >  markedly limited due to the degree of motion artifact. Within this limitation, there is moderate to severe spinal canal narrowing at L3-L4 and L4-L5 and severe right-sided neural foraminal narrowing at L4-L5. MRI Thoracic Spine > limited due to a combination of motion artifact on the sagittal sequences and lack of axial T2 weighted sequences within this limitation, no definite evidence of high grade spinal canal stenosis or cord signal abnormality. EEG > within normal limits. No seizures or epileptiform discharges on recording.  ECHO > LVEF 60-65%, no RWMA, LA moderately dilated, trivial MVR, mild calcification of AV no stenosis Carotid Doppler >   The patient has been monitored on telemetry which has demonstrated sinus rhythm with no arrhythmias.  Inpatient stroke work-up will not require a TEE per Neurology.  Lab work is reviewed.  Prior to admission, the patient denies having chest pain, shortness of breath, dizziness, palpitations, or syncope.  He is recovering from his stroke with disposition plan for CIR.     Allergies, Past Medical, Surgical, Social, and Family Histories have been reviewed and are referenced here-in when relevant for medical decision making.   Inpatient Medications:   amLODipine  5 mg Oral Daily   aspirin EC  81 mg Oral Daily   atorvastatin  40 mg Oral Daily   clopidogrel  75 mg Oral Daily   vitamin B-12  1,000 mcg Oral Daily   multivitamin with minerals  1 tablet Oral Daily   pantoprazole  40 mg Oral Daily   simethicone  80 mg Oral QID   thiamine  100 mg  Oral Daily   Or   thiamine  100 mg Intravenous Daily    Physical Exam: Vitals:   11/13/22 2329 11/14/22 0414 11/14/22 0741 11/14/22 1101  BP: (!) 186/49 (!) 165/50 (!) 158/47 (!) 166/54  Pulse: 65 62 63 71  Resp: 20 18 18 18   Temp: 98.6 F (37 C) 97.8 F (36.6 C) 98.1 F (36.7 C) 98.9 F (37.2 C)  TempSrc: Oral Oral Oral Oral  SpO2: 96% 96% 96% 96%  Weight:      Height:        GEN- NAD. A&O x 3.  Normal affect. HEENT: Normocephalic, atraumatic Lungs- CTAB, Normal effort.  Heart- Regular rate and rhythm rate and rhythm. No M/G/R.  Extremities- No peripheral edema. no clubbing or cyanosis Skin- warm and dry, no rash or lesion. Neuro: drowsy but awakens to voice, appropriate, states he heard discussion with wife regarding the procedure and understands   12-lead ECG: NSR, rate 94, PVC  (personally reviewed) All prior EKG's in EPIC reviewed with no documented atrial fibrillation  Telemetry SR 60-70's, no ectopy (personally reviewed)  Assessment and Plan:  1. Cryptogenic Stroke The patient presents with cryptogenic stroke.  The patient does not have a TEE planned for this AM.  I spoke at length with the patient about monitoring for afib with an implantable loop recorder.  Risks, benefits, and alteratives to implantable loop recorder were discussed with the patient today.   At this time, the patient is very clear in their decision to proceed with implantable loop recorder.     Wound care was reviewed with the patient (keep incision clean and dry for 3 days). Please call with questions.    Canary Brim, MSN, APRN, NP-C, AGACNP-BC [facility] - Electrophysiology  11/14/2022, 12:35 PM

## 2022-11-09 NOTE — TOC Initial Note (Signed)
Transition of Care [facility]) - Initial/Assessment Note    Patient Details  Name: Samuel Pearson MRN: [id number] Date of Birth: [birth year]  Transition of Care [facility]) CM/SW Contact:    Kermit Balo, RN Phone Number: 11/09/2022, 4:39 PM  Clinical Narrative:                 CM met with the patient and his spouse. Pt slept through the visit. Wife says she is with him most of the time and can provide needed support at home. No DME at home.  Both were driving prior to admission. Pt managed his own medications at home and wife denied any issues. CIR recommended and will follow to see if he gets to a level to tolerate CIR.  TOC following.  Expected Discharge Plan: IP Rehab Facility Barriers to Discharge: Continued Medical Work up   Patient Goals and CMS Choice   CMS Medicare.gov Compare Post Acute Care list provided to:: Patient Represenative (must comment) Choice offered to / list presented to : Spouse      Expected Discharge Plan and Services   Discharge Planning Services: CM Consult Post Acute Care Choice: IP Rehab Living arrangements for the past 2 months: Single Family Home                                      Prior Living Arrangements/Services Living arrangements for the past 2 months: Single Family Home Lives with:: Spouse Patient language and need for interpreter reviewed:: Yes          Care giver support system in place?: Yes (comment)   Criminal Activity/Legal Involvement Pertinent to Current Situation/Hospitalization: No - Comment as needed  Activities of Daily Living Home Assistive Devices/Equipment: None ADL Screening (condition at time of admission) Patient's cognitive ability adequate to safely complete daily activities?: No Is the patient deaf or have difficulty hearing?: No Does the patient have difficulty seeing, even when wearing glasses/contacts?: No Does the patient have difficulty concentrating, remembering, or making decisions?: Yes Patient  able to express need for assistance with ADLs?: Yes Does the patient have difficulty dressing or bathing?: Yes Independently performs ADLs?: No Dressing (OT): Needs assistance Is this a change from baseline?: Change from baseline, expected to last >3 days Grooming: Needs assistance Is this a change from baseline?: Change from baseline, expected to last >3 days Feeding: Needs assistance Is this a change from baseline?: Change from baseline, expected to last >3 days Bathing: Needs assistance Is this a change from baseline?: Change from baseline, expected to last >3 days Toileting: Needs assistance Is this a change from baseline?: Change from baseline, expected to last >3days In/Out Bed: Needs assistance Is this a change from baseline?: Change from baseline, expected to last >3 days Walks in Home: Needs assistance Is this a change from baseline?: Change from baseline, expected to last >3 days Does the patient have difficulty walking or climbing stairs?: Yes Weakness of Legs: Left Weakness of Arms/Hands: Left  Permission Sought/Granted                  Emotional Assessment Appearance:: Appears stated age       Alcohol / Substance Use: Alcohol Use Psych Involvement: No (comment)  Admission diagnosis:  CVA (cerebral vascular accident) (HCC) [I63.9] Spinal stenosis, unspecified spinal region [M48.00] Cerebrovascular accident (CVA), unspecified mechanism (HCC) [I63.9] Patient Active Problem List   Diagnosis Date Noted  CVA (cerebral vascular accident) (HCC) 11/08/2022   Macrocytic anemia 11/08/2022   Hypocalcemia 11/08/2022   Thrombocytopenia (HCC) 11/08/2022   Alcohol use 11/08/2022   Spinal stenosis 11/08/2022   Contact dermatitis due to chemicals 03/17/2022   Aortic valve regurgitation 01/18/2022   Periorbital dermatitis 12/13/2021   Food intolerance 12/13/2021   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   Acute cholecystitis 02/25/2020   Leukocytosis 02/25/2020   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   Essential hypertension 05/17/2014   Low HDL (under 40) 05/17/2014   Fasting hyperglycemia 05/17/2014   Colon cancer screening 03/18/2013   HTN, white coat 03/18/2013   Medication management 12/03/2011   Heart murmur 12/03/2011   Dermatochalasis of eyelid 09/14/2011   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 AT [address] OF [facility] [address] [place] [address] Phone: [phone] Fax: [phone]     Social Determinants of Health (SDOH) Social History: SDOH Screenings   Food Insecurity: No Food Insecurity (11/08/2022)  Housing: Low Risk  (11/08/2022)  Transportation Needs: No Transportation Needs (11/08/2022)  Utilities: Not At Risk (11/08/2022)  Depression (PHQ2-9): Low Risk  (01/18/2022)  Tobacco Use: Low Risk  (11/08/2022)   SDOH Interventions:     Readmission Risk Interventions     No data to display

## 2022-11-09 NOTE — Progress Notes (Signed)
Inpatient Rehab Admissions Coordinator:   Per therapy recommendations, patient was screened for CIR candidacy by Kee Drudge, MS, CCC-SLP. At this time, Pt. is not yet at a level to tolerate the intensity of CIR; however,   Pt. may have potential to progress to becoming a potential CIR candidate, so CIR admissions team will follow and monitor for progress and participation with therapies and place consult order if Pt. appears to be an appropriate candidate. Please contact me with any questions.   Sherrick Araki, MS, CCC-SLP Rehab Admissions Coordinator  [phone] (celll) [phone] (office)  

## 2022-11-09 NOTE — Progress Notes (Addendum)
STROKE TEAM PROGRESS NOTE   BRIEF HPI [name] is a 77 y.o. male with history of anxiety, heart murmur, HTN, anemia presenting with left leg weakness. Patient presents from home reporting left leg weakness. 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    SIGNIFICANT HOSPITAL EVENTS 7/2- presented as a code stroke  INTERIM HISTORY/SUBJECTIVE Plan for loop recorder at discharge. Currently on CIWA protocol, consider adding klonopin.  Daughter states that patient has a lot of underlying anxiety and may have tremors related to that  OBJECTIVE  CBC    Component Value Date/Time   WBC 8.3 11/08/2022 0916   RBC 3.15 (L) 11/08/2022 0916   HGB 9.5 (L) 11/08/2022 0931   HCT 28.0 (L) 11/08/2022 0931   PLT 141 (L) 11/08/2022 0916   MCV 103.8 (H) 11/08/2022 0916   MCH 33.7 11/08/2022 0916   MCHC 32.4 11/08/2022 0916   RDW 12.6 11/08/2022 0916   LYMPHSABS 0.7 11/08/2022 0916   MONOABS 0.8 11/08/2022 0916   EOSABS 0.0 11/08/2022 0916   BASOSABS 0.0 11/08/2022 0916    BMET    Component Value Date/Time   NA 136 11/08/2022 2103   K 3.7 11/08/2022 2103   CL 106 11/08/2022 2103   CO2 20 (L) 11/08/2022 2103   GLUCOSE 130 (H) 11/08/2022 2103   BUN 12 11/08/2022 2103   CREATININE 0.77 11/08/2022 2103   CALCIUM 8.5 (L) 11/08/2022 2103   GFRNONAA >60 11/08/2022 2103    IMAGING past 24 hours CT HEAD WO CONTRAST ( )  Result Date: 11/08/2022 CLINICAL DATA:  Neuro deficit, acute, stroke suspected EXAM: CT HEAD WITHOUT CONTRAST TECHNIQUE: Contiguous axial images were obtained from the base of the skull through the vertex without intravenous contrast. RADIATION DOSE REDUCTION: This exam was performed according to the departmental dose-optimization program which includes automated exposure control, adjustment of the mA and/or kV according to patient size and/or use of iterative reconstruction technique. COMPARISON:  11/08/2022 MRI and CT  FINDINGS: Brain: There is atrophy and chronic small vessel disease changes. Previously seen small acute infarcts in the right medial frontal lobe and right centrum semiovale not appreciable by CT. No visible acute infarction or hemorrhage. No hydrocephalus. Vascular: No hyperdense vessel or unexpected calcification. Skull: No acute calvarial abnormality. 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 No hemorrhage. Atrophy, chronic microvascular disease. Electronically Signed   By: Charlett Nose M.D.   On: 11/08/2022 19:33   ECHOCARDIOGRAM COMPLETE  Result Date: 11/08/2022    ECHOCARDIOGRAM REPORT   Patient Name:   Samuel Pearson Date of Exam: 11/08/2022 Medical Rec #:  [id number]        Height:       66.0 in Accession #:    [id number]       Weight:       160.0 lb Date of Birth:  [birth year]        BSA:          1.819 m Patient Age:    77 years         BP:           161/73 mmHg Patient Gender: M                HR:           100 bpm. Exam Location:  Inpatient Procedure: 2D Echo, Cardiac Doppler and  Color Doppler Indications:    Stroke I63.9  History:        Patient has prior history of Echocardiogram examinations, most                 recent 02/25/2020. Stroke; Risk Factors:Hypertension.  Sonographer:    Lucendia Herrlich Referring Phys: [id number] RONDELL A SMITH IMPRESSIONS  1. Left ventricular ejection fraction, by estimation, is 60 to 65%. The left ventricle has normal function. The left ventricle has no regional wall motion abnormalities. Left ventricular diastolic parameters are indeterminate.  2. Right ventricular systolic function is normal. The right ventricular size is normal.  3. Left atrial size was moderately dilated.  4. The mitral valve is normal in structure. Trivial mitral valve regurgitation. No evidence of mitral stenosis. Moderate mitral annular calcification.  5. The aortic valve is tricuspid.  There is mild calcification of the aortic valve. There is mild thickening of the aortic valve. Aortic valve regurgitation is mild. Aortic valve sclerosis is present, with no evidence of aortic valve stenosis.  6. Aortic dilatation noted. There is mild dilatation of the ascending aorta, measuring 38 mm.  7. The inferior vena cava is normal in size with greater than 50% respiratory variability, suggesting right atrial pressure of 3 mmHg. FINDINGS  Left Ventricle: Left ventricular ejection fraction, by estimation, is 60 to 65%. The left ventricle has normal function. The left ventricle has no regional wall motion abnormalities. The left ventricular internal cavity size was normal in size. There is  no left ventricular hypertrophy. Left ventricular diastolic parameters are indeterminate. Right Ventricle: The right ventricular size is normal. No increase in right ventricular wall thickness. Right ventricular systolic function is normal. Left Atrium: Left atrial size was moderately dilated. Right Atrium: Right atrial size was normal in size. Pericardium: There is no evidence of pericardial effusion. Mitral Valve: The mitral valve is normal in structure. There is mild thickening of the mitral valve leaflet(s). There is mild calcification of the mitral valve leaflet(s). Moderate mitral annular calcification. Trivial mitral valve regurgitation. No evidence of mitral valve stenosis. Tricuspid Valve: The tricuspid valve is normal in structure. Tricuspid valve regurgitation is not demonstrated. No evidence of tricuspid stenosis. Aortic Valve: The aortic valve is tricuspid. There is mild calcification of the aortic valve. There is mild thickening of the aortic valve. Aortic valve regurgitation is mild. Aortic valve sclerosis is present, with no evidence of aortic valve stenosis. Aortic valve mean gradient measures 8.0 mmHg. Aortic valve peak gradient measures 16.8 mmHg. Aortic valve area, by VTI measures 2.52 cm. Pulmonic  Valve: The pulmonic valve was normal in structure. Pulmonic valve regurgitation is not visualized. No evidence of pulmonic stenosis. Aorta: Aortic dilatation noted. There is mild dilatation of the ascending aorta, measuring 38 mm. Venous: The inferior vena cava is normal in size with greater than 50% respiratory variability, suggesting right atrial pressure of 3 mmHg. IAS/Shunts: No atrial level shunt detected by color flow Doppler.  LEFT VENTRICLE PLAX 2D LVIDd:         4.90 cm   Diastology LVIDs:         3.50 cm   LV e' medial:  7.62 cm/s LV PW:         1.00 cm   LV e' lateral: 9.32 cm/s LV IVS:        1.10 cm LVOT diam:     2.30 cm LV SV:         84 LV SV Index:   46 LVOT  Area:     4.15 cm  RIGHT VENTRICLE             IVC RV S prime:     26.70 cm/s  IVC diam: 1.50 cm TAPSE (M-mode): 2.6 cm LEFT ATRIUM           Index LA diam:      4.40 cm 2.42 cm/m LA Vol (A4C): 45.2 ml 24.88 ml/m  AORTIC VALVE AV Area (Vmax):    2.45 cm AV Area (Vmean):   2.52 cm AV Area (VTI):     2.52 cm AV Vmax:           205.00 cm/s AV Vmean:          128.000 cm/s AV VTI:            0.333 m AV Peak Grad:      16.8 mmHg AV Mean Grad:      8.0 mmHg LVOT Vmax:         121.00 cm/s LVOT Vmean:        77.700 cm/s LVOT VTI:          0.202 m LVOT/AV VTI ratio: 0.61  AORTA Ao Root diam: 3.60 cm Ao Asc diam:  3.80 cm TRICUSPID VALVE TR Peak grad:   50.4 mmHg TR Vmax:        355.00 cm/s  SHUNTS Systemic VTI:  0.20 m Systemic Diam: 2.30 cm Charlton Haws MD Electronically signed by Charlton Haws MD Signature Date/Time: 11/08/2022/4:11:51 PM    Final    MR BRAIN WO CONTRAST  Result Date: 11/08/2022 CLINICAL DATA:  Mid-back pain, neuro deficit c/f demyelinating disease; Transient ischemic attack (TIA) Stroke, follow up Neuro deficit, acute, stroke suspected; Low back pain, no red flags, no prior management; L sided weakness EXAM: MRI HEAD WITHOUT CONTRAST MRI CERVICAL SPINE WITHOUT CONTRAST MRI THORACIC WITHOUT CONTRAST MRI LUMBAR WITHOUT CONTRAST  TECHNIQUE: Multiplanar, multiecho pulse sequences of the brain and surrounding structures, and cervical, thoracic, and lumbar spine were obtained without intravenous contrast. COMPARISON:  CT Head 11/08/22 FINDINGS: MRI HEAD FINDINGS Brain: There are acute infarcts in the right ACA territory along the parasagittal right frontal lobe (series 4, image 20) and in the right frontal centrum semiovale (series 3, image 40). No hemorrhage. No extra-axial fluid collection. Hydrocephalus. Sequela of moderate chronic microvascular ischemic change. Generalized volume loss without lobar predominance. Partially empty sella. Vascular: Normal flow voids. Skull and upper cervical spine: Normal marrow signal. Below for cervical spine findings. Sinuses/Orbits: No middle ear or mastoid effusion. Paranasal sinuses are clear bilateral lens replacement. Orbits are otherwise unremarkable. Partially empty sella. Other: None. MRI CERVICAL SPINE FINDINGS Limitations: Assessment is limited due to degree of motion artifact. Additional axial T2 weighted sequences were not acquired. Alignment: Mild retrolisthesis of C3 on C4 and C4 on C5. Grade 1 anterolisthesis of C7 on T1. Vertebrae: No fracture, evidence of discitis, or bone lesion. There is likely degenerative osseous fusion of C5-C6. Severe disc space loss at C3-C4, C4-C5, and C6-C7. Cord: There is likely cord signal abnormality (series 15, image 10) of the C3-C4 level. The appearance is more suggestive myelomalacia rather than demyelinating disease Posterior Fossa, vertebral arteries, paraspinal tissues: The degree of motion artifact precludes assessment of the paraspinal soft tissues and vascular flow voids Disc levels: Assessment for the degree of degenerative changes limited due to the degree of motion artifact. 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 MRI THORACIC SPINE FINDINGS Alignment:  Physiologic. Vertebrae: No fracture, evidence of  discitis, or bone lesion. Cord:  Normal signal and morphology. Paraspinal and other soft tissues: Negative. Disc levels: No definite evidence of high-grade spinal canal or neural foraminal stenosis. MRI LUMBAR SPINE FINDINGS Limitations: Markedly limited assessment of the lumbar spine vertebral bodies on the sagittal STIR sequences due to the degree of motion artifact Segmentation:  Standard. Alignment: Trace retrolisthesis of L2 on L3. Grade 1 anterolisthesis L4 on L5. Vertebrae:  No fracture, evidence of discitis, or bone lesion. Conus medullaris and cauda equina: Conus extends to the L1 level. Conus and cauda equina appear normal. Paraspinal and other soft tissues: There is asymmetric atrophy of the right psoas muscle. There is also atrophy of the paraspinal musculature. Disc levels: Assessment for the degree of degenerative changes limited due to degree of motion artifact. Within this limitation there is moderate to severe spinal canal narrowing at L3-L4, L4-L5. There is severe right-sided neural foraminal narrowing at L4-L5. Moderate right-sided neural foraminal narrowing at L3-L4 and L5-S1 Severe facet degenerative change at L4-L5 and L5-S1 fluid in the facet joints. IMPRESSION: 1. 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 No hemorrhage. 2. Assessment of the cervical spine is markedly limited due to the degree of motion artifact. 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 3. Assessment of the lumbar spine is markedly limited due to the degree of motion artifact. Within this limitation, there is moderate to severe spinal canal narrowing at L3-L4 and L4-L5 and severe right-sided neural foraminal narrowing at L4-L5. 4. Assessment of the thoracic spine is  limited due to a combination of motion artifact on the sagittal sequences and lack of axial T2 weighted sequences within this limitation, no definite evidence of high grade spinal canal stenosis or cord signal abnormality. Findings were discussed with Dr. Rodena Medin on 11/08/22 at 1:24 PM. Electronically Signed   By: Lorenza Cambridge M.D.   On: 11/08/2022 13:25   MR THORACIC SPINE WO CONTRAST  Result Date: 11/08/2022 CLINICAL DATA:  Mid-back pain, neuro deficit c/f demyelinating disease; Transient ischemic attack (TIA) Stroke, follow up Neuro deficit, acute, stroke suspected; Low back pain, no red flags, no prior management; L sided weakness EXAM: MRI HEAD WITHOUT CONTRAST MRI CERVICAL SPINE WITHOUT CONTRAST MRI THORACIC WITHOUT CONTRAST MRI LUMBAR WITHOUT CONTRAST TECHNIQUE: Multiplanar, multiecho pulse sequences of the brain and surrounding structures, and cervical, thoracic, and lumbar spine were obtained without intravenous contrast. COMPARISON:  CT Head 11/08/22 FINDINGS: MRI HEAD FINDINGS Brain: There are acute infarcts in the right ACA territory along the parasagittal right frontal lobe (series 4, image 20) and in the right frontal centrum semiovale (series 3, image 40). No hemorrhage. No extra-axial fluid collection. Hydrocephalus. Sequela of moderate chronic microvascular ischemic change. Generalized volume loss without lobar predominance. Partially empty sella. Vascular: Normal flow voids. Skull and upper cervical spine: Normal marrow signal. Below for cervical spine findings. Sinuses/Orbits: No middle ear or mastoid effusion. Paranasal sinuses are clear bilateral lens replacement. Orbits are otherwise unremarkable. Partially empty sella. Other: None. MRI CERVICAL SPINE FINDINGS Limitations: Assessment is limited due to degree of motion artifact. Additional axial T2 weighted sequences were not acquired. Alignment: Mild retrolisthesis of C3 on C4 and C4 on C5. Grade 1 anterolisthesis of C7 on T1. Vertebrae: No  fracture, evidence  of discitis, or bone lesion. There is likely degenerative osseous fusion of C5-C6. Severe disc space loss at C3-C4, C4-C5, and C6-C7. Cord: There is likely cord signal abnormality (series 15, image 10) of the C3-C4 level. The appearance is more suggestive myelomalacia rather than demyelinating disease Posterior Fossa, vertebral arteries, paraspinal tissues: The degree of motion artifact precludes assessment of the paraspinal soft tissues and vascular flow voids Disc levels: Assessment for the degree of degenerative changes limited due to the degree of motion artifact. 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 MRI THORACIC SPINE FINDINGS Alignment:  Physiologic. Vertebrae: No fracture, evidence of discitis, or bone lesion. Cord:  Normal signal and morphology. Paraspinal and other soft tissues: Negative. Disc levels: No definite evidence of high-grade spinal canal or neural foraminal stenosis. MRI LUMBAR SPINE FINDINGS Limitations: Markedly limited assessment of the lumbar spine vertebral bodies on the sagittal STIR sequences due to the degree of motion artifact Segmentation:  Standard. Alignment: Trace retrolisthesis of L2 on L3. Grade 1 anterolisthesis L4 on L5. Vertebrae:  No fracture, evidence of discitis, or bone lesion. Conus medullaris and cauda equina: Conus extends to the L1 level. Conus and cauda equina appear normal. Paraspinal and other soft tissues: There is asymmetric atrophy of the right psoas muscle. There is also atrophy of the paraspinal musculature. Disc levels: Assessment for the degree of degenerative changes limited due to degree of motion artifact. Within this limitation there is moderate to severe spinal canal narrowing at L3-L4, L4-L5. There is severe right-sided neural foraminal narrowing at L4-L5. Moderate right-sided neural foraminal narrowing at L3-L4 and L5-S1 Severe facet degenerative change at L4-L5 and L5-S1 fluid in the  facet joints. IMPRESSION: 1. 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 No hemorrhage. 2. Assessment of the cervical spine is markedly limited due to the degree of motion artifact. 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 3. Assessment of the lumbar spine is markedly limited due to the degree of motion artifact. Within this limitation, there is moderate to severe spinal canal narrowing at L3-L4 and L4-L5 and severe right-sided neural foraminal narrowing at L4-L5. 4. Assessment of the thoracic spine is limited due to a combination of motion artifact on the sagittal sequences and lack of axial T2 weighted sequences within this limitation, no definite evidence of high grade spinal canal stenosis or cord signal abnormality. Findings were discussed with Dr. Rodena Medin on 11/08/22 at 1:24 PM. Electronically Signed   By: Lorenza Cambridge M.D.   On: 11/08/2022 13:25   MR LUMBAR SPINE WO CONTRAST  Result Date: 11/08/2022 CLINICAL DATA:  Mid-back pain, neuro deficit c/f demyelinating disease; Transient ischemic attack (TIA) Stroke, follow up Neuro deficit, acute, stroke suspected; Low back pain, no red flags, no prior management; L sided weakness EXAM: MRI HEAD WITHOUT CONTRAST MRI CERVICAL SPINE WITHOUT CONTRAST MRI THORACIC WITHOUT CONTRAST MRI LUMBAR WITHOUT CONTRAST TECHNIQUE: Multiplanar, multiecho pulse sequences of the brain and surrounding structures, and cervical, thoracic, and lumbar spine were obtained without intravenous contrast. COMPARISON:  CT Head 11/08/22 FINDINGS: MRI HEAD FINDINGS Brain: There are acute infarcts in the right ACA territory along the parasagittal right frontal lobe (series 4, image 20) and in the right frontal centrum semiovale (series 3, image  40). No hemorrhage.  No extra-axial fluid collection. Hydrocephalus. Sequela of moderate chronic microvascular ischemic change. Generalized volume loss without lobar predominance. Partially empty sella. Vascular: Normal flow voids. Skull and upper cervical spine: Normal marrow signal. Below for cervical spine findings. Sinuses/Orbits: No middle ear or mastoid effusion. Paranasal sinuses are clear bilateral lens replacement. Orbits are otherwise unremarkable. Partially empty sella. Other: None. MRI CERVICAL SPINE FINDINGS Limitations: Assessment is limited due to degree of motion artifact. Additional axial T2 weighted sequences were not acquired. Alignment: Mild retrolisthesis of C3 on C4 and C4 on C5. Grade 1 anterolisthesis of C7 on T1. Vertebrae: No fracture, evidence of discitis, or bone lesion. There is likely degenerative osseous fusion of C5-C6. Severe disc space loss at C3-C4, C4-C5, and C6-C7. Cord: There is likely cord signal abnormality (series 15, image 10) of the C3-C4 level. The appearance is more suggestive myelomalacia rather than demyelinating disease Posterior Fossa, vertebral arteries, paraspinal tissues: The degree of motion artifact precludes assessment of the paraspinal soft tissues and vascular flow voids Disc levels: Assessment for the degree of degenerative changes limited due to the degree of motion artifact. 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 MRI THORACIC SPINE FINDINGS Alignment:  Physiologic. Vertebrae: No fracture, evidence of discitis, or bone lesion. Cord:  Normal signal and morphology. Paraspinal and other soft tissues: Negative. Disc levels: No definite evidence of high-grade spinal canal or neural foraminal stenosis. MRI LUMBAR SPINE FINDINGS Limitations: Markedly limited assessment of the lumbar spine vertebral bodies on the sagittal STIR sequences due to the degree of motion artifact Segmentation:  Standard. Alignment: Trace  retrolisthesis of L2 on L3. Grade 1 anterolisthesis L4 on L5. Vertebrae:  No fracture, evidence of discitis, or bone lesion. Conus medullaris and cauda equina: Conus extends to the L1 level. Conus and cauda equina appear normal. Paraspinal and other soft tissues: There is asymmetric atrophy of the right psoas muscle. There is also atrophy of the paraspinal musculature. Disc levels: Assessment for the degree of degenerative changes limited due to degree of motion artifact. Within this limitation there is moderate to severe spinal canal narrowing at L3-L4, L4-L5. There is severe right-sided neural foraminal narrowing at L4-L5. Moderate right-sided neural foraminal narrowing at L3-L4 and L5-S1 Severe facet degenerative change at L4-L5 and L5-S1 fluid in the facet joints. IMPRESSION: 1. 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 No hemorrhage. 2. Assessment of the cervical spine is markedly limited due to the degree of motion artifact. 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 3. Assessment of the lumbar spine is markedly limited due to the degree of motion artifact. Within this limitation, there is moderate to severe spinal canal narrowing at L3-L4 and L4-L5 and severe right-sided neural foraminal narrowing at L4-L5. 4. Assessment of the thoracic spine is limited due to a combination of motion artifact on the sagittal sequences and lack of axial T2 weighted sequences within this limitation, no definite evidence of high grade spinal canal stenosis or cord signal abnormality. Findings were discussed with Dr. Rodena Medin on 11/08/22 at 1:24 PM. Electronically Signed   By: Lorenza Cambridge M.D.   On: 11/08/2022 13:25   MR CERVICAL SPINE WO CONTRAST  Result Date:  11/08/2022 CLINICAL DATA:  Mid-back pain, neuro deficit c/f demyelinating disease; Transient  ischemic attack (TIA) Stroke, follow up Neuro deficit, acute, stroke suspected; Low back pain, no red flags, no prior management; L sided weakness EXAM: MRI HEAD WITHOUT CONTRAST MRI CERVICAL SPINE WITHOUT CONTRAST MRI THORACIC WITHOUT CONTRAST MRI LUMBAR WITHOUT CONTRAST TECHNIQUE: Multiplanar, multiecho pulse sequences of the brain and surrounding structures, and cervical, thoracic, and lumbar spine were obtained without intravenous contrast. COMPARISON:  CT Head 11/08/22 FINDINGS: MRI HEAD FINDINGS Brain: There are acute infarcts in the right ACA territory along the parasagittal right frontal lobe (series 4, image 20) and in the right frontal centrum semiovale (series 3, image 40). No hemorrhage. No extra-axial fluid collection. Hydrocephalus. Sequela of moderate chronic microvascular ischemic change. Generalized volume loss without lobar predominance. Partially empty sella. Vascular: Normal flow voids. Skull and upper cervical spine: Normal marrow signal. Below for cervical spine findings. Sinuses/Orbits: No middle ear or mastoid effusion. Paranasal sinuses are clear bilateral lens replacement. Orbits are otherwise unremarkable. Partially empty sella. Other: None. MRI CERVICAL SPINE FINDINGS Limitations: Assessment is limited due to degree of motion artifact. Additional axial T2 weighted sequences were not acquired. Alignment: Mild retrolisthesis of C3 on C4 and C4 on C5. Grade 1 anterolisthesis of C7 on T1. Vertebrae: No fracture, evidence of discitis, or bone lesion. There is likely degenerative osseous fusion of C5-C6. Severe disc space loss at C3-C4, C4-C5, and C6-C7. Cord: There is likely cord signal abnormality (series 15, image 10) of the C3-C4 level. The appearance is more suggestive myelomalacia rather than demyelinating disease Posterior Fossa, vertebral arteries, paraspinal tissues: The degree of motion artifact  precludes assessment of the paraspinal soft tissues and vascular flow voids Disc levels: Assessment for the degree of degenerative changes limited due to the degree of motion artifact. 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 MRI THORACIC SPINE FINDINGS Alignment:  Physiologic. Vertebrae: No fracture, evidence of discitis, or bone lesion. Cord:  Normal signal and morphology. Paraspinal and other soft tissues: Negative. Disc levels: No definite evidence of high-grade spinal canal or neural foraminal stenosis. MRI LUMBAR SPINE FINDINGS Limitations: Markedly limited assessment of the lumbar spine vertebral bodies on the sagittal STIR sequences due to the degree of motion artifact Segmentation:  Standard. Alignment: Trace retrolisthesis of L2 on L3. Grade 1 anterolisthesis L4 on L5. Vertebrae:  No fracture, evidence of discitis, or bone lesion. Conus medullaris and cauda equina: Conus extends to the L1 level. Conus and cauda equina appear normal. Paraspinal and other soft tissues: There is asymmetric atrophy of the right psoas muscle. There is also atrophy of the paraspinal musculature. Disc levels: Assessment for the degree of degenerative changes limited due to degree of motion artifact. Within this limitation there is moderate to severe spinal canal narrowing at L3-L4, L4-L5. There is severe right-sided neural foraminal narrowing at L4-L5. Moderate right-sided neural foraminal narrowing at L3-L4 and L5-S1 Severe facet degenerative change at L4-L5 and L5-S1 fluid in the facet joints. IMPRESSION: 1. 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 No hemorrhage. 2. Assessment of the cervical spine is markedly limited due to the degree of motion artifact. 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 3. Assessment of the lumbar spine is markedly limited due to the degree of motion artifact. Within this limitation, there is moderate to  severe spinal canal narrowing at L3-L4 and L4-L5 and severe right-sided neural foraminal narrowing at L4-L5. 4. Assessment of the thoracic spine is limited due to a combination of motion artifact on the sagittal sequences and lack of axial T2 weighted sequences within this limitation, no definite evidence of high grade spinal canal stenosis or cord signal abnormality. Findings were discussed with Dr. Rodena Medin on 11/08/22 at 1:24 PM. Electronically Signed   By: Lorenza Cambridge M.D.   On: 11/08/2022 13:25   CT HEAD CODE STROKE WO CONTRAST  Result Date: 11/08/2022 CLINICAL DATA:  Code stroke.  Left-sided weakness EXAM: CT HEAD WITHOUT CONTRAST TECHNIQUE: Contiguous axial images were obtained from the base of the skull through the vertex without intravenous contrast. RADIATION DOSE REDUCTION: This exam was performed according to the departmental dose-optimization program which includes automated exposure control, adjustment of the mA and/or kV according to patient size and/or use of iterative reconstruction technique. COMPARISON:  None Available. FINDINGS: Brain: No evidence of acute infarction, hemorrhage, hydrocephalus, extra-axial collection or mass lesion/mass effect. Chronic small vessel ischemia in the cerebral white matter to a moderate degree. Discrete and chronic appearing infarct at the left putamen and medial right thalamus. Moderate cerebral volume loss which is generalized. Vascular: No hyperdense vessel or unexpected calcification. Skull: Normal. Negative for fracture or focal lesion. Sinuses/Orbits: No acute finding. Other: Prelim sent to Dr. Selina Cooley in epic chat. ASPECTS [facility] Stroke Program Early CT Score) - Ganglionic level infarction (caudate, lentiform nuclei, internal capsule, insula, M1-M3  cortex): 7 - Supraganglionic infarction (M4-M6 cortex): 3 Total score (0-10 with 10 being normal): 10 IMPRESSION: 1. No acute finding. 2. 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 Electronically Signed   By: Tiburcio Pea M.D.   On: 11/08/2022 09:38    Vitals:   11/08/22 1958 11/08/22 2011 11/08/22 2351 11/09/22 0351  BP: (!) 164/58 (!) 164/58 (!) 141/56 (!) 149/57  Pulse: 95 95 84 75  Resp: 18  17 17   Temp: 99 F (37.2 C)  98.7 F (37.1 C) 98.1 F (36.7 C)  TempSrc: Oral  Oral Oral  SpO2: 94%  91% 98%  Weight:      Height:         PHYSICAL EXAM General:  Alert, well-nourished, well-developed patient in no acute distress Psych:  Mood and affect appropriate for situation CV: Regular rate and rhythm on monitor Respiratory:  Regular, unlabored respirations on room air GI: Abdomen soft and nontender  NEURO:  Mental Status: Drowsy, does not speak during exam.  Of note he did receive Ativan Cranial Nerves: PERRL. EOMI, visual fields full, no facial asymmetry, facial sensation intact, hearing intact, tongue/uvula/soft palate midline, normal sternocleidomastoid and trapezius muscle strength. No evidence of tongue atrophy or fasciculations Motor:  RUE 5/5          LUE 3/5 RLE 5/5           LLE 3/5 Denies pain with leg elevation  Tone: is normal and bulk is normal Sensation- Intact to light touch bilaterally Coordination: Distractible tremor noted in right upper extremity.  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 No hemorrhage. MRI Cervical Spine - Assessment of the cervical spine is markedly limited due to the degree of motion artifact. Within this limitation, there is severe spinal canal stenosis at C3-C4 and C4-C5 with likely cord signal abnormality at the C3-C4 level, which is more suggestive of myelomalacia rather than  demyelinating disease. MRI Lumbar Spine - Assessment of the lumbar spine is markedly limited due to the degree of motion artifact. Within this limitation, there is moderate to severe spinal canal narrowing at L3-L4 and L4-L5 and severe right-sided neural foraminal narrowing at L4-L5.  MRI Thoracic Spine - Assessment of the thoracic spine is limited due to a combination of motion artifact on the sagittal sequences and lack of axial T2 weighted sequences within this limitation, no definite evidence of high grade spinal canal stenosis or cord signal abnormality. EEG: This study is within normal limits. The excessive beta activity seen in the background is most likely due to the effect of benzodiazepine and is a benign EEG pattern. No seizures or epileptiform discharges were seen throughout the recording. A normal interictal EEG does not exclude the diagnosis of epilepsy. CTA neck:  Atherosclerotic plaque about both carotid bifurcations and within the proximal right ICA. Less than 50% stenosis of the proximal right ICA. Advanced cervical spondylosis. Probable vertebral body ankylosis at C5-C6. CTA head: Intracranial atherosclerotic disease with multifocal stenoses, most notably as follows. Up to moderate stenosis of the paraclinoid left ICA. Sites of severe stenosis within a mid M2 left MCA vessel. Moderate/severe stenosis within the right PCA P2 segment. Moderate stenosis within the left PCA P2 segment. Severe stenoses within a left PCA branch at the P4 segment level. 2D Echo EF 60-65%, left atrial dilation LDL 130 HgbA1c 5.7 VTE prophylaxis - SCDs aspirin 81 mg daily prior to  admission, now on aspirin 81 mg daily and clopidogrel 75 mg daily for 3 weeks and then Plavix alone Therapy recommendations:  CIR Disposition:    Hypertension Home meds:  Lisinopril, lopressor  Avoid hypotension Blood Pressure Goal: BP less than 220/110   Hyperlipidemia LDL 130, goal < 70 Add atorvastatin 40mg   Continue statin at discharge  Diabetes type II Controlled HgbA1c 5.7, goal < 7.0 CBGs SSI Recommend close follow-up with PCP for better DM control  Other Stroke Risk Factors ETOH use-on Ativan for CIWA protocol Consider adding klonopin   Other Active Problems Evaluated CK - 606 Hypothyroidism - TSH 5.929 Bilateral lower extremity myoclonus  Hospital day # 1  Patient seen and examined by NP/APP with MD. MD to update note as needed.   Elmer Picker, DNP, FNP-BC [facility] Pager: [phone]  STROKE MD NOTE :  I have personally obtained history,examined this patient, reviewed notes, independently viewed imaging studies, participated in medical decision making and plan of care.ROS completed by me personally and pertinent positives fully documented  I have made any additions or clarifications directly to the above note. Agree with note above.  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  Recommend aspirin and Plavix for 3 weeks followed by Plavix alone and aggressive risk factor modification.  Continue ongoing stroke workup.  He will likely need prolonged cardiac monitoring after discharge to look for paroxysmal A-fib.  Physical Occupational Therapy and rehab consults.  Discussed with Dr.Gonfa.  Greater than 50% time during this 50-minute visit was spent in counseling and coordination of care about his  embolic stroke and discussion about evaluation and treatment plan and answering questions.  Delia Heady, MD Medical Director [facility] Pager: [phone] 11/09/2022 4:34  PM   To contact Stroke Continuity provider, please refer to [email or web address]. After hours, contact General Neurology

## 2022-11-09 NOTE — Evaluation (Signed)
Physical Therapy Evaluation Patient Details Name: Samuel Pearson MRN: [id number] DOB: [birth year] Today's Date: 11/09/2022  History of Present Illness  Pt is a 77 y.o. male who presented 11/08/22 with L leg weakness and back pain. Pt also fell PTA. 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 PMH: heart murmur, HTN, anemia, anxiety   Clinical Impression  Pt presents with condition above and deficits mentioned below, see PT Problem List. PTA, he was independent without DME, living with his wife in a 1-level house with 1 STE. Currently, pt is lethargic, keeping his eyes closed often and often needing cues or assistance to open them throughout the session. Pt displays rhythmic repeated movements of his bil ankles (L>R) and R UE throughout the session as well. He displays deficits in level of arousal, cognition, balance, activity tolerance, coordination, and L-sided strength. Pt required total assist for bed mobility, maxA to transfer to stand with R UE support and L knee blocked, and mod-maxA for static sitting balance as he tends to lean posteriorly and laterally to the L. As pt has had a drastic functional decline and has good family support, he may benefit from intensive inpatient rehab, >3 hours/day. Will continue to follow acutely.        Assistance Recommended at Discharge Frequent or constant Supervision/Assistance  If plan is discharge home, recommend the following:  Can travel by private vehicle  Two people to help with walking and/or transfers;Two people to help with bathing/dressing/bathroom;Assistance with cooking/housework;Direct supervision/assist for medications management;Direct supervision/assist for financial management;Assistance with feeding;Assist for transportation;Help with stairs or ramp for entrance        Equipment  Recommendations Other (comment) (TBA)  Recommendations for Other Services  Rehab consult    Functional Status Assessment Patient has had a recent decline in their functional status and demonstrates the ability to make significant improvements in function in a reasonable and predictable amount of time.     Precautions / Restrictions Precautions Precautions: Fall Restrictions Weight Bearing Restrictions: No      Mobility  Bed Mobility Overal bed mobility: Needs Assistance Bed Mobility: Supine to Sit, Sit to Supine     Supine to sit: Total assist, HOB elevated Sit to supine: Total assist, HOB elevated   General bed mobility comments: Cued pt to bring each leg towards R EOB with min initiation in R leg initially. Bed pad used to pivot hips and total assist needed to sit up. Total assist to direct trunk and lift legs to return to supine.    Transfers Overall transfer level: Needs assistance Equipment used: 1 person hand held assist Transfers: Sit to/from Stand Sit to Stand: Max assist           General transfer comment: L knee blocked and bed pad utilized as sling under his buttocks to power pt up to stand 2x from EOB. Pt cued to hold onto therapist with his R UE with good success noted. Pt tends to lean to the L and flex at the hips and L knee in standing.    Ambulation/Gait               General Gait Details: unable  Stairs            Wheelchair Mobility     Tilt Bed    Modified Rankin (Stroke Patients Only) Modified Rankin (Stroke Patients Only) Pre-Morbid  Rankin Score: No symptoms Modified Rankin: Severe disability     Balance Overall balance assessment: Needs assistance Sitting-balance support: Single extremity supported, No upper extremity supported, Feet supported Sitting balance-Leahy Scale: Poor Sitting balance - Comments: Posterior and L lateral lean noted with no reactional strategies during LOB, needing mod-maxA consistently for static  sitting balance Postural control: Posterior lean, Left lateral lean Standing balance support: Single extremity supported, During functional activity Standing balance-Leahy Scale: Poor Standing balance comment: Pt tends to lean to the L and flex at the hips and L knee in standing, L knee blocked and R UE support with maxA to stand 2x ~10-15 sec each                             Pertinent Vitals/Pain Pain Assessment Pain Assessment: Faces Faces Pain Scale: Hurts a little bit Pain Location: generalized grimacing with mobility Pain Descriptors / Indicators: Grimacing Pain Intervention(s): Limited activity within patient's tolerance, Monitored during session, Repositioned    Home Living Family/patient expects to be discharged to:: Private residence Living Arrangements: Spouse/significant other Available Help at Discharge: Family;Available 24 hours/day Type of Home: House Home Access: Stairs to enter Entrance Stairs-Rails: None Entrance Stairs-Number of Steps: 1   Home Layout: One level Home Equipment: Shower seat - built in;Grab bars - tub/shower      Prior Function Prior Level of Function : Independent/Modified Independent;Driving             Mobility Comments: Shuffling gait pattern at baseline, intermittently places a hand on furniture/walls but not often; no other falls other than this one       Hand Dominance   Dominant Hand: Right    Extremity/Trunk Assessment   Upper Extremity Assessment Upper Extremity Assessment: Defer to OT evaluation (rhythmic movement of R UE, varying in movements)    Lower Extremity Assessment Lower Extremity Assessment: LLE deficits/detail;RLE deficits/detail RLE Deficits / Details: rhythmically dorsiflexes and plantarflexes the ankle intermittently (more consistently on L than R); able to lift R leg against gravity LLE Deficits / Details: rhythmically dorsiflexes and plantarflexes the ankle intermittently (more consistently on  L than R); unable to actively lift leg against gravity when cued; spasticity noted in hamstrings with Modified Ashworth Scale score of 1+; withdraws to painful stimuli at calf LLE Coordination: decreased fine motor;decreased gross motor       Communication   Communication: Other (comment) (limited verbalization)  Cognition Arousal/Alertness: Lethargic Behavior During Therapy: Flat affect Overall Cognitive Status: Impaired/Different from baseline Area of Impairment: Attention, Memory, Following commands, Safety/judgement, Awareness, Problem solving                   Current Attention Level: Focused Memory: Decreased short-term memory Following Commands: Follows one step commands inconsistently, Follows one step commands with increased time Safety/Judgement: Decreased awareness of safety, Decreased awareness of deficits Awareness: Intellectual Problem Solving: Slow processing, Decreased initiation, Difficulty sequencing, Requires verbal cues, Requires tactile cues General Comments: Pt lethargic with eyes closed upon arrival. Pt needing physical assistance to open his eyes the first few times then could actively open them when cued. Pt tends to maintain a forward gaze but can track slightly laterally each direction and report number of fingers wife is holding up on his L with 2/3x accuracy. Poor awareness of his posterior and L lateral lean and poor safety awareness. Slow processing and poor sequencing, needing multi-modal cues        General Comments General  comments (skin integrity, edema, etc.): pt appeared to be gagging and face turning red when supine while HOB was elevating, HR in 80s with SpO2 >/= 96% on RA, eventually stopped and pt calmed down and fell back to sleep; educated wife that if pt coughs when eating/drinking to stop feeding/letting him eat and notify RN immediately, she verbalized understanding    Exercises     Assessment/Plan    PT Assessment Patient needs  continued PT services  PT Problem List Decreased strength;Decreased activity tolerance;Decreased balance;Decreased mobility;Decreased cognition;Decreased coordination       PT Treatment Interventions DME instruction;Gait training;Functional mobility training;Therapeutic activities;Therapeutic exercise;Balance training;Neuromuscular re-education;Cognitive remediation;Patient/family education    PT Goals (Current goals can be found in the Care Plan section)  Acute Rehab PT Goals Patient Stated Goal: did not state; wife hopes for pt to improve PT Goal Formulation: With patient/family Time For Goal Achievement: 11/23/22 Potential to Achieve Goals: Good    Frequency Min 4X/week     Co-evaluation               AM-PAC PT "6 Clicks" Mobility  Outcome Measure Help needed turning from your back to your side while in a flat bed without using bedrails?: A Lot Help needed moving from lying on your back to sitting on the side of a flat bed without using bedrails?: Total Help needed moving to and from a bed to a chair (including a wheelchair)?: Total Help needed standing up from a chair using your arms (e.g., wheelchair or bedside chair)?: A Lot Help needed to walk in hospital room?: Total Help needed climbing 3-5 steps with a railing? : Total 6 Click Score: 8    End of Session Equipment Utilized During Treatment: Gait belt Activity Tolerance: Patient tolerated treatment well;Patient limited by lethargy Patient left: in bed;with call bell/phone within reach;with chair alarm set Nurse Communication: Mobility status;Need for lift equipment;Other (comment) (apparent gagging moment and vitals) PT Visit Diagnosis: Unsteadiness on feet (R26.81);Other abnormalities of gait and mobility (R26.89);Muscle weakness (generalized) (M62.81);Difficulty in walking, not elsewhere classified (R26.2);Other symptoms and signs involving the nervous system (R29.898);Hemiplegia and hemiparesis Hemiplegia -  Right/Left: Left Hemiplegia - dominant/non-dominant: Non-dominant Hemiplegia - caused by: Cerebral infarction    Time: 1133-1200 PT Time Calculation (min) (ACUTE ONLY): 27 min   Charges:   PT Evaluation $PT Eval Moderate Complexity: 1 Mod PT Treatments $Therapeutic Activity: 8-22 mins PT General Charges $$ ACUTE PT VISIT: 1 Visit         Raymond Gurney, PT, DPT Acute Rehabilitation Services  Office: [phone]   Jewel Baize 11/09/2022, 1:29 PM

## 2022-11-10 DIAGNOSIS — D539 Nutritional anemia, unspecified: Secondary | ICD-10-CM | POA: Diagnosis not present

## 2022-11-10 DIAGNOSIS — I63521 Cerebral infarction due to unspecified occlusion or stenosis of right anterior cerebral artery: Secondary | ICD-10-CM | POA: Diagnosis not present

## 2022-11-10 DIAGNOSIS — D696 Thrombocytopenia, unspecified: Secondary | ICD-10-CM | POA: Diagnosis not present

## 2022-11-10 DIAGNOSIS — I63431 Cerebral infarction due to embolism of right posterior cerebral artery: Secondary | ICD-10-CM

## 2022-11-10 LAB — RENAL FUNCTION PANEL
Albumin: 3.2 g/dL — ABNORMAL LOW (ref 3.5–5.0)
Anion gap: 9 (ref 5–15)
BUN: 9 mg/dL (ref 8–23)
CO2: 19 mmol/L — ABNORMAL LOW (ref 22–32)
Calcium: 8.3 mg/dL — ABNORMAL LOW (ref 8.9–10.3)
Chloride: 106 mmol/L (ref 98–111)
Creatinine, Ser: 0.8 mg/dL (ref 0.61–1.24)
GFR, Estimated: >60 mL/min (ref >60)
Glucose, Bld: 153 mg/dL — ABNORMAL HIGH (ref 70–99)
Phosphorus: 3.1 mg/dL (ref 2.5–4.6)
Potassium: 3.5 mmol/L (ref 3.5–5.1)
Sodium: 134 mmol/L — ABNORMAL LOW (ref 135–145)

## 2022-11-10 LAB — CBC
HCT: 38.1 % — ABNORMAL LOW (ref 39.0–52.0)
Hemoglobin: 12.6 g/dL — ABNORMAL LOW (ref 13.0–17.0)
MCH: 32.6 pg (ref 26.0–34.0)
MCHC: 33.1 g/dL (ref 30.0–36.0)
MCV: 98.7 fL (ref 80.0–100.0)
Platelets: 179 10*3/uL (ref 150–400)
RBC: 3.86 MIL/uL — ABNORMAL LOW (ref 4.22–5.81)
RDW: 12.6 % (ref 11.5–15.5)
WBC: 8.5 10*3/uL (ref 4.0–10.5)
nRBC: 0 % (ref 0.0–0.2)

## 2022-11-10 LAB — MAGNESIUM: Magnesium: 1.8 mg/dL (ref 1.7–2.4)

## 2022-11-10 NOTE — Progress Notes (Signed)
Physical Therapy Treatment Patient Details Name: FREDY CHIRINOS MRN: [id number] DOB: 10-11-1945 Today's Date: 11/10/2022   History of Present Illness Pt is a 77 y.o. male who presented 11/08/22 with L leg weakness and back pain. Pt also fell PTA. 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 PMH: heart murmur, HTN, anemia, anxiety    PT Comments  Patient resting in bed with Rt eye partially open and responding to RN, OT, PT with 1-3 word answers. More alert and giving thumbs up to work with therapy. Pt able to pump Rt ankle, and trace activation noted on Lt great toe with attempt to move Lt foot. Pt initiated bring Rt LE towards/off EOB with verbal cues to move supine>sit. Mod assist required to guide Rt UE to bed rail and cues to grip rail, assist needed 2x to maintain grip. Max +2 for safety ultimately required to fully pivot hips with bed pad, raise trunk, and scoot hips to EOB for feet on floor to increase seated stability. Patient has posterior and Lt lean in sitting requiring mod assist to maintain balance. 2x sit<>stand performed with Max +2 HHA with pivot on second stand to move bed>chair, bed pad utilized as lift to facilitate hip power up and pt holding therapist arm with Rt UE for support. EOS pt repositioned with pillows for optimal posture and comfort, Alarm on and call bell within reach and family at bedside. Will continue to progress as able.    Assistance Recommended at Discharge Frequent or constant Supervision/Assistance  If plan is discharge home, recommend the following:  Can travel by private vehicle    Two people to help with walking and/or transfers;Two people to help with bathing/dressing/bathroom;Assistance with cooking/housework;Direct supervision/assist for medications management;Direct supervision/assist for financial  management;Assistance with feeding;Assist for transportation;Help with stairs or ramp for entrance      Equipment Recommendations  Other (comment) (TBA)    Recommendations for Other Services Rehab consult     Precautions / Restrictions Precautions Precautions: Fall Restrictions Weight Bearing Restrictions: No     Mobility  Bed Mobility Overal bed mobility: Needs Assistance Bed Mobility: Supine to Sit, Sit to Supine     Supine to sit: Total assist, HOB elevated Sit to supine: Total assist, HOB elevated   General bed mobility comments: Cued pt to bring each leg towards R EOB with min initiation in R leg initially. Bed pad used to pivot hips and total assist needed to sit up. Total assist to direct trunk and lift legs to return to supine.    Transfers Overall transfer level: Needs assistance Equipment used: 2 person hand held assist Transfers: Sit to/from Stand, Bed to chair/wheelchair/BSC Sit to Stand: Max assist, +2 safety/equipment Stand pivot transfers: +2 safety/equipment, Max assist         General transfer comment: 2x sit<>stand from EOB with pivot bed>chair on second stand. Pt required max assist, bil LE blocking and pt holding therapist's arm wiht Rt hand, assist needed to place hand to grip arm. bed pad used as sling under pt's hips to facilitate power up and guide turn.    Ambulation/Gait                   Stairs             Wheelchair Mobility     Tilt Bed    Modified Rankin (  Stroke Patients Only) Modified Rankin (Stroke Patients Only) Pre-Morbid Rankin Score: No symptoms Modified Rankin: Severe disability     Balance Overall balance assessment: Needs assistance Sitting-balance support: Single extremity supported, No upper extremity supported, Feet supported Sitting balance-Leahy Scale: Poor Sitting balance - Comments: Posterior and L lateral lean noted with no reactional strategies during LOB, needing mod-maxA consistently for  static sitting balance Postural control: Posterior lean, Left lateral lean Standing balance support: Single extremity supported, During functional activity Standing balance-Leahy Scale: Poor Standing balance comment: Pt tends to lean to the L and flex at the hips and L knee in standing, L knee blocked and R UE support with maxA to stand 2x ~10-15 sec each                            Cognition Arousal/Alertness: Lethargic Behavior During Therapy: Flat affect Overall Cognitive Status: Impaired/Different from baseline Area of Impairment: Attention, Memory, Following commands, Safety/judgement, Awareness, Problem solving                   Current Attention Level: Focused Memory: Decreased short-term memory Following Commands: Follows one step commands with increased time, Follows one step commands consistently Safety/Judgement: Decreased awareness of safety, Decreased awareness of deficits Awareness: Intellectual Problem Solving: Slow processing, Decreased initiation, Difficulty sequencing, Requires verbal cues, Requires tactile cues General Comments: pt with improved alertness and engaging in therapy session. pt answering questions appropriately with 1-3 word responses and extra time to process. pt showing thumbs up or down to relay if feeling comfortable/good. pt able to keep Rt eye ~50% open throughout session, Lt eye remains mostly closed. pt showing no signs of discomfort throughout session. wife present at bedside and pt able to recognize and name her. Lt neglect/inattention.        Exercises      General Comments        Pertinent Vitals/Pain Pain Assessment Pain Assessment: Faces Faces Pain Scale: No hurt Pain Intervention(s): Monitored during session, Repositioned    Home Living Family/patient expects to be discharged to:: Private residence Living Arrangements: Spouse/significant other Available Help at Discharge: Family;Available 24 hours/day Type of  Home: House Home Access: Stairs to enter Entrance Stairs-Rails: None Entrance Stairs-Number of Steps: 1   Home Layout: One level Home Equipment: Shower seat - built in;Grab bars - tub/shower      Prior Function            PT Goals (current goals can now be found in the care plan section) Acute Rehab PT Goals Patient Stated Goal: did not state; wife hopes for pt to improve PT Goal Formulation: With patient/family Time For Goal Achievement: 11/23/22 Potential to Achieve Goals: Good Progress towards PT goals: Progressing toward goals    Frequency    Min 4X/week      PT Plan      Co-evaluation   Reason for Co-Treatment: For patient/therapist safety;To address functional/ADL transfers PT goals addressed during session: Mobility/safety with mobility;Balance;Strengthening/ROM OT goals addressed during session: Strengthening/ROM;ADL's and self-care      AM-PAC PT "6 Clicks" Mobility   Outcome Measure  Help needed turning from your back to your side while in a flat bed without using bedrails?: A Lot Help needed moving from lying on your back to sitting on the side of a flat bed without using bedrails?: Total Help needed moving to and from a bed to a chair (including a wheelchair)?: Total Help needed standing up  from a chair using your arms (e.g., wheelchair or bedside chair)?: A Lot Help needed to walk in hospital room?: Total Help needed climbing 3-5 steps with a railing? : Total 6 Click Score: 8    End of Session Equipment Utilized During Treatment: Gait belt Activity Tolerance: Patient tolerated treatment well;Patient limited by lethargy Patient left: in bed;with call bell/phone within reach;with chair alarm set Nurse Communication: Mobility status;Need for lift equipment;Other (comment) (apparent gagging moment and vitals) PT Visit Diagnosis: Unsteadiness on feet (R26.81);Other abnormalities of gait and mobility (R26.89);Muscle weakness (generalized)  (M62.81);Difficulty in walking, not elsewhere classified (R26.2);Other symptoms and signs involving the nervous system (R29.898);Hemiplegia and hemiparesis Hemiplegia - Right/Left: Left Hemiplegia - dominant/non-dominant: Non-dominant Hemiplegia - caused by: Cerebral infarction     Time: 1610-9604 PT Time Calculation (min) (ACUTE ONLY): 21 min  Charges:    $Therapeutic Activity: 8-22 mins PT General Charges $$ ACUTE PT VISIT: 1 Visit                     Wynn Maudlin, DPT Acute Rehabilitation Services Office [phone]  11/10/22 11:01 AM

## 2022-11-10 NOTE — Progress Notes (Signed)
PROGRESS NOTE  Samuel Pearson ZOX:[id number] DOB: 1946-05-09   PCP: Madelin Headings, MD  Patient is from: Home  DOA: 11/08/2022 LOS: 2  Chief complaints Chief Complaint  Patient presents with   Code Stroke     Brief Narrative / Interim history: 77 year old M with PMH of HTN, anxiety, anemia, heart murmur and EtOH abuse presenting with left-sided weakness and found to have acute CVA and right ACA territory as noted on MRI brain.  MRI cervical spine showed severe C3-4 and C4-5 spinal canal stenosis with possible cord signal abnormality concerning for myelomalacia.  Neurology consulted.  Patient admitted for further CVA workup.   CT angio head negative for LVO but moderate to severe multivessel stenosis bilaterally.  TTE without significant finding.  LDL 130.  A1c 5.7%.  Neurology recommended Plavix and aspirin for 3 weeks followed by Plavix alone.  Neurosurgery, Dr. Conchita Paris consulted about cervical spinal stenosis and felt finding to be chronic and recommended outpatient follow-up once he is off Plavix.  Did not feel like there is utility for steroid either.  Therapy recommended CIR.  CIR following.   Subjective: Seen and examined earlier this morning.  No major events overnight of this morning.  No complaints but not a great historian.  He is awake but not quite alert.  Responds to most questions with his thumb up and down.  Seems to have clear speech.  Oriented to self and his wife.  Follows commands.  Patient's wife at bedside.  Objective: Vitals:   11/10/22 0354 11/10/22 0453 11/10/22 0724 11/10/22 1109  BP: (!) 185/174 (!) 175/69 (!) 184/58 (!) 156/54  Pulse: 68 80 74 73  Resp: 18  19 15   Temp: 99.5 F (37.5 C)  99 F (37.2 C) 98.9 F (37.2 C)  TempSrc: Oral  Oral Oral  SpO2: 96%  98% 94%  Weight:      Height:        Examination:  GENERAL: No apparent distress.  Nontoxic. HEENT: MMM.  Vision and hearing grossly intact.  NECK: Supple.  No apparent JVD.  RESP:  No  IWOB.  Fair aeration bilaterally. CVS:  RRR. Heart sounds normal.  ABD/GI/GU: BS+. Abd soft, NTND.  MSK/EXT:  Moves extremities. No apparent deformity. No edema.  SKIN: no apparent skin lesion or wound NEURO: Awake but not quite alert.  Oriented to self and wife.  Follows commands.  Has left hemiparesis.  Seems to have left foot drop.  Further neuroexam limited. PSYCH: Calm. Normal affect.   Procedures:  None  Microbiology summarized: None  Assessment and plan: Principal Problem:   CVA (cerebral vascular accident) [facility]) Active Problems:   Essential hypertension   Macrocytic anemia   Hypocalcemia   Thrombocytopenia (HCC)   Alcohol use   Spinal stenosis  Acute CVA: Presents with left-sided weakness.  MRI brain showed acute CVA and right ACA territory.  Outside tPA window on arrival.  CT angio head negative for LVO but moderate to severe multivessel stenosis bilaterally.  TTE without significant finding.  LDL 130.  A1c 5.7%.  Patient has been on daily aspirin. -Neurology recs: -Plavix and aspirin followed by Plavix alone.  -Prolonged cardiac monitoring at discharge.  Cardiology consulted. -Aggressive risk reduction -Continue Lipitor. -Therapy recommended CIR.  CIR following.  Acute metabolic encephalopathy: Multifactorial including Ativan, CVA and possible delirium.  Improving.  EEG and negative for seizure.  TSH slightly elevated.  B12 is 204. -Continue thiamine, multivitamin and folic acid -Received vitamin B12 injection on 7/3.  Continue p.o. vitamin B12. -Reorientation and delirium precautions.  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  He has left-sided weakness with left foot drop. Neurosurgery, Dr. Conchita Paris consulted about cervical spinal stenosis and felt finding to be chronic and recommended outpatient follow-up once he is off Plavix.  Did not feel like there is utility for steroid  either. -Outpatient follow-up with neurosurgery after 3 weeks  Alcohol use: Reportedly drinks 2-3 beers plus glass of wine most nights.  Became very somnolent after receiving Ativan. -Monitor off IV Ativan. -Continue multivitamin, thiamine and folic acid -Encourage moderation or cessation.   Essential hypertension -Permissive hypertension given multiple intracranial stenosis   Macrocytic anemia Vitamin B12 deficiency Recent Labs    01/18/22 1055 11/08/22 0916 11/08/22 0931 11/10/22 0142  HGB 14.5 10.6* 9.5* 12.6*  -Vitamin B12 injection followed by p.o.   Hypocalcemia: Received IV calcium gluconate in ED   Thrombocytopenia: Resolved.  Hyponatremia: Mild -Monitor   Hyperlipidemia: LDL 130 -Statin as above    Body mass index is 25.82 kg/m.           DVT prophylaxis:  SCD's Start: 11/08/22 1439  Code Status: Full code Family Communication: Updated patient's wife at bedside Level of care: Telemetry Medical Status is: Inpatient Remains inpatient appropriate because: Acute CVA, acute metabolic encephalopathy   Final disposition: CIR Consultants:  Neurology Neurosurgery over the phone Cardiology  55 minutes with more than 50% spent in reviewing records, counseling patient/family and coordinating care.   Sch Meds:  Scheduled Meds:  atorvastatin  40 mg Oral Daily   vitamin B-12  1,000 mcg Oral Daily   multivitamin with minerals  1 tablet Oral Daily   thiamine  100 mg Oral Daily   Or   thiamine  100 mg Intravenous Daily   Continuous Infusions:  dextrose 5 % and 0.9 % NaCl 75 mL/hr at 11/10/22 0322   PRN Meds:.acetaminophen **OR** acetaminophen (TYLENOL) oral liquid 160 mg/5 mL **OR** acetaminophen, polyvinyl alcohol  Antimicrobials: Anti-infectives (From admission, onward)    None        I have personally reviewed the following labs and images: CBC: Recent Labs  Lab 11/08/22 0916 11/08/22 0931 11/10/22 0142  WBC 8.3  --  8.5  NEUTROABS  6.8  --   --   HGB 10.6* 9.5* 12.6*  HCT 32.7* 28.0* 38.1*  MCV 103.8*  --  98.7  PLT 141*  --  179   BMP &GFR Recent Labs  Lab 11/08/22 0916 11/08/22 0931 11/08/22 2103 11/09/22 0424 11/10/22 0142  NA 138 147* 136  --  134*  K 3.9 2.8* 3.7  --  3.5  CL 110 115* 106  --  106  CO2 17*  --  20*  --  19*  GLUCOSE 114* 91 130*  --  153*  BUN 13 12 12   --  9  CREATININE 0.79 0.40* 0.77  --  0.80  CALCIUM 8.0*  --  8.5*  --  8.3*  MG  --   --   --  1.9 1.8  PHOS  --   --   --   --  3.1   Estimated Creatinine Clearance: 68.7 mL/min (by C-G formula based on SCr of 0.8 mg/dL). Liver & Pancreas: Recent Labs  Lab 11/08/22 0916 11/10/22 0142  AST 29  --   ALT 20  --   ALKPHOS 47  --   BILITOT 1.0  --  PROT 6.0*  --   ALBUMIN 3.5 3.2*   No results for input(s): "LIPASE", "AMYLASE" in the last 168 hours. No results for input(s): "AMMONIA" in the last 168 hours. Diabetic: Recent Labs    11/08/22 2103  HGBA1C 5.7*   Recent Labs  Lab 11/08/22 0921  GLUCAP 79   Cardiac Enzymes: Recent Labs  Lab 11/08/22 2103  CKTOTAL 606*   No results for input(s): "PROBNP" in the last 8760 hours. Coagulation Profile: Recent Labs  Lab 11/08/22 0916  INR 1.2   Thyroid Function Tests: Recent Labs    11/08/22 2103  TSH 5.929*   Lipid Profile: Recent Labs    11/08/22 2103  CHOL 183  HDL 39*  LDLCALC 130*  TRIG 70  CHOLHDL 4.7   Anemia Panel: Recent Labs    11/08/22 2103  VITAMINB12 204   Urine analysis:    Component Value Date/Time   COLORURINE YELLOW 02/25/2020 0200   APPEARANCEUR CLEAR 02/25/2020 0200   LABSPEC 1.025 02/25/2020 0200   PHURINE 5.0 02/25/2020 0200   GLUCOSEU NEGATIVE 02/25/2020 0200   HGBUR NEGATIVE 02/25/2020 0200   HGBUR trace-intact 04/13/2009 0855   BILIRUBINUR NEGATIVE 02/25/2020 0200   BILIRUBINUR n 11/23/2011 0836   KETONESUR 20 (A) 02/25/2020 0200   PROTEINUR 30 (A) 02/25/2020 0200   UROBILINOGEN 0.2 11/23/2011 0836    UROBILINOGEN 0.2 04/13/2009 0855   NITRITE NEGATIVE 02/25/2020 0200   LEUKOCYTESUR NEGATIVE 02/25/2020 0200   Sepsis Labs: Invalid input(s): "PROCALCITONIN", "LACTICIDVEN"  Microbiology: No results found for this or any previous visit (from the past 240 hour(s)).  Radiology Studies: No results found.    Samuel Pearson [facility]  If 7PM-7AM, please contact night-coverage [email or web address] 11/10/2022, 3:25 PM

## 2022-11-10 NOTE — Evaluation (Signed)
SLP Cancellation Note  Patient Details Name: Samuel Pearson MRN: [id number] DOB: 05/09/1946   Cancelled treatment:       Reason Eval/Treat Not Completed: Other (comment) (RN reports pt very lethargic this am, will continue efforts)   Chales Abrahams 11/10/2022, 7:48 AM   Rolena Infante, MS [facility] SLP Acute Rehab Services Office [phone]

## 2022-11-10 NOTE — Plan of Care (Signed)

## 2022-11-10 NOTE — Progress Notes (Signed)
Inpatient Rehab Admissions Coordinator Note:   Per therapy recommendations patient was screened for CIR candidacy by Stephania Fragmin, PT. At this time, pt appears to be a potential candidate for CIR. I will place an order for rehab consult for full assessment, per our protocol.  Please contact me any with questions.Estill Dooms, PT, DPT [phone] 11/10/22 2:20 PM

## 2022-11-10 NOTE — Evaluation (Signed)
Occupational Therapy Evaluation Patient Details Name: Samuel Pearson MRN: [id number] DOB: June 24, 1945 Today's Date: 11/10/2022   History of Present Illness Pt is a 77 y.o. male who presented 11/08/22 with L leg weakness and back pain. Pt also fell PTA. 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 PMH: heart murmur, HTN, anemia, anxiety   Clinical Impression   This 77 yo male admitted with above presents to acute OT with PLOF of being totally independent with basic ADLs. Currently he is total A for all basic ADLs at bed level or while in supported sitting. He was more awake (not eyes open) today and followed some 1-step commands with increased time and did verbalize at least x2 when asked questions). He has decreased sitting balance and standing balance which greatly impacts his ability to do ADLs. He will continue to benefit from acute OT with follow up from intensive inpatient follow up therapy, >3 hours/day       Recommendations for follow up therapy are one component of a multi-disciplinary discharge planning process, led by the attending physician.  Recommendations may be updated based on patient status, additional functional criteria and insurance authorization.   Assistance Recommended at Discharge Frequent or constant Supervision/Assistance  Patient can return home with the following Two people to help with walking and/or transfers;Two people to help with bathing/dressing/bathroom;Assistance with cooking/housework;Assistance with feeding;Help with stairs or ramp for entrance;Assist for transportation;Direct supervision/assist for financial management;Direct supervision/assist for medications management    Functional Status Assessment  Patient has had a recent decline in their functional status and demonstrates the ability to make significant  improvements in function in a reasonable and predictable amount of time.  Equipment Recommendations  Other (comment) (TBD next venu)       Precautions / Restrictions Precautions Precautions: Fall Restrictions Weight Bearing Restrictions: No      Mobility Bed Mobility Overal bed mobility: Needs Assistance Bed Mobility: Supine to Sit, Sit to Supine     Supine to sit: Total assist, HOB elevated Sit to supine: Total assist, HOB elevated   General bed mobility comments: Cued pt to bring each leg towards R EOB with min initiation in R leg initially. Bed pad used to pivot hips and total assist needed to sit up. Total assist to direct trunk and lift legs to return to supine.    Transfers Overall transfer level: Needs assistance Equipment used: 2 person hand held assist Transfers: Sit to/from Stand, Bed to chair/wheelchair/BSC Sit to Stand: +2 safety/equipment, Max assist Stand pivot transfers: +2 safety/equipment, Max assist         General transfer comment: 2x sit<>stand from EOB with pivot bed>chair on second stand. Pt required max assist, bil LE blocking and pt holding therapist's arm with Rt hand, assist needed to place hand to grip arm. bed pad used as sling under pt's hips to facilitate power up and guide turn.      Balance Overall balance assessment: Needs assistance Sitting-balance support: Single extremity supported, No upper extremity supported, Feet supported Sitting balance-Leahy Scale: Poor Sitting balance - Comments: Posterior and L lateral lean noted with no reactional strategies during LOB, needing mod-maxA consistently for static sitting balance Postural control: Posterior lean, Left lateral lean Standing balance support: Single extremity supported, During functional activity Standing balance-Leahy Scale: Poor Standing balance comment: Pt tends to lean to the L and flex at the  hips and L knee in standing, L knee blocked and R UE support with maxA to stand 2x  ~10-15 sec each                           ADL either performed or assessed with clinical judgement   ADL Overall ADL's : Needs assistance/impaired                                       General ADL Comments: total A for all basic ADLs at bed level or supported sitting in recliner     Vision   Vision Assessment?: Vision impaired- to be further tested in functional context Additional Comments: Kept eyes shut 95 % of session            Pertinent Vitals/Pain Pain Assessment Pain Assessment: Faces Faces Pain Scale: No hurt     Hand Dominance Right   Extremity/Trunk Assessment Upper Extremity Assessment Upper Extremity Assessment: LUE deficits/detail LUE Deficits / Details: PROM WNL, 1/5 twitch noted at thumb when asked pt to move his left arm--no other movement seen, nor was this replicated LUE Coordination: decreased fine motor;decreased gross motor           Communication Communication Communication:  (limited verbalizations and when he did it only have spoken to first and asking a question)   Cognition Arousal/Alertness:  (appears lethargic, but keeps eyes closed 95% of time and when he does have eyes open it is the right eye only partially) Behavior During Therapy: Flat affect Overall Cognitive Status: Impaired/Different from baseline Area of Impairment: Following commands, Safety/judgement, Problem solving, Awareness                       Following Commands: Follows one step commands inconsistently, Follows one step commands with increased time (with repeated cues) Safety/Judgement: Decreased awareness of safety, Decreased awareness of deficits Awareness: Intellectual Problem Solving: Slow processing, Decreased initiation, Difficulty sequencing, Requires verbal cues, Requires tactile cues General Comments: Pt with eyes closed upon entering (RN says he is awake but difficulty with opening eyes due to where stroke is), upon sitting  him up on EOB and asking him who was sitting in the chair to the left of him he started waving and saying Aurea Graff (his wife), when asked how long they had been married it took him a bit but he finally said "all my adult life"                Home Living Family/patient expects to be discharged to:: Private residence Living Arrangements: Spouse/significant other Available Help at Discharge: Family;Available 24 hours/day Type of Home: House Home Access: Stairs to enter [employer] of Steps: 1 Entrance Stairs-Rails: None Home Layout: One level     Bathroom Shower/Tub: Producer, television/film/video: Standard     Home Equipment: Shower seat - built in;Grab bars - tub/shower          Prior Functioning/Environment Prior Level of Function : Independent/Modified Independent;Driving             Mobility Comments: Shuffling gait pattern at baseline, intermittently places a hand on furniture/walls but not often; no other falls other than this one          OT Problem List: Decreased strength;Decreased range of motion;Decreased activity tolerance;Impaired balance (sitting and/or standing);Impaired vision/perception;Impaired UE functional use;Obesity;Decreased  safety awareness;Impaired tone;Impaired sensation;Decreased cognition;Decreased coordination      OT Treatment/Interventions: Self-care/ADL training;DME and/or AE instruction;Patient/family education;Balance training;Therapeutic activities;Therapeutic exercise;Neuromuscular education;Visual/perceptual remediation/compensation    OT Goals(Current goals can be found in the care plan section) Acute Rehab OT Goals Patient Stated Goal: unable OT Goal Formulation: Patient unable to participate in goal setting Time For Goal Achievement: 11/24/22 Potential to Achieve Goals: Fair  OT Frequency: Min 1X/week    Co-evaluation PT/OT/SLP Co-Evaluation/Treatment: Yes Reason for Co-Treatment: For patient/therapist safety;To  address functional/ADL transfers PT goals addressed during session: Mobility/safety with mobility;Balance;Strengthening/ROM OT goals addressed during session: Strengthening/ROM;ADL's and self-care      AM-PAC OT "6 Clicks" Daily Activity     Outcome Measure Help from another person eating meals?: Total Help from another person taking care of personal grooming?: Total Help from another person toileting, which includes using toliet, bedpan, or urinal?: Total Help from another person bathing (including washing, rinsing, drying)?: Total Help from another person to put on and taking off regular upper body clothing?: Total Help from another person to put on and taking off regular lower body clothing?: Total 6 Click Score: 6   End of Session Equipment Utilized During Treatment: Gait belt Nurse Communication: Mobility status;Need for lift equipment  Activity Tolerance: Patient limited by fatigue Patient left: in chair;with call bell/phone within reach;with chair alarm set  OT Visit Diagnosis: Unsteadiness on feet (R26.81);Other abnormalities of gait and mobility (R26.89);Muscle weakness (generalized) (M62.81);Other symptoms and signs involving cognitive function;Hemiplegia and hemiparesis;Low vision, both eyes (H54.2) Hemiplegia - Right/Left: Left Hemiplegia - dominant/non-dominant: Non-Dominant                Time: 1610-9604 OT Time Calculation (min): 20 min Charges:  OT General Charges $OT Visit: 1 Visit OT Evaluation $OT Eval Low Complexity: 1 Low  Lindon Romp OT Acute Rehabilitation Services Office [phone]    Evette Georges 11/10/2022, 11:16 AM

## 2022-11-10 NOTE — Progress Notes (Signed)
STROKE TEAM PROGRESS NOTE   BRIEF HPI [name] is a 77 y.o. male with history of anxiety, heart murmur, HTN, anemia presenting with left leg weakness. Patient presents from home reporting left leg weakness. 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    SIGNIFICANT HOSPITAL EVENTS 7/2- presented as a code stroke  INTERIM HISTORY/SUBJECTIVE Patient is sitting up in bedside chair.  Continues to have left hemiparesis.  He is passed swallow eval and is on a diet.  Therapist recommend inpatient rehab.  He has no complaints today.  Vital signs stable.  Neurological exam unchanged. OBJECTIVE  CBC    Component Value Date/Time   WBC 8.5 11/10/2022 0142   RBC 3.86 (L) 11/10/2022 0142   HGB 12.6 (L) 11/10/2022 0142   HCT 38.1 (L) 11/10/2022 0142   PLT 179 11/10/2022 0142   MCV 98.7 11/10/2022 0142   MCH 32.6 11/10/2022 0142   MCHC 33.1 11/10/2022 0142   RDW 12.6 11/10/2022 0142   LYMPHSABS 0.7 11/08/2022 0916   MONOABS 0.8 11/08/2022 0916   EOSABS 0.0 11/08/2022 0916   BASOSABS 0.0 11/08/2022 0916    BMET    Component Value Date/Time   NA 134 (L) 11/10/2022 0142   K 3.5 11/10/2022 0142   CL 106 11/10/2022 0142   CO2 19 (L) 11/10/2022 0142   GLUCOSE 153 (H) 11/10/2022 0142   BUN 9 11/10/2022 0142   CREATININE 0.80 11/10/2022 0142   CALCIUM 8.3 (L) 11/10/2022 0142   GFRNONAA >60 11/10/2022 0142    IMAGING past 24 hours No results found.  Vitals:   11/10/22 0354 11/10/22 0453 11/10/22 0724 11/10/22 1109  BP: (!) 185/174 (!) 175/69 (!) 184/58 (!) 156/54  Pulse: 68 80 74 73  Resp: 18  19 15   Temp: 99.5 F (37.5 C)  99 F (37.2 C) 98.9 F (37.2 C)  TempSrc: Oral  Oral Oral  SpO2: 96%  98% 94%  Weight:      Height:         PHYSICAL EXAM General:  Alert, well-nourished, well-developed patient in no acute distress Psych:  Mood and affect appropriate for situation CV: Regular rate and rhythm on  monitor Respiratory:  Regular, unlabored respirations on room air GI: Abdomen soft and nontender  NEURO:  Mental Status: Drowsy, does not speak during exam.  Of note he did receive Ativan Cranial Nerves: PERRL. EOMI, visual fields full, no facial asymmetry, facial sensation intact, hearing intact, tongue/uvula/soft palate midline, normal sternocleidomastoid and trapezius muscle strength. No evidence of tongue atrophy or fasciculations Motor:  RUE 5/5          LUE 3/5 RLE 5/5           LLE 3/5 Denies pain with leg elevation  Tone: is normal and bulk is normal Sensation- Intact to light touch bilaterally Coordination: Distractible tremor noted in right upper extremity.  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  MRI examination of the brain - Acute infarcts in the right ACA territory along the parasagittal right frontal cortex and in the right frontal centrum semiovale. No hemorrhage. MRI Cervical Spine - Assessment of the cervical spine is markedly limited due to the degree of motion artifact.  Within this limitation, there is severe spinal canal stenosis at C3-C4 and C4-C5 with likely cord signal abnormality at the C3-C4 level, which is more suggestive of myelomalacia rather than  demyelinating disease. MRI Lumbar Spine - Assessment of the lumbar spine is markedly limited due to the degree of motion artifact. Within this limitation, there is moderate to severe spinal canal narrowing at L3-L4 and L4-L5 and severe right-sided neural foraminal narrowing at L4-L5.  MRI Thoracic Spine - Assessment of the thoracic spine is limited due to a combination of motion artifact on the sagittal sequences and lack of axial T2 weighted  sequences within this limitation, no definite evidence of high grade spinal canal stenosis or cord signal abnormality. EEG: This study is within normal limits. The excessive beta activity seen in the background is most likely due to the effect of benzodiazepine and is a benign EEG pattern. No seizures or epileptiform discharges were seen throughout the recording. A normal interictal EEG does not exclude the diagnosis of epilepsy. CTA neck:  Atherosclerotic plaque about both carotid bifurcations and within the proximal right ICA. Less than 50% stenosis of the proximal right ICA. Advanced cervical spondylosis. Probable vertebral body ankylosis at C5-C6. CTA head: Intracranial atherosclerotic disease with multifocal stenoses, most notably as follows. Up to moderate stenosis of the paraclinoid left ICA. Sites of severe stenosis within a mid M2 left MCA vessel. Moderate/severe stenosis within the right PCA P2 segment. Moderate stenosis within the left PCA P2 segment. Severe stenoses within a left PCA branch at the P4 segment level. 2D Echo EF 60-65%, left atrial dilation LDL 130 HgbA1c 5.7 VTE prophylaxis - SCDs aspirin 81 mg daily prior to admission, now on aspirin 81 mg daily and clopidogrel 75 mg daily for 3 weeks and then Plavix alone Therapy recommendations:  CIR Disposition:    Hypertension Home meds:  Lisinopril, lopressor  Avoid hypotension Blood Pressure Goal: BP less than 220/110   Hyperlipidemia LDL 130, goal < 70 Add atorvastatin 40mg   Continue statin at discharge  Diabetes type II Controlled HgbA1c 5.7, goal < 7.0 CBGs SSI Recommend close follow-up with PCP for better DM control  Other Stroke Risk Factors ETOH use-on Ativan for CIWA protocol Consider adding klonopin   Other Active Problems Evaluated CK - 606 Hypothyroidism - TSH 5.929 Bilateral lower extremity myoclonus  Hospital day # 2  He  presented with left leg weakness secondary to 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  Recommend aspirin and Plavix for 3 weeks followed by Plavix alone and aggressive risk factor modification. Recommend prolonged cardiac monitoring at discharge to look for paroxysmal A-fib.  Continue ongoing physical ,Occupational Therapy and rehab consults.  Discussed with Dr.Gonfa.  Stroke team will sign off.  Follow-up with an outpatient stroke clinic in 2 months.  Greater than 50% time during this  35-minute visit was spent in counseling and coordination of care about his embolic stroke and discussion about evaluation and treatment plan and answering questions.  Delia Heady, MD Medical Director [facility] Pager: [phone] 11/10/2022 1:05 PM   To contact Stroke Continuity provider, please refer to [email or web address]. After hours, contact General Neurology

## 2022-11-11 DIAGNOSIS — D539 Nutritional anemia, unspecified: Secondary | ICD-10-CM | POA: Diagnosis not present

## 2022-11-11 DIAGNOSIS — D696 Thrombocytopenia, unspecified: Secondary | ICD-10-CM | POA: Diagnosis not present

## 2022-11-11 DIAGNOSIS — I63521 Cerebral infarction due to unspecified occlusion or stenosis of right anterior cerebral artery: Secondary | ICD-10-CM | POA: Diagnosis not present

## 2022-11-11 LAB — RENAL FUNCTION PANEL
Albumin: 3 g/dL — ABNORMAL LOW (ref 3.5–5.0)
Anion gap: 7 (ref 5–15)
BUN: 8 mg/dL (ref 8–23)
CO2: 21 mmol/L — ABNORMAL LOW (ref 22–32)
Calcium: 8.1 mg/dL — ABNORMAL LOW (ref 8.9–10.3)
Chloride: 107 mmol/L (ref 98–111)
Creatinine, Ser: 0.77 mg/dL (ref 0.61–1.24)
GFR, Estimated: >60 mL/min (ref >60)
Glucose, Bld: 132 mg/dL — ABNORMAL HIGH (ref 70–99)
Phosphorus: 2.3 mg/dL — ABNORMAL LOW (ref 2.5–4.6)
Potassium: 3.5 mmol/L (ref 3.5–5.1)
Sodium: 135 mmol/L (ref 135–145)

## 2022-11-11 LAB — AMMONIA: Ammonia: 26 umol/L (ref 9–35)

## 2022-11-11 LAB — T4, FREE: Free T4: 0.83 ng/dL (ref 0.61–1.12)

## 2022-11-11 LAB — TSH: TSH: 8.104 u[IU]/mL — ABNORMAL HIGH (ref 0.350–4.500)

## 2022-11-11 LAB — MAGNESIUM: Magnesium: 1.9 mg/dL (ref 1.7–2.4)

## 2022-11-11 MED ORDER — POTASSIUM CHLORIDE 20 MEQ PO PACK
40.0000 meq | PACK | Freq: Once | ORAL | Status: AC
  Administered 2022-11-11: 40 meq via ORAL
  Filled 2022-11-11: qty 2

## 2022-11-11 NOTE — Care Management Important Message (Signed)
Important Message  Patient Details  Name: Samuel Pearson MRN: [id number] Date of Birth: August 08, 1945   Medicare Important Message Given:  Yes     Liz Pinho 11/11/2022, 2:40 PM

## 2022-11-11 NOTE — Plan of Care (Signed)

## 2022-11-11 NOTE — Progress Notes (Signed)
Physical Therapy Treatment Patient Details Name: Samuel Pearson MRN: [id number] DOB: 23-Aug-1945 Today's Date: 11/11/2022   History of Present Illness Pt is a 77 y.o. male who presented 11/08/22 with L leg weakness and back pain. Pt also fell PTA. 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 PMH: heart murmur, HTN, anemia, anxiety    PT Comments  Patient resting in bed and eager to work with therapy. Both eyes open! Pt able to converse with therapist throughout session with extra time. Pt expressing feelings of sadness and a loss of pride that he is so limited but remained motivated throughout session. Pt required Mod-Max assist to roll Rt/Lt for pericare at start of session and demonstrated good initiation to use Rt UE/LE for bed mobility with extra time to process. Pt required Max assist to raise trunk upright and obtain seated balance at EOB. Mod-Max +2 for initial stand with Stedy with manual facilitation to weight shift Rt and prevent Lt lean. Pt very excited to stand with Stedy and pumping Rt fist in symbol of success. Pt did require increased assist to rise for stand from stedy paddles to lower to recliner. Repositioned in chair for midline posture and Lt UE supported with blanket rolls. Pt remains good candidate for intense rehab follow up. Will continue to progress as able.    Assistance Recommended at Discharge Frequent or constant Supervision/Assistance  If plan is discharge home, recommend the following:  Can travel by private vehicle    Two people to help with walking and/or transfers;Two people to help with bathing/dressing/bathroom;Assistance with cooking/housework;Direct supervision/assist for medications management;Direct supervision/assist for financial management;Assistance with feeding;Assist for transportation;Help with stairs or ramp  for entrance      Equipment Recommendations  Other (comment) (TBA)    Recommendations for Other Services Rehab consult     Precautions / Restrictions Precautions Precautions: Fall Restrictions Weight Bearing Restrictions: No     Mobility  Bed Mobility Overal bed mobility: Needs Assistance Bed Mobility: Supine to Sit     Supine to sit: Total assist, HOB elevated     General bed mobility comments: pt able to flex Rt LE and punch Rt UE upwards and contralaterally towards bed rail for Lt rolling. Max asisst to complete to Lt and Max +2 to roll Rt. pt had BM and total assist for pericare. pt able to bring Rt LE off EOB with verbal cues. Max assist to bring Lt LE off and press trunk upright to sit EOB.    Transfers Overall transfer level: Needs assistance Equipment used: 2 person hand held assist Transfers: Sit to/from Stand, Bed to chair/wheelchair/BSC Sit to Stand: Max assist, +2 safety/equipment Stand pivot transfers: +2 safety/equipment, Max assist         General transfer comment: Stedy positioned for sit<>stand to trial for bed>chair transfer. Pt able to use Rt UE and LE to power up. manual blocking to prevent Lt lean and support Lt UE. mod-max+2 for stand and to bring paddles down for seated position on Stedy. dependent transfer bed>chair. Pt required increased assist to rise from stedy paddles and control lower to chair. Transfer via Lift Equipment: Producer, television/film/video  Tilt Bed    Modified Rankin (Stroke Patients Only) Modified Rankin (Stroke Patients Only) Pre-Morbid Rankin Score: No symptoms Modified Rankin: Severe disability     Balance Overall balance assessment: Needs assistance Sitting-balance support: Single extremity supported, No upper extremity supported, Feet supported Sitting balance-Leahy Scale: Poor Sitting balance - Comments: Posterior and L lateral lean noted  with no reactional strategies during LOB, needing mod-maxA consistently for static sitting balance Postural control: Posterior lean, Left lateral lean Standing balance support: Single extremity supported, During functional activity Standing balance-Leahy Scale: Poor                              Cognition Arousal/Alertness: Lethargic Behavior During Therapy: Flat affect Overall Cognitive Status: Impaired/Different from baseline Area of Impairment: Attention, Memory, Following commands, Safety/judgement, Awareness, Problem solving                   Current Attention Level: Focused Memory: Decreased short-term memory Following Commands: Follows one step commands with increased time, Follows one step commands consistently Safety/Judgement: Decreased awareness of safety, Decreased awareness of deficits Awareness: Intellectual Problem Solving: Slow processing, Decreased initiation, Difficulty sequencing, Requires verbal cues, Requires tactile cues General Comments: pt with improved alertness and engaging in therapy session. pt answering questions appropriately with 1-3 word responses and extra time to process. pt showing thumbs up or down to relay if feeling comfortable/good. pt able to keep Rt eye ~50% open throughout session, Lt eye remains mostly closed. pt showing no signs of discomfort throughout session. wife present at bedside and pt able to recognize and name her. Lt neglect/inattention.        Exercises      General Comments        Pertinent Vitals/Pain Pain Assessment Faces Pain Scale: No hurt    Home Living                          Prior Function            PT Goals (current goals can now be found in the care plan section) Acute Rehab PT Goals Patient Stated Goal: did not state; wife hopes for pt to improve PT Goal Formulation: With patient/family Time For Goal Achievement: 11/23/22 Potential to Achieve Goals: Good Progress towards PT  goals: Progressing toward goals    Frequency    Min 4X/week      PT Plan Current plan remains appropriate    Co-evaluation              AM-PAC PT "6 Clicks" Mobility   Outcome Measure  Help needed turning from your back to your side while in a flat bed without using bedrails?: A Lot Help needed moving from lying on your back to sitting on the side of a flat bed without using bedrails?: Total Help needed moving to and from a bed to a chair (including a wheelchair)?: Total Help needed standing up from a chair using your arms (e.g., wheelchair or bedside chair)?: A Lot Help needed to walk in hospital room?: Total Help needed climbing 3-5 steps with a railing? : Total 6 Click Score: 8    End of Session Equipment Utilized During Treatment: Gait belt Activity Tolerance: Patient tolerated treatment well;Patient limited by lethargy Patient left: in bed;with call bell/phone within reach;with chair alarm set Nurse Communication: Mobility status;Need for lift equipment;Other (comment) (apparent gagging moment and vitals) PT Visit Diagnosis: Unsteadiness  on feet (R26.81);Other abnormalities of gait and mobility (R26.89);Muscle weakness (generalized) (M62.81);Difficulty in walking, not elsewhere classified (R26.2);Other symptoms and signs involving the nervous system (R29.898);Hemiplegia and hemiparesis Hemiplegia - Right/Left: Left Hemiplegia - dominant/non-dominant: Non-dominant Hemiplegia - caused by: Cerebral infarction     Time: 1610-9604 PT Time Calculation (min) (ACUTE ONLY): 34 min  Charges:    $Therapeutic Activity: 23-37 mins PT General Charges $$ ACUTE PT VISIT: 1 Visit                     Wynn Maudlin, DPT Acute Rehabilitation Services Office [phone]  11/11/22 2:22 PM

## 2022-11-11 NOTE — Progress Notes (Signed)
Notified that insurance is requiring peer to peer review for CIR.  I called  [phone], and talked to Dr. Karie Mainland.  After we discussed about patient's progress and current status, Dr. Karie Mainland said "I think he would benefit from inpatient rehab".  She appreciated the call.  She stated she will review his case and notify our case manager.

## 2022-11-11 NOTE — TOC Progression Note (Signed)
Transition of Care [facility]) - Progression Note    Patient Details  Name: Samuel Pearson MRN: [id number] Date of Birth: Mar 08, 1946  Transition of Care [facility]) CM/SW Contact  Kermit Balo, RN Phone Number: 11/11/2022, 12:26 PM  Clinical Narrative:     CIR to start insurance auth for rehab admit.  TOC following.  Expected Discharge Plan: IP Rehab Facility Barriers to Discharge: Continued Medical Work up  Expected Discharge Plan and Services   Discharge Planning Services: CM Consult Post Acute Care Choice: IP Rehab Living arrangements for the past 2 months: Single Family Home                                       Social Determinants of Health (SDOH) Interventions SDOH Screenings   Food Insecurity: No Food Insecurity (11/08/2022)  Housing: Low Risk  (11/08/2022)  Transportation Needs: No Transportation Needs (11/08/2022)  Utilities: Not At Risk (11/08/2022)  Depression (PHQ2-9): Low Risk  (01/18/2022)  Tobacco Use: Low Risk  (11/08/2022)    Readmission Risk Interventions     No data to display

## 2022-11-11 NOTE — Progress Notes (Signed)
Inpatient Rehab Coordinator Note:  I met with patient and his spouse, Aurea Graff, at bedside to discuss CIR recommendations and goals/expectations of CIR stay.  We reviewed 3 hrs/day of therapy, physician follow up, and average length of stay 2 weeks (dependent upon progress) with goals tbd based on today's therapy session.  Aurea Graff states she feels comfortable providing min assist for mobility and ADLs and note pt slow with progress.  They do have a daughter that lives locally.  We discussed insurance auth process and I will start that today and f/u with them this afternoon to discuss goals.    Estill Dooms, PT, DPT Admissions Coordinator [phone] 11/11/22  10:55 AM

## 2022-11-11 NOTE — PMR Pre-admission (Signed)
PMR Admission Coordinator Pre-Admission Assessment  Patient: Samuel Pearson is an 77 y.o., male MRN: [id number] DOB: 1945/05/23 Height: 5\' 6"  (167.6 cm) Weight: 72.6 kg  Insurance Information HMO: yes    PPO:      PCP:      IPA:      80/20:      OTHER:  PRIMARY: [employer] Medicare      Policy#: [id number]      Subscriber: pt CM Name: ***      Phone#: [phone]     Fax#: [phone] Pre-Cert#: [id number]  auth for CIR from *** for admit *** with updates due to fax listed above on ***      Employer:  Benefits:  Phone #: [phone]     Name:  Eff. Date: 05/09/22     Deduct: $0      Out of Pocket Max: $3600 ( met $40)      Life Max: na/ CIR: $295/day for days 1-5      SNF: 20 full days Outpatient:      Co-Pay: $20 Home Health: 100%      Co-Pay:  DME: 80%     Co-Pay: 20% Providers:  SECONDARY:       Policy#:      Phone#:   Artist:       Phone#:   The Engineer, materials Information Summary" for patients in Inpatient Rehabilitation Facilities with attached "Privacy Act Statement-Health Care Records" was provided and verbally reviewed with: Patient and Family  Emergency Contact Information Contact Information     Name Relation Home Work Mobile   [place] Spouse [phone]  [phone]       Current Medical History  Patient Admitting Diagnosis: CVA   History of Present Illness: Pt is a 77 y/o male with PMH of heart murmur, HTN, anemia, and anxiety, admitted to [facility] on 7/2 with c/o LLE weakness and back pain.  1 fall immediately PTA.  CT head notable only for atrophy and chronic small vessel ischemia.  Afebrile with HR 90-102, RR 13-27, BP up to 182 systolic.  Labs notable for hbg 9.5, platelets 141, and calcium 8.  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  Plan for loop recorder at discharge.  Therapy evaluations completed and pt was recommended for CIR.    Complete NIHSS TOTAL: 15  Patient's medical record from [facility] has been reviewed by the rehabilitation admission coordinator and physician.  Past Medical History  Past Medical History:  Diagnosis Date   Allergic rhinitis    Anxiety    Detached retina    Heart murmur    has had one when a child and checked out ok  never? 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  elevated MCV  No other sx     Has the patient had major surgery during 100 days prior to admission? Yes  Family History   family history includes Arthritis in his father and mother; Hypertension in his father and mother.  Current Medications  Current Facility-Administered Medications:    acetaminophen (TYLENOL) tablet 650 mg, 650 mg, Oral, Q4H  PRN **OR** acetaminophen (TYLENOL) 160 MG/5ML solution 650 mg, 650 mg, Per Tube, Q4H PRN **OR** acetaminophen (TYLENOL) suppository 650 mg, 650 mg, Rectal, Q4H PRN, Smith, Rondell A, MD   atorvastatin (LIPITOR) tablet 40 mg, 40 mg, Oral, Daily, Shafer, Devon, NP, 40 mg at 11/11/22 0847   [COMPLETED] cyanocobalamin (VITAMIN B12) injection 1,000 mcg, 1,000 mcg, Intramuscular, Once, 1,000 mcg at 11/09/22 1050 **FOLLOWED BY** cyanocobalamin (VITAMIN B12) tablet 1,000 mcg, 1,000 mcg, Oral, Daily, Gonfa, Taye T, MD, 1,000 mcg at 11/11/22 0847   dextrose 5 %-0.9 % sodium chloride infusion, , Intravenous, Continuous, Gonfa, Taye T, MD, Last Rate: 75 mL/hr at 11/11/22 0331, New Bag at 11/11/22 0331   multivitamin with minerals tablet 1 tablet, 1 tablet, Oral, Daily, Madelyn Flavors A, MD, 1 tablet at 11/11/22 0847   polyvinyl alcohol (LIQUIFILM TEARS) 1.4 % ophthalmic solution 1 drop, 1 drop, Both Eyes, PRN, Katrinka Blazing, Rondell A,  MD   thiamine (VITAMIN B1) tablet 100 mg, 100 mg, Oral, Daily, 100 mg at 11/11/22 0847 **OR** thiamine (VITAMIN B1) injection 100 mg, 100 mg, Intravenous, Daily, Clydie Braun, MD  Patients Current Diet:  Diet Order             Diet Heart Room service appropriate? Yes with Assist; Fluid consistency: Thin  Diet effective now                   Precautions / Restrictions Precautions Precautions: Fall Restrictions Weight Bearing Restrictions: No   Has the patient had 2 or more falls or a fall with injury in the past year? No  Prior Activity Level Limited Community (1-2x/wk): indep with mobility, driving, no DME  Prior Functional Level Self Care: Did the patient need help bathing, dressing, using the toilet or eating? Independent  Indoor Mobility: Did the patient need assistance with walking from room to room (with or without device)? Independent  Stairs: Did the patient need assistance with internal or external stairs (with or without device)? Independent  Functional Cognition: Did the patient need help planning regular tasks such as shopping or remembering to take medications? Independent  Patient Information Are you of Hispanic, Latino/a,or Spanish origin?: A. No, not of Hispanic, Latino/a, or Spanish origin What is your race?: A. White Do you need or want an interpreter to communicate with a doctor or health care staff?: 0. No  Patient's Response To:  Health Literacy and Transportation Is the patient able to respond to health literacy and transportation needs?: Yes Health Literacy - How often do you need to have someone help you when you read instructions, pamphlets, or other written material from your doctor or pharmacy?: Never In the past 12 months, has lack of transportation kept you from medical appointments or from getting medications?: No In the past 12 months, has lack of transportation kept you from meetings, work, or from getting things needed for daily  living?: No  Journalist, newspaper / Equipment Home Assistive Devices/Equipment: None Home Equipment: Shower seat - built in, Grab bars - tub/shower  Prior Device Use: Indicate devices/aids used by the patient prior to current illness, exacerbation or injury? None of the above  Current Functional Level Cognition  Overall Cognitive Status: Impaired/Different from baseline Current Attention Level: Focused Orientation Level: Oriented to person, Oriented to place, Oriented to time Following Commands: Follows one step commands with increased time, Follows one step commands consistently Safety/Judgement: Decreased awareness of safety, Decreased awareness of deficits General Comments: pt with improved alertness and engaging in therapy  session. pt answering questions appropriately with 1-3 word responses and extra time to process. pt showing thumbs up or down to relay if feeling comfortable/good. pt able to keep Rt eye ~50% open throughout session, Lt eye remains mostly closed. pt showing no signs of discomfort throughout session. wife present at bedside and pt able to recognize and name her. Lt neglect/inattention.    Extremity Assessment (includes Sensation/Coordination)  Upper Extremity Assessment: LUE deficits/detail LUE Deficits / Details: PROM WNL, 1/5 twitch noted at thumb when asked pt to move his left arm--no other movement seen, nor was this replicated LUE Coordination: decreased fine motor, decreased gross motor  Lower Extremity Assessment: LLE deficits/detail, RLE deficits/detail RLE Deficits / Details: rhythmically dorsiflexes and plantarflexes the ankle intermittently (more consistently on L than R); able to lift R leg against gravity LLE Deficits / Details: rhythmically dorsiflexes and plantarflexes the ankle intermittently (more consistently on L than R); unable to actively lift leg against gravity when cued; spasticity noted in hamstrings with Modified Ashworth Scale score of 1+;  withdraws to painful stimuli at calf LLE Coordination: decreased fine motor, decreased gross motor    ADLs  Overall ADL's : Needs assistance/impaired General ADL Comments: total A for all basic ADLs at bed level or supported sitting in recliner    Mobility  Overal bed mobility: Needs Assistance Bed Mobility: Rolling, Supine to Sit Rolling: Mod assist, Max assist Supine to sit: Total assist, HOB elevated Sit to supine: Total assist, HOB elevated General bed mobility comments: pt able to flex Rt LE and punch Rt UE upwards and contralaterally towards bed rail for Lt rolling. Max asisst to complete to Lt and Max +2 to roll Rt. pt had BM and total assist for pericare. pt able to bring Rt LE off EOB with verbal cues. Max assist to bring Lt LE off and press trunk upright to sit EOB.    Transfers  Overall transfer level: Needs assistance Equipment used: 2 person hand held assist Transfers: Sit to/from Stand, Bed to chair/wheelchair/BSC Sit to Stand: Max assist, +2 safety/equipment Bed to/from chair/wheelchair/BSC transfer type:: Via Lift equipment Stand pivot transfers: +2 safety/equipment, Max assist Transfer via Lift Equipment: Stedy General transfer comment: Stedy positioned for sit<>stand to trial for bed>chair transfer. Pt able to use Rt UE and LE to power up. manual blocking to prevent Lt lean and support Lt UE. mod-max+2 for stand and to bring paddles down for seated position on Stedy. dependent transfer bed>chair. Pt required increased assist to rise from stedy paddles and control lower to chair.    Ambulation / Gait / Stairs / Wheelchair Mobility  Ambulation/Gait General Gait Details: unable    Posture / Balance Dynamic Sitting Balance Sitting balance - Comments: Posterior and L lateral lean noted with no reactional strategies during LOB, needing mod-maxA consistently for static sitting balance Balance Overall balance assessment: Needs assistance Sitting-balance support: Single  extremity supported, No upper extremity supported, Feet supported Sitting balance-Leahy Scale: Poor Sitting balance - Comments: Posterior and L lateral lean noted with no reactional strategies during LOB, needing mod-maxA consistently for static sitting balance Postural control: Posterior lean, Left lateral lean Standing balance support: Single extremity supported, During functional activity Standing balance-Leahy Scale: Poor Standing balance comment: Pt tends to lean to the L and flex at the hips and L knee in standing, L knee blocked and R UE support with maxA to stand 2x ~10-15 sec each    Special needs/care consideration N/a   Previous Home Environment (from acute  therapy documentation) Living Arrangements: Spouse/significant other Available Help at Discharge: Family, Available 24 hours/day Type of Home: House Home Layout: One level Home Access: Stairs to enter Entrance Stairs-Rails: None Entrance Stairs-Number of Steps: 1 Bathroom Shower/Tub: Health visitor: Standard Home Care Services: No  Discharge Living Setting Plans for Discharge Living Setting: Patient's home, Lives with (comment) (spouse) Type of Home at Discharge: House Discharge Home Layout: One level Discharge Home Access: Stairs to enter Entrance Stairs-Rails: None Entrance Stairs-Number of Steps: 1 Discharge Bathroom Shower/Tub: Walk-in shower Discharge Bathroom Toilet: Standard Discharge Bathroom Accessibility: Yes How Accessible: Accessible via walker Does the patient have any problems obtaining your medications?: No  Social/Family/Support Systems Patient Roles: Spouse Anticipated Caregiver: spouse, Aurea Graff Anticipated Industrial/product designer Information: [phone] Ability/Limitations of Caregiver: min assist mobility/ADLs Caregiver Availability: 24/7 Discharge Plan Discussed with Primary Caregiver: Yes Is Caregiver In Agreement with Plan?: Yes Does Caregiver/Family have Issues with  Lodging/Transportation while Pt is in Rehab?: No  Goals Patient/Family Goal for Rehab: PT/OT min assist w/c level, SLP supervision Expected length of stay: 24-28 days Additional Information: Discharge plan: home with spouse, can provide min assist, likely w/c level Pt/Family Agrees to Admission and willing to participate: Yes Program Orientation Provided & Reviewed with Pt/Caregiver Including Roles  & Responsibilities: Yes  Barriers to Discharge: Insurance for SNF coverage, Home environment access/layout  Decrease burden of Care through IP rehab admission: n/a  Possible need for SNF placement upon discharge: Not anticipated.  Plan home with wife, min assist level, likely w/c.   Patient Condition: I have reviewed medical records from [facility], spoken with  [facility] team , and patient and spouse. I met with patient at the bedside for inpatient rehabilitation assessment.  Patient will benefit from ongoing PT, OT, and SLP, can actively participate in 3 hours of therapy a day 5 days of the week, and can make measurable gains during the admission.  Patient will also benefit from the coordinated team approach during an Inpatient Acute Rehabilitation admission.  The patient will receive intensive therapy as well as Rehabilitation physician, nursing, social worker, and care management interventions.  Due to safety, skin/wound care, disease management, medication administration, pain management, and patient education the patient requires 24 hour a day rehabilitation nursing.  The patient is currently max assist 1-2 with mobility and basic ADLs.  Discharge setting and therapy post discharge at home with home health is anticipated.  Patient has agreed to participate in the Acute Inpatient Rehabilitation Program and will admit {Time; today/tomorrow:10263}.  Preadmission Screen Completed By:  Stephania Fragmin, PT, DPT 11/11/2022 2:48 PM ______________________________________________________________________    Discussed status with Dr. Marland Kitchen on *** at *** and received approval for admission today.  Admission Coordinator:  Stephania Fragmin, PT, DPT time Marland KitchenDorna Bloom ***   Assessment/Plan: Diagnosis: Does the need for close, 24 hr/day Medical supervision in concert with the patient's rehab needs make it unreasonable for this patient to be served in a less intensive setting? {yes_no_potentially:3041433} Co-Morbidities requiring supervision/potential complications: *** Due to {due BJ:4782956}, does the patient require 24 hr/day rehab nursing? {yes_no_potentially:3041433} Does the patient require coordinated care of a physician, rehab nurse, PT, OT, and SLP to address physical and functional deficits in the context of the above medical diagnosis(es)? {yes_no_potentially:3041433} Addressing deficits in the following areas: {deficits:3041436} Can the patient actively participate in an intensive therapy program of at least 3 hrs of therapy 5 days a week? {yes_no_potentially:3041433} The potential for patient to make measurable gains while on inpatient rehab is {  potential:3041437} Anticipated functional outcomes upon discharge from inpatient rehab: {functional outcomes:304600100} PT, {functional outcomes:304600100} OT, {functional outcomes:304600100} SLP Estimated rehab length of stay to reach the above functional goals is: *** Anticipated discharge destination: {anticipated dc setting:21604} 10. Overall Rehab/Functional Prognosis: {potential:3041437}   MD Signature: ***

## 2022-11-11 NOTE — Progress Notes (Signed)
PROGRESS NOTE  Samuel Pearson ZOX:[id number] DOB: 18-May-1945   PCP: Madelin Headings, MD  Patient is from: Home  DOA: 11/08/2022 LOS: 2  Chief complaints Chief Complaint  Patient presents with   Code Stroke     Brief Narrative / Interim history: 77 year old M with PMH of HTN, anxiety, anemia, heart murmur and EtOH abuse presenting with left-sided weakness and found to have acute CVA and right ACA territory as noted on MRI brain.  MRI cervical spine showed severe C3-4 and C4-5 spinal canal stenosis with possible cord signal abnormality concerning for myelomalacia.  Neurology consulted.  Patient admitted for further CVA workup.   CT angio head negative for LVO but moderate to severe multivessel stenosis bilaterally.  TTE without significant finding.  LDL 130.  A1c 5.7%.  Neurology recommended Plavix and aspirin for 3 weeks followed by Plavix alone.  Neurosurgery, Dr. Conchita Paris consulted about cervical spinal stenosis and felt finding to be chronic and recommended outpatient follow-up once he is off Plavix.  Did not feel like there is utility for steroid either.  Encephalopathy resolving.  Therapy recommended CIR.  CIR following.   Subjective: Seen and examined earlier this morning.  No major events overnight of this morning.  No complaints.  He is awake and more alert.  He is feeding himself.   Objective: Vitals:   11/10/22 0354 11/10/22 0453 11/10/22 0724 11/10/22 1109  BP: (!) 185/174 (!) 175/69 (!) 184/58 (!) 156/54  Pulse: 68 80 74 73  Resp: 18  19 15   Temp: 99.5 F (37.5 C)  99 F (37.2 C) 98.9 F (37.2 C)  TempSrc: Oral  Oral Oral  SpO2: 96%  98% 94%  Weight:      Height:        Examination:  GENERAL: No apparent distress.  Nontoxic. HEENT: MMM.  Vision and hearing grossly intact.  NECK: Supple.  No apparent JVD.  RESP:  No IWOB.  Fair aeration bilaterally. CVS:  RRR. Heart sounds normal.  ABD/GI/GU: BS+. Abd soft, NTND.  MSK/EXT:  Moves extremities. No  apparent deformity. No edema.  SKIN: no apparent skin lesion or wound NEURO: Awake and more alert.  Seems to be oriented appropriately.  Follows commands.  Left facial droop and left hemiparesis. PSYCH: Calm. Normal affect.   Procedures:  None  Microbiology summarized: None  Assessment and plan: Principal Problem:   CVA (cerebral vascular accident) [facility]) Active Problems:   Essential hypertension   Macrocytic anemia   Hypocalcemia   Thrombocytopenia (HCC)   Alcohol use   Spinal stenosis  Acute CVA with left hemiparesis: Presents with left-sided weakness.  MRI brain showed acute CVA and right ACA territory.  Outside tPA window on arrival.  CT angio head negative for LVO but moderate to severe multivessel stenosis bilaterally.  TTE without significant finding.  LDL 130.  A1c 5.7%.  Patient has been on daily aspirin. -Neurology recs: -Plavix and aspirin for 3 weeks followed by Plavix alone.  -Prolonged cardiac monitoring at discharge.  Cardiology to place loop recorder before discharge -Aggressive risk reduction -Continue Lipitor. -Therapy recommended CIR.  CIR following.  Acute metabolic encephalopathy: Multifactorial including Ativan, CVA and possible delirium.  EEG and negative for seizure.  TSH slightly elevated.  B12 is 204.  Encephalopathy resolving. -Continue thiamine, multivitamin and folic acid -Received vitamin B12 injection on 7/3.  Continue p.o. vitamin B12. -Reorientation and delirium precautions.  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  He has left-sided weakness with left foot drop. Neurosurgery, Dr. Conchita Paris consulted about cervical spinal stenosis and felt finding to be chronic and recommended outpatient follow-up once he is off Plavix.  Did not feel like there is utility for steroid either. -Outpatient follow-up with neurosurgery, Dr. Conchita Paris after 3 weeks  Alcohol use:  Reportedly drinks 2-3 beers plus glass of wine most nights.  Became very somnolent after receiving Ativan. -Monitor off IV Ativan. -Continue multivitamin, thiamine and folic acid -Encourage moderation or cessation.   Essential hypertension -Permissive hypertension given multiple intracranial stenosis   Macrocytic anemia Vitamin B12 deficiency Recent Labs    01/18/22 1055 11/08/22 0916 11/08/22 0931 11/10/22 0142  HGB 14.5 10.6* 9.5* 12.6*  -Vitamin B12 injection followed by p.o.   Hypocalcemia: Received IV calcium gluconate in ED   Thrombocytopenia: Resolved.  Hyponatremia: Mild -Monitor  Hypokalemia -Monitor replenish as appropriate   Hyperlipidemia: LDL 130 -Statin as above    Body mass index is 25.82 kg/m.           DVT prophylaxis:  SCD's Start: 11/08/22 1439  Code Status: Full code Family Communication: Updated patient's wife at bedside Level of care: Telemetry Medical Status is: Inpatient Remains inpatient appropriate because: Acute CVA, acute metabolic encephalopathy   Final disposition: CIR Consultants:  Neurology Neurosurgery over the phone Cardiology  35 minutes with more than 50% spent in reviewing records, counseling patient/family and coordinating care.   Sch Meds:  Scheduled Meds:  atorvastatin  40 mg Oral Daily   vitamin B-12  1,000 mcg Oral Daily   multivitamin with minerals  1 tablet Oral Daily   thiamine  100 mg Oral Daily   Or   thiamine  100 mg Intravenous Daily   Continuous Infusions:  dextrose 5 % and 0.9 % NaCl 75 mL/hr at 11/10/22 0322   PRN Meds:.acetaminophen **OR** acetaminophen (TYLENOL) oral liquid 160 mg/5 mL **OR** acetaminophen, polyvinyl alcohol  Antimicrobials: Anti-infectives (From admission, onward)    None        I have personally reviewed the following labs and images: CBC: Recent Labs  Lab 11/08/22 0916 11/08/22 0931 11/10/22 0142  WBC 8.3  --  8.5  NEUTROABS 6.8  --   --   HGB 10.6*  9.5* 12.6*  HCT 32.7* 28.0* 38.1*  MCV 103.8*  --  98.7  PLT 141*  --  179   BMP &GFR Recent Labs  Lab 11/08/22 0916 11/08/22 0931 11/08/22 2103 11/09/22 0424 11/10/22 0142  NA 138 147* 136  --  134*  K 3.9 2.8* 3.7  --  3.5  CL 110 115* 106  --  106  CO2 17*  --  20*  --  19*  GLUCOSE 114* 91 130*  --  153*  BUN 13 12 12   --  9  CREATININE 0.79 0.40* 0.77  --  0.80  CALCIUM 8.0*  --  8.5*  --  8.3*  MG  --   --   --  1.9 1.8  PHOS  --   --   --   --  3.1   Estimated Creatinine Clearance: 68.7 mL/min (by C-G formula based on SCr of 0.8 mg/dL). Liver & Pancreas: Recent Labs  Lab 11/08/22 0916 11/10/22 0142  AST 29  --   ALT 20  --   ALKPHOS 47  --   BILITOT 1.0  --   PROT 6.0*  --   ALBUMIN 3.5 3.2*   No results for input(s): "LIPASE", "  AMYLASE" in the last 168 hours. No results for input(s): "AMMONIA" in the last 168 hours. Diabetic: Recent Labs    11/08/22 2103  HGBA1C 5.7*   Recent Labs  Lab 11/08/22 0921  GLUCAP 79   Cardiac Enzymes: Recent Labs  Lab 11/08/22 2103  CKTOTAL 606*   No results for input(s): "PROBNP" in the last 8760 hours. Coagulation Profile: Recent Labs  Lab 11/08/22 0916  INR 1.2   Thyroid Function Tests: Recent Labs    11/08/22 2103  TSH 5.929*   Lipid Profile: Recent Labs    11/08/22 2103  CHOL 183  HDL 39*  LDLCALC 130*  TRIG 70  CHOLHDL 4.7   Anemia Panel: Recent Labs    11/08/22 2103  VITAMINB12 204   Urine analysis:    Component Value Date/Time   COLORURINE YELLOW 02/25/2020 0200   APPEARANCEUR CLEAR 02/25/2020 0200   LABSPEC 1.025 02/25/2020 0200   PHURINE 5.0 02/25/2020 0200   GLUCOSEU NEGATIVE 02/25/2020 0200   HGBUR NEGATIVE 02/25/2020 0200   HGBUR trace-intact 04/13/2009 0855   BILIRUBINUR NEGATIVE 02/25/2020 0200   BILIRUBINUR n 11/23/2011 0836   KETONESUR 20 (A) 02/25/2020 0200   PROTEINUR 30 (A) 02/25/2020 0200   UROBILINOGEN 0.2 11/23/2011 0836   UROBILINOGEN 0.2 04/13/2009 0855    NITRITE NEGATIVE 02/25/2020 0200   LEUKOCYTESUR NEGATIVE 02/25/2020 0200   Sepsis Labs: Invalid input(s): "PROCALCITONIN", "LACTICIDVEN"  Microbiology: No results found for this or any previous visit (from the past 240 hour(s)).  Radiology Studies: No results found.    Wynn Alldredge T. Kailan Carmen [facility]  If 7PM-7AM, please contact night-coverage [email or web address] 11/10/2022, 3:25 PM

## 2022-11-12 DIAGNOSIS — I63521 Cerebral infarction due to unspecified occlusion or stenosis of right anterior cerebral artery: Secondary | ICD-10-CM | POA: Diagnosis not present

## 2022-11-12 DIAGNOSIS — D696 Thrombocytopenia, unspecified: Secondary | ICD-10-CM | POA: Diagnosis not present

## 2022-11-12 DIAGNOSIS — D539 Nutritional anemia, unspecified: Secondary | ICD-10-CM | POA: Diagnosis not present

## 2022-11-12 MED ORDER — SIMETHICONE 80 MG PO CHEW
80.0000 mg | CHEWABLE_TABLET | Freq: Four times a day (QID) | ORAL | Status: DC
  Administered 2022-11-12 – 2022-11-14 (×10): 80 mg via ORAL
  Filled 2022-11-12 (×10): qty 1

## 2022-11-12 MED ORDER — POTASSIUM CHLORIDE 20 MEQ PO PACK
40.0000 meq | PACK | Freq: Once | ORAL | Status: AC
  Administered 2022-11-12: 40 meq via ORAL
  Filled 2022-11-12: qty 2

## 2022-11-12 NOTE — Plan of Care (Signed)
  Problem: Education: Goal: Knowledge of disease or condition will improve Outcome: Progressing   Problem: Coping: Goal: Will identify appropriate support needs Outcome: Progressing   Problem: Self-Care: Goal: Ability to communicate needs accurately will improve Outcome: Progressing   Problem: Nutrition: Goal: Risk of aspiration will decrease Outcome: Progressing Goal: Dietary intake will improve Outcome: Progressing   Problem: Education: Goal: Knowledge of General Education information will improve Description: Including pain rating scale, medication(s)/side effects and non-pharmacologic comfort measures Outcome: Progressing   

## 2022-11-12 NOTE — Evaluation (Signed)
Speech Language Pathology Evaluation Patient Details Name: Samuel Pearson MRN: [id number] DOB: 04-15-1946 Today's Date: 11/12/2022 Time: 4098-1191 SLP Time Calculation (min) (ACUTE ONLY): 28 min  Problem List:  Patient Active Problem List   Diagnosis Date Noted   CVA (cerebral vascular accident) (HCC) 11/08/2022   Macrocytic anemia 11/08/2022   Hypocalcemia 11/08/2022   Thrombocytopenia (HCC) 11/08/2022   Alcohol use 11/08/2022   Spinal stenosis 11/08/2022   Contact dermatitis due to chemicals 03/17/2022   Aortic valve regurgitation 01/18/2022   Periorbital dermatitis 12/13/2021   Food intolerance 12/13/2021   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   Acute cholecystitis 02/25/2020   Leukocytosis 02/25/2020   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/24/2020   Essential hypertension 05/17/2014   Low HDL (under 40) 05/17/2014   Fasting hyperglycemia 05/17/2014   Colon cancer screening 03/18/2013   HTN, white coat 03/18/2013   Medication management 12/03/2011   Heart murmur 12/03/2011   Dermatochalasis of eyelid 09/14/2011   Meibomian gland dysfunction 09/14/2011   Posterior vitreous detachment 09/14/2011   Postsurgical retinal scar 09/14/2011   Pseudophakia 09/14/2011   Retinal tear 09/14/2011   Visit for preventive health examination 08/29/2010   Other and unspecified hyperlipidemia 08/29/2010   Abnormal RBC 07/28/2010   Screening PSA (prostate specific antigen) 07/28/2010   HYPERTENSION 01/21/2008   Other allergic rhinitis 01/21/2008   HYPERGLYCEMIA 01/21/2008   Past Medical History:  Past Medical History:  Diagnosis Date   Allergic rhinitis    Anxiety    Detached retina    Heart murmur    has had one when a child and checked out ok  never? 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  elevated MCV  No other sx    Past Surgical History:  Past  Surgical History:  Procedure Laterality Date   CATARACT EXTRACTION  2009   [facility]   CHOLECYSTECTOMY N/A 02/25/2020   Procedure: LAPAROSCOPIC CHOLECYSTECTOMY;  Surgeon: Harriette Bouillon, MD;  Location: [facility] OR;  Service: General;  Laterality: N/A;   HPI:  Pt is a 77 y.o. male who presented 11/08/22 with L leg weakness and back pain. Pt also fell PTA. 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 PMH: heart murmur, HTN, anemia, anxiety   Assessment / Plan / Recommendation Clinical Impression  Pt presents with novel deficits in cognition s/p CVA appearing more moderate in nature. PLOF per spouse and pt report, pt was independent with all ADLs. [employer] Mental Status (SLUMS) was administered 11/30 revealing deficits in executive function, recall, attention (left sided neglect with right gaze preference), thought processing, and problem solving. Pt appears to have reduced awareness of novel defciits but was stimulable for improved cognitive skills with interventions and cueing from SLP. Speech and language as well as motor speech skills appeared grossly intact. Pt  does require increased processing time for responding to questions but was fluent with extra time. Will continue to follow for deficits noted above to maximize safety and independence.    SLP Assessment  SLP Recommendation/Assessment: Patient needs continued Speech Lanaguage Pathology Services SLP Visit Diagnosis: Cognitive communication deficit (R41.841)    Recommendations for follow up therapy are one component of a multi-disciplinary discharge planning process,  led by the attending physician.  Recommendations may be updated based on patient status, additional functional criteria and insurance authorization.    Follow Up Recommendations  Acute inpatient rehab (3hours/day)     Assistance Recommended at Discharge  Intermittent Supervision/Assistance  Functional Status Assessment Patient has had a recent decline in their functional status and demonstrates the ability to make significant improvements in function in a reasonable and predictable amount of time.  Frequency and Duration min 2x/week  2 weeks      SLP Evaluation Cognition  Overall Cognitive Status: Impaired/Different from baseline Arousal/Alertness: Awake/alert Orientation Level: Oriented to person;Disoriented to time;Oriented to place;Disoriented to situation (disoriented to day of the month) Year: 2024 Month: July Day of Week: Correct Attention: Focused;Sustained Focused Attention: Impaired Focused Attention Impairment: Verbal basic;Verbal complex;Functional basic Sustained Attention: Impaired Memory: Impaired Memory Impairment: Decreased short term memory;Decreased recall of new information Awareness: Impaired Problem Solving: Impaired Problem Solving Impairment: Verbal complex;Functional basic;Functional complex Executive Function: Organizing;Self Monitoring Organizing: Impaired Self Monitoring: Impaired Safety/Judgment: Impaired       Comprehension  Auditory Comprehension Overall Auditory Comprehension: Appears within functional limits for tasks assessed (needs increased processing time however)    Expression Expression Primary Mode of Expression: Verbal Verbal Expression Overall Verbal Expression: Other (comment) (requires repetition and increased processing time) Written Expression Dominant Hand: Right   Oral / Motor  Oral Motor/Sensory Function Overall Oral Motor/Sensory Function: Within functional limits Motor Speech Overall Motor Speech: Appears within functional limits for tasks assessed           Ardyth Gal MA, CCC-SLP Acute Rehabilitation Services   11/12/2022, 3:46 PM

## 2022-11-12 NOTE — Progress Notes (Signed)
PROGRESS NOTE  Samuel Pearson EAV:[id number] DOB: August 02, 1945   PCP: Madelin Headings, MD  Patient is from: Home  DOA: 11/08/2022 LOS: 4  Chief complaints Chief Complaint  Patient presents with   Code Stroke     Brief Narrative / Interim history: 77 year old M with PMH of HTN, anxiety, anemia, heart murmur and EtOH abuse presenting with left-sided weakness and found to have acute CVA and right ACA territory as noted on MRI brain.  MRI cervical spine showed severe C3-4 and C4-5 spinal canal stenosis with possible cord signal abnormality concerning for myelomalacia.  Neurology consulted.  Patient admitted for further CVA workup.   CT angio head negative for LVO but moderate to severe multivessel stenosis bilaterally.  TTE without significant finding.  LDL 130.  A1c 5.7%.  Neurology recommended Plavix and aspirin for 3 weeks followed by Plavix alone.  Neurosurgery, Dr. Conchita Paris consulted about cervical spinal stenosis and felt finding to be chronic and recommended outpatient follow-up once he is off Plavix.  Did not feel like there is utility for steroid either.  Encephalopathy resolved.  Therapy recommended CIR.  CIR following.  Peer to peer review done on 7/5.   Subjective: Seen and examined earlier this morning.  No major events overnight of this morning.  Complaining of abdominal distention and discomfort.  Reportedly had bowel movement earlier this morning.  Denies nausea or vomiting.  Objective: Vitals:   11/11/22 0855 11/11/22 1322 11/11/22 1538 11/12/22 0743  BP: (!) 156/56 (!) 176/52 (!) 159/63 (!) 163/49  Pulse: 76 75 76 70  Resp: 16 16 18 19   Temp: 97.7 F (36.5 C) 97.6 F (36.4 C) 97.7 F (36.5 C) 98.3 F (36.8 C)  TempSrc: Oral Oral Oral Oral  SpO2: 95% 95% 94% 95%  Weight:      Height:        Examination:  GENERAL: No apparent distress.  Nontoxic. HEENT: MMM.  Vision and hearing grossly intact.  NECK: Supple.  No apparent JVD.  RESP:  No IWOB.  Fair  aeration bilaterally. CVS:  RRR. Heart sounds normal.  ABD/GI/GU: BS+. Abd distended.  Nontender. MSK/EXT: Left hemiparesis.  No apparent deformity. SKIN: no apparent skin lesion or wound NEURO: Awake and oriented x 4.  Follows commands.  Left facial droop and left hemiparesis.  PSYCH: Calm. Normal affect.   Procedures:  None  Microbiology summarized: None  Assessment and plan: Principal Problem:   CVA (cerebral vascular accident) [facility]) Active Problems:   Essential hypertension   Macrocytic anemia   Hypocalcemia   Thrombocytopenia (HCC)   Alcohol use   Spinal stenosis  Acute CVA with left hemiparesis: Presents with left-sided weakness.  MRI brain showed acute CVA and right ACA territory.  Outside tPA window on arrival.  CT angio head negative for LVO but moderate to severe multivessel stenosis bilaterally.  TTE without significant finding.  LDL 130.  A1c 5.7%.  Patient has been on daily aspirin. -Neurology recs: -Plavix and aspirin for 3 weeks followed by Plavix alone.  -Prolonged cardiac monitoring at discharge.  Cardiology to place loop recorder before discharge -Aggressive risk reduction -Continue Lipitor. -Therapy recommended CIR.  CIR following. -Peer to peer review completed on 7/5.  Acute metabolic encephalopathy: Multifactorial including Ativan, CVA and possible delirium.  EEG and negative for seizure.  TSH slightly elevated.  B12 is 204.  Encephalopathy resolved. -Continue thiamine, multivitamin and folic acid -Received vitamin B12 injection on 7/3.  Continue p.o. vitamin B12. -Reorientation and delirium precautions.  Severe  spinal canal stenosis with possible myelomalacia: Currently difficult to tell if he is symptomatic from this cholesterol.  He has left-sided weakness with left foot drop. Neurosurgery, Dr. Conchita Paris consulted about cervical spinal stenosis and felt finding to be chronic and recommended outpatient follow-up once he is off Plavix.  Did not feel like  there is utility for steroid either. -Outpatient follow-up with neurosurgery, Dr. Conchita Paris after 3 weeks  Abdominal distention: Reportedly had a bowel movement earlier this morning.  Nontender.  No nausea or vomiting. -Start simethicone.  Alcohol use: Reportedly drinks 2-3 beers plus glass of wine most nights.  Became very somnolent after receiving Ativan. -Monitor off IV Ativan. -Continue multivitamin, thiamine and folic acid -Encourage moderation or cessation.   Essential hypertension -Permissive hypertension given multiple intracranial stenosis   Macrocytic anemia Vitamin B12 deficiency Recent Labs    01/18/22 1055 11/08/22 0916 11/08/22 0931 11/10/22 0142  HGB 14.5 10.6* 9.5* 12.6*  -Vitamin B12 injection followed by p.o.   Hypocalcemia: Received IV calcium gluconate in ED   Thrombocytopenia: Resolved.  Hyponatremia: Mild -Monitor  Hypokalemia -Monitor replenish as appropriate   Hyperlipidemia: LDL 130 -Statin as above    Body mass index is 25.82 kg/m.           DVT prophylaxis:  SCD's Start: 11/08/22 1439  Code Status: Full code Family Communication: Updated patient's wife at bedside Level of care: Telemetry Medical Status is: Inpatient Remains inpatient appropriate because: Acute CVA and safe disposition   Final disposition: CIR Consultants:  Neurology Neurosurgery over the phone Cardiology  35 minutes with more than 50% spent in reviewing records, counseling patient/family and coordinating care.   Sch Meds:  Scheduled Meds:  atorvastatin  40 mg Oral Daily   vitamin B-12  1,000 mcg Oral Daily   multivitamin with minerals  1 tablet Oral Daily   simethicone  80 mg Oral QID   thiamine  100 mg Oral Daily   Or   thiamine  100 mg Intravenous Daily   Continuous Infusions:   PRN Meds:.acetaminophen **OR** acetaminophen (TYLENOL) oral liquid 160 mg/5 mL **OR** acetaminophen, polyvinyl alcohol  Antimicrobials: Anti-infectives (From  admission, onward)    None        I have personally reviewed the following labs and images: CBC: Recent Labs  Lab 11/08/22 0916 11/08/22 0931 11/10/22 0142  WBC 8.3  --  8.5  NEUTROABS 6.8  --   --   HGB 10.6* 9.5* 12.6*  HCT 32.7* 28.0* 38.1*  MCV 103.8*  --  98.7  PLT 141*  --  179   BMP &GFR Recent Labs  Lab 11/08/22 0916 11/08/22 0931 11/08/22 2103 11/09/22 0424 11/10/22 0142 11/11/22 0532  NA 138 147* 136  --  134* 135  K 3.9 2.8* 3.7  --  3.5 3.5  CL 110 115* 106  --  106 107  CO2 17*  --  20*  --  19* 21*  GLUCOSE 114* 91 130*  --  153* 132*  BUN 13 12 12   --  9 8  CREATININE 0.79 0.40* 0.77  --  0.80 0.77  CALCIUM 8.0*  --  8.5*  --  8.3* 8.1*  MG  --   --   --  1.9 1.8 1.9  PHOS  --   --   --   --  3.1 2.3*   Estimated Creatinine Clearance: 68.7 mL/min (by C-G formula based on SCr of 0.77 mg/dL). Liver & Pancreas: Recent Labs  Lab 11/08/22 9604 11/10/22  0142 11/11/22 0532  AST 29  --   --   ALT 20  --   --   ALKPHOS 47  --   --   BILITOT 1.0  --   --   PROT 6.0*  --   --   ALBUMIN 3.5 3.2* 3.0*   No results for input(s): "LIPASE", "AMYLASE" in the last 168 hours. Recent Labs  Lab 11/11/22 0532  AMMONIA 26   Diabetic: No results for input(s): "HGBA1C" in the last 72 hours.  Recent Labs  Lab 11/08/22 0921  GLUCAP 79   Cardiac Enzymes: Recent Labs  Lab 11/08/22 2103  CKTOTAL 606*   No results for input(s): "PROBNP" in the last 8760 hours. Coagulation Profile: Recent Labs  Lab 11/08/22 0916  INR 1.2   Thyroid Function Tests: Recent Labs    11/11/22 0532  TSH 8.104*  FREET4 0.83   Lipid Profile: No results for input(s): "CHOL", "HDL", "LDLCALC", "TRIG", "CHOLHDL", "LDLDIRECT" in the last 72 hours.  Anemia Panel: No results for input(s): "VITAMINB12", "FOLATE", "FERRITIN", "TIBC", "IRON", "RETICCTPCT" in the last 72 hours.  Urine analysis:    Component Value Date/Time   COLORURINE YELLOW 02/25/2020 0200    APPEARANCEUR CLEAR 02/25/2020 0200   LABSPEC 1.025 02/25/2020 0200   PHURINE 5.0 02/25/2020 0200   GLUCOSEU NEGATIVE 02/25/2020 0200   HGBUR NEGATIVE 02/25/2020 0200   HGBUR trace-intact 04/13/2009 0855   BILIRUBINUR NEGATIVE 02/25/2020 0200   BILIRUBINUR n 11/23/2011 0836   KETONESUR 20 (A) 02/25/2020 0200   PROTEINUR 30 (A) 02/25/2020 0200   UROBILINOGEN 0.2 11/23/2011 0836   UROBILINOGEN 0.2 04/13/2009 0855   NITRITE NEGATIVE 02/25/2020 0200   LEUKOCYTESUR NEGATIVE 02/25/2020 0200   Sepsis Labs: Invalid input(s): "PROCALCITONIN", "LACTICIDVEN"  Microbiology: No results found for this or any previous visit (from the past 240 hour(s)).  Radiology Studies: No results found.    Chanc Kervin T. Zana Biancardi [facility]  If 7PM-7AM, please contact night-coverage [email or web address] 11/12/2022, 11:19 AM

## 2022-11-13 ENCOUNTER — Inpatient Hospital Stay (HOSPITAL_COMMUNITY): Payer: Medicare Other

## 2022-11-13 DIAGNOSIS — D696 Thrombocytopenia, unspecified: Secondary | ICD-10-CM | POA: Diagnosis not present

## 2022-11-13 DIAGNOSIS — D539 Nutritional anemia, unspecified: Secondary | ICD-10-CM | POA: Diagnosis not present

## 2022-11-13 DIAGNOSIS — I63521 Cerebral infarction due to unspecified occlusion or stenosis of right anterior cerebral artery: Secondary | ICD-10-CM | POA: Diagnosis not present

## 2022-11-13 LAB — CBC
HCT: 33.6 % — ABNORMAL LOW (ref 39.0–52.0)
Hemoglobin: 11.5 g/dL — ABNORMAL LOW (ref 13.0–17.0)
MCH: 32 pg (ref 26.0–34.0)
MCHC: 34.2 g/dL (ref 30.0–36.0)
MCV: 93.6 fL (ref 80.0–100.0)
Platelets: 176 10*3/uL (ref 150–400)
RBC: 3.59 MIL/uL — ABNORMAL LOW (ref 4.22–5.81)
RDW: 12.3 % (ref 11.5–15.5)
WBC: 10.9 10*3/uL — ABNORMAL HIGH (ref 4.0–10.5)
nRBC: 0 % (ref 0.0–0.2)

## 2022-11-13 LAB — RENAL FUNCTION PANEL
Albumin: 3 g/dL — ABNORMAL LOW (ref 3.5–5.0)
Anion gap: 8 (ref 5–15)
BUN: 12 mg/dL (ref 8–23)
CO2: 20 mmol/L — ABNORMAL LOW (ref 22–32)
Calcium: 8.1 mg/dL — ABNORMAL LOW (ref 8.9–10.3)
Chloride: 105 mmol/L (ref 98–111)
Creatinine, Ser: 0.69 mg/dL (ref 0.61–1.24)
GFR, Estimated: >60 mL/min (ref >60)
Glucose, Bld: 115 mg/dL — ABNORMAL HIGH (ref 70–99)
Phosphorus: 3.4 mg/dL (ref 2.5–4.6)
Potassium: 3.5 mmol/L (ref 3.5–5.1)
Sodium: 133 mmol/L — ABNORMAL LOW (ref 135–145)

## 2022-11-13 LAB — MAGNESIUM: Magnesium: 1.9 mg/dL (ref 1.7–2.4)

## 2022-11-13 MED ORDER — PANTOPRAZOLE SODIUM 40 MG PO TBEC
40.0000 mg | DELAYED_RELEASE_TABLET | Freq: Every day | ORAL | Status: DC
  Administered 2022-11-13 – 2022-11-14 (×2): 40 mg via ORAL
  Filled 2022-11-13 (×2): qty 1

## 2022-11-13 MED ORDER — POTASSIUM CHLORIDE 20 MEQ PO PACK
40.0000 meq | PACK | Freq: Once | ORAL | Status: AC
  Administered 2022-11-13: 40 meq via ORAL
  Filled 2022-11-13: qty 2

## 2022-11-13 NOTE — Progress Notes (Signed)
PROGRESS NOTE  Samuel Pearson ZOX:[id number] DOB: 10-11-1945   PCP: Madelin Headings, MD  Patient is from: Home  DOA: 11/08/2022 LOS: 5  Chief complaints Chief Complaint  Patient presents with   Code Stroke     Brief Narrative / Interim history: 77 year old M with PMH of HTN, anxiety, anemia, heart murmur and EtOH abuse presenting with left-sided weakness and found to have acute CVA and right ACA territory as noted on MRI brain.  MRI cervical spine showed severe C3-4 and C4-5 spinal canal stenosis with possible cord signal abnormality concerning for myelomalacia.  Neurology consulted.  Patient admitted for further CVA workup.   CT angio head negative for LVO but moderate to severe multivessel stenosis bilaterally.  TTE without significant finding.  LDL 130.  A1c 5.7%.  Neurology recommended Plavix and aspirin for 3 weeks followed by Plavix alone.  Neurosurgery, Dr. Conchita Paris consulted about cervical spinal stenosis and felt finding to be chronic and recommended outpatient follow-up once he is off Plavix.  Did not feel like there is utility for steroid either.  Encephalopathy resolved.  Therapy recommended CIR.  CIR following.  Peer to peer review done on 7/5.   Subjective: Seen and examined earlier this morning.  No major events overnight of this morning.  Abdomen remains distended but has diarrhea x 2 this morning.  Passing flatus as well.  KUB concerning for ileus.  Objective: Vitals:   11/13/22 0007 11/13/22 0338 11/13/22 0752 11/13/22 1135  BP: (!) 166/55 (!) 175/58 (!) 140/47 (!) 160/53  Pulse: 66 77 (!) 57 66  Resp: 16 18 20 19   Temp: 98.7 F (37.1 C) 98.8 F (37.1 C) 99 F (37.2 C) 98.9 F (37.2 C)  TempSrc: Oral Oral Oral Oral  SpO2: 96% 97% 94% 95%  Weight:      Height:        Examination:  GENERAL: No apparent distress.  Nontoxic. HEENT: MMM.  Vision and hearing grossly intact.  NECK: Supple.  No apparent JVD.  RESP:  No IWOB.  Fair aeration  bilaterally. CVS:  RRR. Heart sounds normal.  ABD/GI/GU: BS+. Abd less distended.  Nontender. MSK/EXT: Left hemiparesis.  No apparent deformity. SKIN: no apparent skin lesion or wound NEURO: Awake and oriented x 4.  Follows commands.  Left hemiparesis.  Subtle left facial droop PSYCH: Calm. Normal affect.   Procedures:  None  Microbiology summarized: None  Assessment and plan: Principal Problem:   CVA (cerebral vascular accident) [facility]) Active Problems:   Essential hypertension   Macrocytic anemia   Hypocalcemia   Thrombocytopenia (HCC)   Alcohol use   Spinal stenosis  Acute CVA with left hemiparesis: Presents with left-sided weakness.  MRI brain showed acute CVA and right ACA territory.  Outside tPA window on arrival.  CT angio head negative for LVO but moderate to severe multivessel stenosis bilaterally.  TTE without significant finding.  LDL 130.  A1c 5.7%.  Patient has been on daily aspirin. -Neurology recs: -Plavix and aspirin for 3 weeks followed by Plavix alone.  -Prolonged cardiac monitoring at discharge.  Cardiology to place loop recorder before discharge -Aggressive risk reduction -Continue Lipitor. -Therapy recommended CIR.  CIR following. -Peer to peer review completed on 7/5.  Acute metabolic encephalopathy: Multifactorial including Ativan, CVA and possible delirium.  EEG and negative for seizure.  TSH slightly elevated.  B12 is 204.  Encephalopathy resolved. -Continue thiamine, multivitamin and folic acid -Received vitamin B12 injection on 7/3.  Continue p.o. vitamin B12. -Reorientation and delirium  precautions.  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  He has left-sided weakness with left foot drop. Neurosurgery, Dr. Conchita Paris consulted about cervical spinal stenosis and felt finding to be chronic and recommended outpatient follow-up once he is off Plavix.  Did not feel like  there is utility for steroid either. -Outpatient follow-up with neurosurgery, Dr. Conchita Paris after 3 weeks  Abdominal distention/ileus: Abdomen less distended today.  KUB compatible with ileus.  Had 2 loose bowel movements earlier this morning. -Continue simethicone -Start PPI  Alcohol use: Reportedly drinks 2-3 beers plus glass of wine most nights.  Became very somnolent after receiving Ativan. -Monitor off IV Ativan. -Continue multivitamin, thiamine and folic acid -Encourage moderation or cessation.   Essential hypertension -Permissive hypertension given multiple intracranial stenosis   Macrocytic anemia/Vitamin B12 deficiency Recent Labs    01/18/22 1055 11/08/22 0916 11/08/22 0931 11/10/22 0142 11/13/22 0525  HGB 14.5 10.6* 9.5* 12.6* 11.5*  -Vitamin B12 injection followed by p.o.   Hypocalcemia: Received IV calcium gluconate in ED   Thrombocytopenia: Resolved.  Hyponatremia: Mild -Monitor  Hypokalemia -Monitor replenish as appropriate   Hyperlipidemia: LDL 130 -Statin as above  Thyroid nodule: TSH slightly elevated but free T4 within normal. -Need outpatient PFT in 4 to 6 weeks -Thyroid ultrasound outpatient    Body mass index is 25.82 kg/m.           DVT prophylaxis:  SCD's Start: 11/08/22 1439  Code Status: Full code Family Communication: Updated patient's wife at bedside Level of care: Telemetry Medical Status is: Inpatient Remains inpatient appropriate because: Acute CVA and safe disposition   Final disposition: CIR Consultants:  Neurology Neurosurgery over the phone Cardiology  35 minutes with more than 50% spent in reviewing records, counseling patient/family and coordinating care.   Sch Meds:  Scheduled Meds:  atorvastatin  40 mg Oral Daily   vitamin B-12  1,000 mcg Oral Daily   multivitamin with minerals  1 tablet Oral Daily   pantoprazole  40 mg Oral Daily   simethicone  80 mg Oral QID   thiamine  100 mg Oral Daily   Or    thiamine  100 mg Intravenous Daily   Continuous Infusions:   PRN Meds:.acetaminophen **OR** acetaminophen (TYLENOL) oral liquid 160 mg/5 mL **OR** acetaminophen, polyvinyl alcohol  Antimicrobials: Anti-infectives (From admission, onward)    None        I have personally reviewed the following labs and images: CBC: Recent Labs  Lab 11/08/22 0916 11/08/22 0931 11/10/22 0142 11/13/22 0525  WBC 8.3  --  8.5 10.9*  NEUTROABS 6.8  --   --   --   HGB 10.6* 9.5* 12.6* 11.5*  HCT 32.7* 28.0* 38.1* 33.6*  MCV 103.8*  --  98.7 93.6  PLT 141*  --  179 176   BMP &GFR Recent Labs  Lab 11/08/22 0916 11/08/22 0931 11/08/22 2103 11/09/22 0424 11/10/22 0142 11/11/22 0532 11/13/22 0525  NA 138 147* 136  --  134* 135 133*  K 3.9 2.8* 3.7  --  3.5 3.5 3.5  CL 110 115* 106  --  106 107 105  CO2 17*  --  20*  --  19* 21* 20*  GLUCOSE 114* 91 130*  --  153* 132* 115*  BUN 13 12 12   --  9 8 12   CREATININE 0.79 0.40* 0.77  --  0.80 0.77 0.69  CALCIUM 8.0*  --  8.5*  --  8.3* 8.1* 8.1*  MG  --   --   --  1.9 1.8 1.9 1.9  PHOS  --   --   --   --  3.1 2.3* 3.4   Estimated Creatinine Clearance: 68.7 mL/min (by C-G formula based on SCr of 0.69 mg/dL). Liver & Pancreas: Recent Labs  Lab 11/08/22 0916 11/10/22 0142 11/11/22 0532 11/13/22 0525  AST 29  --   --   --   ALT 20  --   --   --   ALKPHOS 47  --   --   --   BILITOT 1.0  --   --   --   PROT 6.0*  --   --   --   ALBUMIN 3.5 3.2* 3.0* 3.0*   No results for input(s): "LIPASE", "AMYLASE" in the last 168 hours. Recent Labs  Lab 11/11/22 0532  AMMONIA 26   Diabetic: No results for input(s): "HGBA1C" in the last 72 hours.  Recent Labs  Lab 11/08/22 0921  GLUCAP 79   Cardiac Enzymes: Recent Labs  Lab 11/08/22 2103  CKTOTAL 606*   No results for input(s): "PROBNP" in the last 8760 hours. Coagulation Profile: Recent Labs  Lab 11/08/22 0916  INR 1.2   Thyroid Function Tests: Recent Labs    11/11/22 0532   TSH 8.104*  FREET4 0.83   Lipid Profile: No results for input(s): "CHOL", "HDL", "LDLCALC", "TRIG", "CHOLHDL", "LDLDIRECT" in the last 72 hours.  Anemia Panel: No results for input(s): "VITAMINB12", "FOLATE", "FERRITIN", "TIBC", "IRON", "RETICCTPCT" in the last 72 hours.  Urine analysis:    Component Value Date/Time   COLORURINE YELLOW 02/25/2020 0200   APPEARANCEUR CLEAR 02/25/2020 0200   LABSPEC 1.025 02/25/2020 0200   PHURINE 5.0 02/25/2020 0200   GLUCOSEU NEGATIVE 02/25/2020 0200   HGBUR NEGATIVE 02/25/2020 0200   HGBUR trace-intact 04/13/2009 0855   BILIRUBINUR NEGATIVE 02/25/2020 0200   BILIRUBINUR n 11/23/2011 0836   KETONESUR 20 (A) 02/25/2020 0200   PROTEINUR 30 (A) 02/25/2020 0200   UROBILINOGEN 0.2 11/23/2011 0836   UROBILINOGEN 0.2 04/13/2009 0855   NITRITE NEGATIVE 02/25/2020 0200   LEUKOCYTESUR NEGATIVE 02/25/2020 0200   Sepsis Labs: Invalid input(s): "PROCALCITONIN", "LACTICIDVEN"  Microbiology: No results found for this or any previous visit (from the past 240 hour(s)).  Radiology Studies: DG Abd Portable 1V  Result Date: 11/13/2022 CLINICAL DATA:  Abdominal distension EXAM: PORTABLE ABDOMEN - 1 VIEW COMPARISON:  Abdominal CT 02/25/2020 FINDINGS: Diffuse gaseous distension of bowel suggesting adynamic ileus, reaching the rectum. No abnormal stool retention. No concerning mass effect or calcification. Cholecystectomy clips. Cardiomegaly. Clear lung bases. Artifact from EKG leads. IMPRESSION: Generalized gaseous distension of bowel compatible with ileus. Electronically Signed   By: Tiburcio Pea M.D.   On: 11/13/2022 10:55      Parry Po T. Terree Gaultney [facility]  If 7PM-7AM, please contact night-coverage [email or web address] 11/13/2022, 12:22 PM

## 2022-11-13 NOTE — Plan of Care (Signed)
  Problem: Education: Goal: Knowledge of disease or condition will improve Outcome: Progressing Goal: Knowledge of secondary prevention will improve (MUST DOCUMENT ALL) Outcome: Progressing Goal: Knowledge of patient specific risk factors will improve (Mark N/A or DELETE if not current risk factor) Outcome: Progressing   Problem: Ischemic Stroke/TIA Tissue Perfusion: Goal: Complications of ischemic stroke/TIA will be minimized Outcome: Progressing   

## 2022-11-13 NOTE — Plan of Care (Signed)
  Problem: Coping: Goal: Will identify appropriate support needs Outcome: Progressing   Problem: Health Behavior/Discharge Planning: Goal: Goals will be collaboratively established with patient/family Outcome: Progressing   Problem: Nutrition: Goal: Risk of aspiration will decrease Outcome: Progressing Goal: Dietary intake will improve Outcome: Progressing   Problem: Education: Goal: Knowledge of General Education information will improve Description: Including pain rating scale, medication(s)/side effects and non-pharmacologic comfort measures Outcome: Progressing   Problem: Clinical Measurements: Goal: Respiratory complications will improve Outcome: Progressing Goal: Cardiovascular complication will be avoided Outcome: Progressing

## 2022-11-13 NOTE — Progress Notes (Signed)
Pt abd remains distended and taut, turned pt on left side able to expel small amount of flatus, also bladder scanned pt as no urine noted in drainage bag of condom cath--3 consecutive scan 141 ml, 136 ml, 131 ml. SRP, RN

## 2022-11-14 ENCOUNTER — Inpatient Hospital Stay (HOSPITAL_COMMUNITY)
Admission: RE | Admit: 2022-11-14 | Discharge: 2022-12-09 | DRG: 056 | Disposition: A | Discharge status: Skilled Nursing Facility | Payer: Medicare Other | Source: Intra-hospital | Attending: Physical Medicine & Rehabilitation | Admitting: Physical Medicine & Rehabilitation

## 2022-11-14 ENCOUNTER — Encounter (HOSPITAL_COMMUNITY)
Admission: EM | Disposition: A | Discharge status: Rehabilitation Facility | Payer: Self-pay | Source: Home / Self Care | Attending: Student

## 2022-11-14 ENCOUNTER — Other Ambulatory Visit: Payer: Self-pay

## 2022-11-14 ENCOUNTER — Encounter (HOSPITAL_COMMUNITY): Payer: Self-pay | Admitting: Physical Medicine & Rehabilitation

## 2022-11-14 DIAGNOSIS — G8190 Hemiplegia, unspecified affecting unspecified side: Secondary | ICD-10-CM | POA: Diagnosis not present

## 2022-11-14 DIAGNOSIS — F1010 Alcohol abuse, uncomplicated: Secondary | ICD-10-CM | POA: Diagnosis present

## 2022-11-14 DIAGNOSIS — I63521 Cerebral infarction due to unspecified occlusion or stenosis of right anterior cerebral artery: Secondary | ICD-10-CM | POA: Diagnosis not present

## 2022-11-14 DIAGNOSIS — I69328 Other speech and language deficits following cerebral infarction: Secondary | ICD-10-CM | POA: Diagnosis not present

## 2022-11-14 DIAGNOSIS — K560 Paralytic ileus: Secondary | ICD-10-CM | POA: Diagnosis not present

## 2022-11-14 DIAGNOSIS — I10 Essential (primary) hypertension: Secondary | ICD-10-CM | POA: Diagnosis present

## 2022-11-14 DIAGNOSIS — K567 Ileus, unspecified: Secondary | ICD-10-CM | POA: Diagnosis not present

## 2022-11-14 DIAGNOSIS — Z7401 Bed confinement status: Secondary | ICD-10-CM | POA: Diagnosis not present

## 2022-11-14 DIAGNOSIS — F419 Anxiety disorder, unspecified: Secondary | ICD-10-CM | POA: Diagnosis present

## 2022-11-14 DIAGNOSIS — I69354 Hemiplegia and hemiparesis following cerebral infarction affecting left non-dominant side: Secondary | ICD-10-CM | POA: Diagnosis not present

## 2022-11-14 DIAGNOSIS — Z743 Need for continuous supervision: Secondary | ICD-10-CM | POA: Diagnosis not present

## 2022-11-14 DIAGNOSIS — R159 Full incontinence of feces: Secondary | ICD-10-CM | POA: Diagnosis present

## 2022-11-14 DIAGNOSIS — E041 Nontoxic single thyroid nodule: Secondary | ICD-10-CM | POA: Diagnosis not present

## 2022-11-14 DIAGNOSIS — J309 Allergic rhinitis, unspecified: Secondary | ICD-10-CM | POA: Diagnosis not present

## 2022-11-14 DIAGNOSIS — D696 Thrombocytopenia, unspecified: Secondary | ICD-10-CM | POA: Diagnosis not present

## 2022-11-14 DIAGNOSIS — D519 Vitamin B12 deficiency anemia, unspecified: Secondary | ICD-10-CM | POA: Diagnosis present

## 2022-11-14 DIAGNOSIS — I639 Cerebral infarction, unspecified: Secondary | ICD-10-CM | POA: Diagnosis present

## 2022-11-14 DIAGNOSIS — Z9049 Acquired absence of other specified parts of digestive tract: Secondary | ICD-10-CM

## 2022-11-14 DIAGNOSIS — R140 Abdominal distension (gaseous): Secondary | ICD-10-CM | POA: Diagnosis not present

## 2022-11-14 DIAGNOSIS — D510 Vitamin B12 deficiency anemia due to intrinsic factor deficiency: Secondary | ICD-10-CM | POA: Diagnosis present

## 2022-11-14 DIAGNOSIS — G9589 Other specified diseases of spinal cord: Secondary | ICD-10-CM | POA: Diagnosis present

## 2022-11-14 DIAGNOSIS — I82402 Acute embolism and thrombosis of unspecified deep veins of left lower extremity: Secondary | ICD-10-CM | POA: Diagnosis not present

## 2022-11-14 DIAGNOSIS — G9341 Metabolic encephalopathy: Secondary | ICD-10-CM | POA: Diagnosis present

## 2022-11-14 DIAGNOSIS — M4802 Spinal stenosis, cervical region: Secondary | ICD-10-CM | POA: Diagnosis present

## 2022-11-14 DIAGNOSIS — I63529 Cerebral infarction due to unspecified occlusion or stenosis of unspecified anterior cerebral artery: Secondary | ICD-10-CM | POA: Diagnosis not present

## 2022-11-14 DIAGNOSIS — L270 Generalized skin eruption due to drugs and medicaments taken internally: Secondary | ICD-10-CM | POA: Diagnosis not present

## 2022-11-14 DIAGNOSIS — G992 Myelopathy in diseases classified elsewhere: Secondary | ICD-10-CM

## 2022-11-14 DIAGNOSIS — R2689 Other abnormalities of gait and mobility: Secondary | ICD-10-CM | POA: Diagnosis not present

## 2022-11-14 DIAGNOSIS — R1312 Dysphagia, oropharyngeal phase: Secondary | ICD-10-CM | POA: Diagnosis not present

## 2022-11-14 DIAGNOSIS — E785 Hyperlipidemia, unspecified: Secondary | ICD-10-CM | POA: Diagnosis present

## 2022-11-14 DIAGNOSIS — D539 Nutritional anemia, unspecified: Secondary | ICD-10-CM | POA: Diagnosis not present

## 2022-11-14 DIAGNOSIS — I69391 Dysphagia following cerebral infarction: Secondary | ICD-10-CM | POA: Diagnosis not present

## 2022-11-14 DIAGNOSIS — K5900 Constipation, unspecified: Secondary | ICD-10-CM | POA: Diagnosis not present

## 2022-11-14 DIAGNOSIS — Z85828 Personal history of other malignant neoplasm of skin: Secondary | ICD-10-CM

## 2022-11-14 DIAGNOSIS — I82452 Acute embolism and thrombosis of left peroneal vein: Secondary | ICD-10-CM | POA: Diagnosis not present

## 2022-11-14 DIAGNOSIS — Z91018 Allergy to other foods: Secondary | ICD-10-CM | POA: Diagnosis not present

## 2022-11-14 DIAGNOSIS — I63033 Cerebral infarction due to thrombosis of bilateral carotid arteries: Secondary | ICD-10-CM

## 2022-11-14 DIAGNOSIS — R109 Unspecified abdominal pain: Secondary | ICD-10-CM | POA: Diagnosis not present

## 2022-11-14 DIAGNOSIS — K9189 Other postprocedural complications and disorders of digestive system: Secondary | ICD-10-CM | POA: Diagnosis not present

## 2022-11-14 DIAGNOSIS — I69320 Aphasia following cerebral infarction: Secondary | ICD-10-CM | POA: Diagnosis not present

## 2022-11-14 DIAGNOSIS — M6281 Muscle weakness (generalized): Secondary | ICD-10-CM | POA: Diagnosis not present

## 2022-11-14 DIAGNOSIS — I69359 Hemiplegia and hemiparesis following cerebral infarction affecting unspecified side: Secondary | ICD-10-CM | POA: Diagnosis not present

## 2022-11-14 DIAGNOSIS — R21 Rash and other nonspecific skin eruption: Secondary | ICD-10-CM | POA: Diagnosis not present

## 2022-11-14 DIAGNOSIS — R7303 Prediabetes: Secondary | ICD-10-CM | POA: Diagnosis present

## 2022-11-14 DIAGNOSIS — M7989 Other specified soft tissue disorders: Secondary | ICD-10-CM | POA: Diagnosis not present

## 2022-11-14 HISTORY — PX: LOOP RECORDER INSERTION: EP1214

## 2022-11-14 SURGERY — LOOP RECORDER INSERTION
Anesthesia: LOCAL

## 2022-11-14 MED ORDER — ADULT MULTIVITAMIN W/MINERALS CH
1.0000 | ORAL_TABLET | Freq: Every day | ORAL | Status: DC
  Administered 2022-11-15 – 2022-12-09 (×25): 1 via ORAL
  Filled 2022-11-14 (×26): qty 1

## 2022-11-14 MED ORDER — THIAMINE MONONITRATE 100 MG PO TABS
100.0000 mg | ORAL_TABLET | Freq: Every day | ORAL | Status: DC
  Administered 2022-11-15 – 2022-12-09 (×25): 100 mg via ORAL
  Filled 2022-11-14 (×25): qty 1

## 2022-11-14 MED ORDER — ASPIRIN 81 MG PO TBEC
81.0000 mg | DELAYED_RELEASE_TABLET | Freq: Every day | ORAL | Status: DC
  Administered 2022-11-15: 81 mg via ORAL
  Filled 2022-11-14: qty 1

## 2022-11-14 MED ORDER — SIMETHICONE 80 MG PO CHEW
80.0000 mg | CHEWABLE_TABLET | Freq: Four times a day (QID) | ORAL | Status: DC
  Administered 2022-11-14 – 2022-11-30 (×63): 80 mg via ORAL
  Filled 2022-11-14 (×63): qty 1

## 2022-11-14 MED ORDER — LIDOCAINE-EPINEPHRINE 1 %-1:100000 IJ SOLN
INTRAMUSCULAR | Status: AC
  Administered 2022-11-14: 5 mL
  Filled 2022-11-14: qty 1

## 2022-11-14 MED ORDER — ASPIRIN 81 MG PO TBEC: 81.0000 mg | DELAYED_RELEASE_TABLET | Freq: Every day | ORAL | 0 refills | Status: DC

## 2022-11-14 MED ORDER — VITAMIN B-1 100 MG PO TABS: 100.0000 mg | ORAL_TABLET | Freq: Every day | ORAL | Status: DC

## 2022-11-14 MED ORDER — DIPHENHYDRAMINE HCL 25 MG PO CAPS
25.0000 mg | ORAL_CAPSULE | Freq: Four times a day (QID) | ORAL | Status: DC | PRN
  Administered 2022-11-24 – 2022-11-26 (×6): 25 mg via ORAL
  Filled 2022-11-14 (×6): qty 1

## 2022-11-14 MED ORDER — ATORVASTATIN CALCIUM 40 MG PO TABS: 40.0000 mg | ORAL_TABLET | Freq: Every day | ORAL | 0 refills | Status: AC

## 2022-11-14 MED ORDER — VITAMIN B-12 1000 MCG PO TABS
1000.0000 ug | ORAL_TABLET | Freq: Every day | ORAL | Status: DC
  Administered 2022-11-15 – 2022-11-27 (×13): 1000 ug via ORAL
  Filled 2022-11-14 (×13): qty 1

## 2022-11-14 MED ORDER — SENNOSIDES-DOCUSATE SODIUM 8.6-50 MG PO TABS
1.0000 | ORAL_TABLET | Freq: Every day | ORAL | Status: DC
  Administered 2022-11-15 – 2022-12-05 (×21): 1 via ORAL
  Filled 2022-11-14 (×21): qty 1

## 2022-11-14 MED ORDER — CYANOCOBALAMIN 1000 MCG PO TABS: 1000.0000 ug | ORAL_TABLET | Freq: Every day | ORAL | Status: DC

## 2022-11-14 MED ORDER — THIAMINE HCL 100 MG/ML IJ SOLN
100.0000 mg | Freq: Every day | INTRAMUSCULAR | Status: DC
  Filled 2022-11-14 (×7): qty 1

## 2022-11-14 MED ORDER — CLOPIDOGREL BISULFATE 75 MG PO TABS: 75.0000 mg | ORAL_TABLET | Freq: Every day | ORAL | 3 refills | Status: AC

## 2022-11-14 MED ORDER — FLEET ENEMA 7-19 GM/118ML RE ENEM: 1.0000 | ENEMA | Freq: Once | RECTAL | Status: DC | PRN

## 2022-11-14 MED ORDER — ONDANSETRON HCL 4 MG/2ML IJ SOLN: 4.0000 mg | Freq: Four times a day (QID) | INTRAMUSCULAR | Status: DC | PRN

## 2022-11-14 MED ORDER — ATORVASTATIN CALCIUM 40 MG PO TABS
40.0000 mg | ORAL_TABLET | Freq: Every day | ORAL | Status: DC
  Administered 2022-11-15 – 2022-12-09 (×24): 40 mg via ORAL
  Filled 2022-11-14 (×25): qty 1

## 2022-11-14 MED ORDER — ONDANSETRON HCL 4 MG PO TABS: 4.0000 mg | ORAL_TABLET | Freq: Four times a day (QID) | ORAL | Status: DC | PRN

## 2022-11-14 MED ORDER — POLYETHYLENE GLYCOL 3350 17 G PO PACK: 17.0000 g | PACK | Freq: Every day | ORAL | Status: DC | PRN

## 2022-11-14 MED ORDER — AMLODIPINE BESYLATE 5 MG PO TABS
5.0000 mg | ORAL_TABLET | Freq: Every day | ORAL | Status: DC
  Administered 2022-11-14: 5 mg via ORAL
  Filled 2022-11-14: qty 1

## 2022-11-14 MED ORDER — CLOPIDOGREL BISULFATE 75 MG PO TABS
75.0000 mg | ORAL_TABLET | Freq: Every day | ORAL | Status: DC
  Administered 2022-11-14: 75 mg via ORAL
  Filled 2022-11-14: qty 1

## 2022-11-14 MED ORDER — ACETAMINOPHEN 325 MG PO TABS: 325.0000 mg | ORAL_TABLET | ORAL | Status: DC | PRN

## 2022-11-14 MED ORDER — ENOXAPARIN SODIUM 40 MG/0.4ML IJ SOSY
40.0000 mg | PREFILLED_SYRINGE | INTRAMUSCULAR | Status: DC
  Administered 2022-11-14: 40 mg via SUBCUTANEOUS
  Filled 2022-11-14 (×2): qty 0.4

## 2022-11-14 MED ORDER — METHOCARBAMOL 500 MG PO TABS: 500.0000 mg | ORAL_TABLET | Freq: Four times a day (QID) | ORAL | Status: DC | PRN

## 2022-11-14 MED ORDER — AMLODIPINE BESYLATE 5 MG PO TABS
5.0000 mg | ORAL_TABLET | Freq: Every day | ORAL | Status: DC
  Administered 2022-11-15 – 2022-11-26 (×12): 5 mg via ORAL
  Filled 2022-11-14 (×12): qty 1

## 2022-11-14 MED ORDER — ASPIRIN 81 MG PO TBEC
81.0000 mg | DELAYED_RELEASE_TABLET | Freq: Every day | ORAL | Status: DC
  Administered 2022-11-14: 81 mg via ORAL
  Filled 2022-11-14: qty 1

## 2022-11-14 MED ORDER — CLOPIDOGREL BISULFATE 75 MG PO TABS
75.0000 mg | ORAL_TABLET | Freq: Every day | ORAL | Status: DC
  Administered 2022-11-15: 75 mg via ORAL
  Filled 2022-11-14: qty 1

## 2022-11-14 MED ORDER — SIMETHICONE 80 MG PO CHEW: 80.0000 mg | CHEWABLE_TABLET | Freq: Four times a day (QID) | ORAL | 0 refills | Status: DC

## 2022-11-14 MED ORDER — BISACODYL 5 MG PO TBEC
5.0000 mg | DELAYED_RELEASE_TABLET | Freq: Every day | ORAL | Status: DC | PRN
  Filled 2022-11-14: qty 1

## 2022-11-14 MED ORDER — GUAIFENESIN-DM 100-10 MG/5ML PO SYRP: 10.0000 mL | ORAL_SOLUTION | Freq: Four times a day (QID) | ORAL | Status: DC | PRN

## 2022-11-14 MED ORDER — ALUM & MAG HYDROXIDE-SIMETH 200-200-20 MG/5ML PO SUSP: 30.0000 mL | ORAL | Status: DC | PRN

## 2022-11-14 MED ORDER — PANTOPRAZOLE SODIUM 40 MG PO TBEC
40.0000 mg | DELAYED_RELEASE_TABLET | Freq: Every day | ORAL | Status: DC
  Administered 2022-11-15 – 2022-12-09 (×25): 40 mg via ORAL
  Filled 2022-11-14 (×25): qty 1

## 2022-11-14 MED ORDER — TRAZODONE HCL 50 MG PO TABS: 50.0000 mg | ORAL_TABLET | Freq: Every evening | ORAL | Status: DC | PRN

## 2022-11-14 MED ORDER — POLYVINYL ALCOHOL 1.4 % OP SOLN: 1.0000 [drp] | OPHTHALMIC | Status: DC | PRN

## 2022-11-14 SURGICAL SUPPLY — 2 items
PACK LOOP INSERTION (CUSTOM PROCEDURE TRAY) ×1 IMPLANT
SYSTEM MONITOR REVEAL LINQ II (Prosthesis & Implant Heart) IMPLANT

## 2022-11-14 NOTE — Progress Notes (Signed)
Inpatient Rehab Admissions Coordinator:   Received insurance approval for CIR admission. Spoke with MD, patient and wife and all are in agreement for patient to move to CIR today.   Rehab Admissons Coordinator [place], [place], [place] [phone]

## 2022-11-14 NOTE — Progress Notes (Signed)
Ordered left PRAFO and resting wrist splint and LLE venous duplex.

## 2022-11-14 NOTE — H&P (Signed)
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  He awoke that morning and attempted to walk to the bathroom but fell because of left-sided weakness.  His wife noted an episode of bowel incontinence.  Allergy consulted and was outside of the window for tenecteplase.  He was admitted to the hospitalist service. Was reassessed later in the afternoon and noted to have twitching/jerking movements in bilateral lower extremities left worse than right.  CT of the head without acute findings.  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  No hemorrhage.  MRI also performed of the complete spine but limited due to motion artifact.  Pete CT head was performed as well as EEG.  CTA of the head with intracranial atherosclerotic disease with multifocal stenoses, most notably of a moderate degree of the left paraclinoid ICA.  2D echo with EF of 60 to 65% and left atrial dilation.  Hemoglobin A1c 5.7%.  He was started on aspirin and Plavix for 3 weeks followed by Plavix alone.  Tolerating diet. BP controlled. 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  Review of Systems  Unable to perform ROS: Mental acuity   Past Medical History:  Diagnosis Date   Allergic rhinitis    Anxiety    Detached retina    Heart murmur    has had one when a child and checked out ok  never? 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  elevated MCV  No other sx    Past Surgical History:  Procedure Laterality Date    CATARACT EXTRACTION  2009   [facility]   CHOLECYSTECTOMY N/A 02/25/2020   Procedure: LAPAROSCOPIC CHOLECYSTECTOMY;  Surgeon: Harriette Bouillon, MD;  Location: [facility] OR;  Service: General;  Laterality: N/A;   Family History  Problem Relation Age of Onset   Arthritis Mother    Hypertension Mother    Arthritis Father    Hypertension Father    Social History:  reports that he has never smoked. He has never used smokeless tobacco. He reports current alcohol use of about 14.0 - 21.0 standard drinks of alcohol per week. He reports that he does not use drugs. Allergies:  Allergies  Allergen Reactions   Cinnamon Other (See Comments)    intolerance   Other Other (See Comments)    All kind of nuts except peanuts - intolerance   Pollen Extract-Tree Extract [Pollen Extract]    Medications Prior to Admission  Medication Sig Dispense Refill   aspirin EC 81 MG tablet Take 81 mg by mouth daily.     calcium carbonate (TUMS - DOSED IN MG ELEMENTAL CALCIUM) 500 MG chewable tablet Chew 1,000 mg by mouth daily as needed for indigestion or heartburn.     chlorpheniramine (CHLOR-TRIMETON) 4 MG tablet Take 4 mg by mouth 2 (two) times daily as needed for allergies.     diphenhydrAMINE (BENADRYL) 25 MG tablet Take 25 mg by mouth every 6 (six) hours as needed for itching or allergies.  lisinopril (ZESTRIL) 20 MG tablet TAKE 1 TABLET BY MOUTH EVERY DAY (Patient taking differently: Take 20 mg by mouth daily.) 90 tablet 1   metoprolol tartrate (LOPRESSOR) 25 MG tablet TAKE 1 TABLET BY MOUTH TWICE A DAY (Patient taking differently: Take 25 mg by mouth 2 (two) times daily.) 180 tablet 2   Multiple Vitamin (MULTIVITAMIN WITH MINERALS) TABS tablet Take 1 tablet by mouth daily.     niacin 500 MG tablet Take 500 mg by mouth daily.     Polyethyl Glycol-Propyl Glycol (SYSTANE) 0.4-0.3 % GEL ophthalmic gel Place 1 Application into both eyes daily as needed.        Home: Home Living Family/patient expects to be  discharged to:: Private residence Living Arrangements: Spouse/significant other Available Help at Discharge: Family, Available 24 hours/day Type of Home: House Home Access: Stairs to enter Secretary/administrator of Steps: 1 Entrance Stairs-Rails: None Home Layout: One level Bathroom Shower/Tub: Health visitor: Standard Home Equipment: Information systems manager - built in, [employer] - tub/shower  Lives With: Spouse   Functional History: Prior Function Prior Level of Function : Independent/Modified Independent, Driving Mobility Comments: Shuffling gait pattern at baseline, intermittently places a hand on furniture/walls but not often; no other falls other than this one  Functional Status:  Mobility: Bed Mobility Overal bed mobility: Needs Assistance Bed Mobility: Rolling, Supine to Sit Rolling: Mod assist, Max assist Supine to sit: Total assist, HOB elevated Sit to supine: Total assist, HOB elevated General bed mobility comments: pt able to flex Rt LE and punch Rt UE upwards and contralaterally towards bed rail for Lt rolling. Max asisst to complete to Lt and Max +2 to roll Rt. pt had BM and total assist for pericare. pt able to bring Rt LE off EOB with verbal cues. Max assist to bring Lt LE off and press trunk upright to sit EOB. Transfers Overall transfer level: Needs assistance Equipment used: 2 person hand held assist Transfers: Sit to/from Stand, Bed to chair/wheelchair/BSC Sit to Stand: Max assist, +2 safety/equipment Bed to/from chair/wheelchair/BSC transfer type:: Via Lift equipment Stand pivot transfers: +2 safety/equipment, Max assist Transfer via Lift Equipment: Stedy General transfer comment: Stedy positioned for sit<>stand to trial for bed>chair transfer. Pt able to use Rt UE and LE to power up. manual blocking to prevent Lt lean and support Lt UE. mod-max+2 for stand and to bring paddles down for seated position on Stedy. dependent transfer bed>chair. Pt required  increased assist to rise from stedy paddles and control lower to chair. Ambulation/Gait General Gait Details: unable    ADL: ADL Overall ADL's : Needs assistance/impaired General ADL Comments: total A for all basic ADLs at bed level or supported sitting in recliner  Cognition: Cognition Overall Cognitive Status: Impaired/Different from baseline Arousal/Alertness: Awake/alert Orientation Level: Oriented to person Year: 2024 Month: July Day of Week: Correct Attention: Focused, Sustained Focused Attention: Impaired Focused Attention Impairment: Verbal basic, Verbal complex, Functional basic Sustained Attention: Impaired Memory: Impaired Memory Impairment: Decreased short term memory, Decreased recall of new information Awareness: Impaired Problem Solving: Impaired Problem Solving Impairment: Verbal complex, Functional basic, Functional complex Executive Function: Organizing, Self Monitoring Organizing: Impaired Self Monitoring: Impaired Safety/Judgment: Impaired Cognition Arousal/Alertness: Lethargic Behavior During Therapy: Flat affect Overall Cognitive Status: Impaired/Different from baseline Area of Impairment: Attention, Memory, Following commands, Safety/judgement, Awareness, Problem solving Current Attention Level: Focused Memory: Decreased short-term memory Following Commands: Follows one step commands with increased time, Follows one step commands consistently Safety/Judgement: Decreased awareness of safety, Decreased awareness  of deficits Awareness: Intellectual Problem Solving: Slow processing, Decreased initiation, Difficulty sequencing, Requires verbal cues, Requires tactile cues General Comments: pt with improved alertness and engaging in therapy session. pt answering questions appropriately with 1-3 word responses and extra time to process. pt showing thumbs up or down to relay if feeling comfortable/good. pt able to keep Rt eye ~50% open throughout session, Lt  eye remains mostly closed. pt showing no signs of discomfort throughout session. wife present at bedside and pt able to recognize and name her. Lt neglect/inattention.  Physical Exam: Blood pressure (!) 166/54, pulse 71, temperature 98.9 F (37.2 C), temperature source Oral, resp. rate 18, height 5\' 6"  (1.676 m), weight 72.6 kg, SpO2 96 %. Physical Exam Constitutional:      Appearance: He is ill-appearing.  HENT:     Head: Normocephalic and atraumatic.     Right Ear: External ear normal.     Left Ear: External ear normal.     Nose: Nose normal.  Eyes:     Pupils: Pupils are equal, round, and reactive to light.  Cardiovascular:     Rate and Rhythm: Normal rate and regular rhythm.     Heart sounds: No murmur heard.    No gallop.  Pulmonary:     Effort: Pulmonary effort is normal. No respiratory distress.  Abdominal:     General: Bowel sounds are normal. There is no distension.     Palpations: Abdomen is soft.  Musculoskeletal:        General: No deformity.     Cervical back: Normal range of motion.     Right lower leg: No edema.     Left lower leg: No edema.  Skin:    General: Skin is warm and dry.  Neurological:     Mental Status: He is alert.     Comments: Pt is alert but very flat. Very slow to process and speak. Spoke a few sentences in response to questions throughout the meeting, occasionally would give an affirmative head nod. Right gaze preference. Moves right side fairly spontaneously but did not consistently cooperate with MMT testing. LUE 0/5, LLE 0/5 with sl early extensor tone in LLE. LUE fairly loose. DTR's brisk at 2+ LUE and LLE and 1+ on right. Decreased LT and pain on left lower more so than upper limb. Was able to discern pain from LT in the LUE to a certain extent.   Psychiatric:     Comments: Flat and disengaged     Results for orders placed or performed during the hospital encounter of 11/08/22 (from the past 48 hour(s))  Renal function panel     Status:  Abnormal   Collection Time: 11/13/22  5:25 AM  Result Value Ref Range   Sodium 133 (L) 135 - 145 mmol/L   Potassium 3.5 3.5 - 5.1 mmol/L   Chloride 105 98 - 111 mmol/L   CO2 20 (L) 22 - 32 mmol/L   Glucose, Bld 115 (H) 70 - 99 mg/dL    Comment: Glucose reference range applies only to samples taken after fasting for at least 8 hours.   BUN 12 8 - 23 mg/dL   Creatinine, Ser 1.61 0.61 - 1.24 mg/dL   Calcium 8.1 (L) 8.9 - 10.3 mg/dL   Phosphorus 3.4 2.5 - 4.6 mg/dL   Albumin 3.0 (L) 3.5 - 5.0 g/dL   GFR, Estimated >09 >60 mL/min    Comment: (NOTE) Calculated using the CKD-EPI Creatinine Equation (2021)    Anion gap 8  5 - 15    Comment: Performed at [facility], [address], [place], [place] [address]  Magnesium     Status: None   Collection Time: 11/13/22  5:25 AM  Result Value Ref Range   Magnesium 1.9 1.7 - 2.4 mg/dL    Comment: Performed at [facility], [address] [place], [place] [address]  CBC     Status: Abnormal   Collection Time: 11/13/22  5:25 AM  Result Value Ref Range   WBC 10.9 (H) 4.0 - 10.5 K/uL   RBC 3.59 (L) 4.22 - 5.81 MIL/uL   Hemoglobin 11.5 (L) 13.0 - 17.0 g/dL   HCT 11.9 (L) 14.7 - 82.9 %   MCV 93.6 80.0 - 100.0 fL   MCH 32.0 26.0 - 34.0 pg   MCHC 34.2 30.0 - 36.0 g/dL   RDW 56.2 13.0 - 86.5 %   Platelets 176 150 - 400 K/uL   nRBC 0.0 0.0 - 0.2 %    Comment: Performed at [facility], [address], [place], [place] [address]   DG Abd Portable 1V  Result Date: 11/13/2022 CLINICAL DATA:  Abdominal distension EXAM: PORTABLE ABDOMEN - 1 VIEW COMPARISON:  Abdominal CT 02/25/2020 FINDINGS: Diffuse gaseous distension of bowel suggesting adynamic ileus, reaching the rectum. No abnormal stool retention. No concerning mass effect or calcification. Cholecystectomy clips. Cardiomegaly. Clear lung bases. Artifact from EKG leads. IMPRESSION: Generalized gaseous distension of bowel compatible with ileus. Electronically Signed   By: Tiburcio Pea M.D.   On: 11/13/2022 10:55      Blood pressure (!) 166/54, pulse 71, temperature 98.9 F (37.2 C), temperature source Oral, resp. rate 18, height 5\' 6"  (1.676 m), weight 72.6 kg, SpO2 96 %.  Medical Problem List and Plan: 1. 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  Antithrombotics: -DVT/anticoagulation:  Pharmaceutical: Lovenox--begin 40mg  daily  -needs screening venous dopplers  -antiplatelet therapy: Aspirin and Plavix for three weeks followed by Plavix alone.  Last dose of aspirin will be 7/29  3. Pain Management: Tylenol as needed  4. Mood/Behavior/Sleep: LCSW to evaluate and provide emotional support  -antipsychotic agents: n/a  5. Neuropsych/cognition: This patient is not capable of making decisions on his own behalf.  -pt has hx of ETOH abuse, 2-3 beers + wine most nights 6. Skin/Wound Care: Routine skin care checks   7. Fluids/Electrolytes/Nutrition: Routine Is and Os and follow-up chemistries in AM  -continue MVI, thiamine  8: Hypertension: monitor TID and prn (home meds: lisinopril, Lopressor)  -continue amlodipine 5 mg daily   9: Hyperlipidemia: continue statin  10: Prediabetes: ? diet controlled; A1c = 5.7%  11: Macrocytic anemia/B12 deficiency: follow-up CBC  -hx of pernicious anemia; PCP rec: daily B12 12. Ileus:  -moving bowels now. Had bm this morning  -senna-s at bedtime  -PPI, prn simethicone 13. 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 Thyroid nodule-TFT's essentially WNL  -f/u as outpt, thyroid U/S, etc.           Milinda Antis, PA-C 11/14/2022

## 2022-11-14 NOTE — Discharge Instructions (Signed)
Care After Your Loop Recorder  You have a Abbott Loop Recorder   Monitor your cardiac device site for redness, swelling, and drainage. Call the device clinic at [phone] if you experience these symptoms or fever/chills.  If you notice bleeding from your site, hold firm, but gently pressure with two fingers for 5 minutes. Dried blood on the steri-strips when removing the outer bandage is normal.   Keep the large square bandage on your site for 24 hours and then you may remove it yourself. Keep the steri-strips underneath in place.   You may shower after 72 hours / 3 days from your procedure with the steri-strips in place. They will usually fall off on their own, or may be removed after 10 days. Pat dry.   Avoid lotions, ointments, or perfumes over your incision until it is well-healed.  Please do not submerge in water until your site is completely healed.   Your device is MRI compatible.   Remote monitoring is used to monitor your cardiac device from home. This monitoring is scheduled every month by our office. It allows us to keep an eye on the function of your device to ensure it is working properly.    

## 2022-11-14 NOTE — Progress Notes (Signed)
Patient back to the unit.

## 2022-11-14 NOTE — Progress Notes (Signed)
Orthopedic Tech Progress Note Patient Details:  KAVI UNZICKER 1945-10-06 [id number]  Called in order to [employer] for a RESTING WHO and PRAFO   Patient ID: ICHAEL MCCARTER, male   DOB: [birth year], 77 y.o.   MRN: [id number]  Donald Pore 11/14/2022, 5:29 PM

## 2022-11-14 NOTE — Progress Notes (Signed)
Patient discharged to CIR ( 03)

## 2022-11-14 NOTE — Progress Notes (Signed)
Patient arrived on unit in bed accompanied by discharging RN/NT and wife. Patient and wife oriented to unit, patient denies pain. Vitals WDL

## 2022-11-14 NOTE — Discharge Summary (Signed)
Physician Discharge Summary  Samuel Pearson ZOX:[id number] DOB: Jan 01, 1946 DOA: 11/08/2022  PCP: Madelin Headings, MD  Admit date: 11/08/2022 Discharge date: 11/14/2022 Admitted From: Home Disposition: CIR Recommendations for Outpatient Follow-up:  Follow up with neurology in 4 to 6 weeks Outpatient follow-up with neurosurgery after 3 to 4 weeks once he is off Plavix Check CMP and CBC in 1 week Thyroid ultrasound and repeat thyroid panel in 4 to 6 weeks Please follow up on the following pending results: None   Discharge Condition: Stable CODE STATUS: Full code  Follow-up Information     Lisbeth Renshaw, MD. Schedule an appointment as soon as possible for a visit in 3 week(s).   Specialty: Neurosurgery Contact information: [address] [place] [address] [phone]         [facility]. Schedule an appointment as soon as possible for a visit in 4 week(s).   Specialty: Neurology Contact information: [address] [place] [address] [phone]                Hospital course 77 year old M with PMH of HTN, anxiety, anemia, heart murmur and EtOH abuse presenting with left-sided weakness and found to have acute CVA and right ACA territory as noted on MRI brain.  MRI cervical spine showed severe C3-4 and C4-5 spinal canal stenosis with possible cord signal abnormality concerning for myelomalacia.  Neurology consulted.  Patient admitted for further CVA workup.    CT angio head negative for LVO but moderate to severe multivessel stenosis bilaterally.  TTE without significant finding.  LDL 130.  A1c 5.7%.  Neurology recommended Plavix and aspirin for 3 weeks followed by Plavix alone.   Neurosurgery, Dr. Conchita Paris consulted about cervical spinal stenosis and felt finding to be chronic and recommended outpatient follow-up once he is off Plavix.  Did not feel like there is utility for steroid  either.   Encephalopathy resolved.  Aphasia improved.  Left hemiparesis persisted.  Hospital course complicated by ileus that has improved.  Discharged to CIR per recommendation by therapy.  See individual problem list below for more.   Problems addressed during this hospitalization Principal Problem:   CVA (cerebral vascular accident) [facility]) Active Problems:   Essential hypertension   Macrocytic anemia   Hypocalcemia   Thrombocytopenia (HCC)   Alcohol use   Spinal stenosis   Acute CVA with left hemiparesis: Presents with left-sided weakness.  MRI brain showed acute CVA and right ACA territory.  Outside tPA window on arrival.  CT angio head negative for LVO but moderate to severe multivessel stenosis bilaterally.  TTE without significant finding.  LDL 130.  A1c 5.7%.  Patient has been on daily aspirin. -Neurology recs: -Plavix and aspirin for 3 weeks followed by Plavix alone.  -Prolonged cardiac monitoring at discharge.  Loop recorder placed by cardiology. -Aggressive risk reduction -Continue Lipitor. -Therapy recommended CIR.  -Peer to peer successful on 7/5.   Acute metabolic encephalopathy: Multifactorial including Ativan, CVA and possible delirium.  EEG and negative for seizure.  TSH slightly elevated.  B12 is 204.  Encephalopathy resolved. -Continue thiamine, multivitamin and folic acid -Received vitamin B12 injection on 7/3.  Continue p.o. vitamin B12. -Reorientation and delirium precautions.   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  He has left-sided weakness with left foot drop. Neurosurgery, Dr. Conchita Paris consulted about cervical spinal stenosis and felt finding to be chronic  and recommended outpatient follow-up once he is off Plavix.  Did not feel like there is utility for steroid either. -Outpatient follow-up with neurosurgery, Dr. Conchita Paris after 3-4 weeks   Abdominal  distention/ileus: Improved.  Abdomen less distended.  Having bowel movements and flatus.  No nausea or vomiting.  -Continue simethicone -Mobilize as able -Anticipate improvement with aggressive therapy at CIR   Alcohol use: Reportedly drinks 2-3 beers plus glass of wine most nights.  Became very somnolent after receiving Ativan.  No withdrawal symptoms. -Continue multivitamin, thiamine and folic acid   Essential hypertension: BP slightly elevated. -Resumed home lisinopril on discharge. -Discontinued home metoprolol   Macrocytic anemia/Vitamin B12 deficiency -Continue vitamin B12   Hypocalcemia/hypokalemia: Resolved.   Thrombocytopenia: Resolved.   Hyponatremia: Mild   Hyperlipidemia: LDL 130 -Statin as above   Thyroid nodule: TSH slightly elevated but free T4 within normal. -Need outpatient PFT in 4 to 6 weeks -Thyroid US outpatient             Time spent 45 minutes  Vital signs Vitals:   11/13/22 2329 11/14/22 0414 11/14/22 0741 11/14/22 1101  BP: (!) 186/49 (!) 165/50 (!) 158/47 (!) 166/54  Pulse: 65 62 63 71  Temp: 98.6 F (37 C) 97.8 F (36.6 C) 98.1 F (36.7 C) 98.9 F (37.2 C)  Resp: 20 18 18 18   Height:      Weight:      SpO2: 96% 96% 96% 96%  TempSrc: Oral Oral Oral Oral  BMI (Calculated):         Discharge exam  GENERAL: No apparent distress.  Nontoxic. HEENT: MMM.  Vision and hearing grossly intact.  NECK: Supple.  No apparent JVD.  RESP:  No IWOB.  Fair aeration bilaterally. CVS:  RRR. Heart sounds normal.  ABD/GI/GU: BS+. Abd slightly distended.  Nontender. MSK/EXT: Left hemiparesis. SKIN: no apparent skin lesion or wound NEURO: Awake and alert. Oriented appropriately.  Some aphasia.  Subtle left facial droop.  Left hemiparesis. PSYCH: Calm. Normal affect.   Discharge Instructions  Allergies as of 11/14/2022       Reactions   Cinnamon Other (See Comments)   intolerance   Other Other (See Comments)   All kind of nuts except peanuts -  intolerance   Pollen Extract-tree Extract [pollen Extract]         Medication List     STOP taking these medications    chlorpheniramine 4 MG tablet Commonly known as: CHLOR-TRIMETON   diphenhydrAMINE 25 MG tablet Commonly known as: BENADRYL   metoprolol tartrate 25 MG tablet Commonly known as: LOPRESSOR   niacin 500 MG tablet Commonly known as: VITAMIN B3       TAKE these medications    aspirin EC 81 MG tablet Take 1 tablet (81 mg total) by mouth daily. Swallow whole. What changed: additional instructions   atorvastatin 40 MG tablet Commonly known as: LIPITOR Take 1 tablet (40 mg total) by mouth daily. Start taking on: November 15, 2022   calcium carbonate 500 MG chewable tablet Commonly known as: TUMS - dosed in mg elemental calcium Chew 1,000 mg by mouth daily as needed for indigestion or heartburn.   clopidogrel 75 MG tablet Commonly known as: PLAVIX Take 1 tablet (75 mg total) by mouth daily.   cyanocobalamin 1000 MCG tablet Take 1 tablet (1,000 mcg total) by mouth daily. Start taking on: November 15, 2022   lisinopril 20 MG tablet Commonly known as: ZESTRIL TAKE 1 TABLET BY MOUTH EVERY DAY  multivitamin with minerals Tabs tablet Take 1 tablet by mouth daily.   simethicone 80 MG chewable tablet Commonly known as: MYLICON Chew 1 tablet (80 mg total) by mouth 4 (four) times daily.   Systane 0.4-0.3 % Gel ophthalmic gel Generic drug: Polyethyl Glycol-Propyl Glycol Place 1 Application into both eyes daily as needed.   thiamine 100 MG tablet Commonly known as: Vitamin B-1 Take 1 tablet (100 mg total) by mouth daily. Start taking on: November 15, 2022        Consultations: Neurology Neurosurgery  Procedures/Studies:   EP PPM/ICD IMPLANT  Result Date: 11/14/2022 SURGEON:  Maurice Small, MD   PREPROCEDURE DIAGNOSIS:  Cryptogenic stroke   POSTPROCEDURE DIAGNOSIS:  Cryptogenic stroke    PROCEDURES:  1. Implantable loop recorder implantation    INTRODUCTION:  YOON CLEM  is a 77 y.o. patient with a history of cryptogenic stroke. Inpatient telemetry has been reviewed and not shown atrial fibrillation. The patient therefore presents today for implantable loop implantation. The costs of loop recorder monitoring have been discussed with the patient.   DESCRIPTION OF PROCEDURE:  Informed written consent was obtained.  A timeout was performed. The patient required no sedation for the procedure today.  The patients left chest was prepped and draped in the usual sterile fashion. The skin overlying the left parasternal region was infiltrated with lidocaine for local analgesia.  A 0.5-cm incision was made over the left parasternal region over the 3rd intercostal space.  A medtronic implantable loop recorder (SN [id number]) was then placed into the pocket  R waves were very prominent and measured >0.36mV.  Steri- Strips and a sterile dressing were then applied.  There were no early apparent complications.   CONCLUSIONS:  1. Successful implantation of a Medtronic Linq 2 implantable loop recorder for a history of cryptogenic stroke  2. No early apparent complications. Maurice Small, MD Cardiac Electrophysiology   DG Abd Portable 1V  Result Date: 11/13/2022 CLINICAL DATA:  Abdominal distension EXAM: PORTABLE ABDOMEN - 1 VIEW COMPARISON:  Abdominal CT 02/25/2020 FINDINGS: Diffuse gaseous distension of bowel suggesting adynamic ileus, reaching the rectum. No abnormal stool retention. No concerning mass effect or calcification. Cholecystectomy clips. Cardiomegaly. Clear lung bases. Artifact from EKG leads. IMPRESSION: Generalized gaseous distension of bowel compatible with ileus. Electronically Signed   By: Tiburcio Pea M.D.   On: 11/13/2022 10:55   CT ANGIO HEAD NECK W WO CM  Result Date: 11/09/2022 CLINICAL DATA:  Provided history: Neuro deficit, acute, stroke suspected. EXAM: CT ANGIOGRAPHY HEAD AND NECK WITH AND WITHOUT CONTRAST TECHNIQUE:  Multidetector CT imaging of the head and neck was performed using the standard protocol during bolus administration of intravenous contrast. Multiplanar CT image reconstructions and MIPs were obtained to evaluate the vascular anatomy. Carotid stenosis measurements (when applicable) are obtained utilizing NASCET criteria, using the distal internal carotid diameter as the denominator. RADIATION DOSE REDUCTION: This exam was performed according to the departmental dose-optimization program which includes automated exposure control, adjustment of the mA and/or kV according to patient size and/or use of iterative reconstruction technique. CONTRAST:  75mL OMNIPAQUE IOHEXOL 350 MG/ML SOLN COMPARISON:  Head CT 11/08/2022.  Brain MRI 11/08/2022. FINDINGS: CT HEAD FINDINGS Brain: Generalized cerebral atrophy. Known small acute infarcts within the right frontal lobe were better appreciated on the prior brain MRI of 11/08/2022. Background moderate patchy and ill-defined hypoattenuation within the cerebral white matter, nonspecific but compatible with chronic small ischemic disease. Redemonstrated prominent perivascular space or chronic lacunar infarct within  the left basal ganglia. There is no acute intracranial hemorrhage. No extra-axial fluid collection. No evidence of an intracranial mass. No midline shift. Vascular: No hyperdense vessel.  Atherosclerotic calcifications. Skull: No calvarial fracture or aggressive osseous lesion. Sinuses/Orbits: No orbital mass or acute orbital finding. Minimal mucosal thickening, and small mucous retention cyst, within the left maxillary sinus. Small mucous retention cyst within the left sphenoid sinus. Review of the MIP images confirms the above findings CTA NECK FINDINGS Aortic arch: Standard aortic branching. Atherosclerotic plaque within the visualized aortic arch and proximal major branch vessels of the neck. No hemodynamically significant innominate or proximal subclavian artery  stenosis. Right carotid system: CCA and ICA patent within the neck. Atherosclerotic plaque about the carotid bifurcation and within the proximal ICA. Less than 50% stenosis of the proximal ICA. Tortuosity of the cervical ICA. Left carotid system: CCA and ICA patent within the neck without stenosis. Minimal atherosclerotic plaque of the carotid bifurcation. Vertebral arteries: Vertebral arteries patent within the neck. The right vertebral artery is dominant. Mild non-stenotic atherosclerotic plaque within the right vertebral artery at the V3/V4 junction Skeleton: Reversal of the expected cervical lordosis. Slight C2-C3 grade 1 anterolisthesis. 4 mm C3-C4 grade 1 retrolisthesis. 2 mm C7-T1 grade 1 anterolisthesis. Advanced cervical spondylosis. Probable vertebral body ankylosis at C5-C6. Other neck: 2.4 cm right thyroid lobe nodule. Upper chest: No consolidation within the imaged lung apices. Review of the MIP images confirms the above findings CTA HEAD FINDINGS Anterior circulation: The intracranial internal carotid arteries are patent. Atherosclerotic plaque within both vessels. Mild stenosis of the paraclinoid right ICA. Up to moderate stenosis of the paraclinoid left ICA. The M1 middle cerebral arteries are patent. Atherosclerotic irregularity of the M2 and more distal MCA vessels bilaterally. Most notably, there are multiple sites of severe stenosis within a mid M2 left MCA vessel (series 16, image 35). No M2 proximal branch occlusion is identified. The anterior cerebral arteries are patent. Atherosclerotic irregularity of both vessels without high-grade proximal stenosis. The left A1 segment mentally absent. No intracranial aneurysm is identified. Posterior circulation: The intracranial vertebral arteries are patent. Mild nonstenotic atherosclerotic plaque within the right vertebral artery at the V3/V4 junction. The basilar artery is patent. The posterior cerebral arteries are patent. Atherosclerotic  irregularity of both vessels. Most notably, there is a moderate/severe stenosis within the right PCA P2 segment, a moderate stenosis within the left PCA P2 segment and sites of severe stenoses within a left PCA branch at the P4 segment level. Posterior communicating arteries are diminutive or absent bilaterally. Venous sinuses: The dural venous sinuses are poorly assessed due to contrast timing. Anatomic variants: As described. Review of the MIP images confirms the above findings IMPRESSION: CT head: 1. Known small acute infarcts within the right frontal lobe were better appreciated on the prior brain MRI of 11/08/2022. 2. Background parenchymal atrophy and chronic small vessel disease as described. 3. Mild paranasal sinus disease. CTA neck: 1. The common carotid and internal carotid arteries are patent within the neck. Atherosclerotic plaque about both carotid bifurcations and within the proximal right ICA. Less than 50% stenosis of the proximal right ICA. No cervical ICA stenosis on the left. 2. The vertebral arteries are patent within the neck. Mild non-stenotic atherosclerotic plaque within the dominant right vertebral artery at the V3/V4 junction. 3. 2.4 cm right thyroid lobe nodule. A non-emergent thyroid ultrasound is recommended for further evaluation. Reference: J Am Coll Radiol. 2015 Feb;12(2): 143-50. 4. Advanced cervical spondylosis. Probable vertebral body ankylosis at  C5-C6. CTA head: 1. No intracranial large vessel occlusion is identified. 2. Intracranial atherosclerotic disease with multifocal stenoses, most notably as follows. 3. Up to moderate stenosis of the paraclinoid left ICA. 4. Sites of severe stenosis within a mid M2 left MCA vessel. 5. Moderate/severe stenosis within the right PCA P2 segment. 6. Moderate stenosis within the left PCA P2 segment. 7. Severe stenoses within a left PCA branch at the P4 segment level. Electronically Signed   By: Jackey Loge D.O.   On: 11/09/2022 10:45   EEG  adult  Result Date: 11/09/2022 Charlsie Quest, MD     11/09/2022 10:05 AM Patient Name: FREDRICK GUINANE MRN: [id number] Epilepsy Attending: Charlsie Quest Referring Physician/Provider: Elmer Picker, NP Date: 11/08/2022 Duration: 25.04 mins Patient history:  77 y.o. male with a past medical history of antexity, heart murmur, HTN, anemia presenting with left leg weakness. EEG to evaluate for seizure. Level of alertness: Awake AEDs during EEG study: Ativan Technical aspects: This EEG study was done with scalp electrodes positioned according to the 10-20 International system of electrode placement. Electrical activity was reviewed with band pass filter of 1-70Hz , sensitivity of 7 uV/mm, display speed of 80mm/sec with a 60Hz  notched filter applied as appropriate. EEG data were recorded continuously and digitally stored.  Video monitoring was available and reviewed as appropriate. Description: The posterior dominant rhythm consists of 8-9 Hz activity of moderate voltage (25-35 uV) seen predominantly in posterior head regions, symmetric and reactive to eye opening and eye closing. There is an excessive amount of 15 to 18 Hz beta activity distributed symmetrically and diffusely. Hyperventilation and photic stimulation were not performed.   ABNORMALITY - Excessive beta, generalized IMPRESSION: This study is within normal limits. The excessive beta activity seen in the background is most likely due to the effect of benzodiazepine and is a benign EEG pattern. No seizures or epileptiform discharges were seen throughout the recording. A normal interictal EEG does not exclude the diagnosis of epilepsy. Priyanka Annabelle Harman   CT HEAD WO CONTRAST ( )  Result Date: 11/08/2022 CLINICAL DATA:  Neuro deficit, acute, stroke suspected EXAM: CT HEAD WITHOUT CONTRAST TECHNIQUE: Contiguous axial images were obtained from the base of the skull through the vertex without intravenous contrast. RADIATION DOSE REDUCTION: This exam was  performed according to the departmental dose-optimization program which includes automated exposure control, adjustment of the mA and/or kV according to patient size and/or use of iterative reconstruction technique. COMPARISON:  11/08/2022 MRI and CT FINDINGS: Brain: There is atrophy and chronic small vessel disease changes. Previously seen small acute infarcts in the right medial frontal lobe and right centrum semiovale not appreciable by CT. No visible acute infarction or hemorrhage. No hydrocephalus. Vascular: No hyperdense vessel or unexpected calcification. Skull: No acute calvarial abnormality. 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 No hemorrhage. Atrophy, chronic microvascular disease. Electronically Signed   By: Charlett Nose M.D.   On: 11/08/2022 19:33   ECHOCARDIOGRAM COMPLETE  Result Date: 11/08/2022    ECHOCARDIOGRAM REPORT   Patient Name:   KOBI PLY Date of Exam: 11/08/2022 Medical Rec #:  [id number]        Height:       66.0 in Accession #:    [id number]       Weight:       160.0 lb Date of Birth:  [birth year]        BSA:  1.819 m Patient Age:    62 years         BP:           161/73 mmHg Patient Gender: M                HR:           100 bpm. Exam Location:  Inpatient Procedure: 2D Echo, Cardiac Doppler and Color Doppler Indications:    Stroke I63.9  History:        Patient has prior history of Echocardiogram examinations, most                 recent 02/25/2020. Stroke; Risk Factors:Hypertension.  Sonographer:    Lucendia Herrlich Referring Phys: [id number] RONDELL A SMITH IMPRESSIONS  1. Left ventricular ejection fraction, by estimation, is 60 to 65%. The left ventricle has normal function. The left ventricle has no regional wall motion abnormalities. Left ventricular diastolic parameters are indeterminate.  2. Right ventricular systolic function is normal. The right  ventricular size is normal.  3. Left atrial size was moderately dilated.  4. The mitral valve is normal in structure. Trivial mitral valve regurgitation. No evidence of mitral stenosis. Moderate mitral annular calcification.  5. The aortic valve is tricuspid. There is mild calcification of the aortic valve. There is mild thickening of the aortic valve. Aortic valve regurgitation is mild. Aortic valve sclerosis is present, with no evidence of aortic valve stenosis.  6. Aortic dilatation noted. There is mild dilatation of the ascending aorta, measuring 38 mm.  7. The inferior vena cava is normal in size with greater than 50% respiratory variability, suggesting right atrial pressure of 3 mmHg. FINDINGS  Left Ventricle: Left ventricular ejection fraction, by estimation, is 60 to 65%. The left ventricle has normal function. The left ventricle has no regional wall motion abnormalities. The left ventricular internal cavity size was normal in size. There is  no left ventricular hypertrophy. Left ventricular diastolic parameters are indeterminate. Right Ventricle: The right ventricular size is normal. No increase in right ventricular wall thickness. Right ventricular systolic function is normal. Left Atrium: Left atrial size was moderately dilated. Right Atrium: Right atrial size was normal in size. Pericardium: There is no evidence of pericardial effusion. Mitral Valve: The mitral valve is normal in structure. There is mild thickening of the mitral valve leaflet(s). There is mild calcification of the mitral valve leaflet(s). Moderate mitral annular calcification. Trivial mitral valve regurgitation. No evidence of mitral valve stenosis. Tricuspid Valve: The tricuspid valve is normal in structure. Tricuspid valve regurgitation is not demonstrated. No evidence of tricuspid stenosis. Aortic Valve: The aortic valve is tricuspid. There is mild calcification of the aortic valve. There is mild thickening of the aortic valve. Aortic  valve regurgitation is mild. Aortic valve sclerosis is present, with no evidence of aortic valve stenosis. Aortic valve mean gradient measures 8.0 mmHg. Aortic valve peak gradient measures 16.8 mmHg. Aortic valve area, by VTI measures 2.52 cm. Pulmonic Valve: The pulmonic valve was normal in structure. Pulmonic valve regurgitation is not visualized. No evidence of pulmonic stenosis. Aorta: Aortic dilatation noted. There is mild dilatation of the ascending aorta, measuring 38 mm. Venous: The inferior vena cava is normal in size with greater than 50% respiratory variability, suggesting right atrial pressure of 3 mmHg. IAS/Shunts: No atrial level shunt detected by color flow Doppler.  LEFT VENTRICLE PLAX 2D LVIDd:         4.90 cm   Diastology LVIDs:  3.50 cm   LV e' medial:  7.62 cm/s LV PW:         1.00 cm   LV e' lateral: 9.32 cm/s LV IVS:        1.10 cm LVOT diam:     2.30 cm LV SV:         84 LV SV Index:   46 LVOT Area:     4.15 cm  RIGHT VENTRICLE             IVC RV S prime:     26.70 cm/s  IVC diam: 1.50 cm TAPSE (M-mode): 2.6 cm LEFT ATRIUM           Index LA diam:      4.40 cm 2.42 cm/m LA Vol (A4C): 45.2 ml 24.88 ml/m  AORTIC VALVE AV Area (Vmax):    2.45 cm AV Area (Vmean):   2.52 cm AV Area (VTI):     2.52 cm AV Vmax:           205.00 cm/s AV Vmean:          128.000 cm/s AV VTI:            0.333 m AV Peak Grad:      16.8 mmHg AV Mean Grad:      8.0 mmHg LVOT Vmax:         121.00 cm/s LVOT Vmean:        77.700 cm/s LVOT VTI:          0.202 m LVOT/AV VTI ratio: 0.61  AORTA Ao Root diam: 3.60 cm Ao Asc diam:  3.80 cm TRICUSPID VALVE TR Peak grad:   50.4 mmHg TR Vmax:        355.00 cm/s  SHUNTS Systemic VTI:  0.20 m Systemic Diam: 2.30 cm Charlton Haws MD Electronically signed by Charlton Haws MD Signature Date/Time: 11/08/2022/4:11:51 PM    Final    MR BRAIN WO CONTRAST  Result Date: 11/08/2022 CLINICAL DATA:  Mid-back pain, neuro deficit c/f demyelinating disease; Transient ischemic attack (TIA)  Stroke, follow up Neuro deficit, acute, stroke suspected; Low back pain, no red flags, no prior management; L sided weakness EXAM: MRI HEAD WITHOUT CONTRAST MRI CERVICAL SPINE WITHOUT CONTRAST MRI THORACIC WITHOUT CONTRAST MRI LUMBAR WITHOUT CONTRAST TECHNIQUE: Multiplanar, multiecho pulse sequences of the brain and surrounding structures, and cervical, thoracic, and lumbar spine were obtained without intravenous contrast. COMPARISON:  CT Head 11/08/22 FINDINGS: MRI HEAD FINDINGS Brain: There are acute infarcts in the right ACA territory along the parasagittal right frontal lobe (series 4, image 20) and in the right frontal centrum semiovale (series 3, image 40). No hemorrhage. No extra-axial fluid collection. Hydrocephalus. Sequela of moderate chronic microvascular ischemic change. Generalized volume loss without lobar predominance. Partially empty sella. Vascular: Normal flow voids. Skull and upper cervical spine: Normal marrow signal. Below for cervical spine findings. Sinuses/Orbits: No middle ear or mastoid effusion. Paranasal sinuses are clear bilateral lens replacement. Orbits are otherwise unremarkable. Partially empty sella. Other: None. MRI CERVICAL SPINE FINDINGS Limitations: Assessment is limited due to degree of motion artifact. Additional axial T2 weighted sequences were not acquired. Alignment: Mild retrolisthesis of C3 on C4 and C4 on C5. Grade 1 anterolisthesis of C7 on T1. Vertebrae: No fracture, evidence of discitis, or bone lesion. There is likely degenerative osseous fusion of C5-C6. Severe disc space loss at C3-C4, C4-C5, and C6-C7. Cord: There is likely cord signal abnormality (series 15, image 10) of the C3-C4 level. The appearance  is more suggestive myelomalacia rather than demyelinating disease Posterior Fossa, vertebral arteries, paraspinal tissues: The degree of motion artifact precludes assessment of the paraspinal soft tissues and vascular flow voids Disc levels: Assessment for the degree  of degenerative changes limited due to the degree of motion artifact. 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 MRI THORACIC SPINE FINDINGS Alignment:  Physiologic. Vertebrae: No fracture, evidence of discitis, or bone lesion. Cord:  Normal signal and morphology. Paraspinal and other soft tissues: Negative. Disc levels: No definite evidence of high-grade spinal canal or neural foraminal stenosis. MRI LUMBAR SPINE FINDINGS Limitations: Markedly limited assessment of the lumbar spine vertebral bodies on the sagittal STIR sequences due to the degree of motion artifact Segmentation:  Standard. Alignment: Trace retrolisthesis of L2 on L3. Grade 1 anterolisthesis L4 on L5. Vertebrae:  No fracture, evidence of discitis, or bone lesion. Conus medullaris and cauda equina: Conus extends to the L1 level. Conus and cauda equina appear normal. Paraspinal and other soft tissues: There is asymmetric atrophy of the right psoas muscle. There is also atrophy of the paraspinal musculature. Disc levels: Assessment for the degree of degenerative changes limited due to degree of motion artifact. Within this limitation there is moderate to severe spinal canal narrowing at L3-L4, L4-L5. There is severe right-sided neural foraminal narrowing at L4-L5. Moderate right-sided neural foraminal narrowing at L3-L4 and L5-S1 Severe facet degenerative change at L4-L5 and L5-S1 fluid in the facet joints. IMPRESSION: 1. 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 No hemorrhage. 2. Assessment of the cervical spine is markedly limited due to the degree of motion artifact. 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 3. Assessment of the lumbar spine is  markedly limited due to the degree of motion artifact. Within this limitation, there is moderate to severe spinal canal narrowing at L3-L4 and L4-L5 and severe right-sided neural foraminal narrowing at L4-L5. 4. Assessment of the thoracic spine is limited due to a combination of motion artifact on the sagittal sequences and lack of axial T2 weighted sequences within this limitation, no definite evidence of high grade spinal canal stenosis or cord signal abnormality. Findings were discussed with Dr. Rodena Medin on 11/08/22 at 1:24 PM. Electronically Signed   By: Lorenza Cambridge M.D.   On: 11/08/2022 13:25   MR THORACIC SPINE WO CONTRAST  Result Date: 11/08/2022 CLINICAL DATA:  Mid-back pain, neuro deficit c/f demyelinating disease; Transient ischemic attack (TIA) Stroke, follow up Neuro deficit, acute, stroke suspected; Low back pain, no red flags, no prior management; L sided weakness EXAM: MRI HEAD WITHOUT CONTRAST MRI CERVICAL SPINE WITHOUT CONTRAST MRI THORACIC WITHOUT CONTRAST MRI LUMBAR WITHOUT CONTRAST TECHNIQUE: Multiplanar, multiecho pulse sequences of the brain and surrounding structures, and cervical, thoracic, and lumbar spine were obtained without intravenous contrast. COMPARISON:  CT Head 11/08/22 FINDINGS: MRI HEAD FINDINGS Brain: There are acute infarcts in the right ACA territory along the parasagittal right frontal lobe (series 4, image 20) and in the right frontal centrum semiovale (series 3, image 40). No hemorrhage. No extra-axial fluid collection. Hydrocephalus. Sequela of moderate chronic microvascular ischemic change. Generalized volume loss without lobar predominance. Partially empty sella. Vascular: Normal flow voids. Skull and upper cervical spine: Normal marrow signal. Below for cervical spine findings. Sinuses/Orbits:  No middle ear or mastoid effusion. Paranasal sinuses are clear bilateral lens replacement. Orbits are otherwise unremarkable. Partially empty sella. Other: None. MRI CERVICAL SPINE  FINDINGS Limitations: Assessment is limited due to degree of motion artifact. Additional axial T2 weighted sequences were not acquired. Alignment: Mild retrolisthesis of C3 on C4 and C4 on C5. Grade 1 anterolisthesis of C7 on T1. Vertebrae: No fracture, evidence of discitis, or bone lesion. There is likely degenerative osseous fusion of C5-C6. Severe disc space loss at C3-C4, C4-C5, and C6-C7. Cord: There is likely cord signal abnormality (series 15, image 10) of the C3-C4 level. The appearance is more suggestive myelomalacia rather than demyelinating disease Posterior Fossa, vertebral arteries, paraspinal tissues: The degree of motion artifact precludes assessment of the paraspinal soft tissues and vascular flow voids Disc levels: Assessment for the degree of degenerative changes limited due to the degree of motion artifact. 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 MRI THORACIC SPINE FINDINGS Alignment:  Physiologic. Vertebrae: No fracture, evidence of discitis, or bone lesion. Cord:  Normal signal and morphology. Paraspinal and other soft tissues: Negative. Disc levels: No definite evidence of high-grade spinal canal or neural foraminal stenosis. MRI LUMBAR SPINE FINDINGS Limitations: Markedly limited assessment of the lumbar spine vertebral bodies on the sagittal STIR sequences due to the degree of motion artifact Segmentation:  Standard. Alignment: Trace retrolisthesis of L2 on L3. Grade 1 anterolisthesis L4 on L5. Vertebrae:  No fracture, evidence of discitis, or bone lesion. Conus medullaris and cauda equina: Conus extends to the L1 level. Conus and cauda equina appear normal. Paraspinal and other soft tissues: There is asymmetric atrophy of the right psoas muscle. There is also atrophy of the paraspinal musculature. Disc levels: Assessment for the degree of degenerative changes limited due to degree of motion artifact. Within this limitation there is moderate to  severe spinal canal narrowing at L3-L4, L4-L5. There is severe right-sided neural foraminal narrowing at L4-L5. Moderate right-sided neural foraminal narrowing at L3-L4 and L5-S1 Severe facet degenerative change at L4-L5 and L5-S1 fluid in the facet joints. IMPRESSION: 1. 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 No hemorrhage. 2. Assessment of the cervical spine is markedly limited due to the degree of motion artifact. 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 3. Assessment of the lumbar spine is markedly limited due to the degree of motion artifact. Within this limitation, there is moderate to severe spinal canal narrowing at L3-L4 and L4-L5 and severe right-sided neural foraminal narrowing at L4-L5. 4. Assessment of the thoracic spine is limited due to a combination of motion artifact on the sagittal sequences and lack of axial T2 weighted sequences within this limitation, no definite evidence of high grade spinal canal stenosis or cord signal abnormality. Findings were discussed with Dr. Rodena Medin on 11/08/22 at 1:24 PM. Electronically Signed   By: Lorenza Cambridge M.D.   On: 11/08/2022 13:25   MR LUMBAR SPINE WO CONTRAST  Result Date: 11/08/2022 CLINICAL DATA:  Mid-back pain, neuro deficit c/f demyelinating disease; Transient ischemic attack (TIA) Stroke, follow up Neuro deficit, acute, stroke suspected; Low back pain, no red flags, no prior management; L sided weakness EXAM: MRI HEAD WITHOUT CONTRAST MRI CERVICAL SPINE WITHOUT CONTRAST MRI THORACIC WITHOUT CONTRAST MRI LUMBAR  WITHOUT CONTRAST TECHNIQUE: Multiplanar, multiecho pulse sequences of the brain and surrounding structures, and cervical, thoracic, and lumbar spine were obtained without intravenous  contrast. COMPARISON:  CT Head 11/08/22 FINDINGS: MRI HEAD FINDINGS Brain: There are acute infarcts in the right ACA territory along the parasagittal right frontal lobe (series 4, image 20) and in the right frontal centrum semiovale (series 3, image 40). No hemorrhage. No extra-axial fluid collection. Hydrocephalus. Sequela of moderate chronic microvascular ischemic change. Generalized volume loss without lobar predominance. Partially empty sella. Vascular: Normal flow voids. Skull and upper cervical spine: Normal marrow signal. Below for cervical spine findings. Sinuses/Orbits: No middle ear or mastoid effusion. Paranasal sinuses are clear bilateral lens replacement. Orbits are otherwise unremarkable. Partially empty sella. Other: None. MRI CERVICAL SPINE FINDINGS Limitations: Assessment is limited due to degree of motion artifact. Additional axial T2 weighted sequences were not acquired. Alignment: Mild retrolisthesis of C3 on C4 and C4 on C5. Grade 1 anterolisthesis of C7 on T1. Vertebrae: No fracture, evidence of discitis, or bone lesion. There is likely degenerative osseous fusion of C5-C6. Severe disc space loss at C3-C4, C4-C5, and C6-C7. Cord: There is likely cord signal abnormality (series 15, image 10) of the C3-C4 level. The appearance is more suggestive myelomalacia rather than demyelinating disease Posterior Fossa, vertebral arteries, paraspinal tissues: The degree of motion artifact precludes assessment of the paraspinal soft tissues and vascular flow voids Disc levels: Assessment for the degree of degenerative changes limited due to the degree of motion artifact. 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 MRI THORACIC SPINE FINDINGS Alignment:  Physiologic. Vertebrae: No fracture, evidence of discitis, or bone lesion. Cord:  Normal signal and morphology. Paraspinal and other soft tissues: Negative. Disc levels: No definite evidence of high-grade spinal canal  or neural foraminal stenosis. MRI LUMBAR SPINE FINDINGS Limitations: Markedly limited assessment of the lumbar spine vertebral bodies on the sagittal STIR sequences due to the degree of motion artifact Segmentation:  Standard. Alignment: Trace retrolisthesis of L2 on L3. Grade 1 anterolisthesis L4 on L5. Vertebrae:  No fracture, evidence of discitis, or bone lesion. Conus medullaris and cauda equina: Conus extends to the L1 level. Conus and cauda equina appear normal. Paraspinal and other soft tissues: There is asymmetric atrophy of the right psoas muscle. There is also atrophy of the paraspinal musculature. Disc levels: Assessment for the degree of degenerative changes limited due to degree of motion artifact. Within this limitation there is moderate to severe spinal canal narrowing at L3-L4, L4-L5. There is severe right-sided neural foraminal narrowing at L4-L5. Moderate right-sided neural foraminal narrowing at L3-L4 and L5-S1 Severe facet degenerative change at L4-L5 and L5-S1 fluid in the facet joints. IMPRESSION: 1. 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 No hemorrhage. 2. Assessment of the cervical spine is markedly limited due to the degree of motion artifact. 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 3. Assessment of the lumbar spine is markedly limited due to the degree of motion artifact. Within this limitation, there is moderate to severe spinal canal narrowing at L3-L4 and L4-L5 and severe right-sided neural foraminal narrowing at L4-L5. 4. Assessment of the thoracic spine is limited due to a combination of motion artifact on the sagittal sequences and lack of axial  T2 weighted sequences within this limitation, no definite evidence of high grade  spinal canal stenosis or cord signal abnormality. Findings were discussed with Dr. Rodena Medin on 11/08/22 at 1:24 PM. Electronically Signed   By: Lorenza Cambridge M.D.   On: 11/08/2022 13:25   MR CERVICAL SPINE WO CONTRAST  Result Date: 11/08/2022 CLINICAL DATA:  Mid-back pain, neuro deficit c/f demyelinating disease; Transient ischemic attack (TIA) Stroke, follow up Neuro deficit, acute, stroke suspected; Low back pain, no red flags, no prior management; L sided weakness EXAM: MRI HEAD WITHOUT CONTRAST MRI CERVICAL SPINE WITHOUT CONTRAST MRI THORACIC WITHOUT CONTRAST MRI LUMBAR WITHOUT CONTRAST TECHNIQUE: Multiplanar, multiecho pulse sequences of the brain and surrounding structures, and cervical, thoracic, and lumbar spine were obtained without intravenous contrast. COMPARISON:  CT Head 11/08/22 FINDINGS: MRI HEAD FINDINGS Brain: There are acute infarcts in the right ACA territory along the parasagittal right frontal lobe (series 4, image 20) and in the right frontal centrum semiovale (series 3, image 40). No hemorrhage. No extra-axial fluid collection. Hydrocephalus. Sequela of moderate chronic microvascular ischemic change. Generalized volume loss without lobar predominance. Partially empty sella. Vascular: Normal flow voids. Skull and upper cervical spine: Normal marrow signal. Below for cervical spine findings. Sinuses/Orbits: No middle ear or mastoid effusion. Paranasal sinuses are clear bilateral lens replacement. Orbits are otherwise unremarkable. Partially empty sella. Other: None. MRI CERVICAL SPINE FINDINGS Limitations: Assessment is limited due to degree of motion artifact. Additional axial T2 weighted sequences were not acquired. Alignment: Mild retrolisthesis of C3 on C4 and C4 on C5. Grade 1 anterolisthesis of C7 on T1. Vertebrae: No fracture, evidence of discitis, or bone lesion. There is likely degenerative osseous fusion of C5-C6. Severe disc space loss at C3-C4, C4-C5, and C6-C7. Cord: There is likely  cord signal abnormality (series 15, image 10) of the C3-C4 level. The appearance is more suggestive myelomalacia rather than demyelinating disease Posterior Fossa, vertebral arteries, paraspinal tissues: The degree of motion artifact precludes assessment of the paraspinal soft tissues and vascular flow voids Disc levels: Assessment for the degree of degenerative changes limited due to the degree of motion artifact. 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 MRI THORACIC SPINE FINDINGS Alignment:  Physiologic. Vertebrae: No fracture, evidence of discitis, or bone lesion. Cord:  Normal signal and morphology. Paraspinal and other soft tissues: Negative. Disc levels: No definite evidence of high-grade spinal canal or neural foraminal stenosis. MRI LUMBAR SPINE FINDINGS Limitations: Markedly limited assessment of the lumbar spine vertebral bodies on the sagittal STIR sequences due to the degree of motion artifact Segmentation:  Standard. Alignment: Trace retrolisthesis of L2 on L3. Grade 1 anterolisthesis L4 on L5. Vertebrae:  No fracture, evidence of discitis, or bone lesion. Conus medullaris and cauda equina: Conus extends to the L1 level. Conus and cauda equina appear normal. Paraspinal and other soft tissues: There is asymmetric atrophy of the right psoas muscle. There is also atrophy of the paraspinal musculature. Disc levels: Assessment for the degree of degenerative changes limited due to degree of motion artifact. Within this limitation there is moderate to severe spinal canal narrowing at L3-L4, L4-L5. There is severe right-sided neural foraminal narrowing at L4-L5. Moderate right-sided neural foraminal narrowing at L3-L4 and L5-S1 Severe facet degenerative change at L4-L5 and L5-S1 fluid in the facet joints. IMPRESSION: 1. 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 No hemorrhage.  2. Assessment of the  cervical spine is markedly limited due to the degree of motion artifact. 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 3. Assessment of the lumbar spine is markedly limited due to the degree of motion artifact. Within this limitation, there is moderate to severe spinal canal narrowing at L3-L4 and L4-L5 and severe right-sided neural foraminal narrowing at L4-L5. 4. Assessment of the thoracic spine is limited due to a combination of motion artifact on the sagittal sequences and lack of axial T2 weighted sequences within this limitation, no definite evidence of high grade spinal canal stenosis or cord signal abnormality. Findings were discussed with Dr. Rodena Medin on 11/08/22 at 1:24 PM. Electronically Signed   By: Lorenza Cambridge M.D.   On: 11/08/2022 13:25   CT HEAD CODE STROKE WO CONTRAST  Result Date: 11/08/2022 CLINICAL DATA:  Code stroke.  Left-sided weakness EXAM: CT HEAD WITHOUT CONTRAST TECHNIQUE: Contiguous axial images were obtained from the base of the skull through the vertex without intravenous contrast. RADIATION DOSE REDUCTION: This exam was performed according to the departmental dose-optimization program which includes automated exposure control, adjustment of the mA and/or kV according to patient size and/or use of iterative reconstruction technique. COMPARISON:  None Available. FINDINGS: Brain: No evidence of acute infarction, hemorrhage, hydrocephalus, extra-axial collection or mass lesion/mass effect. Chronic small vessel ischemia in the cerebral white matter to a moderate degree. Discrete and chronic appearing infarct at the left putamen and medial right thalamus. Moderate cerebral volume loss which is generalized. Vascular: No hyperdense vessel or unexpected calcification. Skull: Normal. Negative for fracture or  focal lesion. Sinuses/Orbits: No acute finding. Other: Prelim sent to Dr. Selina Cooley in epic chat. ASPECTS [facility] Stroke Program Early CT Score) - Ganglionic level infarction (caudate, lentiform nuclei, internal capsule, insula, M1-M3 cortex): 7 - Supraganglionic infarction (M4-M6 cortex): 3 Total score (0-10 with 10 being normal): 10 IMPRESSION: 1. No acute finding. 2. 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 Electronically Signed   By: Tiburcio Pea M.D.   On: 11/08/2022 09:38       The results of significant diagnostics from this hospitalization (including imaging, microbiology, ancillary and laboratory) are listed below for reference.     Microbiology: No results found for this or any previous visit (from the past 240 hour(s)).   Labs:  CBC: Recent Labs  Lab 11/08/22 0916 11/08/22 0931 11/10/22 0142 11/13/22 0525  WBC 8.3  --  8.5 10.9*  NEUTROABS 6.8  --   --   --   HGB 10.6* 9.5* 12.6* 11.5*  HCT 32.7* 28.0* 38.1* 33.6*  MCV 103.8*  --  98.7 93.6  PLT 141*  --  179 176   BMP &GFR Recent Labs  Lab 11/08/22 0916 11/08/22 0931 11/08/22 2103 11/09/22 0424 11/10/22 0142 11/11/22 0532 11/13/22 0525  NA 138 147* 136  --  134* 135 133*  K 3.9 2.8* 3.7  --  3.5 3.5 3.5  CL 110 115* 106  --  106 107 105  CO2 17*  --  20*  --  19* 21* 20*  GLUCOSE 114* 91 130*  --  153* 132* 115*  BUN 13 12 12   --  9 8 12   CREATININE 0.79 0.40* 0.77  --  0.80 0.77 0.69  CALCIUM 8.0*  --  8.5*  --  8.3* 8.1* 8.1*  MG  --   --   --  1.9 1.8 1.9 1.9  PHOS  --   --   --   --  3.1 2.3* 3.4   Estimated Creatinine Clearance: 68.7 mL/min (by C-G formula based on SCr of 0.69 mg/dL). Liver & Pancreas: Recent Labs  Lab 11/08/22 0916 11/10/22 0142 11/11/22 0532 11/13/22 0525  AST 29  --   --   --   ALT 20  --   --   --   ALKPHOS 47  --   --   --   BILITOT 1.0  --   --   --   PROT 6.0*  --   --   --   ALBUMIN 3.5 3.2* 3.0* 3.0*    No results for input(s): "LIPASE", "AMYLASE" in the last 168 hours. Recent Labs  Lab 11/11/22 0532  AMMONIA 26   Diabetic: No results for input(s): "HGBA1C" in the last 72 hours. Recent Labs  Lab 11/08/22 0921  GLUCAP 79   Cardiac Enzymes: Recent Labs  Lab 11/08/22 2103  CKTOTAL 606*   No results for input(s): "PROBNP" in the last 8760 hours. Coagulation Profile: Recent Labs  Lab 11/08/22 0916  INR 1.2   Thyroid Function Tests: No results for input(s): "TSH", "T4TOTAL", "FREET4", "T3FREE", "THYROIDAB" in the last 72 hours. Lipid Profile: No results for input(s): "CHOL", "HDL", "LDLCALC", "TRIG", "CHOLHDL", "LDLDIRECT" in the last 72 hours. Anemia Panel: No results for input(s): "VITAMINB12", "FOLATE", "FERRITIN", "TIBC", "IRON", "RETICCTPCT" in the last 72 hours. Urine analysis:    Component Value Date/Time   COLORURINE YELLOW 02/25/2020 0200   APPEARANCEUR CLEAR 02/25/2020 0200   LABSPEC 1.025 02/25/2020 0200   PHURINE 5.0 02/25/2020 0200   GLUCOSEU NEGATIVE 02/25/2020 0200   HGBUR NEGATIVE 02/25/2020 0200   HGBUR trace-intact 04/13/2009 0855   BILIRUBINUR NEGATIVE 02/25/2020 0200   BILIRUBINUR n 11/23/2011 0836   KETONESUR 20 (A) 02/25/2020 0200   PROTEINUR 30 (A) 02/25/2020 0200   UROBILINOGEN 0.2 11/23/2011 0836   UROBILINOGEN 0.2 04/13/2009 0855   NITRITE NEGATIVE 02/25/2020 0200   LEUKOCYTESUR NEGATIVE 02/25/2020 0200   Sepsis Labs: Invalid input(s): "PROCALCITONIN", "LACTICIDVEN"   SIGNED:  Almon Hercules, MD  [facility] 11/14/2022, 3:51 PM

## 2022-11-14 NOTE — Plan of Care (Signed)
  Problem: Education: Goal: Knowledge of disease or condition will improve Outcome: Progressing Goal: Knowledge of secondary prevention will improve (MUST DOCUMENT ALL) Outcome: Progressing Goal: Knowledge of patient specific risk factors will improve Loraine Leriche N/A or DELETE if not current risk factor) Outcome: Progressing   Problem: Education: Goal: Knowledge of disease or condition will improve Outcome: Progressing Goal: Knowledge of secondary prevention will improve (MUST DOCUMENT ALL) Outcome: Progressing Goal: Knowledge of patient specific risk factors will improve Loraine Leriche N/A or DELETE if not current risk factor) Outcome: Progressing

## 2022-11-14 NOTE — H&P (Signed)
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  He awoke that morning and attempted to walk to the bathroom but fell because of left-sided weakness.  His wife noted an episode of bowel incontinence.  Allergy consulted and was outside of the window for tenecteplase.  He was admitted to the hospitalist service. Was reassessed later in the afternoon and noted to have twitching/jerking movements in bilateral lower extremities left worse than right.  CT of the head without acute findings.  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  No hemorrhage.  MRI also performed of the complete spine but limited due to motion artifact.  Pete CT head was performed as well as EEG.  CTA of the head with intracranial atherosclerotic disease with multifocal stenoses, most notably of a moderate degree of the left paraclinoid ICA.  2D echo with EF of 60 to 65% and left atrial dilation.  Hemoglobin A1c 5.7%.  He was started on aspirin and Plavix for 3 weeks followed by Plavix alone.  Tolerating diet. BP controlled. 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   Review of Systems  Unable to perform ROS: Mental acuity        Past Medical History:  Diagnosis Date   Allergic rhinitis     Anxiety     Detached retina     Heart murmur      has had one when a child and checked out ok  never? had an echo   History of chicken pox     Hx of nonmelanoma skin cancer     Hypertension     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/24/2020    Low b12 elevated mma  Positive intrinsic factor  aby.  elevated MCV  No other sx          Past Surgical History:   Procedure Laterality Date   CATARACT EXTRACTION   2009    [facility]   CHOLECYSTECTOMY N/A 02/25/2020    Procedure: LAPAROSCOPIC CHOLECYSTECTOMY;  Surgeon: Harriette Bouillon, MD;  Location: [facility] OR;  Service: General;  Laterality: N/A;         Family History  Problem Relation Age of Onset   Arthritis Mother     Hypertension Mother     Arthritis Father     Hypertension Father      Social History:  reports that he has never smoked. He has never used smokeless tobacco. He reports current alcohol use of about 14.0 - 21.0 standard drinks of alcohol per week. He reports that he does not use drugs. Allergies:       Allergies  Allergen Reactions   Cinnamon Other (See Comments)      intolerance   Other Other (See Comments)      All kind of nuts except peanuts - intolerance   Pollen Extract-Tree Extract [Pollen Extract]            Medications Prior to Admission  Medication Sig Dispense Refill   aspirin EC 81 MG tablet Take 81 mg by mouth daily.       calcium carbonate (TUMS - DOSED IN MG ELEMENTAL CALCIUM) 500 MG chewable tablet Chew  1,000 mg by mouth daily as needed for indigestion or heartburn.       chlorpheniramine (CHLOR-TRIMETON) 4 MG tablet Take 4 mg by mouth 2 (two) times daily as needed for allergies.       diphenhydrAMINE (BENADRYL) 25 MG tablet Take 25 mg by mouth every 6 (six) hours as needed for itching or allergies.       lisinopril (ZESTRIL) 20 MG tablet TAKE 1 TABLET BY MOUTH EVERY DAY (Patient taking differently: Take 20 mg by mouth daily.) 90 tablet 1   metoprolol tartrate (LOPRESSOR) 25 MG tablet TAKE 1 TABLET BY MOUTH TWICE A DAY (Patient taking differently: Take 25 mg by mouth 2 (two) times daily.) 180 tablet 2   Multiple Vitamin (MULTIVITAMIN WITH MINERALS) TABS tablet Take 1 tablet by mouth daily.       niacin 500 MG tablet Take 500 mg by mouth daily.       Polyethyl Glycol-Propyl Glycol (SYSTANE) 0.4-0.3 % GEL ophthalmic gel Place 1 Application into both eyes  daily as needed.              Home: Home Living Family/patient expects to be discharged to:: Private residence Living Arrangements: Spouse/significant other Available Help at Discharge: Family, Available 24 hours/day Type of Home: House Home Access: Stairs to enter Secretary/administrator of Steps: 1 Entrance Stairs-Rails: None Home Layout: One level Bathroom Shower/Tub: Health visitor: Standard Home Equipment: Information systems manager - built in, [employer] - tub/shower  Lives With: Spouse   Functional History: Prior Function Prior Level of Function : Independent/Modified Independent, Driving Mobility Comments: Shuffling gait pattern at baseline, intermittently places a hand on furniture/walls but not often; no other falls other than this one   Functional Status:  Mobility: Bed Mobility Overal bed mobility: Needs Assistance Bed Mobility: Rolling, Supine to Sit Rolling: Mod assist, Max assist Supine to sit: Total assist, HOB elevated Sit to supine: Total assist, HOB elevated General bed mobility comments: pt able to flex Rt LE and punch Rt UE upwards and contralaterally towards bed rail for Lt rolling. Max asisst to complete to Lt and Max +2 to roll Rt. pt had BM and total assist for pericare. pt able to bring Rt LE off EOB with verbal cues. Max assist to bring Lt LE off and press trunk upright to sit EOB. Transfers Overall transfer level: Needs assistance Equipment used: 2 person hand held assist Transfers: Sit to/from Stand, Bed to chair/wheelchair/BSC Sit to Stand: Max assist, +2 safety/equipment Bed to/from chair/wheelchair/BSC transfer type:: Via Lift equipment Stand pivot transfers: +2 safety/equipment, Max assist Transfer via Lift Equipment: Stedy General transfer comment: Stedy positioned for sit<>stand to trial for bed>chair transfer. Pt able to use Rt UE and LE to power up. manual blocking to prevent Lt lean and support Lt UE. mod-max+2 for stand and to bring  paddles down for seated position on Stedy. dependent transfer bed>chair. Pt required increased assist to rise from stedy paddles and control lower to chair. Ambulation/Gait General Gait Details: unable   ADL: ADL Overall ADL's : Needs assistance/impaired General ADL Comments: total A for all basic ADLs at bed level or supported sitting in recliner   Cognition: Cognition Overall Cognitive Status: Impaired/Different from baseline Arousal/Alertness: Awake/alert Orientation Level: Oriented to person Year: 2024 Month: July Day of Week: Correct Attention: Focused, Sustained Focused Attention: Impaired Focused Attention Impairment: Verbal basic, Verbal complex, Functional basic Sustained Attention: Impaired Memory: Impaired Memory Impairment: Decreased short term memory, Decreased recall of new information Awareness: Impaired  Problem Solving: Impaired Problem Solving Impairment: Verbal complex, Functional basic, Functional complex Executive Function: Organizing, Self Monitoring Organizing: Impaired Self Monitoring: Impaired Safety/Judgment: Impaired Cognition Arousal/Alertness: Lethargic Behavior During Therapy: Flat affect Overall Cognitive Status: Impaired/Different from baseline Area of Impairment: Attention, Memory, Following commands, Safety/judgement, Awareness, Problem solving Current Attention Level: Focused Memory: Decreased short-term memory Following Commands: Follows one step commands with increased time, Follows one step commands consistently Safety/Judgement: Decreased awareness of safety, Decreased awareness of deficits Awareness: Intellectual Problem Solving: Slow processing, Decreased initiation, Difficulty sequencing, Requires verbal cues, Requires tactile cues General Comments: pt with improved alertness and engaging in therapy session. pt answering questions appropriately with 1-3 word responses and extra time to process. pt showing thumbs up or down to relay if  feeling comfortable/good. pt able to keep Rt eye ~50% open throughout session, Lt eye remains mostly closed. pt showing no signs of discomfort throughout session. wife present at bedside and pt able to recognize and name her. Lt neglect/inattention.   Physical Exam: Blood pressure (!) 166/54, pulse 71, temperature 98.9 F (37.2 C), temperature source Oral, resp. rate 18, height 5\' 6"  (1.676 m), weight 72.6 kg, SpO2 96 %. Physical Exam Constitutional:      Appearance: He is ill-appearing.  HENT:     Head: Normocephalic and atraumatic.     Right Ear: External ear normal.     Left Ear: External ear normal.     Nose: Nose normal.  Eyes:     Pupils: Pupils are equal, round, and reactive to light.  Cardiovascular:     Rate and Rhythm: Normal rate and regular rhythm.     Heart sounds: No murmur heard.    No gallop.  Pulmonary:     Effort: Pulmonary effort is normal. No respiratory distress.  Abdominal:     General: Bowel sounds are normal. There is no distension.     Palpations: Abdomen is soft.  Musculoskeletal:        General: No deformity.     Cervical back: Normal range of motion.     Right lower leg: No edema.     Left lower leg: No edema.  Skin:    General: Skin is warm and dry.  Neurological:     Mental Status: He is alert.     Comments: Pt is alert but very flat. Very slow to process and speak. Spoke a few sentences in response to questions throughout the meeting, occasionally would give an affirmative head nod. Right gaze preference. Moves right side fairly spontaneously but did not consistently cooperate with MMT testing. LUE 0/5, LLE 0/5 with sl early extensor tone in LLE. LUE fairly loose. DTR's brisk at 2+ LUE and LLE and 1+ on right. Decreased LT and pain on left lower more so than upper limb. Was able to discern pain from LT in the LUE to a certain extent.   Psychiatric:     Comments: Flat and disengaged        Lab Results Last 48 Hours        Results for orders  placed or performed during the hospital encounter of 11/08/22 (from the past 48 hour(s))  Renal function panel     Status: Abnormal    Collection Time: 11/13/22  5:25 AM  Result Value Ref Range    Sodium 133 (L) 135 - 145 mmol/L    Potassium 3.5 3.5 - 5.1 mmol/L    Chloride 105 98 - 111 mmol/L    CO2 20 (L) 22 -  32 mmol/L    Glucose, Bld 115 (H) 70 - 99 mg/dL      Comment: Glucose reference range applies only to samples taken after fasting for at least 8 hours.    BUN 12 8 - 23 mg/dL    Creatinine, Ser 1.61 0.61 - 1.24 mg/dL    Calcium 8.1 (L) 8.9 - 10.3 mg/dL    Phosphorus 3.4 2.5 - 4.6 mg/dL    Albumin 3.0 (L) 3.5 - 5.0 g/dL    GFR, Estimated >09 >60 mL/min      Comment: (NOTE) Calculated using the CKD-EPI Creatinine Equation (2021)      Anion gap 8 5 - 15      Comment: Performed at [facility], [address] [place], [place] [address]  Magnesium     Status: None    Collection Time: 11/13/22  5:25 AM  Result Value Ref Range    Magnesium 1.9 1.7 - 2.4 mg/dL      Comment: Performed at [facility], [address], [place], [place] [address]  CBC     Status: Abnormal    Collection Time: 11/13/22  5:25 AM  Result Value Ref Range    WBC 10.9 (H) 4.0 - 10.5 K/uL    RBC 3.59 (L) 4.22 - 5.81 MIL/uL    Hemoglobin 11.5 (L) 13.0 - 17.0 g/dL    HCT 47.8 (L) 29.5 - 52.0 %    MCV 93.6 80.0 - 100.0 fL    MCH 32.0 26.0 - 34.0 pg    MCHC 34.2 30.0 - 36.0 g/dL    RDW 62.1 30.8 - 65.7 %    Platelets 176 150 - 400 K/uL    nRBC 0.0 0.0 - 0.2 %      Comment: Performed at [facility], [address], [place], [place] [address]       Imaging Results (Last 48 hours)  DG Abd Portable 1V   Result Date: 11/13/2022 CLINICAL DATA:  Abdominal distension EXAM: PORTABLE ABDOMEN - 1 VIEW COMPARISON:  Abdominal CT 02/25/2020 FINDINGS: Diffuse gaseous distension of bowel suggesting adynamic ileus, reaching the rectum. No abnormal stool retention. No concerning mass effect or  calcification. Cholecystectomy clips. Cardiomegaly. Clear lung bases. Artifact from EKG leads. IMPRESSION: Generalized gaseous distension of bowel compatible with ileus. Electronically Signed   By: Tiburcio Pea M.D.   On: 11/13/2022 10:55           Blood pressure (!) 166/54, pulse 71, temperature 98.9 F (37.2 C), temperature source Oral, resp. rate 18, height 5\' 6"  (1.676 m), weight 72.6 kg, SpO2 96 %.   Medical Problem List and Plan: 1. 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  Antithrombotics: -DVT/anticoagulation:  Pharmaceutical: Lovenox--begin 40mg  daily             -needs screening venous dopplers             -antiplatelet therapy: Aspirin and Plavix for three weeks followed by Plavix alone.  Last dose of aspirin will be 7/29   3. Pain Management: Tylenol as needed   4. Mood/Behavior/Sleep: LCSW to  evaluate and provide emotional support             -antipsychotic agents: n/a   5. Neuropsych/cognition: This patient is not capable of making decisions on his own behalf.             -pt has hx of ETOH abuse, 2-3 beers + wine most nights 6. Skin/Wound Care: Routine skin care checks   7. Fluids/Electrolytes/Nutrition: Routine Is and Os and follow-up chemistries in AM             -continue MVI, thiamine   8: Hypertension: monitor TID and prn (home meds: lisinopril, Lopressor)             -continue amlodipine 5 mg daily              9: Hyperlipidemia: continue statin   10: Prediabetes: ? diet controlled; A1c = 5.7%   11: Macrocytic anemia/B12 deficiency: follow-up CBC             -hx of pernicious anemia; PCP rec: daily B12 12. Ileus:             -moving bowels now. Had bm this morning             -senna-s at bedtime             -PPI, prn simethicone 13.  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 Thyroid nodule-TFT's essentially WNL             -f/u as outpt, thyroid U/S, etc.         Milinda Antis, PA-C 11/14/2022  I have personally performed a face to face diagnostic evaluation of this patient and formulated the key components of the plan.  Additionally, I have personally reviewed laboratory data, imaging studies, as well as relevant notes and concur with the physician assistant's documentation above.  The patient's status has not changed from the original H&P.  Any changes in documentation from the acute care chart have been noted above.  Ranelle Oyster, MD, [place]

## 2022-11-14 NOTE — Progress Notes (Signed)
PMR Admission Coordinator Pre-Admission Assessment   Patient: Samuel Pearson is an 77 y.o., male MRN: [id number] DOB: 1945/07/03 Height: 5\' 6"  (167.6 cm) Weight: 72.6 kg   Insurance Information HMO: yes    PPO:      PCP:      IPA:      80/20:      OTHER:  PRIMARY: [employer] Medicare      Policy#: [id number]      Subscriber: pt CM Name:       Phone#: [phone]     Fax#: [phone] Pre-Cert#: [id number]  auth for CIR from [facility] (ref # [id number]) 11/12/22 for admit 7/6 with updates due to fax listed above on 11/18/22        Benefits:  Phone #: [phone]     Name:  Eff. Date: 05/09/22     Deduct: $0      Out of Pocket Max: $3600 ( met $40)      Life Max: na/ CIR: $295/day for days 1-5      SNF: 20 full days Outpatient:      Co-Pay: $20 Home Health: 100%      Co-Pay:  DME: 80%     Co-Pay: 20% Providers:  SECONDARY:       Policy#:      Phone#:    Artist:       Phone#:    The Engineer, materials Information Summary" for patients in Inpatient Rehabilitation Facilities with attached "Privacy Act Statement-Health Care Records" was provided and verbally reviewed with: Patient and Family   Emergency Contact Information Contact Information       Name Relation Home Work Mobile    [place] Spouse [phone]   [phone]           Current Medical History  Patient Admitting Diagnosis: CVA    History of Present Illness: Pt is a 76 y/o male with PMH of heart murmur, HTN, anemia, and anxiety, admitted to [facility] on 7/2 with c/o LLE weakness and back pain.  1 fall immediately PTA.  CT head notable only for atrophy and chronic small vessel ischemia.  Afebrile with HR 90-102, RR 13-27, BP up to 182 systolic.  Labs notable for hbg 9.5, platelets 141, and calcium 8.  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  Plan for loop recorder at discharge.  Therapy evaluations completed and pt was recommended for CIR.      Complete NIHSS TOTAL: 15   Patient's medical record from [facility] has been reviewed by the rehabilitation admission coordinator and physician.   Past Medical History      Past Medical History:  Diagnosis Date   Allergic rhinitis     Anxiety     Detached retina     Heart murmur      has had one when a child and checked out ok  never? had an echo   History of chicken pox     Hx of nonmelanoma skin cancer     Hypertension     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/24/2020    Low b12 elevated mma  Positive intrinsic factor  aby.  elevated MCV  No other sx       Has the patient had major surgery during 100 days prior to admission? Yes  Family History   family history includes Arthritis in his father and mother; Hypertension in his father and mother.   Current Medications   Current Facility-Administered Medications:    acetaminophen (TYLENOL) tablet 650 mg, 650 mg, Oral, Q4H PRN **OR** acetaminophen (TYLENOL) 160 MG/5ML solution 650 mg, 650 mg, Per Tube, Q4H PRN **OR** acetaminophen (TYLENOL) suppository 650 mg, 650 mg, Rectal, Q4H PRN, Smith, Rondell A, MD   atorvastatin (LIPITOR) tablet 40 mg, 40 mg, Oral, Daily, Shafer, Devon, NP, 40 mg at 11/11/22 0847   [COMPLETED] cyanocobalamin (VITAMIN B12) injection 1,000 mcg, 1,000 mcg, Intramuscular, Once, 1,000 mcg at 11/09/22 1050 **FOLLOWED BY** cyanocobalamin (VITAMIN B12) tablet 1,000 mcg, 1,000 mcg, Oral, Daily, Gonfa, Taye T, MD, 1,000 mcg at 11/11/22 0847   dextrose 5 %-0.9 % sodium chloride infusion, , Intravenous, Continuous, Gonfa, Taye T, MD, Last Rate: 75 mL/hr at 11/11/22 0331, New Bag at 11/11/22 0331   multivitamin with minerals tablet 1 tablet, 1 tablet, Oral, Daily, Madelyn Flavors A, MD, 1 tablet at 11/11/22 0847   polyvinyl alcohol (LIQUIFILM TEARS) 1.4 % ophthalmic solution  1 drop, 1 drop, Both Eyes, PRN, Katrinka Blazing, Rondell A, MD   thiamine (VITAMIN B1) tablet 100 mg, 100 mg, Oral, Daily, 100 mg at 11/11/22 0847 **OR** thiamine (VITAMIN B1) injection 100 mg, 100 mg, Intravenous, Daily, Clydie Braun, MD   Patients Current Diet:  Diet Order                  Diet Heart Room service appropriate? Yes with Assist; Fluid consistency: Thin  Diet effective now                         Precautions / Restrictions Precautions Precautions: Fall Restrictions Weight Bearing Restrictions: No    Has the patient had 2 or more falls or a fall with injury in the past year? No   Prior Activity Level Limited Community (1-2x/wk): indep with mobility, driving, no DME   Prior Functional Level Self Care: Did the patient need help bathing, dressing, using the toilet or eating? Independent   Indoor Mobility: Did the patient need assistance with walking from room to room (with or without device)? Independent   Stairs: Did the patient need assistance with internal or external stairs (with or without device)? Independent   Functional Cognition: Did the patient need help planning regular tasks such as shopping or remembering to take medications? Independent   Patient Information Are you of Hispanic, Latino/a,or Spanish origin?: A. No, not of Hispanic, Latino/a, or Spanish origin What is your race?: A. White Do you need or want an interpreter to communicate with a doctor or health care staff?: 0. No   Patient's Response To:  Health Literacy and Transportation Is the patient able to respond to health literacy and transportation needs?: Yes Health Literacy - How often do you need to have someone help you when you read instructions, pamphlets, or other written material from your doctor or pharmacy?: Never In the past 12 months, has lack of transportation kept you from medical appointments or from getting medications?: No In the past 12 months, has lack of transportation kept  you from meetings, work, or from getting things needed for daily living?: No   Journalist, newspaper / Equipment Home Assistive Devices/Equipment: None Home Equipment: Shower seat - built in, Grab bars - tub/shower   Prior Device Use: Indicate devices/aids used by the patient prior to current illness, exacerbation or injury? None of  the above   Current Functional Level Cognition   Overall Cognitive Status: Impaired/Different from baseline Current Attention Level: Focused Orientation Level: Oriented to person, Oriented to place, Oriented to time Following Commands: Follows one step commands with increased time, Follows one step commands consistently Safety/Judgement: Decreased awareness of safety, Decreased awareness of deficits General Comments: pt with improved alertness and engaging in therapy session. pt answering questions appropriately with 1-3 word responses and extra time to process. pt showing thumbs up or down to relay if feeling comfortable/good. pt able to keep Rt eye ~50% open throughout session, Lt eye remains mostly closed. pt showing no signs of discomfort throughout session. wife present at bedside and pt able to recognize and name her. Lt neglect/inattention.    Extremity Assessment (includes Sensation/Coordination)   Upper Extremity Assessment: LUE deficits/detail LUE Deficits / Details: PROM WNL, 1/5 twitch noted at thumb when asked pt to move his left arm--no other movement seen, nor was this replicated LUE Coordination: decreased fine motor, decreased gross motor  Lower Extremity Assessment: LLE deficits/detail, RLE deficits/detail RLE Deficits / Details: rhythmically dorsiflexes and plantarflexes the ankle intermittently (more consistently on L than R); able to lift R leg against gravity LLE Deficits / Details: rhythmically dorsiflexes and plantarflexes the ankle intermittently (more consistently on L than R); unable to actively lift leg against gravity when cued;  spasticity noted in hamstrings with Modified Ashworth Scale score of 1+; withdraws to painful stimuli at calf LLE Coordination: decreased fine motor, decreased gross motor     ADLs   Overall ADL's : Needs assistance/impaired General ADL Comments: total A for all basic ADLs at bed level or supported sitting in recliner     Mobility   Overal bed mobility: Needs Assistance Bed Mobility: Rolling, Supine to Sit Rolling: Mod assist, Max assist Supine to sit: Total assist, HOB elevated Sit to supine: Total assist, HOB elevated General bed mobility comments: pt able to flex Rt LE and punch Rt UE upwards and contralaterally towards bed rail for Lt rolling. Max asisst to complete to Lt and Max +2 to roll Rt. pt had BM and total assist for pericare. pt able to bring Rt LE off EOB with verbal cues. Max assist to bring Lt LE off and press trunk upright to sit EOB.     Transfers   Overall transfer level: Needs assistance Equipment used: 2 person hand held assist Transfers: Sit to/from Stand, Bed to chair/wheelchair/BSC Sit to Stand: Max assist, +2 safety/equipment Bed to/from chair/wheelchair/BSC transfer type:: Via Lift equipment Stand pivot transfers: +2 safety/equipment, Max assist Transfer via Lift Equipment: Stedy General transfer comment: Stedy positioned for sit<>stand to trial for bed>chair transfer. Pt able to use Rt UE and LE to power up. manual blocking to prevent Lt lean and support Lt UE. mod-max+2 for stand and to bring paddles down for seated position on Stedy. dependent transfer bed>chair. Pt required increased assist to rise from stedy paddles and control lower to chair.     Ambulation / Gait / Stairs / Psychologist, prison and probation services   Ambulation/Gait General Gait Details: unable     Posture / Balance Dynamic Sitting Balance Sitting balance - Comments: Posterior and L lateral lean noted with no reactional strategies during LOB, needing mod-maxA consistently for static sitting  balance Balance Overall balance assessment: Needs assistance Sitting-balance support: Single extremity supported, No upper extremity supported, Feet supported Sitting balance-Leahy Scale: Poor Sitting balance - Comments: Posterior and L lateral lean noted with no reactional strategies during LOB, needing mod-maxA  consistently for static sitting balance Postural control: Posterior lean, Left lateral lean Standing balance support: Single extremity supported, During functional activity Standing balance-Leahy Scale: Poor Standing balance comment: Pt tends to lean to the L and flex at the hips and L knee in standing, L knee blocked and R UE support with maxA to stand 2x ~10-15 sec each     Special needs/care consideration N/a    Previous Home Environment (from acute therapy documentation) Living Arrangements: Spouse/significant other Available Help at Discharge: Family, Available 24 hours/day Type of Home: House Home Layout: One level Home Access: Stairs to enter Entrance Stairs-Rails: None Entrance Stairs-Number of Steps: 1 Bathroom Shower/Tub: Health visitor: Standard Home Care Services: No   Discharge Living Setting Plans for Discharge Living Setting: Patient's home, Lives with (comment) (spouse) Type of Home at Discharge: House Discharge Home Layout: One level Discharge Home Access: Stairs to enter Entrance Stairs-Rails: None Entrance Stairs-Number of Steps: 1 Discharge Bathroom Shower/Tub: Walk-in shower Discharge Bathroom Toilet: Standard Discharge Bathroom Accessibility: Yes How Accessible: Accessible via walker Does the patient have any problems obtaining your medications?: No   Social/Family/Support Systems Patient Roles: Spouse Anticipated Caregiver: spouse, Aurea Graff Anticipated Industrial/product designer Information: [phone] Ability/Limitations of Caregiver: min assist mobility/ADLs Caregiver Availability: 24/7 Discharge Plan Discussed with Primary  Caregiver: Yes Is Caregiver In Agreement with Plan?: Yes Does Caregiver/Family have Issues with Lodging/Transportation while Pt is in Rehab?: No   Goals Patient/Family Goal for Rehab: PT/OT min assist w/c level, SLP supervision Expected length of stay: 24-28 days Additional Information: Discharge plan: home with spouse, can provide min assist, likely w/c level Pt/Family Agrees to Admission and willing to participate: Yes Program Orientation Provided & Reviewed with Pt/Caregiver Including Roles  & Responsibilities: Yes  Barriers to Discharge: Insurance for SNF coverage, Home environment access/layout   Decrease burden of Care through IP rehab admission: n/a   Possible need for SNF placement upon discharge: Not anticipated.  Plan home with wife, min assist level, likely w/c.    Patient Condition: I have reviewed medical records from [facility], spoken with  [facility] team , and patient and spouse. I met with patient at the bedside for inpatient rehabilitation assessment.  Patient will benefit from ongoing PT, OT, and SLP, can actively participate in 3 hours of therapy a day 5 days of the week, and can make measurable gains during the admission.  Patient will also benefit from the coordinated team approach during an Inpatient Acute Rehabilitation admission.  The patient will receive intensive therapy as well as Rehabilitation physician, nursing, social worker, and care management interventions.  Due to safety, skin/wound care, disease management, medication administration, pain management, and patient education the patient requires 24 hour a day rehabilitation nursing.  The patient is currently max assist 1-2 with mobility and basic ADLs.  Discharge setting and therapy post discharge at home with home health is anticipated.  Patient has agreed to participate in the Acute Inpatient Rehabilitation Program and will admit today.   Preadmission Screen Completed By:  Stephania Fragmin, PT, DPT 11/11/2022 2:48 PM,  with updates provided by Lissa Merlin, PT 11/14/22 ______________________________________________________________________   Discussed status with Dr. Riley Kill on 11/14/22 at 9:30am and received approval for admission today.   Admission Coordinator:  Stephania Fragmin, PT, DTaP, with updates provided by Lissa Merlin, PT at time 9:30am/Date 11/14/22    Assessment/Plan: Diagnosis: Right ACA infarct Does the need for close, 24 hr/day Medical supervision in concert with the patient's rehab needs make it  unreasonable for this patient to be served in a less intensive setting? Yes Co-Morbidities requiring supervision/potential complications: cervical myelopathy, HTN, anxiety Due to bladder management, bowel management, safety, skin/wound care, disease management, medication administration, pain management, and patient education, does the patient require 24 hr/day rehab nursing? Yes Does the patient require coordinated care of a physician, rehab nurse, PT, OT, and SLP to address physical and functional deficits in the context of the above medical diagnosis(es)? Yes Addressing deficits in the following areas: balance, endurance, locomotion, strength, transferring, bowel/bladder control, bathing, dressing, feeding, grooming, toileting, cognition, and psychosocial support Can the patient actively participate in an intensive therapy program of at least 3 hrs of therapy 5 days a week? Yes The potential for patient to make measurable gains while on inpatient rehab is excellent Anticipated functional outcomes upon discharge from inpatient rehab: min assist PT, min assist OT, supervision SLP Estimated rehab length of stay to reach the above functional goals is: 24-28 days Anticipated discharge destination: Home 10. Overall Rehab/Functional Prognosis: excellent     MD Signature: Ranelle Oyster, MD, [facility] Physical Medicine & Rehabilitation Medical Director Rehabilitation Services 11/14/2022

## 2022-11-14 NOTE — TOC Transition Note (Signed)
Transition of Care [facility]) - CM/SW Discharge Note   Patient Details  Name: SAMYAR KULLA MRN: [id number] Date of Birth: 12/26/1945  Transition of [facility]) CM/SW Contact:  Kermit Balo, RN Phone Number: 11/14/2022, 11:46 AM   Clinical Narrative:    Pt is discharging to CIR today. No further needs per TOC.   Final next level of care: IP Rehab Facility Barriers to Discharge: No Barriers Identified   Patient Goals and CMS Choice CMS Medicare.gov Compare Post Acute Care list provided to:: Patient Represenative (must comment) Choice offered to / list presented to : Spouse  Discharge Placement                         Discharge Plan and Services Additional resources added to the After Visit Summary for     Discharge Planning Services: CM Consult Post Acute Care Choice: IP Rehab                               Social Determinants of Health (SDOH) Interventions SDOH Screenings   Food Insecurity: No Food Insecurity (11/08/2022)  Housing: Low Risk  (11/08/2022)  Transportation Needs: No Transportation Needs (11/08/2022)  Utilities: Not At Risk (11/08/2022)  Depression (PHQ2-9): Low Risk  (01/18/2022)  Tobacco Use: Low Risk  (11/08/2022)     Readmission Risk Interventions     No data to display

## 2022-11-14 NOTE — Progress Notes (Signed)
Patient off the unit for Loop recorder placement

## 2022-11-15 ENCOUNTER — Inpatient Hospital Stay (HOSPITAL_COMMUNITY): Payer: Medicare Other

## 2022-11-15 ENCOUNTER — Encounter (HOSPITAL_COMMUNITY): Payer: Self-pay | Admitting: Cardiovascular Disease

## 2022-11-15 DIAGNOSIS — M7989 Other specified soft tissue disorders: Secondary | ICD-10-CM | POA: Diagnosis not present

## 2022-11-15 DIAGNOSIS — I63521 Cerebral infarction due to unspecified occlusion or stenosis of right anterior cerebral artery: Secondary | ICD-10-CM | POA: Diagnosis not present

## 2022-11-15 LAB — CBC WITH DIFFERENTIAL/PLATELET
Abs Immature Granulocytes: 0.03 10*3/uL (ref 0.00–0.07)
Basophils Absolute: 0 10*3/uL (ref 0.0–0.1)
Basophils Relative: 0 %
Eosinophils Absolute: 0.4 10*3/uL (ref 0.0–0.5)
Eosinophils Relative: 5 %
HCT: 35.9 % — ABNORMAL LOW (ref 39.0–52.0)
Hemoglobin: 12.2 g/dL — ABNORMAL LOW (ref 13.0–17.0)
Immature Granulocytes: 0 %
Lymphocytes Relative: 11 %
Lymphs Abs: 0.9 10*3/uL (ref 0.7–4.0)
MCH: 32.1 pg (ref 26.0–34.0)
MCHC: 34 g/dL (ref 30.0–36.0)
MCV: 94.5 fL (ref 80.0–100.0)
Monocytes Absolute: 1.1 10*3/uL — ABNORMAL HIGH (ref 0.1–1.0)
Monocytes Relative: 14 %
Neutro Abs: 5.3 10*3/uL (ref 1.7–7.7)
Neutrophils Relative %: 70 %
Platelets: 214 10*3/uL (ref 150–400)
RBC: 3.8 MIL/uL — ABNORMAL LOW (ref 4.22–5.81)
RDW: 12.6 % (ref 11.5–15.5)
WBC: 7.7 10*3/uL (ref 4.0–10.5)
nRBC: 0 % (ref 0.0–0.2)

## 2022-11-15 LAB — COMPREHENSIVE METABOLIC PANEL
ALT: 26 U/L (ref 0–44)
AST: 22 U/L (ref 15–41)
Albumin: 2.9 g/dL — ABNORMAL LOW (ref 3.5–5.0)
Alkaline Phosphatase: 49 U/L (ref 38–126)
Anion gap: 8 (ref 5–15)
BUN: 11 mg/dL (ref 8–23)
CO2: 22 mmol/L (ref 22–32)
Calcium: 8.1 mg/dL — ABNORMAL LOW (ref 8.9–10.3)
Chloride: 105 mmol/L (ref 98–111)
Creatinine, Ser: 0.68 mg/dL (ref 0.61–1.24)
GFR, Estimated: >60 mL/min (ref >60)
Glucose, Bld: 116 mg/dL — ABNORMAL HIGH (ref 70–99)
Potassium: 3.5 mmol/L (ref 3.5–5.1)
Sodium: 135 mmol/L (ref 135–145)
Total Bilirubin: 0.8 mg/dL (ref 0.3–1.2)
Total Protein: 5.6 g/dL — ABNORMAL LOW (ref 6.5–8.1)

## 2022-11-15 MED ORDER — APIXABAN 5 MG PO TABS: 5.0000 mg | ORAL_TABLET | Freq: Two times a day (BID) | ORAL | Status: DC

## 2022-11-15 MED ORDER — APIXABAN 5 MG PO TABS
10.0000 mg | ORAL_TABLET | Freq: Two times a day (BID) | ORAL | Status: DC
  Administered 2022-11-15 – 2022-11-16 (×2): 10 mg via ORAL
  Filled 2022-11-15 (×2): qty 2

## 2022-11-15 MED ORDER — FOLIC ACID 1 MG PO TABS
1.0000 mg | ORAL_TABLET | Freq: Every day | ORAL | Status: DC
  Administered 2022-11-15 – 2022-12-09 (×25): 1 mg via ORAL
  Filled 2022-11-15 (×25): qty 1

## 2022-11-15 NOTE — Progress Notes (Signed)
Patient ID: Samuel Pearson, male   DOB: 15-Jul-1945, 77 y.o.   MRN: [id number] Met with the patient to review current situation, rehab process, team conference and plan of care. Patient with flat affect and delayed but appropriate response. Reviewed loop recorder receiver care and rationale. Discussed medications for secondary risk management including HTN, HLD and hypocalcemia. Patient reported he drank a 12oz beer daily PTA; AIC 5.7. Remains incontinent of bowel and bladder; reviewed need to communicate needs and call bell in reach. Continue to follow along to address educational needs to facilitate preparation for discharge home with spouse. Pamelia Hoit

## 2022-11-15 NOTE — Progress Notes (Signed)
Notified of left calf DVT:   Lower Venous DVT Study  Patient Name:  Samuel Pearson  Date of Exam:   11/15/2022 Medical Rec #: [id number]         Accession #:    [id number] Date of Birth: Jul 06, 1945         Patient Gender: M Patient Age:   77 years Exam Location:  [facility] Procedure:      VAS [place] LOWER EXTREMITY VENOUS (DVT) Referring Phys: Faith Rogue   --------------------------------------------------------------------------- -----   Indications: Edema (rehab patient - CVA).   Performing Technologist: Ernestene Mention RVT, RDMS    Examination Guidelines: A complete evaluation includes B-mode imaging, spectral Doppler, color Doppler, and power Doppler as needed of all accessible portions of each vessel. Bilateral testing is considered an integral part of a complete examination. Limited examinations for reoccurring indications may be performed as noted. The reflux portion of the exam is performed with the patient in reverse Trendelenburg.     +---------+---------------+---------+-----------+----------+--------------+  RIGHT    CompressibilityPhasicitySpontaneityPropertiesThrombus Aging +---------+---------------+---------+-----------+----------+--------------+  CFV      Full           Yes      Yes                                  +---------+---------------+---------+-----------+----------+--------------+  SFJ      Full                                                         +---------+---------------+---------+-----------+----------+--------------+  FV Prox  Full           Yes      Yes                                  +---------+---------------+---------+-----------+----------+--------------+  FV Mid   Full           Yes      Yes                                  +---------+---------------+---------+-----------+----------+--------------+  FV DistalFull           Yes      Yes                                   +---------+---------------+---------+-----------+----------+--------------+  PFV      Full                                                         +---------+---------------+---------+-----------+----------+--------------+  POP      Full           Yes      Yes                                  +---------+---------------+---------+-----------+----------+--------------+  PTV  Full                                                         +---------+---------------+---------+-----------+----------+--------------+  PERO     Full                                                         +---------+---------------+---------+-----------+----------+--------------+         +---------+---------------+---------+-----------+----------+--------------+  LEFT     CompressibilityPhasicitySpontaneityPropertiesThrombus Aging +---------+---------------+---------+-----------+----------+--------------+  CFV      Full                                                         +---------+---------------+---------+-----------+----------+--------------+  SFJ      Full                                                         +---------+---------------+---------+-----------+----------+--------------+  FV Prox  Full                                                         +---------+---------------+---------+-----------+----------+--------------+  FV Mid   Full                                                         +---------+---------------+---------+-----------+----------+--------------+  FV DistalFull                                                         +---------+---------------+---------+-----------+----------+--------------+  PFV      Full                                                         +---------+---------------+---------+-----------+----------+--------------+  POP      Full                                                          +---------+---------------+---------+-----------+----------+--------------+  PTV      Full                                                         +---------+---------------+---------+-----------+----------+--------------+  PERO     None           No       No                   Acute          +---------+---------------+---------+-----------+----------+--------------+  Gastroc  None           No       No                   Acute          +---------+---------------+---------+-----------+----------+--------------+    Summary: BILATERAL: -No evidence of popliteal cyst, bilaterally. RIGHT: - There is no evidence of deep vein thrombosis in the lower extremity.   LEFT: - Findings consistent with acute deep vein thrombosis involving the left peroneal veins, and left gastrocnemius veins.    *See table(s) above for measurements and observations.    Preliminary    CBC    Component Value Date/Time   WBC 7.7 11/15/2022 0546   RBC 3.80 (L) 11/15/2022 0546   HGB 12.2 (L) 11/15/2022 0546   HCT 35.9 (L) 11/15/2022 0546   PLT 214 11/15/2022 0546   MCV 94.5 11/15/2022 0546   MCH 32.1 11/15/2022 0546   MCHC 34.0 11/15/2022 0546   RDW 12.6 11/15/2022 0546   LYMPHSABS 0.9 11/15/2022 0546   MONOABS 1.1 (H) 11/15/2022 0546   EOSABS 0.4 11/15/2022 0546   BASOSABS 0.0 11/15/2022 0546   Will initiate pharmacy consultation for Eliquis.

## 2022-11-15 NOTE — Progress Notes (Signed)
Inpatient Rehabilitation Center Individual Statement of Services  Patient Name:  Samuel Pearson  Date:  11/15/2022  Welcome to the Inpatient Rehabilitation Center.  Our goal is to provide you with an individualized program based on your diagnosis and situation, designed to meet your specific needs.  With this comprehensive rehabilitation program, you will be expected to participate in at least 3 hours of rehabilitation therapies Monday-Friday, with modified therapy programming on the weekends.  Your rehabilitation program will include the following services:  Physical Therapy (PT), Occupational Therapy (OT), Speech Therapy (ST), 24 hour per day rehabilitation nursing, Therapeutic Recreaction (TR), Neuropsychology, Care Coordinator, Rehabilitation Medicine, Nutrition Services, Pharmacy Services, and Other  Weekly team conferences will be held on Wednesdays to discuss your progress.  Your Inpatient Rehabilitation Care Coordinator will talk with you frequently to get your input and to update you on team discussions.  Team conferences with you and your family in attendance may also be held.  Expected length of stay: 24-28 Days  Overall anticipated outcome: PT/OT min assist w/c level, SLP supervision   Depending on your progress and recovery, your program may change. Your Inpatient Rehabilitation Care Coordinator will coordinate services and will keep you informed of any changes. Your Inpatient Rehabilitation Care Coordinator's name and contact numbers are listed  below.  The following services may also be recommended but are not provided by the Inpatient Rehabilitation Center:   Home Health Rehabiltiation Services Outpatient Rehabilitation Services    Arrangements will be made to provide these services after discharge if needed.  Arrangements include referral to agencies that provide these services.  Your insurance has been verified to be:  [facility] MEDICARE Your primary doctor is:  Berniece Andreas,  MD  Pertinent information will be shared with your doctor and your insurance company.  Inpatient Rehabilitation Care Coordinator:  Lavera Guise, [place] [phone] or [phone]  Information discussed with and copy given to patient by: Andria Rhein, 11/15/2022, 9:39 AM

## 2022-11-15 NOTE — Progress Notes (Signed)
BLE venous duplex has been completed.  Preliminary findings given to [facility], Charity fundraiser.    Results can be found under chart review under CV PROC. 11/15/2022 5:49 PM Adelaide Pfefferkorn RVT, RDMS

## 2022-11-15 NOTE — Evaluation (Signed)
Speech Language Pathology Assessment and Plan  Patient Details  Name: Samuel Pearson MRN: [id number] Date of Birth: 1946-02-03  SLP Diagnosis: Cognitive Impairments  Rehab Potential: Good ELOS: 24-28 days    Today's Date: 11/15/2022 SLP Individual Time: 0900-1000 SLP Individual Time Calculation (min): 60 min   Hospital Problem: Principal Problem:   CVA (cerebral vascular accident) [facility])  Past Medical History:  Past Medical History:  Diagnosis Date   Allergic rhinitis    Anxiety    Detached retina    Heart murmur    has had one when a child and checked out ok  never? 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  elevated MCV  No other sx    Past Surgical History:  Past Surgical History:  Procedure Laterality Date   CATARACT EXTRACTION  2009   [facility]   CHOLECYSTECTOMY N/A 02/25/2020   Procedure: LAPAROSCOPIC CHOLECYSTECTOMY;  Surgeon: Harriette Bouillon, MD;  Location: [facility] OR;  Service: General;  Laterality: N/A;   LOOP RECORDER INSERTION N/A 11/14/2022   Procedure: LOOP RECORDER INSERTION;  Surgeon: Maurice Small, MD;  Location: [facility] INVASIVE CV LAB;  Service: Cardiovascular;  Laterality: N/A;    Assessment / Plan / Recommendation Clinical Impression HPI: Samuel Pearson is a 77 year old male who persented to [facility] ED as code stroke on 11/08/2022. He awoke that morning and attempted to walk to the bathroom but fell because of left-sided weakness.  Was reassessed later in the afternoon and noted to have twitching/jerking movements in bilateral lower extremities left worse than right. 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 No hemorrhage. CTA of the head with intracranial atherosclerotic disease with multifocal stenoses,  most notably of a moderate degree of the left paraclinoid ICA. 2D echo with EF of 60 to 65% and left atrial dilation. Tolerating diet.   Clinical Impression:  Patient was administered the Cognistat standardized assessment to evaluate cognitive-linguistic function. Patient [facility] for naming and reasoning/executive functioning tasks. Patient with mild impairments in orientation, memory, problem solving, and repetition. During orientation tasks, patient oriented to place and person, however demonstrated difficulties in recalling time and medical situation indicating poor intellectual awareness skills. Patient able to recall 3/4 vocabulary words during delayed recall, however noted difficulty during repetition tasks likely due to memory rather than expression. Patient with severe impairments in visual construction as he was unable to complete task without max multimodal assist. Patient with delayed processing throughout the evaluation, often requiring up to 20 seconds to respond to questions. Furthermore, patient with poor attention skills and noted L neglect as he only turned his head to the left side when his wife was present. Patient would benefit from skilled ST intervention to maximize attention, problem solving, memory, awareness and initiation; therefore maximizing functional independence prior to d/c. Anticipate patient will require 24 hour supervision at home and f/u outpatient SLP services.    Skilled Therapeutic Interventions          Patient was administered a standardized assessment to evaluate cognitive-linguistic function. See above for details.  SLP Assessment  Patient will need skilled Speech Lanaguage Pathology Services during CIR admission    Recommendations  Patient destination: Home Follow up Recommendations: Outpatient SLP  Equipment Recommended: None recommended by SLP    SLP Frequency 1 to 3 out of 7 days   SLP Duration  SLP Intensity  SLP Treatment/Interventions 24-28  days  Minumum of 1-2 x/day, 30 to 90 minutes  Cognitive remediation/compensation;Internal/external aids;Speech/Language facilitation;Cueing hierarchy;Environmental controls;Therapeutic Activities;Functional tasks;Patient/family education;Therapeutic Exercise    Pain Pain Assessment Pain Scale: 0-10 Pain Score: 0-No pain  SLP Evaluation Cognition Overall Cognitive Status: Impaired/Different from baseline Arousal/Alertness: Awake/alert Orientation Level: Oriented to person;Oriented to place;Disoriented to time;Disoriented to situation Year: 2024 Month: July Day of Week: Incorrect Attention: Sustained;Selective;Focused Focused Attention: Impaired Focused Attention Impairment: Verbal basic;Functional basic Sustained Attention: Impaired Sustained Attention Impairment: Verbal basic;Functional basic Selective Attention: Impaired Selective Attention Impairment: Verbal basic;Functional basic Memory: Impaired Memory Impairment: Retrieval deficit;Decreased short term memory Decreased Short Term Memory: Verbal basic;Functional basic Awareness: Impaired Awareness Impairment: Intellectual impairment Problem Solving: Impaired Problem Solving Impairment: Verbal basic;Functional basic Executive Function: Initiating Initiating: Impaired Initiating Impairment: Verbal basic;Functional basic Safety/Judgment: Impaired  Comprehension Auditory Comprehension Overall Auditory Comprehension: Appears within functional limits for tasks assessed Yes/No Questions: Within Functional Limits Conversation: Simple Interfering Components: Attention;Processing speed EffectiveTechniques: Extra processing time;Slowed speech;Pausing Expression Expression Primary Mode of Expression: Verbal Verbal Expression Overall Verbal Expression: Other (comment) (requires repetition and increased processing time) Initiation: Impaired Repetition: Impaired Level of Impairment:  (difficulty with numbers, no impairments  with phrases) Naming: No impairment Interfering Components: Attention Oral Motor Oral Motor/Sensory Function Overall Oral Motor/Sensory Function: Within functional limits Motor Speech Overall Motor Speech: Appears within functional limits for tasks assessed Respiration: Within functional limits Phonation: Normal Resonance: Within functional limits Articulation: Within functional limitis Intelligibility: Intelligible Motor Planning: Witnin functional limits  Care Tool Care Tool Cognition Ability to hear (with hearing aid or hearing appliances if normally used Ability to hear (with hearing aid or hearing appliances if normally used): 0. Adequate - no difficulty in normal conservation, social interaction, listening to TV   Expression of Ideas and Wants Expression of Ideas and Wants: 2. Frequent difficulty - frequently exhibits difficulty with expressing needs and ideas   Understanding Verbal and Non-Verbal Content Understanding Verbal and Non-Verbal Content: 3. Usually understands - understands most conversations, but misses some part/intent of message. Requires cues at times to understand  Memory/Recall Ability Memory/Recall Ability : Location of own room;That he or she is in a hospital/hospital unit    Short Term Goals: Week 1: SLP Short Term Goal 1 (Week 1): Patient will demonstrate orientation to time with mod multimodal cues SLP Short Term Goal 2 (Week 1): Patient will answer mildly complex problem solving questions with mod multimodal A SLP Short Term Goal 3 (Week 1): Patient will utilize memory compensatory strategies with mod multimodal A SLP Short Term Goal 4 (Week 1): Patient will demonstrate selective/sutained attention for >10 minutes with mod multimodal A SLP Short Term Goal 5 (Week 1): Patient will initiate verbal responses in 75% of opportunities with mod verbal cues SLP Short Term Goal 6 (Week 1): Patient will demonstrate intellectual awareness to deficits with mod  multimodal A  Refer to Care Plan for Long Term Goals  Recommendations for other services: None   Discharge Criteria: Patient will be discharged from SLP if patient refuses treatment 3 consecutive times without medical reason, if treatment goals not met, if there is a change in medical status, if patient makes no progress towards goals or if patient is discharged from hospital.  The above assessment, treatment plan, treatment alternatives and goals were discussed and mutually agreed upon: by patient  Robt Okuda M.A., CF-SLP 11/15/2022, 10:27 AM

## 2022-11-15 NOTE — Progress Notes (Signed)
Inpatient Rehabilitation Care Coordinator Assessment and Plan Patient Details  Name: Samuel Pearson MRN: [id number] Date of Birth: 09-19-1945  Today's Date: 11/15/2022  Hospital Problems: Principal Problem:   CVA (cerebral vascular accident) [facility])  Past Medical History:  Past Medical History:  Diagnosis Date   Allergic rhinitis    Anxiety    Detached retina    Heart murmur    has had one when a child and checked out ok  never? 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  elevated MCV  No other sx    Past Surgical History:  Past Surgical History:  Procedure Laterality Date   CATARACT EXTRACTION  2009   [facility]   CHOLECYSTECTOMY N/A 02/25/2020   Procedure: LAPAROSCOPIC CHOLECYSTECTOMY;  Surgeon: Harriette Bouillon, MD;  Location: [facility] OR;  Service: General;  Laterality: N/A;   LOOP RECORDER INSERTION N/A 11/14/2022   Procedure: LOOP RECORDER INSERTION;  Surgeon: Maurice Small, MD;  [facility]: [facility] INVASIVE CV LAB;  Service: Cardiovascular;  Laterality: N/A;   Social History:  reports that he has never smoked. He has never used smokeless tobacco. He reports current alcohol use of about 14.0 - 21.0 standard drinks of alcohol per week. He reports that he does not use drugs.  Family / Support Systems Marital Status: Married How Long?: 40+ Patient Roles: Spouse Spouse/Significant Other: Vincent Gros Children: n/a Other Supports: n/a Anticipated Caregiver: spouse, Aurea Graff Ability/Limitations of Caregiver: Min A Caregiver Availability: 24/7 Family Dynamics: support from spouse  Social History Preferred language: English Religion:  Cultural Background: Independent overall Education: [facility] Literacy - How often do you need to have someone help you when you read instructions, pamphlets, or other written material from your doctor or  pharmacy?: Never Writes: Yes Employment Status: Retired Marine scientist Issues: n/a Guardian/Conservator: n/a   Abuse/Neglect Abuse/Neglect Assessment Can Be Completed: Yes Physical Abuse: Denies Verbal Abuse: Denies Sexual Abuse: Denies Exploitation of patient/patient's resources: Denies Self-Neglect: Denies  Patient response to: Social Isolation - How often do you feel lonely or isolated from those around you?: Never  Emotional Status Pt's affect, behavior and adjustment status: Plesant. Met with patient and spouse at bedside. Recent Psychosocial Issues: coping Psychiatric History: hx of anxiety Substance Abuse History: n/a  Patient / Family Perceptions, Expectations & Goals Pt/Family understanding of illness & functional limitations: yes Premorbid pt/family roles/activities: independent overalll and driving Anticipated changes in roles/activities/participation: spouse reports she is able to assist up to Min A Pt/family expectations/goals: Sup/Min A  [employer]: None Premorbid Home Care/DME Agencies: None Transportation available at discharge: Spouse Is the patient able to respond to transportation needs?: Yes In the past 12 months, has lack of transportation kept you from medical appointments or from getting medications?: No In the past 12 months, has lack of transportation kept you from meetings, work, or from getting things needed for daily living?: No Resource referrals recommended: Neuropsychology  Discharge Planning Living Arrangements: Spouse/significant other Support Systems: Spouse/significant other Type of Residence: Private residence (1 level home, 1 step) Insurance Resources: Media planner (specify) ([employer] Medicare) Surveyor, quantity Resources: Family Support, Social Security Financial Screen Referred: No Living Expenses: Own Does the patient have any problems obtaining your medications?: No Home Management:  Indpendent Patient/Family Preliminary Plans: Spouse able to assist if needed Care Coordinator Barriers to  Discharge: Insurance for SNF coverage, Lack of/limited family support, Decreased caregiver support, Home environment access/layout Care Coordinator Anticipated Follow Up Needs: HH/OP Expected length of stay: 24-28 Days  Clinical Impression Sw met with patient and spouse at bedside. Introduced self and explained role. Patient discharging home with assistance from spouse able to assist 24/7 up to Min A. 1 level home, 1 step to enter. No additional questions or concerns currently. Sw will follow up with patient and spouse tomorrow.   Andria Rhein 11/15/2022, 1:36 PM

## 2022-11-15 NOTE — Progress Notes (Signed)
Inpatient Rehabilitation Admission Medication Review by a Pharmacist  A complete drug regimen review was completed for this patient to identify any potential clinically significant medication issues.  High Risk Drug Classes Is patient taking? Indication by Medication  Antipsychotic No   Anticoagulant Yes Enoxaparin - VTE prophylaxis  Antibiotic No   Opioid No   Antiplatelet Yes Aspirin, clopidogrel - CVA prophylaxis  Hypoglycemics/insulin No   Vasoactive Medication Yes Amlodipine - hypertension  Chemotherapy No   Other Yes Atorvastatin - hyperlipidemia Vitamin B-12, folic acid, thiamine, MVI - supplements Pantoprazole - GI prophylaxis Simethicone - gas retention Senna-docusate - laxative  PRNs: Acetaminophen - mild pain Maalox - indigestion Diphenhydramine - itching Guaifenesin/dextromethorphan - cough Methocarbamol - muscle spasms Ondansetron - nausea/vomiting Bisacodyl, miralax, fleets enema - constipation Trazodone - sleep Artificial Tears - dry eyes     Type of Medication Issue Identified Description of Issue Recommendation(s)  Drug Interaction(s) (clinically significant)     Duplicate Therapy     Allergy     No Medication Administration End Date  Aspirin and Clopidogrel planned for 3 weeks, then Clopidogrel alone. Aspirin stop date 12/05/22 as noted.  Incorrect Dose     Additional Drug Therapy Needed     Significant med changes from prior encounter (inform family/care partners about these prior to discharge). Metoprolol discontinued while inpatient.   DC summary indicates plan to resume Lisinopril. Communicate changes with patient/family at discharge.  Monitor blood pressure and resume Lisinopril if/when needed.  Other       Clinically significant medication issues were identified that warrant physician communication and completion of prescribed/recommended actions by midnight of the next day:  No  Pharmacist comments:   - Aspirin stop date 12/05/22.    Time spent performing this drug regimen review (minutes):  20   Dennie Fetters, [place] 11/15/2022 8:21 AM

## 2022-11-15 NOTE — Discharge Summary (Addendum)
Physician Discharge Summary  Patient ID: Samuel Pearson MRN: [id number] DOB/AGE: 1946/04/11 77 y.o.  Admit date: 11/14/2022 Discharge date: 12/09/2022  Discharge Diagnoses:  Principal Problem:   CVA (cerebral vascular accident) [facility]) Active problems: Functional deficits secondary to CVA Cervical stenosis Hypertension Hyperlipidemia Prediabetes Anemia Ileus Thyroid nodule DVT prophylaxis Mood stabilization  Discharged Condition: Stable  Significant Diagnostic Studies:  Lower Venous DVT Study   Patient Name:  Samuel Pearson  Date of Exam:   11/20/2022  Medical Rec #: [id number]         Accession #:    [id number]  Date of Birth: [birth year]         Patient Gender: M  Patient Age:   77 years  Exam Location:  [facility]  Procedure:      VAS [place] LOWER EXTREMITY VENOUS (DVT)  Referring Phys: Wendi Maya    ---------------------------------------------------------------------------  -----    Indications: F/U DVT found 11/15/2022 in the left gastroc and peroneal  veins.    Risk Factors: Immobility secondary to stroke, patient in rehab  Comparison Study: Prior bilateral LEV done 11/15/2022   Performing Technologist: Sherren Kerns RVS     Examination Guidelines:  A complete evaluation includes B-mode imaging, spectral Doppler, color  Doppler,  and power Doppler as needed of all accessible portions of each vessel.  Bilateral  testing is considered an integral part of a complete examination. Limited  examinations for reoccurring indications may be performed as noted. The  reflux  portion of the exam is performed with the patient in reverse  Trendelenburg.      +-------+---------------+---------+-----------+----------+-----------------  --+  RIGHT CompressibilityPhasicitySpontaneityPropertiesThrombus Aging        +-------+---------------+---------+-----------+----------+-----------------  --+  CFV   Full                                          pulsatile  waveforms  +-------+---------------+---------+-----------+----------+-----------------  --+  SFJ   Full                                                               +-------+---------------+---------+-----------+----------+-----------------  --+  FV ProxFull                                                               +-------+---------------+---------+-----------+----------+-----------------  --+  PFV   Full                                                               +-------+---------------+---------+-----------+----------+-----------------  --+         +---------+---------------+---------+-----------+----------+---------------  ----+  LEFT    CompressibilityPhasicitySpontaneityPropertiesThrombus Aging        +---------+---------------+---------+-----------+----------+---------------  ----+  CFV     Full  pulsatile  waveforms  +---------+---------------+---------+-----------+----------+---------------  ----+  SFJ     Full                                                               +---------+---------------+---------+-----------+----------+---------------  ----+  FV Prox  Full                                                               +---------+---------------+---------+-----------+----------+---------------  ----+  FV Mid   Full                                                               +---------+---------------+---------+-----------+----------+---------------  ----+  FV DistalFull                                         pulsatile  waveforms  +---------+---------------+---------+-----------+----------+---------------  ----+  PFV     Full                                                               +---------+---------------+---------+-----------+----------+---------------  ----+  POP     Full                                          pulsatile  waveforms  +---------+---------------+---------+-----------+----------+---------------  ----+  PTV     Full                                                               +---------+---------------+---------+-----------+----------+---------------  ----+  PERO    None                                         Acute                 +---------+---------------+---------+-----------+----------+---------------  ----+  Gastroc Full                                                               +---------+---------------+---------+-----------+----------+---------------  ----+  Summary:  RIGHT:  - No evidence of common femoral vein obstruction.  pulsatile waveforms    LEFT:  - Prior acute deep vein thrombosis involving the left peroneal veins,  remains.  - Findings appear improved from previous examination as the DVT in the  gastroc vein appears to have resolved. .  Pulsatile waveforms.     *See table(s) above for measurements and observations.   Electronically signed by Gerarda Fraction on 11/20/2022 at 12:04:57 PM.     Final       Lower Venous DVT Study  Patient Name:  Samuel Pearson  Date of Exam:   11/15/2022 Medical Rec #: [id number]         Accession #:    [id number] Date of Birth: [birth year]         Patient Gender: M Patient Age:   77 years Exam Location:  [facility] Procedure:      VAS [place] LOWER EXTREMITY VENOUS (DVT) Referring Phys: Faith Rogue   --------------------------------------------------------------------------- -----   Indications: Edema (rehab patient - CVA).   Performing Technologist: Ernestene Mention RVT, RDMS    Examination Guidelines: A complete evaluation includes B-mode imaging, spectral Doppler, color Doppler, and power Doppler as needed of all accessible portions of each vessel. Bilateral testing is considered an integral part of a complete examination. Limited examinations for reoccurring indications  may be performed as noted. The reflux portion of the exam is performed with the patient in reverse Trendelenburg.     +---------+---------------+---------+-----------+----------+--------------+  RIGHT    CompressibilityPhasicitySpontaneityPropertiesThrombus Aging +---------+---------------+---------+-----------+----------+--------------+  CFV      Full           Yes      Yes                                  +---------+---------------+---------+-----------+----------+--------------+  SFJ      Full                                                         +---------+---------------+---------+-----------+----------+--------------+  FV Prox  Full           Yes      Yes                                  +---------+---------------+---------+-----------+----------+--------------+  FV Mid   Full           Yes      Yes                                  +---------+---------------+---------+-----------+----------+--------------+  FV DistalFull           Yes      Yes                                  +---------+---------------+---------+-----------+----------+--------------+  PFV      Full                                                         +---------+---------------+---------+-----------+----------+--------------+  POP      Full           Yes      Yes                                  +---------+---------------+---------+-----------+----------+--------------+  PTV      Full                                                         +---------+---------------+---------+-----------+----------+--------------+  PERO     Full                                                         +---------+---------------+---------+-----------+----------+--------------+         +---------+---------------+---------+-----------+----------+--------------+  LEFT      CompressibilityPhasicitySpontaneityPropertiesThrombus Aging +---------+---------------+---------+-----------+----------+--------------+  CFV      Full                                                         +---------+---------------+---------+-----------+----------+--------------+  SFJ      Full                                                         +---------+---------------+---------+-----------+----------+--------------+  FV Prox  Full                                                         +---------+---------------+---------+-----------+----------+--------------+  FV Mid   Full                                                         +---------+---------------+---------+-----------+----------+--------------+  FV DistalFull                                                         +---------+---------------+---------+-----------+----------+--------------+  PFV      Full                                                         +---------+---------------+---------+-----------+----------+--------------+  POP      Full                                                         +---------+---------------+---------+-----------+----------+--------------+  PTV      Full                                                         +---------+---------------+---------+-----------+----------+--------------+  PERO     None           No       No                   Acute          +---------+---------------+---------+-----------+----------+--------------+  Gastroc  None           No       No                   Acute          +---------+---------------+---------+-----------+----------+--------------+  Summary: BILATERAL: -No evidence of popliteal cyst, bilaterally. RIGHT: - There is no evidence of deep vein thrombosis in the lower extremity.   LEFT: - Findings consistent with acute deep vein thrombosis involving  the left peroneal veins, and left gastrocnemius veins.    *See table(s) above for measurements and observations.  Electronically signed by Coral Else MD on 11/15/2022 at 9:56:11 PM.       Final     Labs:  Basic Metabolic Panel: Recent Labs  Lab 12/05/22 0611  NA 136  K 3.8  CL 105  CO2 24  GLUCOSE 120*  BUN 9  CREATININE 0.62  CALCIUM 8.5*    CBC: Recent Labs  Lab 12/05/22 0611  WBC 7.4  HGB 13.7  HCT 40.5  MCV 95.1  PLT 260    CBG: No results for input(s): "GLUCAP" in the last 168 hours.  Family history.  Mother with hypertension.  Father with hypertension.  Denies any colon cancer esophageal cancer or rectal cancer  Brief HPI:   Samuel Pearson is a 77 y.o. male who pers[facility] ED as code stroke on 11/08/2022. He awoke that morning and attempted to walk to the bathroom but fell because of left-sided weakness. His wife noted an episode of bowel incontinence. Allergy consulted and was outside of the window for tenecteplase. He was admitted to the hospitalist service. Was reassessed later in the afternoon and noted to have twitching/jerking movements in bilateral lower extremities left worse than right. CT of the head without acute findings. 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 No hemorrhage. MRI also performed of the complete spine but limited due to motion artifact. Pete CT head was performed as well as EEG. CTA of the head with intracranial atherosclerotic disease with multifocal stenoses, most notably of a moderate degree of the left paraclinoid ICA. 2D echo with EF of 60 to 65% and left atrial dilation. Hemoglobin A1c 5.7%. He was started on aspirin and Plavix for 3 weeks followed by Plavix alone. Tolerating diet. BP controlled. Neurosurgery, Dr. Conchita Paris consulted about cervical spinal stenosis and felt finding to be chronic and recommended  outpatient follow-up once he is off Plavix. Did not feel like there is utility for steroid either.    Hospital Course: Samuel Pearson was admitted to rehab 11/14/2022 for inpatient therapies to consist of PT, ST and OT at least three hours five  days a week. Past admission physiatrist, therapy team and rehab RN have worked together to provide customized collaborative inpatient rehab. Follow-up labs on 7/09 stable. Left calf DVT noted on 7/09 on screening venous duplex. Follow-up duplex ordered. More alert 7/15. Response time still delayed but mildly improved. Discussed with SLP no swallowing issues. Repeat venus duplex showed resolution of thrombus of left gastroc vein and remains in left peroneal veins. Ileus resolved 7/16. Diffuse skin rash noted on 7/18. Started Benadryl and prednisone. Rash persisted and Plavix held on 7/21 and B12 discontinued. Resumed Plavix 7/22. Walked 12 steps on 7/23. Steroids tapered.  Pertaining to patient's right ACA infarction remained stable and follow-up neurology services.  Mood stabilization with low-dose Ritalin.  Prediabetes hemoglobin A1c 5.7 blood sugars well-controlled.  Blood pressures were monitored on TID basis and amlodipine 5 mg daily continued. Elevated pressures and increased to 10 mg daily on 7/19.    Rehab course: During patient's stay in rehab weekly team conferences were held to monitor patient's progress, set goals and discuss barriers to discharge. At admission, patient required  total with mobility and with basic self-care skills.  He has had improvement in activity tolerance, balance, postural control as well as ability to compensate for deficits. He has had improvement in functional use RUE/LUE  and RLE/LLE as well as improvement in awareness.  Patient perform sit to stand transfers with max assist.  Provided verbal cues for patient to bring self towards midline orientation.  Total assist donning doffing briefs and moderate max assist to don hospital  pants.  Patient transition to sitting right side with patient needing cues to recall hooking bilateral lower extremities method and needed max assist to elevate trunk into sitting.  Worked on sitting edge of bed to don shirt when patient completing the task with right upper extremity.  Required overall max assist to don shirt.  Patient with slow progressive gains during rehab stay recommendations for skilled nursing facility       Disposition: SNF    Diet: carb modified  Special Instructions: No smoking or alcohol  Medications at discharge 1.  Tylenol as needed 2.  Norvasc 10 mg p.o. daily 3.  Protonix 40 mg daily 4.  Lipitor 40 mg p.o. daily 5.  Plavix 75 mg p.o. daily 6.  Folic acid 1 mg p.o. daily 7.  Lisinopril 10 mg p.o. daily 8.  Robaxin 500 mg every 6 hours as needed muscle spasms 9.  Ritalin 10 mg p.o. twice daily 10.  Multivitamin daily    30-35 minutes were spent completing discharge summary and discharge planning  Discharge Instructions     Ambulatory referral to Physical Medicine Rehab   Complete by: As directed    Follow-up 1 month CVA.  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    Contact information for follow-up providers     Panosh, Neta Mends, MD Follow up.   Specialties: Internal Medicine, Pediatrics Why: Call the office in 1-2 days to make arrangments for hospital follow-up appointment. Contact information: [address] [place] [address] [phone]         Erick Colace, MD Follow up.   Specialty: Physical Medicine and Rehabilitation Why: office will call  you to arrange your appt (sent) Contact information: [address] [place] [address] [phone]         [facility] Follow up.    Why: Call the office in 1-2 days to make arrangments for hospital follow-up appointment. Contact information: [address]     [address] [place] [address] [phone]        Lisbeth Renshaw, MD Follow up.   Specialty: Neurosurgery Why: Call the office in 1-2 days to make arrangments for hospital follow-up appointment. Contact information: [address] [place] [address] [phone]              Contact information for after-discharge care     Destination     [facility] Preferred SNF .   Service: Skilled Nursing Contact information: [address] [place] [address] [phone]                     Signed: Charlton Amor 12/09/2022, 8:13 AM

## 2022-11-15 NOTE — Plan of Care (Signed)
  Problem: RH Cognition - SLP Goal: RH LTG Patient will demonstrate orientation with cues Description:  LTG:  Patient will demonstrate orientation to person/place/time/situation with cues (SLP)   Flowsheets (Taken 11/15/2022 1015) LTG Patient will demonstrate orientation to: Time LTG: Patient will demonstrate orientation using cueing (SLP): Modified Independent   Problem: RH Problem Solving Goal: LTG Patient will demonstrate problem solving for (SLP) Description: LTG:  Patient will demonstrate problem solving for basic/complex daily situations with cues  (SLP) Flowsheets (Taken 11/15/2022 1015) LTG: Patient will demonstrate problem solving for (SLP): (mildly complex) -- LTG Patient will demonstrate problem solving for: Supervision   Problem: RH Memory Goal: LTG Patient will use memory compensatory aids to (SLP) Description: LTG:  Patient will use memory compensatory aids to recall biographical/new, daily complex information with cues (SLP) Flowsheets (Taken 11/15/2022 1015) LTG: Patient will use memory compensatory aids to (SLP): Supervision   Problem: RH Attention Goal: LTG Patient will demonstrate this level of attention during functional activites (SLP) Description: LTG:  Patient will will demonstrate this level of attention during functional activites (SLP) Flowsheets (Taken 11/15/2022 1015) Patient will demonstrate during cognitive/linguistic activities the attention type of:  Sustained  Selective LTG: Patient will demonstrate this level of attention during cognitive/linguistic activities with assistance of (SLP): Supervision Number of minutes patient will demonstrate attention during cognitive/linguistic activities: >30   Problem: RH Awareness Goal: LTG: Patient will demonstrate awareness during functional activites type of (SLP) Description: LTG: Patient will demonstrate awareness during functional activites type of (SLP) Flowsheets (Taken 11/15/2022 1019) LTG: Patient will demonstrate  awareness during cognitive/linguistic activities with assistance of (SLP): Supervision

## 2022-11-15 NOTE — Progress Notes (Signed)
ANTICOAGULATION CONSULT NOTE - Initial Consult  Pharmacy Consult for Eliquis Indication: DVT  Allergies  Allergen Reactions   Cinnamon Other (See Comments)    intolerance   Other Other (See Comments)    All kind of nuts except peanuts - intolerance   Pollen Extract-Tree Extract [Pollen Extract]     Patient Measurements:   Heparin Dosing Weight:   Vital Signs: Temp: 98.7 F (37.1 C) (07/09 1333) Temp Source: Oral (07/09 1333) BP: 162/54 (07/09 1333) Pulse Rate: 75 (07/09 1333)  Labs: Recent Labs    11/13/22 0525 11/15/22 0546  HGB 11.5* 12.2*  HCT 33.6* 35.9*  PLT 176 214  CREATININE 0.69 0.68    Estimated Creatinine Clearance: 68.7 mL/min (by C-G formula based on SCr of 0.68 mg/dL).   Medical History: Past Medical History:  Diagnosis Date   Allergic rhinitis    Anxiety    Detached retina    Heart murmur    has had one when a child and checked out ok  never? 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  elevated MCV  No other sx     Medications:  Scheduled:   amLODipine  5 mg Oral Daily   aspirin EC  81 mg Oral Daily   atorvastatin  40 mg Oral Daily   clopidogrel  75 mg Oral Daily   vitamin B-12  1,000 mcg Oral Daily   folic acid  1 mg Oral Daily   multivitamin with minerals  1 tablet Oral Daily   pantoprazole  40 mg Oral Daily   senna-docusate  1 tablet Oral QHS   simethicone  80 mg Oral QID   thiamine  100 mg Oral Daily   Or   thiamine  100 mg Intravenous Daily   Infusions:  PRN: acetaminophen, alum & mag hydroxide-simeth, bisacodyl, diphenhydrAMINE, guaiFENesin-dextromethorphan, methocarbamol, ondansetron **OR** ondansetron (ZOFRAN) IV, polyethylene glycol, polyvinyl alcohol, sodium phosphate, traZODone  Assessment: 77 yo s/p R ACA infarct currently on aspirin and plavix x 3 weeks, then plan for plavix alone  thereafter. Patient now found to have a LLE DVT and pharmacy consulted to dose Eliquis.  Goal of Therapy:  Therapeutic anticoagulation   Plan:  Eliquis 10mg  PO bid x 7 days, then 5mg  PO bid thereafter Will provide DOAC education prior to discharge Per discussion with Dr. Roda Shutters from Neurology, will discontinue aspirin/plavix while on Eliquis. Future antiplatelet plans to be determined based on duration of Eliquis therapy.  Loralee Pacas, PharmD, BCPS 11/15/2022,5:50 PM  Please check AMION for all [facility] phone numbers After 10:00 PM, call Main Pharmacy [phone]

## 2022-11-15 NOTE — Plan of Care (Signed)
Problem: RH Balance Goal: LTG: Patient will maintain dynamic sitting balance (OT) Description: LTG:  Patient will maintain dynamic sitting balance with assistance during activities of daily living (OT) Flowsheets (Taken 11/15/2022 1340) LTG: Pt will maintain dynamic sitting balance during ADLs with: Contact Guard/Touching assist Goal: LTG Patient will maintain dynamic standing with ADLs (OT) Description: LTG:  Patient will maintain dynamic standing balance with assist during activities of daily living (OT)  Flowsheets (Taken 11/15/2022 1340) LTG: Pt will maintain dynamic standing balance during ADLs with: Moderate Assistance - Patient 50 - 74%   Problem: Sit to Stand Goal: LTG:  Patient will perform sit to stand in prep for activites of daily living with assistance level (OT) Description: LTG:  Patient will perform sit to stand in prep for activites of daily living with assistance level (OT) Flowsheets (Taken 11/15/2022 1340) LTG: PT will perform sit to stand in prep for activites of daily living with assistance level: Minimal Assistance - Patient > 75%   Problem: RH Eating Goal: LTG Patient will perform eating w/assist, cues/equip (OT) Description: LTG: Patient will perform eating with assist, with/without cues using equipment (OT) Flowsheets (Taken 11/15/2022 1340) LTG: Pt will perform eating with assistance level of: Set up assist    Problem: RH Grooming Goal: LTG Patient will perform grooming w/assist,cues/equip (OT) Description: LTG: Patient will perform grooming with assist, with/without cues using equipment (OT) Flowsheets (Taken 11/15/2022 1340) LTG: Pt will perform grooming with assistance level of: Set up assist    Problem: RH Bathing Goal: LTG Patient will bathe all body parts with assist levels (OT) Description: LTG: Patient will bathe all body parts with assist levels (OT) Flowsheets (Taken 11/15/2022 1340) LTG: Pt will perform bathing with assistance level/cueing: Minimal  Assistance - Patient > 75%   Problem: RH Dressing Goal: LTG Patient will perform upper body dressing (OT) Description: LTG Patient will perform upper body dressing with assist, with/without cues (OT). Flowsheets (Taken 11/15/2022 1340) LTG: Pt will perform upper body dressing with assistance level of: Minimal Assistance - Patient > 75% Goal: LTG Patient will perform lower body dressing w/assist (OT) Description: LTG: Patient will perform lower body dressing with assist, with/without cues in positioning using equipment (OT) Flowsheets (Taken 11/15/2022 1340) LTG: Pt will perform lower body dressing with assistance level of: Moderate Assistance - Patient 50 - 74%   Problem: RH Toileting Goal: LTG Patient will perform toileting task (3/3 steps) with assistance level (OT) Description: LTG: Patient will perform toileting task (3/3 steps) with assistance level (OT)  Flowsheets (Taken 11/15/2022 1340) LTG: Pt will perform toileting task (3/3 steps) with assistance level: Moderate Assistance - Patient 50 - 74%   Problem: RH Functional Use of Upper Extremity Goal: LTG Patient will use RT/LT upper extremity as a (OT) Description: LTG: Patient will use right/left upper extremity as a stabilizer/gross assist/diminished/nondominant/dominant level with assist, with/without cues during functional activity (OT) Flowsheets (Taken 11/15/2022 1340) LTG: Use of upper extremity in functional activities: LUE as a stabilizer LTG: Pt will use upper extremity in functional activity with assistance level of: Minimal Assistance - Patient > 75%   Problem: RH Toilet Transfers Goal: LTG Patient will perform toilet transfers w/assist (OT) Description: LTG: Patient will perform toilet transfers with assist, with/without cues using equipment (OT) Flowsheets (Taken 11/15/2022 1340) LTG: Pt will perform toilet transfers with assistance level of: Moderate Assistance - Patient 50 - 74%   Problem: RH Tub/Shower Transfers Goal: LTG  Patient will perform tub/shower transfers w/assist (OT) Description: LTG: Patient will  perform tub/shower transfers with assist, with/without cues using equipment (OT) Flowsheets (Taken 11/15/2022 1340) LTG: Pt will perform tub/shower stall transfers with assistance level of: Moderate Assistance - Patient 50 - 74% LTG: Pt will perform tub/shower transfers from: Walk in shower   Problem: RH Memory Goal: LTG Patient will demonstrate ability for day to day recall/carry over during activities of daily living with assistance level (OT) Description: LTG:  Patient will demonstrate ability for day to day recall/carry over during activities of daily living with assistance level (OT). Flowsheets (Taken 11/15/2022 1340) LTG:  Patient will demonstrate ability for day to day recall/carry over during activities of daily living with assistance level (OT): Supervision   Problem: RH Awareness Goal: LTG: Patient will demonstrate awareness during functional activites type of (OT) Description: LTG: Patient will demonstrate awareness during functional activites type of (OT) Flowsheets (Taken 11/15/2022 1340) Patient will demonstrate awareness during functional activites type of: Intellectual LTG: Patient will demonstrate awareness during functional activites type of (OT): Supervision

## 2022-11-15 NOTE — Progress Notes (Addendum)
Called [employer] for patient's loop recorder. Per  representative, it shows the patient is waiting to be enrolled, states the clinic did not enroll the patient yet and there is a three part process. States would be beneficial to call the patient's clinic. Called  [facility] with Dr. Nelly Laurence. Spoke with nurse who states she needed to call the device clinic. Transferred by nurse and spoke with rep from device clinic who states they had to "click a button and device should work within 24 hours." States it should work by "late tonight or tomorrow", was told it should "kick in automatically" but if not we are to press and hold button and it will fill in with the color green. As of right now, it is flashing orange- was told this was to be expected until it is fully connected.

## 2022-11-15 NOTE — Progress Notes (Signed)
This nurse notified by imaging tech that patient has a DVT to left lower leg. States she will go back to the lab to type this up. Informed Dois Davenport PA.

## 2022-11-15 NOTE — Evaluation (Addendum)
Physical Therapy Assessment and Plan  Patient Details  Name: Samuel Pearson MRN: [id number] Date of Birth: 02/18/1946  PT Diagnosis: Difficulty walking, Hemiplegia non-dominant, Hypotonia, Impaired cognition, and Muscle weakness Rehab Potential: Fair ELOS: 4-5 weeks   Today's Date: 11/15/2022 PT Individual Time: 4098-1191 PT Individual Time Calculation (min): 84 min    Hospital Problem: Principal Problem:   CVA (cerebral vascular accident) (HCC)   Past Medical History:  Past Medical History:  Diagnosis Date   Allergic rhinitis    Anxiety    Detached retina    Heart murmur    has had one when a child and checked out ok  never? 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  elevated MCV  No other sx    Past Surgical History:  Past Surgical History:  Procedure Laterality Date   CATARACT EXTRACTION  2009   [facility]   CHOLECYSTECTOMY N/A 02/25/2020   Procedure: LAPAROSCOPIC CHOLECYSTECTOMY;  Surgeon: Harriette Bouillon, MD;  Location: [facility] OR;  Service: General;  Laterality: N/A;   LOOP RECORDER INSERTION N/A 11/14/2022   Procedure: LOOP RECORDER INSERTION;  Surgeon: Maurice Small, MD;  Location: [facility] INVASIVE CV LAB;  Service: Cardiovascular;  Laterality: N/A;    Assessment & Plan Clinical Impression: Patient is a 77 y.o. male who persented to [facility] ED as code stroke on 11/08/2022. He awoke that morning and attempted to walk to the bathroom but fell because of left-sided weakness. His wife noted an episode of bowel incontinence. Allergy consulted and was outside of the window for tenecteplase. He was admitted to the hospitalist service. Was reassessed later in the afternoon and noted to have twitching/jerking movements in bilateral lower extremities left worse than right. CT of the head without acute findings. MRI of the brain revealed acute  infarcts in the right ACA territory along the parasagittal right frontal cortex and in the right frontal centrum seminal ovale. No hemorrhage. MRI also performed of the complete spine but limited due to motion artifact. Pete CT head was performed as well as EEG. CTA of the head with intracranial atherosclerotic disease with multifocal stenoses, most notably of a moderate degree of the left paraclinoid ICA. 2D echo with EF of 60 to 65% and left atrial dilation. Hemoglobin A1c 5.7%. He was started on aspirin and Plavix for 3 weeks followed by Plavix alone. Tolerating diet. BP controlled. 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 Patient transferred to CIR on 11/14/2022 .   Patient currently requires total with mobility secondary to muscle paralysis, decreased cardiorespiratoy endurance, impaired timing and sequencing, abnormal tone, and decreased motor planning, decreased midline orientation and decreased attention to left, decreased initiation, decreased attention, decreased awareness, decreased problem solving, decreased safety awareness, and delayed processing, and decreased sitting balance, decreased standing balance, decreased postural control, hemiplegia, and decreased balance strategies.  Prior to hospitalization, patient was modified independent  with mobility and lived with Spouse in a House home.  Home access is 1Stairs to enter.  Patient will benefit from skilled PT intervention to maximize safe functional mobility, minimize fall risk, and decrease caregiver burden for planned discharge home with 24 hour assist.  Anticipate patient will benefit from follow up [facility] at discharge.  PT - End of Session Activity  Tolerance: Tolerates 30+ min activity with multiple rests Endurance Deficit: Yes PT Assessment Rehab Potential (ACUTE/IP ONLY): Fair PT Barriers to Discharge: Inaccessible home  environment;Decreased caregiver support;Home environment access/layout;Incontinence;Lack of/limited family support;Insurance for SNF coverage;Weight;Behavior PT Patient demonstrates impairments in the following area(s): Balance;Behavior;Edema;Endurance;Nutrition;Pain;Perception;Sensory;Motor PT Transfers Functional Problem(s): Bed Mobility;Bed to Chair;Car;Furniture;Floor PT Locomotion Functional Problem(s): Ambulation;Stairs PT Plan PT Intensity: Minimum of 1-2 x/day ,45 to 90 minutes PT Frequency: 5 out of 7 days PT Duration Estimated Length of Stay: 4-5 weeks PT Treatment/Interventions: Ambulation/gait training;Discharge planning;Functional mobility training;Psychosocial support;Therapeutic Activities;Visual/perceptual remediation/compensation;Balance/vestibular training;Disease management/prevention;Neuromuscular re-education;Skin care/wound management;Wheelchair propulsion/positioning;UE/LE Strength taining/ROM;Splinting/orthotics;DME/adaptive equipment instruction;Cognitive remediation/compensation;Community reintegration;Patient/family education;Stair training;UE/LE Coordination activities;Functional electrical stimulation PT Transfers Anticipated Outcome(s): ModA PT Locomotion Anticipated Outcome(s): ModA using LRAD PT Recommendation Recommendations for Other Services: Therapeutic Recreation consult Therapeutic Recreation Interventions: Stress management;Outing/community reintergration Follow Up Recommendations: Home health PT;Outpatient PT;24 hour supervision/assistance Patient destination: Home Equipment Recommended: To be determined   PT Evaluation Precautions/Restrictions Precautions Precautions: Fall Precaution Comments: dense L hemi with inability to initiate motor control with R hemibody Restrictions Weight Bearing Restrictions: No General   Vital SignsTherapy Vitals Temp: 98.2 F (36.8 C) Temp Source: Oral Pulse Rate: 73 Resp: 16 BP: (!) 176/52 Patient Position (if  appropriate): Lying Oxygen Therapy SpO2: 97 % O2 Device: Room Air Pain Pain Assessment Pain Scale: 0-10 Pain Score: 0-No pain Pain Interference Pain Interference Pain Effect on Sleep: 1. Rarely or not at all Pain Interference with Therapy Activities: 1. Rarely or not at all Pain Interference with Day-to-Day Activities: 1. Rarely or not at all Home Living/Prior Functioning Home Living Available Help at Discharge: Family;Available 24 hours/day Type of Home: House Home Access: Stairs to enter [employer] of Steps: 1 Entrance Stairs-Rails: None Home Layout: One level Bathroom Shower/Tub: Health visitor: Standard  Lives With: Spouse Prior Function Level of Independence: Independent with basic ADLs;Independent with gait;Independent with transfers;Independent with homemaking with ambulation  Able to Take Stairs?: Yes Driving: Yes Vocation: Retired Vision/Perception  Vision - History Ability to See in Adequate Light: 0 Adequate Vision - Assessment Additional Comments: R gaze preference with limited neck ROM to L, either bending or rotating Perception Perception: Impaired Inattention/Neglect: Does not attend to left visual field;Does not attend to left side of body Praxis Praxis: Impaired Praxis Impairment Details: Motor planning;Initiation  Cognition Orientation Level: Oriented to person;Oriented to place;Oriented to time Focused Attention: Impaired Sustained Attention: Impaired Selective Attention: Impaired Memory: Impaired Decreased Short Term Memory: Verbal basic;Functional basic Awareness: Impaired Problem Solving: Impaired Executive Function: Initiating Initiating: Impaired Safety/Judgment: Impaired Sensation Sensation Light Touch: Appears Intact Hot/Cold: Appears Intact Proprioception: Impaired by gross assessment Coordination Gross Motor Movements are Fluid and Coordinated: No Fine Motor Movements are Fluid and Coordinated:  No Coordination and Movement Description: unable to initiate L hemibody; slow to initiate, motor plan, or  with R hemibody Finger Nose Finger Test: dense hemiplegia in LUE/ LLE Heel Shin Test: unable with LLE, touches L ankle with RLE Motor  Motor Motor: Hemiplegia;Abnormal postural alignment and control Motor - Skilled Clinical Observations: unable to initiate L hemibody; slow to initiate, motor plan, or utiilize R hemibody   Trunk/Postural Assessment  Cervical Assessment Cervical Assessment: Exceptions to [facility] Cervical AROM Overall Cervical AROM: Deficits Overall Cervical AROM Comments: limited, 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 Thoracic Assessment Thoracic Assessment: Exceptions to [facility] (rounded shoulders with L>R,) Thoracic AROM Overall Thoracic AROM Comments: Limited at L shoulder Lumbar Assessment Lumbar Assessment: Exceptions to [facility] Postural Control Postural Control: Deficits on evaluation  Trunk Control: severely impaired Righting Reactions: absent Protective Responses: absent  Balance Balance Balance Assessed: Yes Static Sitting Balance Static Sitting - Level of Assistance: 1: +2 Total assist Dynamic Sitting Balance Sitting balance - Comments: Posterior, anterior, and L lateral lean noted with no reactional balance strategies during LOB, needing Max/TotA consistently for static sitting balance Extremity Assessment      RLE Assessment General Strength Comments: 4- to 4/ 5 overall distal to proximal LLE Assessment LLE Assessment: Exceptions to [facility] General Strength Comments: 0/5 grossly  Care Tool Care Tool Bed Mobility Roll left and right activity   Roll left and right assist level: Total Assistance - Patient < 25%    Sit to lying activity   Sit to lying assist level: 2 Helpers    Lying to sitting on side of bed activity   Lying to sitting on side of bed assist level: the ability to move from lying on the back to sitting on the side of the bed with no  back support.: 2 Helpers     Care Tool Transfers Sit to stand transfer   Sit to stand assist level: 2 Helpers    Chair/bed transfer   Chair/bed transfer assist level: Dependent - mechanical lift     Toilet transfer Toilet transfer activity did not occur: Safety/medical concerns      Licensed conveyancer transfer activity did not occur: Safety/medical concerns        Care Tool Locomotion Ambulation Ambulation activity did not occur: Safety/medical concerns        Walk 10 feet activity Walk 10 feet activity did not occur: Safety/medical concerns       Walk 50 feet with 2 turns activity Walk 50 feet with 2 turns activity did not occur: Safety/medical concerns      Walk 150 feet activity Walk 150 feet activity did not occur: Safety/medical concerns      Walk 10 feet on uneven surfaces activity Walk 10 feet on uneven surfaces activity did not occur: Safety/medical concerns      Stairs Stair activity did not occur: Safety/medical concerns        Walk up/down 1 step activity Walk up/down 1 step or curb (drop down) activity did not occur: Safety/medical concerns      Walk up/down 4 steps activity Walk up/down 4 steps activity did not occur: Safety/medical concerns      Walk up/down 12 steps activity Walk up/down 12 steps activity did not occur: Safety/medical concerns      Pick up small objects from floor Pick up small object from the floor (from standing position) activity did not occur: Safety/medical concerns      Wheelchair Is the patient using a wheelchair?: Yes Type of Wheelchair: Manual Wheelchair activity did not occur: Safety/medical concerns      Wheel 50 feet with 2 turns activity Wheelchair 50 feet with 2 turns activity did not occur: Safety/medical concerns    Wheel 150 feet activity Wheelchair 150 feet activity did not occur: Safety/medical concerns      Refer to Care Plan for Long Term Goals  SHORT TERM GOAL WEEK 1 PT Short Term Goal 1 (Week 1): Pt  will perform all aspects of bedmobility with consistent MaxA. PT Short Term Goal 2 (Week 1): Pt will perform sit<>stand with MaxA +1 PT Short Term Goal 3 (Week 1): Pt will perform seat to seat transfers with MaxA +1 PT Short Term Goal 4 (Week 1): Pt will initiate sitting balance and maintain with MaxA+1.  PT Short Term Goal 5 (Week 1): Pt will initiate gait training.  Recommendations for other services: Therapeutic Recreation  Stress management and Outing/community reintegration  Skilled Therapeutic Intervention Mobility Bed Mobility Bed Mobility: Rolling Right;Rolling Left;Right Sidelying to Sit;Sit to Sidelying Right Rolling Right: Total Assistance - Patient < 25% Rolling Left: Dependent - Patient equal 0% Right Sidelying to Sit: Total Assistance - Patient < 25% Transfers Transfers: Sit to Stand;Stand to [employer] via Lift Equipment Sit to Stand: Total Assistance - Patient < 25% Stand to Sit: Total Assistance - Patient < 25% Stand Pivot Transfers: Dependent - mechanical lift Transfer via Lift Equipment: Stedy;Maximove Locomotion  Gait Ambulation: No Gait Gait: No Stairs / Additional Locomotion Stairs: No Wheelchair Mobility Wheelchair Mobility: No  Skilled Intervention: PT Evaluation completed; see above for results. PT educated patient in roles of PT vs OT, PT POC, rehab potential, rehab goals, and discharge recommendations along with recommendation for follow-up rehabilitation services. Individual treatment initiated:  Patient supine in bed upon PT arrival. Patient alert and agreeable to PT session. No pain complaint at start of session.  Therapeutic Activity: Bed Mobility: In order to don pants with TotA. Pt guided in log roll using hemitechnique to each side. Patient performed supine to/from sit with ToTA +2. Provided verbal cues for direction of balance loss but pt unable to correct and does not show ability to initiate reactive balance motor  planning. . Transfers: Patient performed sit <> stand to STEDY  with TotA +2. Provided vc for forward lean in order to improve unweighting for improved ease of transfer. .  Neuromuscular Re-ed: NMR facilitated during session with focus on seated balance/ standing balance. Pt guided in seated balance but pt is unable to initiate reactive balance strategies in order to attempt to maintain balance. Requires TotA or MaxA +2  to maintain throughout seated position on EOB and while "standing" in STEDY. Pt demos significant LOB to L side with no ability to correct or initiate motor control to unaffected side.   NMR performed for improvements in motor control and coordination, balance, sequencing, judgement, and self confidence/ efficacy in performing all aspects of mobility at highest level of independence.   Appropriate sized TIS w/c acquired and provided to pt for future moves OOB.   Patient supine in bed  at end of session with brakes locked, bed alarm set, and all needs within reach.   Discharge Criteria: Patient will be discharged from PT if patient refuses treatment 3 consecutive times without medical reason, if treatment goals not met, if there is a change in medical status, if patient makes no progress towards goals or if patient is discharged from hospital.  The above assessment, treatment plan, treatment alternatives and goals were discussed and mutually agreed upon: by patient  [name] PT, DPT, CSRS 11/15/2022, 11:35 PM

## 2022-11-15 NOTE — Progress Notes (Signed)
Inpatient Rehabilitation  Patient information reviewed and entered into eRehab system by Aimar Borghi M. Raju Coppolino, M.A., CCC/SLP, PPS Coordinator.  Information including medical coding, functional ability and quality indicators will be reviewed and updated through discharge.    

## 2022-11-15 NOTE — Discharge Instructions (Addendum)
Inpatient Rehab Discharge Instructions  Samuel Pearson Discharge date and time:    Activities/Precautions/ Functional Status: Activity: no lifting, driving, or strenuous exercise until cleared by MD Diet: cardiac diet/diabetic Wound Care: keep wound clean and dry Functional status:  ___ No restrictions     ___ Walk up steps independently __x_ 24/7 supervision/assistance   ___ Walk up steps with assistance ___ Intermittent supervision/assistance  ___ Bathe/dress independently ___ Walk with walker     ___ Bathe/dress with assistance ___ Walk Independently    ___ Shower independently ___ Walk with assistance    __x_ Shower with assistance _x__ No alcohol     ___ Return to work/school ________  Special Instructions: No driving, alcohol consumption or tobacco use.  STROKE/TIA DISCHARGE INSTRUCTIONS SMOKING Cigarette smoking nearly doubles your risk of having a stroke & is the single most alterable risk factor  If you smoke or have smoked in the last 12 months, you are advised to quit smoking for your health. Most of the excess cardiovascular risk related to smoking disappears within a year of stopping. Ask you doctor about anti-smoking medications [place] Quit Line: 1-800-QUIT NOW Free Smoking Cessation Classes [phone]  CHOLESTEROL Know your levels; limit fat & cholesterol in your diet  Lipid Panel     Component Value Date/Time   CHOL 183 11/08/2022 2103   TRIG 70 11/08/2022 2103   HDL 39 (L) 11/08/2022 2103   CHOLHDL 4.7 11/08/2022 2103   VLDL 14 11/08/2022 2103   LDLCALC 130 (H) 11/08/2022 2103     Many patients benefit from treatment even if their cholesterol is at goal. Goal: Total Cholesterol (CHOL) less than 160 Goal:  Triglycerides (TRIG) less than 150 Goal:  HDL greater than 40 Goal:  LDL (LDLCALC) less than 100   BLOOD PRESSURE American Stroke Association blood pressure target is less that 120/80 mm/Hg  Your discharge blood pressure is:  BP: (!) 156/52 Monitor your  blood pressure Limit your salt and alcohol intake Many individuals will require more than one medication for high blood pressure  DIABETES (A1c is a blood sugar average for last 3 months) Goal HGBA1c is under 7% (HBGA1c is blood sugar average for last 3 months)  Diabetes: No known diagnosis of diabetes    Lab Results  Component Value Date   HGBA1C 5.7 (H) 11/08/2022    Your HGBA1c can be lowered with medications, healthy diet, and exercise. Check your blood sugar as directed by your physician Call your physician if you experience unexplained or low blood sugars.  PHYSICAL ACTIVITY/REHABILITATION Goal is 30 minutes at least 4 days per week  Activity: Increase activity slowly, Therapies: Physical Therapy: Home Health, Occupational Therapy: Home Health, and Speech Therapy: Home Health Return to work: n/a Activity decreases your risk of heart attack and stroke and makes your heart stronger.  It helps control your weight and blood pressure; helps you relax and can improve your mood. Participate in a regular exercise program. Talk with your doctor about the best form of exercise for you (dancing, walking, swimming, cycling).  DIET/WEIGHT Goal is to maintain a healthy weight  Your discharge diet is:  Diet Order             Diet Heart Room service appropriate? Yes with Assist; Fluid consistency: Thin  Diet effective now                  thin liquids Your height is:    Your current weight is:   Your  Body Mass Index (BMI) is:    Following the type of diet specifically designed for you will help prevent another stroke. Your goal weight range is:   Your goal Body Mass Index (BMI) is 19-24. Healthy food habits can help reduce 3 risk factors for stroke:  High cholesterol, hypertension, and excess weight.  RESOURCES Stroke/Support Group:  Call [phone]   STROKE EDUCATION PROVIDED/REVIEWED AND GIVEN TO PATIENT Stroke warning signs and symptoms How to activate emergency medical system  (call 911). Medications prescribed at discharge. Need for follow-up after discharge. Personal risk factors for stroke. Pneumonia vaccine given: No Flu vaccine given: No My questions have been answered, the writing is legible, and I understand these instructions.  I will adhere to these goals & educational materials that have been provided to me after my discharge from the hospital.     My questions have been answered and I understand these instructions. I will adhere to these goals and the provided educational materials after my discharge from the hospital.  Patient/Caregiver Signature _______________________________ Date __________  Clinician Signature _______________________________________ Date __________  Please bring this form and your medication list with you to all your follow-up doctor's appointments.

## 2022-11-15 NOTE — Progress Notes (Signed)
PROGRESS NOTE   Subjective/Complaints:  No issues overnite , per CNA freq loose stool (type 6) Very delayed responses   ROS- neg CP, SOB, N/V , limited by cognition   Objective:   EP PPM/ICD IMPLANT  Result Date: 11/14/2022 SURGEON:  Maurice Small, MD   PREPROCEDURE DIAGNOSIS:  Cryptogenic stroke   POSTPROCEDURE DIAGNOSIS:  Cryptogenic stroke    PROCEDURES:  1. Implantable loop recorder implantation   INTRODUCTION:  Samuel Pearson  is a 77 y.o. patient with a history of cryptogenic stroke. Inpatient telemetry has been reviewed and not shown atrial fibrillation. The patient therefore presents today for implantable loop implantation. The costs of loop recorder monitoring have been discussed with the patient.   DESCRIPTION OF PROCEDURE:  Informed written consent was obtained.  A timeout was performed. The patient required no sedation for the procedure today.  The patients left chest was prepped and draped in the usual sterile fashion. The skin overlying the left parasternal region was infiltrated with lidocaine for local analgesia.  A 0.5-cm incision was made over the left parasternal region over the 3rd intercostal space.  A medtronic implantable loop recorder (SN [id number]) was then placed into the pocket  R waves were very prominent and measured >0.48mV.  Steri- Strips and a sterile dressing were then applied.  There were no early apparent complications.   CONCLUSIONS:  1. Successful implantation of a Medtronic Linq 2 implantable loop recorder for a history of cryptogenic stroke  2. No early apparent complications. Maurice Small, MD Cardiac Electrophysiology   DG Abd Portable 1V  Result Date: 11/13/2022 CLINICAL DATA:  Abdominal distension EXAM: PORTABLE ABDOMEN - 1 VIEW COMPARISON:  Abdominal CT 02/25/2020 FINDINGS: Diffuse gaseous distension of bowel suggesting adynamic ileus, reaching the rectum. No abnormal stool retention. No  concerning mass effect or calcification. Cholecystectomy clips. Cardiomegaly. Clear lung bases. Artifact from EKG leads. IMPRESSION: Generalized gaseous distension of bowel compatible with ileus. Electronically Signed   By: Tiburcio Pea M.D.   On: 11/13/2022 10:55   Recent Labs    11/13/22 0525 11/15/22 0546  WBC 10.9* 7.7  HGB 11.5* 12.2*  HCT 33.6* 35.9*  PLT 176 214   Recent Labs    11/13/22 0525 11/15/22 0546  NA 133* 135  K 3.5 3.5  CL 105 105  CO2 20* 22  GLUCOSE 115* 116*  BUN 12 11  CREATININE 0.69 0.68  CALCIUM 8.1* 8.1*    Intake/Output Summary (Last 24 hours) at 11/15/2022 0744 Last data filed at 11/15/2022 0020 Gross per 24 hour  Intake 480 ml  Output 950 ml  Net -470 ml        Physical Exam: Vital Signs Blood pressure (!) 156/52, pulse 65, temperature 98.6 F (37 C), temperature source Oral, resp. rate 18, SpO2 96 %.   General: No acute distress Mood and affect are appropriate Heart: Regular rate and rhythm no rubs murmurs or extra sounds S/p loop Lungs: Clear to auscultation, breathing unlabored, no rales or wheezes Abdomen: Positive bowel sounds, soft nontender to palpation, moderately distended Extremities: No clubbing, cyanosis, or edema Skin: No evidence of breakdown, no evidence of rash Neurologic: markedly delayed responses but  oriented to person , place and situation , motor strength is 5/5 in RIght and 0/5 left deltoid, bicep, tricep, grip, hip flexor, knee extensors, ankle dorsiflexor and plantar flexor Sensory exam normal sensation to pinch in bilateral upper and lower extremities  Musculoskeletal: Full range of motion in all 4 extremities. No joint swelling   Assessment/Plan: 1. Functional deficits which require 3+ hours per day of interdisciplinary therapy in a comprehensive inpatient rehab setting. Physiatrist is providing close team supervision and 24 hour management of active medical problems listed below. Physiatrist and rehab team  continue to assess barriers to discharge/monitor patient progress toward functional and medical goals  Care Tool:  Bathing              Bathing assist       Upper Body Dressing/Undressing Upper body dressing        Upper body assist      Lower Body Dressing/Undressing Lower body dressing            Lower body assist       Toileting Toileting    Toileting assist       Transfers Chair/bed transfer  Transfers assist           Locomotion Ambulation   Ambulation assist              Walk 10 feet activity   Assist           Walk 50 feet activity   Assist           Walk 150 feet activity   Assist           Walk 10 feet on uneven surface  activity   Assist           Wheelchair     Assist               Wheelchair 50 feet with 2 turns activity    Assist            Wheelchair 150 feet activity     Assist          Blood pressure (!) 156/52, pulse 65, temperature 98.6 F (37 C), temperature source Oral, resp. rate 18, SpO2 96 %.  Medical Problem List and Plan: 1. 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  Antithrombotics: -DVT/anticoagulation:  Pharmaceutical: Lovenox--begin 40mg  daily             -needs screening venous dopplers             -antiplatelet therapy: Aspirin and Plavix for three weeks followed by Plavix alone.  Last dose of aspirin will be 7/29   3. Pain Management: Tylenol as needed   4. Mood/Behavior/Sleep: LCSW to evaluate and provide emotional support             -antipsychotic agents: n/a   5. Neuropsych/cognition: This patient is not capable of making decisions on his own behalf.              -pt has hx of ETOH abuse, 2-3  beers + wine most nights 6. Skin/Wound Care: Routine skin care checks   7. Fluids/Electrolytes/Nutrition: Routine Is and Os and follow-up chemistries in AM             -continue MVI, thiamine   8: Hypertension: monitor TID and prn (home meds: lisinopril, Lopressor)             -continue amlodipine 5 mg daily              9: Hyperlipidemia: continue statin   10: Prediabetes: ? diet controlled; A1c = 5.7%   11: Macrocytic anemia/B12 deficiency: follow-up CBC             -hx of pernicious anemia; PCP rec: daily B12 12. Ileus:             -moving bowels now. Had bm this morning             -senna-s at bedtime             -PPI, prn simethicone 13. 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 Thyroid nodule-TFT's essentially WNL             -f/u as outpt, thyroid U/S, etc.     LOS: 1 days A FACE TO FACE EVALUATION WAS PERFORMED  Erick Colace 11/15/2022, 7:44 AM

## 2022-11-15 NOTE — Evaluation (Signed)
Occupational Therapy Assessment and Plan  Patient Details  Name: CALIX HEINBAUGH MRN: [id number] Date of Birth: [birth year]  OT Diagnosis: abnormal posture, apraxia, cognitive deficits, flaccid hemiplegia and hemiparesis, and hemiplegia affecting non-dominant side Rehab Potential: Rehab Potential (ACUTE ONLY): Fair ELOS: 29-30 days   Today's Date: 11/15/2022 OT Individual Time: 1100-1206 OT Individual Time Calculation (min): 66 min     Hospital Problem: Principal Problem:   CVA (cerebral vascular accident) (HCC)   Past Medical History:  Past Medical History:  Diagnosis Date   Allergic rhinitis    Anxiety    Detached retina    Heart murmur    has had one when a child and checked out ok  never? 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  elevated MCV  No other sx    Past Surgical History:  Past Surgical History:  Procedure Laterality Date   CATARACT EXTRACTION  2009   [facility]   CHOLECYSTECTOMY N/A 02/25/2020   Procedure: LAPAROSCOPIC CHOLECYSTECTOMY;  Surgeon: Harriette Bouillon, MD;  Location: [facility] OR;  Service: General;  Laterality: N/A;   LOOP RECORDER INSERTION N/A 11/14/2022   Procedure: LOOP RECORDER INSERTION;  Surgeon: Maurice Small, MD;  Location: [facility] INVASIVE CV LAB;  Service: Cardiovascular;  Laterality: N/A;    Assessment & Plan Clinical Impression:   Latravious Levitt is a 77 year old male who persented to [facility] ED as code stroke on 11/08/2022.  He awoke that morning and attempted to walk to the bathroom but fell because of left-sided weakness.  His wife noted an episode of bowel incontinence.  Allergy consulted and was outside of the window for tenecteplase.  He was admitted to the hospitalist service. Was reassessed later in the afternoon and noted to have twitching/jerking movements in bilateral lower extremities left worse  than right.  CT of the head without acute findings.  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  No hemorrhage.  MRI also performed of the complete spine but limited due to motion artifact.  Pete CT head was performed as well as EEG.  CTA of the head with intracranial atherosclerotic disease with multifocal stenoses, most notably of a moderate degree of the left paraclinoid ICA.  2D echo with EF of 60 to 65% and left atrial dilation.  Hemoglobin A1c 5.7%.  He was started on aspirin and Plavix for 3 weeks followed by Plavix alone.  Tolerating diet. BP controlled. 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  Patient transferred to CIR on 11/14/2022 .    Patient currently requires total with basic self-care skills secondary to motor apraxia, decreased midline orientation and left side neglect, decreased attention, decreased awareness, decreased safety awareness, decreased memory, and delayed processing, and decreased sitting balance, decreased postural control, and hemiplegia.  Prior to hospitalization, patient was fully independent and driving.  Patient will benefit from skilled intervention to increase independence with basic self-care skills prior to discharge home with care partner.  Anticipate patient will require 24 hour supervision and moderate physical assestance and follow up home health.  OT - End of Session Activity Tolerance: Decreased this session Endurance Deficit: Yes OT Assessment  Rehab Potential (ACUTE ONLY): Fair OT Barriers to Discharge Comments: Pt has family support, but his cognitive and physical limitations may require more care than his family can assist with. OT Patient demonstrates impairments in the following area(s):  Balance;Cognition;Edema;Endurance;Motor;Perception;Safety;Sensory OT Basic ADL's Functional Problem(s): Eating;Grooming;Bathing;Dressing;Toileting OT Transfers Functional Problem(s): Toilet;Tub/Shower OT Additional Impairment(s): Fuctional Use of Upper Extremity OT Plan OT Intensity: Minimum of 1-2 x/day, 45 to 90 minutes OT Frequency: 5 out of 7 days OT Duration/Estimated Length of Stay: 29-30 days OT Treatment/Interventions: Balance/vestibular training;Cognitive remediation/compensation;DME/adaptive equipment instruction;Discharge planning;Functional mobility training;Neuromuscular re-education;Psychosocial support;Patient/family education;Pain management;Self Care/advanced ADL retraining;UE/LE Strength taining/ROM;Therapeutic Exercise;Therapeutic Activities;UE/LE Coordination activities;Visual/perceptual remediation/compensation OT Self Feeding Anticipated Outcome(s): set up OT Basic Self-Care Anticipated Outcome(s): mod A OT Toileting Anticipated Outcome(s): Mod A OT Bathroom Transfers Anticipated Outcome(s): Mod A OT Recommendation Patient destination: Home Follow Up Recommendations: Home health OT Equipment Recommended: To be determined   OT Evaluation Precautions/Restrictions  Precautions Precautions: Fall Precaution Comments: total A to hold sitting balance EOB Restrictions Weight Bearing Restrictions: No    Vital Signs Therapy Vitals Temp: 98.7 F (37.1 C) Temp Source: Oral Pulse Rate: 75 Resp: 18 BP: (Abnormal) 162/54 Patient Position (if appropriate): Lying Oxygen Therapy SpO2: 93 % O2 Device: Room Air Pain Pain Assessment Pain Scale: 0-10 Pain Score: 0-No pain Home Living/Prior Functioning Home Living Family/patient expects to be discharged to:: Private residence Living Arrangements: Spouse/significant other Available Help at Discharge: Family, Available 24 hours/day Type of Home: House Home Access: Stairs to enter Secretary/administrator of Steps:  1 Entrance Stairs-Rails: None Home Layout: One level Bathroom Shower/Tub: Health visitor: Standard  Lives With: Spouse IADL History Education: masters degree MBA Prior Function Level of Independence: Independent with basic ADLs, Independent with gait, Independent with transfers  Able to Take Stairs?: Yes Driving: Yes Vocation: Retired Administrator, sports Baseline Vision/History: 0 No visual deficits Ability to See in Adequate Light: 0 Adequate Patient Visual Report: No change from baseline Additional Comments: unable to have pt follow direction for adequate assessment, pt initially with a R gaze preference and not turning head to his L.  By the end of the session he was following and tracking to the Left. Able to reach clock and names on the wall board. Perception  Perception: Impaired Inattention/Neglect: Does not attend to left visual field;Does not attend to left side of body Topographical Orientation: Pt states he feels that he is sitting up straight when he is pushing back and to the left. Praxis Praxis: Impaired Praxis Impairment Details: Motor planning;Initiation Cognition Cognition Overall Cognitive Status: Impaired/Different from baseline Arousal/Alertness: Awake/alert Orientation Level: Person;Place;Situation Person: Oriented Place: Oriented Situation: Oriented ("the doctors told me I had a stroke") Memory: Impaired Memory Impairment: Retrieval deficit;Decreased short term memory Decreased Short Term Memory: Verbal basic;Functional basic Attention: Sustained;Selective;Focused Focused Attention: Impaired Focused Attention Impairment: Verbal basic;Functional basic Sustained Attention: Impaired Sustained Attention Impairment: Verbal basic;Functional basic Selective Attention: Impaired Selective Attention Impairment: Verbal basic;Functional basic Awareness: Impaired Awareness Impairment: Intellectual impairment Problem Solving: Impaired Problem Solving  Impairment: Verbal basic;Functional basic Executive Function: Initiating Initiating: Impaired Initiating Impairment: Verbal basic;Functional basic Safety/Judgment: Impaired Brief Interview for Mental Status (BIMS) Repetition of Three Words (First Attempt): 3 Temporal Orientation: Year: Correct Temporal Orientation: Month: Accurate within 5 days Temporal Orientation: Day: Correct Recall: "Sock": Yes, after cueing ("something to wear") Recall: "Blue": Yes, no cue required Recall: "Bed": No, could not recall BIMS Summary Score: 12 Sensation Sensation Light Touch: Appears Intact Hot/Cold: Appears Intact Proprioception: Impaired by gross assessment Stereognosis: Impaired by  gross assessment Coordination Gross Motor Movements are Fluid and Coordinated: No Fine Motor Movements are Fluid and Coordinated: No Finger Nose Finger Test: dense hemiplegia in LUE Motor  Motor Motor: Hemiplegia;Abnormal postural alignment and control  Trunk/Postural Assessment  Cervical Assessment Cervical Assessment: Exceptions to [facility] Cervical AROM Overall Cervical AROM: Deficits Overall Cervical AROM Comments: limited, 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 Thoracic Assessment Thoracic Assessment: Exceptions to [facility] (pt with overuse of thoracic extensors and pushes himself backwards in sitting) Postural Control Postural Control: Deficits on evaluation Trunk Control: severely impaired Righting Reactions: absent Protective Responses: absent  Balance Static Sitting Balance Static Sitting - Level of Assistance: 1: +2 Total assist Extremity/Trunk Assessment RUE Assessment RUE Assessment: Within Functional Limits LUE Assessment Passive Range of Motion (PROM) Comments: WFL Active Range of Motion (AROM) Comments: 0 AROM LUE Body System: Neuro Brunstrum levels for arm and hand: Arm;Hand Brunstrum level for arm: Stage I Presynergy Brunstrum level for hand: Stage I Flaccidity  Care Tool Care Tool Self  Care Eating   Eating Assist Level: Moderate Assistance - Patient 50 - 74%    Oral Care    Oral Care Assist Level: Moderate Assistance - Patient 50 - 74%    Bathing   Body parts bathed by patient: Chest;Abdomen;Face Body parts bathed by helper: Left arm;Right arm;Front perineal area;Buttocks;Left upper leg;Right upper leg;Right lower leg;Left lower leg   Assist Level: Maximal Assistance - Patient 24 - 49%    Upper Body Dressing(including orthotics)   What is the patient wearing?: Pull over shirt   Assist Level: 2 Helpers    Lower Body Dressing (excluding footwear)   What is the patient wearing?: Incontinence brief;Pants Assist for lower body dressing: 2 Helpers    Putting on/Taking off footwear   What is the patient wearing?: Non-skid slipper socks Assist for footwear: Dependent - Patient 0%       Care Tool Toileting Toileting activity   Assist for toileting: Dependent - Patient 0%     Care Tool Bed Mobility Roll left and right activity   Roll left and right assist level: Maximal Assistance - Patient 25 - 49%    Sit to lying activity   Sit to lying assist level: 2 Helpers    Lying to sitting on side of bed activity   Lying to sitting on side of bed assist level: the ability to move from lying on the back to sitting on the side of the bed with no back support.: 2 Helpers     Care Tool Transfers Sit to stand transfer Sit to stand activity did not occur: Safety/medical concerns      Chair/bed transfer Chair/bed transfer activity did not occur: Safety/medical concerns       Toilet transfer Toilet transfer activity did not occur: Safety/medical concerns       Care Tool Cognition  Expression of Ideas and Wants Expression of Ideas and Wants: 2. Frequent difficulty - frequently exhibits difficulty with expressing needs and ideas  Understanding Verbal and Non-Verbal Content Understanding Verbal and Non-Verbal Content: 3. Usually understands - understands most  conversations, but misses some part/intent of message. Requires cues at times to understand   Memory/Recall Ability Memory/Recall Ability : Location of own room;That he or she is in a hospital/hospital unit   Refer to Care Plan for Long Term Goals  SHORT TERM GOAL WEEK 1 OT Short Term Goal 1 (Week 1): Pt will be able to maintain static sitting EOB with mod A or less A to  enable him to safely sit on a BSC. OT Short Term Goal 2 (Week 1): Pt will demonstrate increased postural awareness to recognize with mod cues when he is leaning/falling back or to his L in unsupported sitting. OT Short Term Goal 3 (Week 1): Pt will demonstrate improved L side awareness to wash left arm with min cues. OT Short Term Goal 4 (Week 1): Pt will be able to self feed with min A.  Recommendations for other services: None  and Therapeutic Recreation  Pet therapy   Skilled Therapeutic Intervention ADL ADL Eating: Moderate assistance Grooming: Moderate assistance Upper Body Bathing: Moderate assistance Lower Body Bathing: Dependent Upper Body Dressing: Dependent Where Assessed-Upper Body Dressing: Bed level Lower Body Dressing: Dependent Where Assessed-Lower Body Dressing: Bed level Toileting: Unable to assess Toilet Transfer: Unable to assess    Pt seen for initial evaluation and ADL training with a focus on safe techniques as pt is not safe to sit on a BSC or in a standard wc at this time.  Reviewed role of OT, discussed POC and pt's goals, and ELOS with pt and his wife.  Had pt sit to EOB 2x with total A and faciliated neutral posture but pt unaware he was falling back and to the side.  Had to assist pt from bed level for staff and pt safety with +2 assist.  Brief wet and pt changed.  Pt donned clothing from bed with total A of 2.  Placed TED hose on L leg for ankle edema and placed pillows under L arm for support.  Pt resting in bed With all needs met and bed alarm set.     Discharge Criteria: Patient will  be discharged from OT if patient refuses treatment 3 consecutive times without medical reason, if treatment goals not met, if there is a change in medical status, if patient makes no progress towards goals or if patient is discharged from hospital.  The above assessment, treatment plan, treatment alternatives and goals were discussed and mutually agreed upon: by patient  Carnelius Hammitt 11/15/2022, 1:34 PM

## 2022-11-16 ENCOUNTER — Telehealth: Payer: Self-pay

## 2022-11-16 DIAGNOSIS — I63521 Cerebral infarction due to unspecified occlusion or stenosis of right anterior cerebral artery: Secondary | ICD-10-CM | POA: Diagnosis not present

## 2022-11-16 MED ORDER — ENOXAPARIN SODIUM 40 MG/0.4ML IJ SOSY
40.0000 mg | PREFILLED_SYRINGE | INTRAMUSCULAR | Status: DC
  Administered 2022-11-16 – 2022-12-08 (×23): 40 mg via SUBCUTANEOUS
  Filled 2022-11-16 (×23): qty 0.4

## 2022-11-16 MED ORDER — ASPIRIN 81 MG PO TBEC
81.0000 mg | DELAYED_RELEASE_TABLET | Freq: Every day | ORAL | Status: DC
  Administered 2022-11-16 – 2022-11-25 (×10): 81 mg via ORAL
  Filled 2022-11-16 (×10): qty 1

## 2022-11-16 MED ORDER — CLOPIDOGREL BISULFATE 75 MG PO TABS
75.0000 mg | ORAL_TABLET | Freq: Every day | ORAL | Status: DC
  Administered 2022-11-16 – 2022-11-25 (×10): 75 mg via ORAL
  Filled 2022-11-16 (×10): qty 1

## 2022-11-16 NOTE — Patient Care Conference (Signed)
Inpatient RehabilitationTeam Conference and Plan of Care Update Date: 11/16/2022   Time: 10:05 AM    Patient Name: Samuel Pearson      Medical Record Number: [id number]  Date of Birth: Nov 10, 1945 Sex: Male         Room/Bed: 4M03C/4M03C-01 Payor Info: Payor: Multimedia programmer / Plan: [facility] MEDICARE / Product Type: *No Product type* /    Admit Date/Time:  11/14/2022  4:37 PM  Primary Diagnosis:  CVA (cerebral vascular accident) [facility])  Hospital Problems: Principal Problem:   CVA (cerebral vascular accident) [facility])    Expected Discharge Date: Expected Discharge Date: 12/13/22  Team Members Present: Physician leading conference: Dr. Claudette Laws Social Worker Present: Lavera Guise, BSW Nurse Present: Chana Bode, RN PT Present: Ralph Leyden, PT OT Present: Primitivo Gauze, OT SLP Present: Feliberto Gottron, SLP PPS Coordinator present : Fae Pippin, SLP     Current Status/Progress Goal Weekly Team Focus  Bowel/Bladder   last BM 07/08, abdomen still bloated and tender, passing urine freely   daily BM, no constipation   daily BM    Swallow/Nutrition/ Hydration               ADL's   total A due to severe postural control imp., limited awareness, L neglect, L hemiplegia   mod A   improving postural control to enable pt to sit safely on BSC, in a regular w/c.  improving awareness for increased pt engagement in therapy activities.    Mobility   Bed mobility = TotA, transfers = TotA, sitting balance = TotA with no initiation to correct in any direction; no ambulation yet   ModA overall  Barriers: hemiplegia with slow processing/ planning /// Work on: awareness, initiation, motor planning, motor control of unaffected side, midline orientation    Communication                Safety/Cognition/ Behavioral Observations  noted during evaluation: significant processing delays of up to 20 seconds after a question before verbal response, unaware of deficits, not  oriented to time, L inattention   mod I, supervision   attention to L side, increasing processing speed, increasing awareness of deficits, orientation to time (w/ calendar)    Pain   pain is controlled   be pain free   tolerate activities without pain    Skin   intact, no pressure sores   be free from pressure sores  repositioning q2      Discharge Planning:  Discharging home with spouse able to assist some. Providing supervision 24/7.   Team Discussion: Patient with small DVTs on Eliquis, post loop recorder placement. Patient exhibits a flat affect, delayed responses, poor awareness of deficits, poor postural control post CVA. Limited by pushing syndrome, demonstrates no protective responses, motor planning deficits, decreased initiation and left inattention.   Patient on target to meet rehab goals: no, currently needs total assist for mobility and total assist +2 for balance. Goals for discharge set for mod assist overall.  *See Care Plan and progress notes for long and short-term goals.   Revisions to Treatment Plan:  N/a   Teaching Needs: Safety, medications, dietary modifications, skin care, transfers, toileting, etc.   Current Barriers to Discharge: Home enviroment access/layout, Incontinence, Lack of/limited family support, and Insurance for SNF coverage  Possible Resolutions to Barriers: Family education     Medical Summary Current Status: HTN, Left calf DVT  Barriers to Discharge: Uncontrolled Hypertension;Other (comments)  Barriers to Discharge Comments: Left DVT Possible Resolutions  to Barriers/Weekly Focus: discussed management options with wife   Continued Need for Acute Rehabilitation Level of Care: The patient requires daily medical management by a physician with specialized training in physical medicine and rehabilitation for the following reasons: Direction of a multidisciplinary physical rehabilitation program to maximize functional independence :  Yes Medical management of patient stability for increased activity during participation in an intensive rehabilitation regime.: Yes Analysis of laboratory values and/or radiology reports with any subsequent need for medication adjustment and/or medical intervention. : Yes   I attest that I was present, lead the team conference, and concur with the assessment and plan of the team.   Chana Bode B 11/16/2022, 3:33 PM

## 2022-11-16 NOTE — Progress Notes (Signed)
PROGRESS NOTE   Subjective/Complaints:  No issues overnite   ROS- neg CP, SOB, N/V , limited by cognition   Objective:   VAS [place] LOWER EXTREMITY VENOUS (DVT)  Result Date: 11/15/2022  Lower Venous DVT Study Patient Name:  Samuel Pearson  Date of Exam:   11/15/2022 Medical Rec #: [id number]         Accession #:    [id number] Date of Birth: 11/30/1945         Patient Gender: M Patient Age:   77 years Exam Location:  [facility] Procedure:      VAS [place] LOWER EXTREMITY VENOUS (DVT) Referring Phys: Faith Rogue --------------------------------------------------------------------------------  Indications: Edema (rehab patient - CVA).  Performing Technologist: Ernestene Mention RVT, RDMS  Examination Guidelines: A complete evaluation includes B-mode imaging, spectral Doppler, color Doppler, and power Doppler as needed of all accessible portions of each vessel. Bilateral testing is considered an integral part of a complete examination. Limited examinations for reoccurring indications may be performed as noted. The reflux portion of the exam is performed with the patient in reverse Trendelenburg.  +---------+---------------+---------+-----------+----------+--------------+ RIGHT    CompressibilityPhasicitySpontaneityPropertiesThrombus Aging +---------+---------------+---------+-----------+----------+--------------+ CFV      Full           Yes      Yes                                 +---------+---------------+---------+-----------+----------+--------------+ SFJ      Full                                                        +---------+---------------+---------+-----------+----------+--------------+ FV Prox  Full           Yes      Yes                                 +---------+---------------+---------+-----------+----------+--------------+ FV Mid   Full           Yes      Yes                                  +---------+---------------+---------+-----------+----------+--------------+ FV DistalFull           Yes      Yes                                 +---------+---------------+---------+-----------+----------+--------------+ PFV      Full                                                        +---------+---------------+---------+-----------+----------+--------------+ POP  Full           Yes      Yes                                 +---------+---------------+---------+-----------+----------+--------------+ PTV      Full                                                        +---------+---------------+---------+-----------+----------+--------------+ PERO     Full                                                        +---------+---------------+---------+-----------+----------+--------------+   +---------+---------------+---------+-----------+----------+--------------+ LEFT     CompressibilityPhasicitySpontaneityPropertiesThrombus Aging +---------+---------------+---------+-----------+----------+--------------+ CFV      Full                                                        +---------+---------------+---------+-----------+----------+--------------+ SFJ      Full                                                        +---------+---------------+---------+-----------+----------+--------------+ FV Prox  Full                                                        +---------+---------------+---------+-----------+----------+--------------+ FV Mid   Full                                                        +---------+---------------+---------+-----------+----------+--------------+ FV DistalFull                                                        +---------+---------------+---------+-----------+----------+--------------+ PFV      Full                                                         +---------+---------------+---------+-----------+----------+--------------+ POP      Full                                                        +---------+---------------+---------+-----------+----------+--------------+  PTV      Full                                                        +---------+---------------+---------+-----------+----------+--------------+ PERO     None           No       No                   Acute          +---------+---------------+---------+-----------+----------+--------------+ Gastroc  None           No       No                   Acute          +---------+---------------+---------+-----------+----------+--------------+     Summary: BILATERAL: -No evidence of popliteal cyst, bilaterally. RIGHT: - There is no evidence of deep vein thrombosis in the lower extremity.  LEFT: - Findings consistent with acute deep vein thrombosis involving the left peroneal veins, and left gastrocnemius veins.  *See table(s) above for measurements and observations. Electronically signed by Coral Else MD on 11/15/2022 at 9:56:11 PM.    Final    EP PPM/ICD IMPLANT  Result Date: 11/15/2022 SURGEON:  Maurice Small, MD   PREPROCEDURE DIAGNOSIS:  Cryptogenic stroke   POSTPROCEDURE DIAGNOSIS:  Cryptogenic stroke    PROCEDURES:  1. Implantable loop recorder implantation   INTRODUCTION:  Samuel Pearson  is a 77 y.o. patient with a history of cryptogenic stroke. Inpatient telemetry has been reviewed and not shown atrial fibrillation. The patient therefore presents today for implantable loop implantation. The costs of loop recorder monitoring have been discussed with the patient.   DESCRIPTION OF PROCEDURE:  Informed written consent was obtained.  A timeout was performed. The patient required no sedation for the procedure today.  The patients left chest was prepped and draped in the usual sterile fashion. The skin overlying the left parasternal region was infiltrated with lidocaine  for local analgesia.  A 0.5-cm incision was made over the left parasternal region over the 3rd intercostal space.  A medtronic implantable loop recorder (SN [id number]) was then placed into the pocket  R waves were very prominent and measured >0.37mV.  Steri- Strips and a sterile dressing were then applied.  There were no early apparent complications.   CONCLUSIONS:  1. Successful implantation of a Medtronic Linq 2 implantable loop recorder for a history of cryptogenic stroke  2. No early apparent complications. Maurice Small, MD Cardiac Electrophysiology   Recent Labs    11/15/22 0546  WBC 7.7  HGB 12.2*  HCT 35.9*  PLT 214    Recent Labs    11/15/22 0546  NA 135  K 3.5  CL 105  CO2 22  GLUCOSE 116*  BUN 11  CREATININE 0.68  CALCIUM 8.1*     Intake/Output Summary (Last 24 hours) at 11/16/2022 0720 Last data filed at 11/15/2022 1950 Gross per 24 hour  Intake 807 ml  Output 100 ml  Net 707 ml         Physical Exam: Vital Signs Blood pressure (!) 165/53, pulse 68, temperature 98.1 F (36.7 C), resp. rate 16, SpO2 97 %.   General: No acute distress Mood and affect are appropriate Heart: Regular rate and  rhythm no rubs murmurs or extra sounds S/p loop Lungs: Clear to auscultation, breathing unlabored, no rales or wheezes Abdomen: Positive bowel sounds, soft nontender to palpation, moderately distended Extremities: No clubbing, cyanosis, or edema, no calf tenderness or swelling on left side  Skin: No evidence of breakdown, no evidence of rash Neurologic: markedly delayed responses but oriented to person , place and situation , motor strength is 5/5 in RIght and 0/5 left deltoid, bicep, tricep, grip, hip flexor, knee extensors, ankle dorsiflexor and plantar flexor Sensory exam normal sensation to pinch in bilateral upper and lower extremities  Musculoskeletal: Full range of motion in all 4 extremities. No joint swelling   Assessment/Plan: 1. Functional deficits which  require 3+ hours per day of interdisciplinary therapy in a comprehensive inpatient rehab setting. Physiatrist is providing close team supervision and 24 hour management of active medical problems listed below. Physiatrist and rehab team continue to assess barriers to discharge/monitor patient progress toward functional and medical goals  Care Tool:  Bathing    Body parts bathed by patient: Chest, Abdomen, Face   Body parts bathed by helper: Left arm, Right arm, Front perineal area, Buttocks, Left upper leg, Right upper leg, Right lower leg, Left lower leg     Bathing assist Assist Level: Maximal Assistance - Patient 24 - 49%     Upper Body Dressing/Undressing Upper body dressing   What is the patient wearing?: Pull over shirt    Upper body assist Assist Level: 2 Helpers    Lower Body Dressing/Undressing Lower body dressing      What is the patient wearing?: Incontinence brief, Pants     Lower body assist Assist for lower body dressing: 2 Helpers     Toileting Toileting    Toileting assist Assist for toileting: Dependent - Patient 0%     Transfers Chair/bed transfer  Transfers assist  Chair/bed transfer activity did not occur: Safety/medical concerns  Chair/bed transfer assist level: Dependent - mechanical lift     Locomotion Ambulation   Ambulation assist   Ambulation activity did not occur: Safety/medical concerns          Walk 10 feet activity   Assist  Walk 10 feet activity did not occur: Safety/medical concerns        Walk 50 feet activity   Assist Walk 50 feet with 2 turns activity did not occur: Safety/medical concerns         Walk 150 feet activity   Assist Walk 150 feet activity did not occur: Safety/medical concerns         Walk 10 feet on uneven surface  activity   Assist Walk 10 feet on uneven surfaces activity did not occur: Safety/medical concerns         Wheelchair     Assist Is the patient using a  wheelchair?: Yes Type of Wheelchair: Manual Wheelchair activity did not occur: Safety/medical concerns         Wheelchair 50 feet with 2 turns activity    Assist    Wheelchair 50 feet with 2 turns activity did not occur: Safety/medical concerns       Wheelchair 150 feet activity     Assist  Wheelchair 150 feet activity did not occur: Safety/medical concerns       Blood pressure (!) 165/53, pulse 68, temperature 98.1 F (36.7 C), resp. rate 16, SpO2 97 %.  Medical Problem List and Plan: 1. 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  Antithrombotics: -DVT/anticoagulation:  Pharmaceutical:             left tib and peroneal DVTs, started on Eliquis, pt asymptomatic , discussed option of monitoring on lovenox only with serial dopplers, pt unable to make decision will discuss with wife r             -antiplatelet therapy: Aspirin and Plavix for three weeks followed by Plavix alone.  Last dose of aspirin will be 7/29   3. Pain Management: Tylenol as needed   4. Mood/Behavior/Sleep: LCSW to evaluate and provide emotional support             -antipsychotic agents: n/a   5. Neuropsych/cognition: This patient is not capable of making decisions on his own behalf.             -pt has hx of ETOH abuse, 2-3 beers + wine most nights 6. Skin/Wound Care: Routine skin care checks   7. Fluids/Electrolytes/Nutrition: Routine Is and Os and follow-up chemistries in AM             -continue MVI, thiamine   8: Hypertension: monitor TID and prn (home meds: lisinopril, Lopressor)             -continue amlodipine 5 mg daily              9: Hyperlipidemia: continue statin   10: Prediabetes: ? diet  controlled; A1c = 5.7%   11: Macrocytic anemia/B12 deficiency: follow-up CBC             -hx of pernicious anemia; PCP rec: daily B12 12. Ileus:             -moving bowels now. Had bm this morning             -senna-s at bedtime             -PPI, prn simethicone 13. 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 Thyroid nodule-TFT's essentially WNL             -f/u as outpt, thyroid U/S, etc.     LOS: 2 days A FACE TO FACE EVALUATION WAS PERFORMED  Erick Colace 11/16/2022, 7:20 AM

## 2022-11-16 NOTE — Plan of Care (Signed)
  Problem: RH Balance Goal: LTG Patient will maintain dynamic sitting balance (PT) Description: LTG:  Patient will maintain dynamic sitting balance with assistance during mobility activities (PT) Flowsheets (Taken 11/15/2022 1229) LTG: Pt will maintain dynamic sitting balance during mobility activities with:: Minimal Assistance - Patient > 75% Goal: LTG Patient will maintain dynamic standing balance (PT) Description: LTG:  Patient will maintain dynamic standing balance with assistance during mobility activities (PT) Flowsheets (Taken 11/15/2022 1229) LTG: Pt will maintain dynamic standing balance during mobility activities with:: Moderate Assistance - Patient 50 - 74%   Problem: Sit to Stand Goal: LTG:  Patient will perform sit to stand with assistance level (PT) Description: LTG:  Patient will perform sit to stand with assistance level (PT) Flowsheets (Taken 11/15/2022 1229) LTG: PT will perform sit to stand in preparation for functional mobility with assistance level: Moderate Assistance - Patient 50 - 74%   Problem: RH Bed Mobility Goal: LTG Patient will perform bed mobility with assist (PT) Description: LTG: Patient will perform bed mobility with assistance, with/without cues (PT). Flowsheets (Taken 11/15/2022 1229) LTG: Pt will perform bed mobility with assistance level of: Minimal Assistance - Patient > 75%   Problem: RH Bed to Chair Transfers Goal: LTG Patient will perform bed/chair transfers w/assist (PT) Description: LTG: Patient will perform bed to chair transfers with assistance (PT). Flowsheets (Taken 11/15/2022 1229) LTG: Pt will perform Bed to Chair Transfers with assistance level: Minimal Assistance - Patient > 75%   Problem: RH Car Transfers Goal: LTG Patient will perform car transfers with assist (PT) Description: LTG: Patient will perform car transfers with assistance (PT). Flowsheets (Taken 11/15/2022 1229) LTG: Pt will perform car transfers with assist:: Moderate Assistance -  Patient 50 - 74%   Problem: RH Ambulation Goal: LTG Patient will ambulate in controlled environment (PT) Description: LTG: Patient will ambulate in a controlled environment, # of feet with assistance (PT). Flowsheets (Taken 11/15/2022 1229) LTG: Pt will ambulate in controlled environ  assist needed:: Moderate Assistance - Patient 50 - 74% LTG: Ambulation distance in controlled environment: up to 50 ft   Problem: RH Wheelchair Mobility Goal: LTG Patient will propel w/c in controlled environment (PT) Description: LTG: Patient will propel wheelchair in controlled environment, # of feet with assist (PT) Flowsheets (Taken 11/15/2022 1229) LTG: Pt will propel w/c in controlled environ  assist needed:: Minimal Assistance - Patient > 75% LTG: Propel w/c distance in controlled environment: 100 ft Goal: LTG Patient will propel w/c in home environment (PT) Description: LTG: Patient will propel wheelchair in home environment, # of feet with assistance (PT). Flowsheets (Taken 11/15/2022 1229) LTG: Pt will propel w/c in home environ  assist needed:: Minimal Assistance - Patient > 75% Distance: wheelchair distance in controlled environment: 100 LTG: Propel w/c distance in home environment: 30 ft

## 2022-11-16 NOTE — IPOC Note (Signed)
Overall Plan of Care [facility]) Patient Details Name: Samuel Pearson MRN: [id number] DOB: 1945/10/30  Admitting Diagnosis: CVA (cerebral vascular accident) [facility])  Hospital Problems: Principal Problem:   CVA (cerebral vascular accident) [facility])     Functional Problem List: Nursing Bladder, Bowel, Endurance, Medication Management, Safety  PT Balance, Behavior, Edema, Endurance, Nutrition, Pain, Perception, Sensory, Motor  OT Balance, Cognition, Edema, Endurance, Motor, Perception, Safety, Sensory  SLP Cognition  TR         Basic ADL's: OT Eating, Grooming, Bathing, Dressing, Toileting     Advanced  ADL's: OT       Transfers: PT Bed Mobility, Bed to Chair, Car, Furniture, Civil Service fast streamer, Research scientist (life sciences): PT Ambulation, Stairs     Additional Impairments: OT Fuctional Use of Upper Extremity  SLP Social Cognition   Awareness, Attention, Memory, Problem Solving  TR      Anticipated Outcomes Item Anticipated Outcome  Self Feeding set up  Swallowing      Basic self-care  mod A  Toileting  Mod A   Bathroom Transfers Mod A  Bowel/Bladder  manage bowel and bladder w toileting assist  Transfers  ModA  Locomotion  ModA using LRAD  Communication     Cognition  supervision A/ mod I  Pain  n/a  Safety/Judgment  manage w cues   Therapy Plan: PT Intensity: Minimum of 1-2 x/day ,45 to 90 minutes PT Frequency: 5 out of 7 days PT Duration Estimated Length of Stay: 4-5 weeks OT Intensity: Minimum of 1-2 x/day, 45 to 90 minutes OT Frequency: 5 out of 7 days OT Duration/Estimated Length of Stay: 29-30 days SLP Intensity: Minumum of 1-2 x/day, 30 to 90 minutes SLP Frequency: 1 to 3 out of 7 days SLP Duration/Estimated Length of Stay: 24-28 days   Team Interventions: Nursing Interventions Bladder Management, Disease Management/Prevention, Medication Management, Discharge Planning, Bowel Management, Patient/Family Education  PT interventions Ambulation/gait  training, Discharge planning, Functional mobility training, Psychosocial support, Therapeutic Activities, Visual/perceptual remediation/compensation, Warden/ranger, Disease management/prevention, Neuromuscular re-education, Skin care/wound management, Wheelchair propulsion/positioning, UE/LE Strength taining/ROM, Splinting/orthotics, DME/adaptive equipment instruction, Cognitive remediation/compensation, Firefighter, Equities trader education, Museum/gallery curator, UE/LE Coordination activities, Functional electrical stimulation  OT Interventions Warden/ranger, Cognitive remediation/compensation, DME/adaptive equipment instruction, Discharge planning, Functional mobility training, Neuromuscular re-education, Psychosocial support, Patient/family education, Pain management, Self Care/advanced ADL retraining, UE/LE Strength taining/ROM, Therapeutic Exercise, Therapeutic Activities, UE/LE Coordination activities, Visual/perceptual remediation/compensation  SLP Interventions Cognitive remediation/compensation, Internal/external aids, Speech/Language facilitation, Cueing hierarchy, Environmental controls, Therapeutic Activities, Functional tasks, Patient/family education, Therapeutic Exercise  TR Interventions    SW/CM Interventions Discharge Planning, Psychosocial Support, Patient/Family Education, Disease Management/Prevention   Barriers to Discharge MD  Medical stability  Nursing Decreased caregiver support, Home environment access/layout 1 level 1 ste no rail w spouse; shuffled gait pattern at baseline and furniture walking  PT Inaccessible home environment, Decreased caregiver support, Home environment access/layout, Incontinence, Lack of/limited family support, Insurance for SNF coverage, Weight, Behavior    OT   Pt has family support, but his cognitive and physical limitations may require more care than his family can assist with.  SLP      SW Insurance for SNF  coverage, Lack of/limited family support, Decreased caregiver support, Home environment access/layout     Team Discharge Planning: Destination: PT-Home ,OT- Home , SLP-Home Projected Follow-up: PT-Home health PT, Outpatient PT, 24 hour supervision/assistance, OT-  Home health OT, SLP-Outpatient SLP Projected Equipment Needs: PT-To be determined, OT- To be determined, SLP-None recommended  by SLP Equipment Details: PT- , OT-  Patient/family involved in discharge planning: PT- Patient, Family member/caregiver,  OT-Patient, Family member/caregiver, SLP-Family member/caregiver, Patient  MD ELOS: 23-28d Medical Rehab Prognosis:  Fair Assessment: The patient has been admitted for CIR therapies with the diagnosis of CVA. The team will be addressing functional mobility, strength, stamina, balance, safety, adaptive techniques and equipment, self-care, bowel and bladder mgt, patient and caregiver education, DVT. Goals have been set at Mod A. Anticipated discharge destination is Home with wife .        See Team Conference Notes for weekly updates to the plan of care

## 2022-11-16 NOTE — Progress Notes (Signed)
Patient ID: Samuel Pearson, male   DOB: [birth year], 77 y.o.   MRN: [id number]  1206 PM: Sw made attempt to meet with patient and spouse to provide conference updates. Patient receiving care, Sw will continue to follow up.

## 2022-11-16 NOTE — Progress Notes (Signed)
Physical Therapy Session Note  Patient Details  Name: Samuel Pearson MRN: [id number] Date of Birth: [birth year]  Today's Date: 11/16/2022 PT Individual Time: 1102-1204 PT Individual Time Calculation (min): 62 min   Short Term Goals: Week 1:  PT Short Term Goal 1 (Week 1): Pt will perform all aspects of bedmobility with consistent MaxA. PT Short Term Goal 2 (Week 1): Pt will perform sit<>stand with MaxA +1 PT Short Term Goal 3 (Week 1): Pt will perform seat to seat transfers with MaxA +1 PT Short Term Goal 4 (Week 1): Pt will initiate sitting balance and maintain with MaxA+1. PT Short Term Goal 5 (Week 1): Pt will initiate gait training.  Skilled Therapeutic Interventions/Progress Updates:  Patient supine in bed on entrance to room. Patient alert and agreeable to PT session. Wife present. Wife relates that pt has begun to feed himself with R hand with minimal need for assist once plate is setup.   Patient with no pain complaint at start of session.  Therapeutic Activity: Bed Mobility: Pt performed supine <> sit with multimodal cues for technique and sequencing. Is able to find L hand and bring arm across chest with cueing. Requires TotA for bringing shoulder and hip over into sidelying. Improves in following instructions to come up to elbow prop with Mod/ MaxA, then reposition onto palm to push up to upright seated position with MaxA. Continued poor trunk control with need for Max/ TotA to maintain d/t lean to L and A/P.  Transfers: Pt performed sit<>stand transfer using STEDY and remembers that he will have to place hand on bar to pull assist into stance. Requires vc of steps prior to performance and allowed to count in order to initiate. Increased activation of unaffected LE and assist into stance. From elevated bed surface he is able to complete with Mod/ Max +2 for safety/ trunk control. From perch seat and improving awareness of R side, he is able to rise to stand with ModA to perform but  continued TotA for balance/ trunk control. Continued need for TotA for return to sit. Transferred to w/c for session.   Neuromuscular Re-ed: NMR facilitated during session with focus on standing balance, trunk initiation, upright posture, midline orientation. Pt guided in sit<>stand using hallway HR to R hand. Unable to pull trunk into upright posture without TotA. But initiates rise to stance with MaxA and pt demonstrating initiation with RLE activation. Will also require assist to maintain shoulder positioning with sling during any stance.   NMR performed for improvements in motor control and coordination, balance, sequencing, judgement, and self confidence/ efficacy in performing all aspects of mobility at highest level of independence.   Pt soiled and NT assists with pericare and brief change in bed with therapist providing cues for sequencing technique for increased pt participation and attempt to activate R hemibody. Requires MaxA +2 to complete.   Patient supine in bed at end of session with brakes locked, bed alarm set, and all needs within reach.   Therapy Documentation Precautions:  Precautions Precautions: Fall Precaution Comments: dense L hemi with inability to initiate motor control with R hemibody Restrictions Weight Bearing Restrictions: No General:   Vital Signs:  Pain: Pain Assessment Pain Score: 0-No pain this session.   Therapy/Group: Individual Therapy  [name] PT, DPT, CSRS 11/16/2022, 10:44 AM

## 2022-11-16 NOTE — Telephone Encounter (Signed)
Attempted to contact patient about ILR post f/u. No answer, LMTCB.   Carelink monitor is connected and UTD as  11/16/22.

## 2022-11-16 NOTE — Telephone Encounter (Signed)
-----   Message from Graciella Freer, PA-C sent at 11/14/2022  2:11 PM EDT ----- Regarding: Same Day Discharge LOOP 11/14/22 Dr. Nelly Laurence

## 2022-11-16 NOTE — Progress Notes (Signed)
Physical Therapy Session Note  Patient Details  Name: Samuel Pearson MRN: [id number] Date of Birth: [birth year]  Today's Date: 11/16/2022 PT Individual Time: 1447-1540 PT Individual Time Calculation (min): 53 min   Short Term Goals: Week 1:  PT Short Term Goal 1 (Week 1): Pt will perform all aspects of bedmobility with consistent MaxA. PT Short Term Goal 2 (Week 1): Pt will perform sit<>stand with MaxA +1 PT Short Term Goal 3 (Week 1): Pt will perform seat to seat transfers with MaxA +1 PT Short Term Goal 4 (Week 1): Pt will initiate sitting balance and maintain with MaxA+1. PT Short Term Goal 5 (Week 1): Pt will initiate gait training.  Skilled Therapeutic Interventions/Progress Updates:  Patient supine in bed on entrance to room. Patient alert and agreeable to PT session.   Patient with no pain complaint at start of session.  Therapeutic Activity: Bed Mobility: Pt performed supine <> sit with multimodal cues for technique and sequencing. Requires TotA to reach sidelying to R side. Shows delay in following instructions to come up to elbow prop with MaxA,and then time and vc/ tc to reposition onto palm to assist in pushing up to upright seated position with MaxA. Continued poor trunk control with need for Max/ TotA to maintain d/t lean to L and A/P.   Transfers/ NMR: Pt transferred to w/c using STEDY with pt continuing to require step by step verbal instructions prior to attempt as well as throughout for performance completed with MaxA +2.   Pt performed sit<>stand transfer at hallway HR with RUE support and requires MaxA with underarm support to L side for rising to stand and then TotA to rise upright d/t poor trunk control. Then attempted sit<>stand with 3 musketeer support requiring MaxA +2 for poor shoulder and UB support to properly rise to upright posture. Multimodal cues throughout and setup with verbal instructions prior to attempts. Stance times up to with facilitation at  glutes/ low back for more forward pelvic positioning.   NMR performed for improvements in motor control and coordination, balance, sequencing, judgement, and self confidence/ efficacy in performing all aspects of mobility at highest level of independence.   Patient supine in bed at end of session with brakes locked, bed alarm set, and all needs within reach.   Therapy Documentation Precautions:  Precautions Precautions: Fall Precaution Comments: dense L hemi with inability to initiate motor control with R hemibody Restrictions Weight Bearing Restrictions: No General:    Pain:  No pain related by pt this session.   Therapy/Group: Individual Therapy  [name] PT, DPT, CSRS 11/16/2022, 5:52 PM

## 2022-11-16 NOTE — Progress Notes (Signed)
Occupational Therapy Session Note  Patient Details  Name: Samuel Pearson MRN: [id number] Date of Birth: June 30, 1945  Today's Date: 11/16/2022 OT Individual Time: 7829-5621 OT Individual Time Calculation (min): 45 min    Short Term Goals: Week 1:  OT Short Term Goal 1 (Week 1): Pt will be able to maintain static sitting EOB with mod A or less A to enable him to safely sit on a BSC. OT Short Term Goal 2 (Week 1): Pt will demonstrate increased postural awareness to recognize with mod cues when he is leaning/falling back or to his L in unsupported sitting. OT Short Term Goal 3 (Week 1): Pt will demonstrate improved L side awareness to wash left arm with min cues. OT Short Term Goal 4 (Week 1): Pt will be able to self feed with min A.  Skilled Therapeutic Interventions/Progress Updates:    Pt received in bed eating a few bites of breakfast with RN.   Rehab tech arrived to assist with therapy.  Donned TED hose on L foot for edema control.  Donned pants with pt rolling with max A and max cues.  Pt's brief had just been changed by nursing and pt was still dry.   With +2 total A, pt sat to EOB.  At EOB pt needed TOTAL A to hold balance as pt was pushing either to back or L.  Tried to have pt press R hand into bed or hold railing. Pt placed hand but was not using it for support and could not follow directions.  He has a R gaze preference but can scan eyes to L.  Just to try 1 time to see if Samuel Pearson could be an option, had pt pull to stand in stedy lift with max A of 2.  Once up on pads, pt pushing intensely to L and needed total A to prevent falling out of stedy to his L.  Tried to use mirror for feedback and pt recognized that when he moved to left he could no longer see himself in mirror but was unable to hold his position to see himself.  Attempted this positioning for 5 minutes and then returned pt to EOB.   Total A of 2 to move to supine.  Noticed back of his pants were wet.   Rehab tech had to leave so  nurse tech arrived to help.  Doffed pants but his brief was dry and sheets dry. We noticed he was sweating. Donned new pants.  Pt had a fan on other unit but it is not in his room. Will ask nursing for a fan.   At start of session, rehab tech asked a stock market question and about 20 minutes later pt answered his question.  Pt may have been so focused on that question that he was not able to focus on his body positioning.   Pt's LUE positioned on pillows. Resting in bed with all needs met.  Alarm set.    Therapy Documentation Precautions:  Precautions Precautions: Fall Precaution Comments: dense L hemi with inability to initiate motor control with R hemibody Restrictions Weight Bearing Restrictions: No     Pain: Pain Assessment Pain Score: 0-No pain ADL: ADL Eating: Moderate assistance Grooming: Moderate assistance Upper Body Bathing: Moderate assistance Lower Body Bathing: Dependent Upper Body Dressing: Dependent Where Assessed-Upper Body Dressing: Bed level Lower Body Dressing: Dependent Where Assessed-Lower Body Dressing: Bed level Toileting: Unable to assess Toilet Transfer: Unable to assess  Therapy/Group: Individual Therapy  Samuel Pearson 11/16/2022, 10:12 AM

## 2022-11-16 NOTE — Progress Notes (Signed)
Speech Language Pathology Daily Session Note  Patient Details  Name: Samuel Pearson MRN: [id number] Date of Birth: 04/03/1946  Today's Date: 11/16/2022 SLP Individual Time: 1300-1400 SLP Individual Time Calculation (min): 60 min  Short Term Goals: Week 1: SLP Short Term Goal 1 (Week 1): Patient will demonstrate orientation to time with mod multimodal cues SLP Short Term Goal 2 (Week 1): Patient will answer mildly complex problem solving questions with mod multimodal A SLP Short Term Goal 3 (Week 1): Patient will utilize memory compensatory strategies with mod multimodal A SLP Short Term Goal 4 (Week 1): Patient will demonstrate selective/sutained attention for >10 minutes with mod multimodal A SLP Short Term Goal 5 (Week 1): Patient will initiate verbal responses in 75% of opportunities with mod verbal cues SLP Short Term Goal 6 (Week 1): Patient will demonstrate intellectual awareness to deficits with mod multimodal A  Skilled Therapeutic Interventions: Skilled treatment session focused on cognitive re-training. Wife present feeding patient at the beginning of session, noting poor physical ability to self feed. SLP discussed dysphagia 3 diet for ease of mastication/set up, however pt/family reports preferring regular diet at this time. To address processing speed and memory, SLP reviewed WRAP (write, repeat, associate, picture) strategies and explained directions to a card game. SLP aided in processing speed game by providing min verbal A to repeat cues and support verbal responses. Patient would often attempt to place card in a pile without a verbal explanation, requiring SLP to repeat instructions. Noted increased response time over the course of the therapy session, likely due to fatigue. Patient was left in bed with alarm set and call bell in hand. Continue with POC.   Pain Denies Pain  Therapy/Group: Individual Therapy  Decarlos Empey M.A., CF-SLP 11/16/2022, 3:02 PM

## 2022-11-17 DIAGNOSIS — I63521 Cerebral infarction due to unspecified occlusion or stenosis of right anterior cerebral artery: Secondary | ICD-10-CM | POA: Diagnosis not present

## 2022-11-17 NOTE — Progress Notes (Signed)
Occupational Therapy Session Note  Patient Details  Name: Samuel Pearson MRN: [id number] Date of Birth: [birth year]  Today's Date: 11/17/2022 OT Individual Time: 4098-1191 session 1 OT Individual Time Calculation (min): 74 min  Session 2: 4782-9562   Short Term Goals: Week 1:  OT Short Term Goal 1 (Week 1): Pt will be able to maintain static sitting EOB with mod A or less A to enable him to safely sit on a BSC. OT Short Term Goal 2 (Week 1): Pt will demonstrate increased postural awareness to recognize with mod cues when he is leaning/falling back or to his L in unsupported sitting. OT Short Term Goal 3 (Week 1): Pt will demonstrate improved L side awareness to wash left arm with min cues. OT Short Term Goal 4 (Week 1): Pt will be able to self feed with min A.  Skilled Therapeutic Interventions/Progress Updates:   Session 1:  Pt greeted supine in bed. Pt slow to respond but able to state name , day of week and year. Pt agreeable to OT intervention. Pt with no clean pants, retrieved paper pants with pt requiring total A to don with pt needing increased time and max multimodal cues to initiate rolling in bed. Total A for pericare as pt noted to be soiled with urine. Pt completed supine>sit with MAX A - total A +2, pt was able to follow simple commands during bed mobility steps such as reach for rail with increased time for processing, but required total physical assist to elevate trunk into sitting and maneuver LLE to EOB.   Once EOB pt losing balance in all planes with no righting reaction. Utilized stedy to transfer to TIS. Worked on compensatory strategies for mobility such as hooking RLE underneath LLE to position BLEs on stedy and placing R hand over over top of L hand on bar of stedy. Pt required total - MAX A +2 to stand to stedy with pt heavily leaning to L side on perch of stedy.  Once in TIS pt reports that he had urinated in brief, completed same transfer back to bed to change brief  with stedy with a continued emphasis on carrying over steps of stedy transfer and initiating movement.   Remainder of session focused on sitting balance EOB with use of visual feedback with mirror, pt required total A tp maintain balance EOB, pt losing balance in all directions with no righting reactions noted. Pt does state " I know I need to focus" however pt continues to lift up his RLE and remove RUE from bed rail causing pt to lose balance. Ended session with pt supine in bed with bed alarm activated and all needs within reach     Session 2:  Pt greeted supine in bed, pt agreeable to OT intervention. Pt completed supine>sit with MAX A, pt initiates maneuvering RLE to EOB but needs assist with LLE and increased physical assist to elevate trunk. Pt didn't seem to recall steps of technique previously practiced in earlier session. Utilized stedy for OOB transfer with pt needing total - MAX A +2 with heavy leaning to L side and no awareness or concern that pt losing balance.  Transported pt to gym where pt completed squat pivot transfer to EOM to R side. Provided multimodal cues on transfer technique with pt needing total A +2 efforts to transition to EOM. Once EOB tried various hand placement techniques with RUE to facilitate improved sitting balance such as placing hand forward on techs knee or propping up  on R elbow with no technique seeming to work as pt losing balance in all directions but mostly posteriorly and to the Left.               Pt completed additional squat pivot transfer back to w/c to R side with an emphasis on recalling previous transfer steps to facilitate improved initiation of mobility with no recall of previous instructions, total A +2 for squat pivot back to TIS. Transported pt back to room where pt requested to brush his teeth, pt was able to complete oral care with set- up assist.        Ended session with pt up in TIS with all needs within reach and safety alarm activated.   Lift pad in room for pt to be maxi moved back to bed.                   Therapy Documentation Precautions:  Precautions Precautions: Fall Precaution Comments: dense L hemi with inability to initiate motor control with R hemibody Restrictions Weight Bearing Restrictions: No  Pain: No pain reported during either session    Therapy/Group: Individual Therapy  Pollyann Glen [facility] 11/17/2022, 12:21 PM

## 2022-11-17 NOTE — Progress Notes (Signed)
Patient ID: Samuel Pearson, male   DOB: 1945/07/15, 77 y.o.   MRN: [id number]  Team Conference Report to Patient/Family  Team Conference discussion was reviewed with the patient and caregiver, including goals, any changes in plan of care and target discharge date.  Patient and caregiver express understanding and are in agreement.  The patient has a target discharge date of 12/13/22.  SW met with patient and spouse to provide team conference updates. Spouse happy about time offered for patient. Spouse expressed she will spend less time here a the facility to allow therapy and take care of things at home. Sw contact information provided. No additional questions or concerns.  Andria Rhein 11/17/2022, 12:40 PM

## 2022-11-17 NOTE — Progress Notes (Signed)
Speech Language Pathology Daily Session Note  Patient Details  Name: Samuel Pearson MRN: [id number] Date of Birth: 02-07-1946  Today's Date: 11/17/2022 SLP Individual Time: 1300-1400 SLP Individual Time Calculation (min): 60 min  Short Term Goals: Week 1: SLP Short Term Goal 1 (Week 1): Patient will demonstrate orientation to time with mod multimodal cues SLP Short Term Goal 2 (Week 1): Patient will answer mildly complex problem solving questions with mod multimodal A SLP Short Term Goal 3 (Week 1): Patient will utilize memory compensatory strategies with mod multimodal A SLP Short Term Goal 4 (Week 1): Patient will demonstrate selective/sutained attention for >10 minutes with mod multimodal A SLP Short Term Goal 5 (Week 1): Patient will initiate verbal responses in 75% of opportunities with mod verbal cues SLP Short Term Goal 6 (Week 1): Patient will demonstrate intellectual awareness by verbalizing two physical and two cognitive impairments mod multimodal A  Skilled Therapeutic Interventions: Skilled therapy session focused on cognitive re-training. SLP facilitated session by providing mod verbal cues to aid in initiation of verbal responses. Patient able complete generative and categorical naming tasks with ~5-10 second delay. Patient often requiring cues to demonstrate selective attention as he was tangential throughout the session. SLP addressed orientation to time through use of a calendar. With calendar, pt able to answer correct day of the week, date, month and year. SLP to continue to address processing speed through problem solving tasks. Pt left in bed with alarm set and call bell in hand. Wife present. Continue with POC.  Pain Pain Assessment Pain Scale: 0-10 Pain Score: 0-No pain Faces Pain Scale: No hurt  Therapy/Group: Individual Therapy  Emera Bussie M.A., CF-SLP 11/17/2022, 3:09 PM
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
## 2022-11-17 NOTE — Telephone Encounter (Signed)
Loop Recorder Follow up   Is patient connected to [facility]? Yes   Have steri-strips fallen off or been removed?   Does the patient need in office follow up? No   Please continue to monitor your cardiac device site for redness, swelling, and drainage. Call the device clinic at [phone] if you experience these symptoms, fever/chills, or have questions about your device.   Remote monitoring is used to monitor your cardiac device from home. This monitoring is scheduled every month by our office. It allows [place] to keep an eye on the functioning of your device to ensure it is working properly.   Pt is admitted to CIR.  Pt is transmitting remotely.  No further action needed.

## 2022-11-17 NOTE — Progress Notes (Signed)
Physical Therapy Session Note  Patient Details  Name: Samuel Pearson MRN: [id number] Date of Birth: [birth year]  Today's Date: 11/17/2022 PT Individual Time: 4098-1191 PT Individual Time Calculation (min): 29 min   Short Term Goals: Week 1:  PT Short Term Goal 1 (Week 1): Pt will perform all aspects of bedmobility with consistent MaxA. PT Short Term Goal 2 (Week 1): Pt will perform sit<>stand with MaxA +1 PT Short Term Goal 3 (Week 1): Pt will perform seat to seat transfers with MaxA +1 PT Short Term Goal 4 (Week 1): Pt will initiate sitting balance and maintain with MaxA+1. PT Short Term Goal 5 (Week 1): Pt will initiate gait training.  Skilled Therapeutic Interventions/Progress Updates: Patient supine with wife present on entrance to room. Patient alert and agreeable to PT session.   Pt required personal care assistance due to soiled brief. Personal care initiated with rehab tech present throughout.. Pt totalA to transition to R sidelying in order to provide pericare. Pt then supine to L sidelying with maxA and multimodal cues for pt pull self over via rail and use of R UE (pt provided with increased time to process request with slight initiation assistance to reach for railing), and to flex at R knee in order to bridge R hips over to side. Chuck pad and bed sheet replaced as it was soiled as well. Pt noted to have BM and urin void. Pt performed several side rolls for posterior/anterior pericare with same assistance and multimodal cues required. Pt noted to have increased HS contraction despite cues to relax R LE in order to thread new hospital pants through (increased time to process, but pt eventually relaxed R LE). Pt cued to lift L LE with cues to visually look and think about movement in order to increase neuromuscular connection. Pt then cued to use R UE to finish donning hospital pants from mid thighs. When PTA cued patient to dig R foot into bed in order to elevate R hips, pt stated "you  mean a bridge?" Pt performed slight bridge on R with maxA to finish donning pants on R, and totalA on L.   Patient supine in bed at end of session with brakes locked, bed alarm set, wife present and all needs within reach.      Therapy Documentation Precautions:  Precautions Precautions: Fall Precaution Comments: dense L hemi with inability to initiate motor control with R hemibody Restrictions Weight Bearing Restrictions: No  Therapy/Group: Individual Therapy  Cleora Karnik PTA 11/17/2022, 4:27 PM

## 2022-11-18 MED ORDER — METHYLPHENIDATE HCL 5 MG PO TABS
5.0000 mg | ORAL_TABLET | Freq: Two times a day (BID) | ORAL | Status: DC
  Administered 2022-11-18 – 2022-12-01 (×24): 5 mg via ORAL
  Filled 2022-11-18 (×25): qty 1

## 2022-11-18 NOTE — Progress Notes (Signed)
Occupational Therapy Session Note  Patient Details  Name: Samuel Pearson MRN: [id number] Date of Birth: 1946-02-11  Today's Date: 11/18/2022 OT Individual Time: 3086-5784 and 1030-1100 OT Individual Time Calculation (min): 45 min and 30 min    Short Term Goals: Week 1:  OT Short Term Goal 1 (Week 1): Pt will be able to maintain static sitting EOB with mod A or less A to enable him to safely sit on a BSC. OT Short Term Goal 2 (Week 1): Pt will demonstrate increased postural awareness to recognize with mod cues when he is leaning/falling back or to his L in unsupported sitting. OT Short Term Goal 3 (Week 1): Pt will demonstrate improved L side awareness to wash left arm with min cues. OT Short Term Goal 4 (Week 1): Pt will be able to self feed with min A.  Skilled Therapeutic Interventions/Progress Updates:    Visit 1: no c/o pain  Pt received in bed with his wife arriving shortly afterwards.  Pt's brief was dry and assisted pt with donning pants total A.  Total A with rolling and +2 total A to sit up to EOB and then to hold sit position with heavy max A.  Attempted a sit to stand 3 x using Corene Cornea mechanical lift.  Pt unable to initiate leg extension and his torso kept sliding down through the sling.  Moved pt back to supine +2 and then to [facility] to be placed in the chair position to engage in a dynamic reaching activity.   Max cues and assist to extend reach to his R.  Pt moves arm but does not initiate with torso.  With tactile cues pt was able to improve his reach. He had to move cones from R to L side of body to work on L side awareness.   Pt had recall of a need I had on Wednesday.  He remembered it and brought it up!    Pt able to recall recent events and is demonstrating some improved intellectual awareness into his condition.  Pt resting in bed with alarm set.  Visit 2: no c/o pain  Pt seen this session for continued practice with reaching to R.  Pt moving extremely slowly.  Pt stated  "the therapists are trying to get me to move faster".  Used a counting cue ("on count of 4 grab the cone") this did help pt quite a bit with engagement.  Discussed with family and pt use of music for motivation and what kinds of music he likes.   LUE hand over hand guiding with towel slides on slide board.  Pt was able to explain to his daughter (she and granddtr arrived mid session) what he was working on and why accurately after I had explained it to him.   Pt resting in bed with alarm set and all needs met.   Therapy Documentation Precautions:  Precautions Precautions: Fall Precaution Comments: dense L hemi with inability to initiate motor control with R hemibody Restrictions Weight Bearing Restrictions: No   Pain: Pain Assessment Pain Scale: 0-10 Pain Score: 0-No pain Faces Pain Scale: No hurt    Therapy/Group: Individual Therapy  Quanasia Defino 11/18/2022, 11:16 AM

## 2022-11-18 NOTE — Progress Notes (Signed)
Physical Therapy Session Note  Patient Details  Name: Samuel Pearson MRN: [id number] Date of Birth: 01/07/1946  Today's Date: 11/18/2022 PT Individual Time: 2956-2130 PT Individual Time Calculation (min): 81 min   Short Term Goals: Week 1:  PT Short Term Goal 1 (Week 1): Pt will perform all aspects of bedmobility with consistent MaxA. PT Short Term Goal 2 (Week 1): Pt will perform sit<>stand with MaxA +1 PT Short Term Goal 3 (Week 1): Pt will perform seat to seat transfers with MaxA +1 PT Short Term Goal 4 (Week 1): Pt will initiate sitting balance and maintain with MaxA+1. PT Short Term Goal 5 (Week 1): Pt will initiate gait training. Week 2:     Skilled Therapeutic Interventions/Progress Updates:      - reaching out foobject held forward with pt able to lean forward to reach but unable to return to upright seated position and leans to L.  - reaching directly to R for ball with pt able to inititiate to R side when starting from midline (with MaxA). Enough assist provided to allow pt to lean to R but not to return to L.   - At one point, pt fatigued and unable to focus, but provided with countown to 5 and told pt to reach cone by the time countdown reaches 5. Pt gives max effort after 5 and is able to reach cone. 2 more attempts with variable results but some R trunk activation noted. Pt even relates feeling as though HE used the correct muscles.   Therapy Documentation Precautions:  Precautions Precautions: Fall Precaution Comments: dense L hemi with inability to initiate motor control with R hemibody Restrictions Weight Bearing Restrictions: No General:   Vital Signs: Therapy Vitals Temp: 98.3 F (36.8 C) Pulse Rate: 88 Resp: 19 BP: (!) 144/57 Patient Position (if appropriate): Lying Oxygen Therapy SpO2: 95 % O2 Device: Room Air Pain:   Mobility:   Locomotion :    Trunk/Postural Assessment :    Balance:   Exercises:   Other Treatments:       Therapy/Group: Individual Therapy  [name] PT, DPT, CSRS 11/18/2022, 5:06 PM

## 2022-11-18 NOTE — Progress Notes (Signed)
PROGRESS NOTE   Subjective/Complaints:  No issues overnite Pt able to feed himself with set up , delayed responses   ROS- neg CP, SOB, N/V , limited by cognition   Objective:   No results found. No results for input(s): "WBC", "HGB", "HCT", "PLT" in the last 72 hours.  No results for input(s): "NA", "K", "CL", "CO2", "GLUCOSE", "BUN", "CREATININE", "CALCIUM" in the last 72 hours.   Intake/Output Summary (Last 24 hours) at 11/18/2022 0730 Last data filed at 11/17/2022 1830 Gross per 24 hour  Intake 460 ml  Output --  Net 460 ml        Physical Exam: Vital Signs Blood pressure (!) 151/44, pulse 72, temperature 98.2 F (36.8 C), resp. rate 18, SpO2 95%.   General: No acute distress Mood and affect are appropriate Heart: Regular rate and rhythm no rubs murmurs or extra sounds S/p loop Lungs: Clear to auscultation, breathing unlabored, no rales or wheezes Abdomen: Positive bowel sounds, soft nontender to palpation, moderately distended Extremities: No clubbing, cyanosis, or edema, no calf tenderness or swelling on left side  Skin: No evidence of breakdown, no evidence of rash Neurologic: markedly delayed responses but oriented to person , place and situation , motor strength is 5/5 in RIght and 0/5 left deltoid, bicep, tricep, grip, hip flexor, knee extensors, ankle dorsiflexor and plantar flexor Sensory exam normal sensation to pinch in bilateral upper and lower extremities  Musculoskeletal: Full range of motion in all 4 extremities. No joint swelling   Assessment/Plan: 1. Functional deficits which require 3+ hours per day of interdisciplinary therapy in a comprehensive inpatient rehab setting. Physiatrist is providing close team supervision and 24 hour management of active medical problems listed below. Physiatrist and rehab team continue to assess barriers to discharge/monitor patient progress toward functional  and medical goals  Care Tool:  Bathing    Body parts bathed by patient: Chest, Abdomen, Face   Body parts bathed by helper: Left arm, Right arm, Front perineal area, Buttocks, Left upper leg, Right upper leg, Right lower leg, Left lower leg     Bathing assist Assist Level: Maximal Assistance - Patient 24 - 49%     Upper Body Dressing/Undressing Upper body dressing   What is the patient wearing?: Pull over shirt    Upper body assist Assist Level: 2 Helpers    Lower Body Dressing/Undressing Lower body dressing      What is the patient wearing?: Incontinence brief, Pants     Lower body assist Assist for lower body dressing: 2 Helpers     Toileting Toileting    Toileting assist Assist for toileting: Dependent - Patient 0%     Transfers Chair/bed transfer  Transfers assist  Chair/bed transfer activity did not occur: Safety/medical concerns  Chair/bed transfer assist level: Dependent - mechanical lift     Locomotion Ambulation   Ambulation assist   Ambulation activity did not occur: Safety/medical concerns          Walk 10 feet activity   Assist  Walk 10 feet activity did not occur: Safety/medical concerns        Walk 50 feet activity   Assist Walk 50 feet with 2  turns activity did not occur: Safety/medical concerns         Walk 150 feet activity   Assist Walk 150 feet activity did not occur: Safety/medical concerns         Walk 10 feet on uneven surface  activity   Assist Walk 10 feet on uneven surfaces activity did not occur: Safety/medical concerns         Wheelchair     Assist Is the patient using a wheelchair?: Yes Type of Wheelchair: Manual Wheelchair activity did not occur: Safety/medical concerns         Wheelchair 50 feet with 2 turns activity    Assist    Wheelchair 50 feet with 2 turns activity did not occur: Safety/medical concerns       Wheelchair 150 feet activity     Assist  Wheelchair  150 feet activity did not occur: Safety/medical concerns       Blood pressure (!) 151/44, pulse 72, temperature 98.2 F (36.8 C), resp. rate 18, SpO2 95%.  Medical Problem List and Plan: 1. 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  Antithrombotics: -DVT/anticoagulation:  Pharmaceutical:             left tib and peroneal DVTs, started on Eliquis, pt asymptomatic , discussed option of monitoring on lovenox 40mg   sq with serial dopplers               -antiplatelet therapy: Aspirin and Plavix for three weeks followed by Plavix alone.  Last dose of aspirin will be 7/29   3. Pain Management: Tylenol as needed   4. Mood/Behavior/Sleep: LCSW to evaluate and provide emotional support             -antipsychotic agents: n/a Add low dose ritalin for initiation issues    5. Neuropsych/cognition: This patient is not capable of making decisions on his own behalf.             -pt has hx of ETOH abuse, 2-3 beers + wine most nights 6. Skin/Wound Care: Routine skin care checks   7. Fluids/Electrolytes/Nutrition: Routine Is and Os and follow-up chemistries in AM             -continue MVI, thiamine   8: Hypertension: monitor TID and prn (home meds: lisinopril, Lopressor)             -continue amlodipine 5 mg daily              9: Hyperlipidemia: continue statin   10: Prediabetes: ? diet controlled; A1c = 5.7%   11: Macrocytic anemia/B12 deficiency: follow-up CBC             -hx of pernicious anemia; PCP rec: daily B12 12. Ileus:             -moving bowels now. Had bm this morning             -senna-s at bedtime             -PPI, prn simethicone 13. 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 Thyroid nodule-TFT's essentially WNL             -f/u as outpt, thyroid U/S, etc.     LOS: 4 days A FACE TO FACE EVALUATION WAS PERFORMED  Samuel Pearson 11/18/2022, 7:30 AM

## 2022-11-18 NOTE — Plan of Care (Signed)
Progressing towards goals

## 2022-11-18 NOTE — Progress Notes (Signed)
Speech Language Pathology Daily Session Note  Patient Details  Name: Samuel Pearson MRN: [id number] Date of Birth: 02-08-1946  Today's Date: 11/18/2022 SLP Individual Time: 1101-1201 SLP Individual Time Calculation (min): 60 min  Short Term Goals: Week 1: SLP Short Term Goal 1 (Week 1): Patient will demonstrate orientation to time with mod multimodal cues SLP Short Term Goal 2 (Week 1): Patient will answer mildly complex problem solving questions with mod multimodal A SLP Short Term Goal 3 (Week 1): Patient will utilize memory compensatory strategies with mod multimodal A SLP Short Term Goal 4 (Week 1): Patient will demonstrate selective/sutained attention for >10 minutes with mod multimodal A SLP Short Term Goal 5 (Week 1): Patient will initiate verbal responses in 75% of opportunities with mod verbal cues SLP Short Term Goal 6 (Week 1): Patient will demonstrate intellectual awareness by verbalizing two physical and two cognitive impairments mod multimodal A  Skilled Therapeutic Interventions: Skilled therapy session focused on cognitive goals. SLP facilitated session by providing mod verbal and tactile cues to complete money calculations. Patient able to count simple calculations with ease, however required increased cues for more complex calculations of >$1.00. Noted left inattention this date, as patient required cues to utilize change located on the L side of the table. SLP addressed verbal initiation and functional problem solving through discussion of stocks. SLP provided patient with a $1,000 stock budget and patient was able to utilize budget with mod verbal and visual cues. Patient with increased timing of verbal responses this date, possibly due to functionality of task. Patient was left in bed with family present and call bell in reach. Continue with POC.   Pain Pain Assessment Pain Scale: 0-10 Pain Score: 0-No pain Faces Pain Scale: No hurt  Therapy/Group: Individual  Therapy  Anaiya Wisinski M.A., CF-SLP 11/18/2022, 12:24 PM

## 2022-11-19 NOTE — Progress Notes (Signed)
Speech Language Pathology Daily Session Note  Patient Details  Name: Samuel Pearson MRN: [id number] Date of Birth: 06/02/1945  Today's Date: 11/19/2022 SLP Individual Time: 7829-5621 SLP Individual Time Calculation (min): 55 min  Short Term Goals: Week 1: SLP Short Term Goal 1 (Week 1): Patient will demonstrate orientation to time with mod multimodal cues SLP Short Term Goal 2 (Week 1): Patient will answer mildly complex problem solving questions with mod multimodal A SLP Short Term Goal 3 (Week 1): Patient will utilize memory compensatory strategies with mod multimodal A SLP Short Term Goal 4 (Week 1): Patient will demonstrate selective/sutained attention for >10 minutes with mod multimodal A SLP Short Term Goal 5 (Week 1): Patient will initiate verbal responses in 75% of opportunities with mod verbal cues SLP Short Term Goal 6 (Week 1): Patient will demonstrate intellectual awareness by verbalizing two physical and two cognitive impairments mod multimodal A  Skilled Therapeutic Interventions: Skilled treatment session focused on cognitive goals. Upon arrival, patient was awake in bed but appeared lethargic. SLP facilitated session by providing Min A verbal cues and extra time during a functional reading task that focused on attention, left visual scanning, and working memory. Patient with poor awareness of errors and required Mod verbal cues to self-monitor and correct errors. SLP also provided patient with a mildly complex task in which patient had to identify medication administration errors in a BID pill box. Extra time and overall Mod verbal cues were needed for initiation, attention, and left visual scanning with only Min verbal cues for problem solving. As session progressed, patient required more time for verbal responses and started utilizing gestures to communicate with encouragement needed for verbalizations, suspect due to fatigue. Patient left upright in bed with alarm on and all  needs within reach. Continue with current plan of care.      Pain No/Denies Pain   Therapy/Group: Individual Therapy  Aalaya Yadao 11/19/2022, 12:50 PM

## 2022-11-19 NOTE — Progress Notes (Signed)
PROGRESS NOTE   Subjective/Complaints:  Pt up in bed. Feeding himself yogurt with blueberries. Appears comfortable. Denied any problems.   ROS: Limited due to cognitive/behavioral. Slow to respond and process  Objective:   No results found. No results for input(s): "WBC", "HGB", "HCT", "PLT" in the last 72 hours.  No results for input(s): "NA", "K", "CL", "CO2", "GLUCOSE", "BUN", "CREATININE", "CALCIUM" in the last 72 hours.   Intake/Output Summary (Last 24 hours) at 11/19/2022 1203 Last data filed at 11/19/2022 0742 Gross per 24 hour  Intake 320 ml  Output --  Net 320 ml        Physical Exam: Vital Signs Blood pressure (!) 137/49, pulse 69, temperature 97.7 F (36.5 C), temperature source Oral, resp. rate 18, SpO2 96%.   Constitutional: No distress . Vital signs reviewed. HEENT: NCAT, EOMI, oral membranes moist Neck: supple Cardiovascular: RRR without murmur. No JVD    Respiratory/Chest: CTA Bilaterally without wheezes or rales. Normal effort    GI/Abdomen: BS +, non-tender, non-distended Ext: no clubbing, cyanosis, or edema Psych: pleasant and cooperative  Skin: No evidence of breakdown, no evidence of rash Neurologic: continues with delayed responses but oriented to person , place and situation , can respond to basic questions with extra time. motor strength is 5/5 in RIght and 0/5 left deltoid, bicep, tricep, grip, hip flexor, knee extensors, ankle dorsiflexor and plantar flexor but very deliberate. Sensory exam normal sensation to pinch in bilateral upper and lower extremities  Musculoskeletal: Full range of motion in all 4 extremities. No joint swelling   Assessment/Plan: 1. Functional deficits which require 3+ hours per day of interdisciplinary therapy in a comprehensive inpatient rehab setting. Physiatrist is providing close team supervision and 24 hour management of active medical problems listed  below. Physiatrist and rehab team continue to assess barriers to discharge/monitor patient progress toward functional and medical goals  Care Tool:  Bathing    Body parts bathed by patient: Chest, Abdomen, Face   Body parts bathed by helper: Left arm, Right arm, Front perineal area, Buttocks, Left upper leg, Right upper leg, Right lower leg, Left lower leg     Bathing assist Assist Level: Maximal Assistance - Patient 24 - 49%     Upper Body Dressing/Undressing Upper body dressing   What is the patient wearing?: Pull over shirt    Upper body assist Assist Level: 2 Helpers    Lower Body Dressing/Undressing Lower body dressing      What is the patient wearing?: Incontinence brief, Pants     Lower body assist Assist for lower body dressing: 2 Helpers     Toileting Toileting    Toileting assist Assist for toileting: Dependent - Patient 0%     Transfers Chair/bed transfer  Transfers assist  Chair/bed transfer activity did not occur: Safety/medical concerns  Chair/bed transfer assist level: Dependent - mechanical lift     Locomotion Ambulation   Ambulation assist   Ambulation activity did not occur: Safety/medical concerns          Walk 10 feet activity   Assist  Walk 10 feet activity did not occur: Safety/medical concerns        Walk 50  feet activity   Assist Walk 50 feet with 2 turns activity did not occur: Safety/medical concerns         Walk 150 feet activity   Assist Walk 150 feet activity did not occur: Safety/medical concerns         Walk 10 feet on uneven surface  activity   Assist Walk 10 feet on uneven surfaces activity did not occur: Safety/medical concerns         Wheelchair     Assist Is the patient using a wheelchair?: Yes Type of Wheelchair: Manual Wheelchair activity did not occur: Safety/medical concerns         Wheelchair 50 feet with 2 turns activity    Assist    Wheelchair 50 feet with 2  turns activity did not occur: Safety/medical concerns       Wheelchair 150 feet activity     Assist  Wheelchair 150 feet activity did not occur: Safety/medical concerns       Blood pressure (!) 137/49, pulse 69, temperature 97.7 F (36.5 C), temperature source Oral, resp. rate 18, SpO2 96%.  Medical Problem List and Plan: 1. 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  Antithrombotics: -DVT/anticoagulation:  Pharmaceutical:             left tib and peroneal DVTs, started on Eliquis, pt asymptomatic , discussed option of monitoring on lovenox 40mg   sq with serial dopplers               -antiplatelet therapy: Aspirin and Plavix for three weeks followed by Plavix alone.  Last dose of aspirin will be 7/29   3. Pain Management: Tylenol as needed   4. Mood/Behavior/Sleep: LCSW to evaluate and provide emotional support             -antipsychotic agents: n/a Add low dose ritalin for initiation issues    5. Neuropsych/cognition: This patient is not capable of making decisions on his own behalf.             -pt has hx of ETOH abuse, 2-3 beers + wine most nights 6. Skin/Wound Care: Routine skin care checks   7. Fluids/Electrolytes/Nutrition: Routine Is and Os and follow-up chemistries in AM             -continue MVI, thiamine   8: Hypertension: monitor TID and prn (home meds: lisinopril, Lopressor)             -continue amlodipine 5 mg daily              9: Hyperlipidemia: continue statin   10: Prediabetes: ? diet controlled; A1c = 5.7%   11: Macrocytic anemia/B12 deficiency: follow-up CBC             -hx of pernicious anemia; PCP rec: daily B12 12. Ileus:              -moving bowels now. Last BM 7/12             -senna-s at bedtime             -  PPI, prn simethicone 13. 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 Thyroid nodule-TFT's essentially WNL             -f/u as outpt, thyroid U/S, etc.     LOS: 5 days A FACE TO FACE EVALUATION WAS PERFORMED  Ranelle Oyster 11/19/2022, 12:03 PM

## 2022-11-19 NOTE — Progress Notes (Signed)
Occupational Therapy Session Note  Patient Details  Name: Samuel Pearson MRN: [id number] Date of Birth: January 31, 1946  Today's Date: 11/19/2022 OT Individual Time: 4098-1191 and 1445-1530 OT Individual Time Calculation (min): 43 min and 45 min    Short Term Goals: Week 1:  OT Short Term Goal 1 (Week 1): Pt will be able to maintain static sitting EOB with mod A or less A to enable him to safely sit on a BSC. OT Short Term Goal 2 (Week 1): Pt will demonstrate increased postural awareness to recognize with mod cues when he is leaning/falling back or to his L in unsupported sitting. OT Short Term Goal 3 (Week 1): Pt will demonstrate improved L side awareness to wash left arm with min cues. OT Short Term Goal 4 (Week 1): Pt will be able to self feed with min A.  Skilled Therapeutic Interventions/Progress Updates:    Visit 1: no c/o pain Pt received in bed and agreeable to therapy. His responses were extremely delayed and infrequent. Pt had good visual attention to therapist and rehab tech but could not answer questions in a timely manner but he did make comments about other things occasionally. To get out of bed, total A of 2 to sit to EOB and hold his balance as he could not follow directions to use R hand to pull himself to the center and was pushing severely to his L.   Used the sara stedy mechanical lift with the leg harness straps. This was improved over the waist strap as it prevented his hips from pushing back but he did push upper torso back. He was not able to push to the left as much so this was safer than the regular stedy.   Pt transferred to wc/  Spent this session working on reaching to R to offset L lean with max cues and assist using various strategies of simple 1 step activity and music.    At end of session pt resting in TIS w/c with belt alarm on and call light in reach.   Visit 2: no c/o pain  Pt taken to therapy to continue R reaching activities.  Attempted having pt use R  hand with wash cloth in hand to do wall slides, but pt not able to follow 1 step instruction.  Placed pt's chair adjacent to table with R arm resting on table for visual/proprioceptive feedback. He then had to reach R hand further to the R and then place arm back on table.  Pt moving very very slowly and delayed.  Used stop watch to have him reach for 5 targets within 30 seconds then kept decreasing time to 20 sec then 15 seconds.  He did well first 3 trials but then lost his focus.   Changed his position to have L arm on table for support and reaching R to L.   Continues to struggle with severe delays.   Used sara stedy to transfer back to bed with +2 total A.  Resting in bed with all needs met, alarm set. Wife present and sitting with patient.    Therapy Documentation Precautions:  Precautions Precautions: Fall Precaution Comments: dense L hemi with inability to initiate motor control with R hemibody Restrictions Weight Bearing Restrictions: No    Vital Signs: Therapy Vitals Temp: 98.5 F (36.9 C) Pulse Rate: 86 Resp: 17 BP: (!) 132/57 Patient Position (if appropriate): Lying Oxygen Therapy SpO2: 95 % O2 Device: Room Air    ADL: ADL Eating: Moderate assistance Grooming:  Moderate assistance Upper Body Bathing: Moderate assistance Lower Body Bathing: Dependent Upper Body Dressing: Dependent Where Assessed-Upper Body Dressing: Bed level Lower Body Dressing: Dependent Where Assessed-Lower Body Dressing: Bed level Toileting: Unable to assess Toilet Transfer: Unable to assess   Therapy/Group: Individual Therapy  Samuel Pearson 11/19/2022, 1:03 PM

## 2022-11-19 NOTE — Progress Notes (Signed)
Physical Therapy Session Note  Patient Details  Name: Samuel Pearson MRN: [id number] Date of Birth: Dec 24, 1945  Today's Date: 11/19/2022 PT Individual Time:  0927-1030  PT Individual Time Calculation (min): 63 min  Short Term Goals: Week 1:  PT Short Term Goal 1 (Week 1): Pt will perform all aspects of bedmobility with consistent MaxA. PT Short Term Goal 2 (Week 1): Pt will perform sit<>stand with MaxA +1 PT Short Term Goal 3 (Week 1): Pt will perform seat to seat transfers with MaxA +1 PT Short Term Goal 4 (Week 1): Pt will initiate sitting balance and maintain with MaxA+1. PT Short Term Goal 5 (Week 1): Pt will initiate gait training.   Skilled Therapeutic Interventions/Progress Updates:  Patient supine in bed on entrance to room. Patient alert and agreeable to PT session. Bed noted to be wet with sweat and UI.   Patient with no pain complaint at start of session.  Therapeutic Activity: Bed Mobility: Pt performed rolling to each side with vc for sequencing proper technique to each side. Max/TotA to L with hold from +2 in sidelying position. Requires significant vc to initiate reach to bedrail with RUE and focus on task. Roll to R side with TotA +2.  Transfers: Pt performed sit<>stand using STEDY with same cues for bringing R hand to bar, bending elbow for proper PULL at bar as pt likes to straighten arm to push, and using RLE to stand. Pt allowed to cue counting for effort to stand on "3". VC for initiation. Completes with Mod/ MaxA. In returning to bed at end of session, pt is able to control descent to sit with vc of slow reach back to bed and pt using RUE  Neuromuscular Re-ed: NMR facilitated during session with focus on sitting balance and midline orientation following forward and L lean. Pt guided in anterior and R side reaching in order to improve motor control of unaffected side of trunk for improved overall trunk control. VC and visual cue provided for encouragement using pt's  toothbrush as goal item to reach as he requests to brush teeth. +2 available for safety in LOB to L. Encouraged pt to reach with arm and then lean trunk to R in order to reach. Even cued pt verbally with instructions to get toothbrush by the time therapist has counted to "4". Pt unable this day to reach on own and requires ModA to lean trunk to right following pt's activation in reach and then finally reach toothbrush. Pushed to sink and with functional activity requiring spit into basin, pt produces very slight forward movement of trunk to reach. Brushes teeth with MaxA for setup of toothbrush and then is able to brush teeth with supervision.   NMR performed for improvements in motor control and coordination, balance, sequencing, judgement, and self confidence/ efficacy in performing all aspects of mobility at highest level of independence.    Patient supine in bed at end of session with brakes locked, bed alarm set, and all needs within reach. Wife present. Oriented pt to time and time of next therapy session with SLP.    Therapy Documentation Precautions:  Precautions Precautions: Fall Precaution Comments: dense L hemi with inability to initiate motor control with R hemibody Restrictions Weight Bearing Restrictions: No General:   Vital Signs:   Pain:  No relation of pain throughout session.   Therapy/Group: Individual Therapy  Samuel Pearson PT, DPT, CSRS 11/19/2022, 6:47 PM

## 2022-11-20 ENCOUNTER — Inpatient Hospital Stay (HOSPITAL_COMMUNITY): Payer: Medicare Other

## 2022-11-20 DIAGNOSIS — I82402 Acute embolism and thrombosis of unspecified deep veins of left lower extremity: Secondary | ICD-10-CM | POA: Diagnosis not present

## 2022-11-20 NOTE — Progress Notes (Signed)
VASCULAR LAB    Left lower extremity venous duplex has been performed.  See CV proc for preliminary results.   Kamau Weatherall, RVT 11/20/2022, 9:03 AM

## 2022-11-20 NOTE — Progress Notes (Signed)
PROGRESS NOTE   Subjective/Complaints:  Pt watching tv. No complaints. Indicates he's comfortable.   ROS: Limited due to cognitive/behavioral   Objective:   No results found. No results for input(s): "WBC", "HGB", "HCT", "PLT" in the last 72 hours.  No results for input(s): "NA", "K", "CL", "CO2", "GLUCOSE", "BUN", "CREATININE", "CALCIUM" in the last 72 hours.   Intake/Output Summary (Last 24 hours) at 11/20/2022 0824 Last data filed at 11/20/2022 0756 Gross per 24 hour  Intake 717 ml  Output --  Net 717 ml        Physical Exam: Vital Signs Blood pressure (!) 157/63, pulse 78, temperature 98.2 F (36.8 C), temperature source Oral, resp. rate 18, SpO2 96%.   Constitutional: No distress . Vital signs reviewed. HEENT: NCAT, EOMI, oral membranes moist Neck: supple Cardiovascular: RRR without murmur. No JVD    Respiratory/Chest: CTA Bilaterally without wheezes or rales. Normal effort    GI/Abdomen: BS +, non-tender, non-distended Ext: no clubbing, cyanosis, or edema Psych: pleasant and cooperative  Skin: No evidence of breakdown, no evidence of rash Neurologic: continues with delayed responses but oriented to person , place and situation , can respond to basic questions with extra time. motor strength is 5/5 in RIght and 0/5 left deltoid, bicep, tricep, grip, hip flexor, knee extensors, ankle dorsiflexor and plantar flexor but very deliberate. Sensory exam normal sensation to pinch in bilateral upper and lower extremities  Musculoskeletal: Full range of motion in all 4 extremities. No joint swelling   Assessment/Plan: 1. Functional deficits which require 3+ hours per day of interdisciplinary therapy in a comprehensive inpatient rehab setting. Physiatrist is providing close team supervision and 24 hour management of active medical problems listed below. Physiatrist and rehab team continue to assess barriers to  discharge/monitor patient progress toward functional and medical goals  Care Tool:  Bathing    Body parts bathed by patient: Chest, Abdomen, Face   Body parts bathed by helper: Left arm, Right arm, Front perineal area, Buttocks, Left upper leg, Right upper leg, Right lower leg, Left lower leg     Bathing assist Assist Level: Maximal Assistance - Patient 24 - 49%     Upper Body Dressing/Undressing Upper body dressing   What is the patient wearing?: Pull over shirt    Upper body assist Assist Level: 2 Helpers    Lower Body Dressing/Undressing Lower body dressing      What is the patient wearing?: Incontinence brief, Pants     Lower body assist Assist for lower body dressing: 2 Helpers     Toileting Toileting    Toileting assist Assist for toileting: Dependent - Patient 0%     Transfers Chair/bed transfer  Transfers assist  Chair/bed transfer activity did not occur: Safety/medical concerns  Chair/bed transfer assist level: Dependent - mechanical lift     Locomotion Ambulation   Ambulation assist   Ambulation activity did not occur: Safety/medical concerns          Walk 10 feet activity   Assist  Walk 10 feet activity did not occur: Safety/medical concerns        Walk 50 feet activity   Assist Walk 50 feet with 2  turns activity did not occur: Safety/medical concerns         Walk 150 feet activity   Assist Walk 150 feet activity did not occur: Safety/medical concerns         Walk 10 feet on uneven surface  activity   Assist Walk 10 feet on uneven surfaces activity did not occur: Safety/medical concerns         Wheelchair     Assist Is the patient using a wheelchair?: Yes Type of Wheelchair: Manual Wheelchair activity did not occur: Safety/medical concerns         Wheelchair 50 feet with 2 turns activity    Assist    Wheelchair 50 feet with 2 turns activity did not occur: Safety/medical concerns        Wheelchair 150 feet activity     Assist  Wheelchair 150 feet activity did not occur: Safety/medical concerns       Blood pressure (!) 157/63, pulse 78, temperature 98.2 F (36.8 C), temperature source Oral, resp. rate 18, SpO2 96%.  Medical Problem List and Plan: 1. 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  Antithrombotics: -DVT/anticoagulation:  Pharmaceutical:             left tib and peroneal DVTs, started on Eliquis, pt asymptomatic , discussed option of monitoring on lovenox 40mg   sq with serial dopplers               -antiplatelet therapy: Aspirin and Plavix for three weeks followed by Plavix alone.  Last dose of aspirin will be 7/29   3. Pain Management: Tylenol as needed   4. Mood/Behavior/Sleep: LCSW to evaluate and provide emotional support             -antipsychotic agents: n/a Add low dose ritalin for initiation issues    5. Neuropsych/cognition: This patient is not capable of making decisions on his own behalf.             -pt has hx of ETOH abuse, 2-3 beers + wine most nights 6. Skin/Wound Care: Routine skin care checks   7. Fluids/Electrolytes/Nutrition: Routine Is and Os and follow-up chemistries Monday             -continue MVI, thiamine   8: Hypertension: monitor TID and prn (home meds: lisinopril, Lopressor)             -continue amlodipine 5 mg daily              9: Hyperlipidemia: continue statin   10: Prediabetes: ? diet controlled; A1c = 5.7%   11: Macrocytic anemia/B12 deficiency: follow-up CBC             -hx of pernicious anemia; PCP rec: daily B12 12. Ileus:             -moving bowels now. Last BM 7/12              -senna-s at bedtime             -  PPI, prn simethicone 13. 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 Thyroid nodule-TFT's essentially WNL             -f/u as outpt, thyroid U/S, etc.     LOS: 6 days A FACE TO FACE EVALUATION WAS PERFORMED  Ranelle Oyster 11/20/2022, 8:24 AM

## 2022-11-21 LAB — CBC
HCT: 40.6 % (ref 39.0–52.0)
Hemoglobin: 13.6 g/dL (ref 13.0–17.0)
MCH: 32.7 pg (ref 26.0–34.0)
MCHC: 33.5 g/dL (ref 30.0–36.0)
MCV: 97.6 fL (ref 80.0–100.0)
Platelets: 283 10*3/uL (ref 150–400)
RBC: 4.16 MIL/uL — ABNORMAL LOW (ref 4.22–5.81)
RDW: 12.7 % (ref 11.5–15.5)
WBC: 9.7 10*3/uL (ref 4.0–10.5)
nRBC: 0 % (ref 0.0–0.2)

## 2022-11-21 LAB — BASIC METABOLIC PANEL
Anion gap: 9 (ref 5–15)
BUN: 15 mg/dL (ref 8–23)
CO2: 22 mmol/L (ref 22–32)
Calcium: 8.8 mg/dL — ABNORMAL LOW (ref 8.9–10.3)
Chloride: 104 mmol/L (ref 98–111)
Creatinine, Ser: 0.72 mg/dL (ref 0.61–1.24)
GFR, Estimated: >60 mL/min (ref >60)
Glucose, Bld: 118 mg/dL — ABNORMAL HIGH (ref 70–99)
Potassium: 3.9 mmol/L (ref 3.5–5.1)
Sodium: 135 mmol/L (ref 135–145)

## 2022-11-21 NOTE — Progress Notes (Signed)
PROGRESS NOTE   Subjective/Complaints:  More alert today , response time still delayed but mildly improved  Discussed with SLP no swallowing issues   ROS: Limited due to cognitive/behavioral   Objective:   VAS [place] LOWER EXTREMITY VENOUS (DVT)  Result Date: 11/20/2022  Lower Venous DVT Study Patient Name:  Samuel Pearson  Date of Exam:   11/20/2022 Medical Rec #: [id number]         Accession #:    [id number] Date of Birth: 24-Nov-1945         Patient Gender: M Patient Age:   77 years Exam Location:  [facility] Procedure:      VAS [place] LOWER EXTREMITY VENOUS (DVT) Referring Phys: Wendi Maya --------------------------------------------------------------------------------  Indications: F/U DVT found 11/15/2022 in the left gastroc and peroneal veins.  Risk Factors: Immobility secondary to stroke, patient in rehab Comparison Study: Prior bilateral LEV done 11/15/2022 Performing Technologist: Sherren Kerns RVS  Examination Guidelines: A complete evaluation includes B-mode imaging, spectral Doppler, color Doppler, and power Doppler as needed of all accessible portions of each vessel. Bilateral testing is considered an integral part of a complete examination. Limited examinations for reoccurring indications may be performed as noted. The reflux portion of the exam is performed with the patient in reverse Trendelenburg.  +-------+---------------+---------+-----------+----------+-------------------+ RIGHT  CompressibilityPhasicitySpontaneityPropertiesThrombus Aging      +-------+---------------+---------+-----------+----------+-------------------+ CFV    Full                                         pulsatile waveforms +-------+---------------+---------+-----------+----------+-------------------+ SFJ    Full                                                              +-------+---------------+---------+-----------+----------+-------------------+ FV ProxFull                                                             +-------+---------------+---------+-----------+----------+-------------------+ PFV    Full                                                             +-------+---------------+---------+-----------+----------+-------------------+   +---------+---------------+---------+-----------+----------+-------------------+ LEFT     CompressibilityPhasicitySpontaneityPropertiesThrombus Aging      +---------+---------------+---------+-----------+----------+-------------------+ CFV      Full                                         pulsatile waveforms +---------+---------------+---------+-----------+----------+-------------------+ SFJ  Full                                                             +---------+---------------+---------+-----------+----------+-------------------+ FV Prox  Full                                                             +---------+---------------+---------+-----------+----------+-------------------+ FV Mid   Full                                                             +---------+---------------+---------+-----------+----------+-------------------+ FV DistalFull                                         pulsatile waveforms +---------+---------------+---------+-----------+----------+-------------------+ PFV      Full                                                             +---------+---------------+---------+-----------+----------+-------------------+ POP      Full                                         pulsatile waveforms +---------+---------------+---------+-----------+----------+-------------------+ PTV      Full                                                             +---------+---------------+---------+-----------+----------+-------------------+ PERO      None                                         Acute               +---------+---------------+---------+-----------+----------+-------------------+ Gastroc  Full                                                             +---------+---------------+---------+-----------+----------+-------------------+     Summary: RIGHT: - No evidence of common femoral vein obstruction. pulsatile waveforms  LEFT: - Prior acute deep vein thrombosis involving the left peroneal veins, remains. - Findings appear improved from previous examination as the DVT in the gastroc vein appears to have resolved. Marland Kitchen  Pulsatile waveforms.  *See table(s) above for measurements and observations. Electronically signed by Gerarda Fraction on 11/20/2022 at 12:04:57 PM.    Final    Recent Labs    11/21/22 0627  WBC 9.7  HGB 13.6  HCT 40.6  PLT 283    Recent Labs    11/21/22 0627  NA 135  K 3.9  CL 104  CO2 22  GLUCOSE 118*  BUN 15  CREATININE 0.72  CALCIUM 8.8*     Intake/Output Summary (Last 24 hours) at 11/21/2022 0802 Last data filed at 11/20/2022 1852 Gross per 24 hour  Intake 440 ml  Output --  Net 440 ml        Physical Exam: Vital Signs Blood pressure 132/61, pulse 81, temperature 98.2 F (36.8 C), temperature source Oral, resp. rate 18, SpO2 95%.   General: No acute distress Mood and affect are appropriate Heart: Regular rate and rhythm no rubs murmurs or extra sounds Lungs: Clear to auscultation, breathing unlabored, no rales or wheezes Abdomen: Positive bowel sounds, soft nontender to palpation, nondistended Extremities: No clubbing, cyanosis, or edema Skin: No evidence of breakdown, no evidence of rash   Skin: No evidence of breakdown, no evidence of rash Neurologic: continues with delayed responses but oriented to person , place and situation , can respond to basic questions with extra time. motor strength is 5/5 in RIght and 0/5 left deltoid, bicep, tricep, grip, hip flexor, knee  extensors, ankle dorsiflexor and plantar flexor but very deliberate. Sensory exam normal sensation to LT in bilateral upper and lower extremities  Musculoskeletal: Full range of motion in all 4 extremities. No joint swelling   Assessment/Plan: 1. Functional deficits which require 3+ hours per day of interdisciplinary therapy in a comprehensive inpatient rehab setting. Physiatrist is providing close team supervision and 24 hour management of active medical problems listed below. Physiatrist and rehab team continue to assess barriers to discharge/monitor patient progress toward functional and medical goals  Care Tool:  Bathing    Body parts bathed by patient: Chest, Abdomen, Face   Body parts bathed by helper: Left arm, Right arm, Front perineal area, Buttocks, Left upper leg, Right upper leg, Right lower leg, Left lower leg     Bathing assist Assist Level: Maximal Assistance - Patient 24 - 49%     Upper Body Dressing/Undressing Upper body dressing   What is the patient wearing?: Pull over shirt    Upper body assist Assist Level: 2 Helpers    Lower Body Dressing/Undressing Lower body dressing      What is the patient wearing?: Incontinence brief, Pants     Lower body assist Assist for lower body dressing: 2 Helpers     Toileting Toileting    Toileting assist Assist for toileting: Dependent - Patient 0%     Transfers Chair/bed transfer  Transfers assist  Chair/bed transfer activity did not occur: Safety/medical concerns  Chair/bed transfer assist level: Dependent - mechanical lift     Locomotion Ambulation   Ambulation assist   Ambulation activity did not occur: Safety/medical concerns          Walk 10 feet activity   Assist  Walk 10 feet activity did not occur: Safety/medical concerns        Walk 50 feet activity   Assist Walk 50 feet with 2 turns activity did not occur: Safety/medical concerns         Walk 150 feet  activity   Assist Walk 150 feet activity did not occur:  Safety/medical concerns         Walk 10 feet on uneven surface  activity   Assist Walk 10 feet on uneven surfaces activity did not occur: Safety/medical concerns         Wheelchair     Assist Is the patient using a wheelchair?: Yes Type of Wheelchair: Manual Wheelchair activity did not occur: Safety/medical concerns         Wheelchair 50 feet with 2 turns activity    Assist    Wheelchair 50 feet with 2 turns activity did not occur: Safety/medical concerns       Wheelchair 150 feet activity     Assist  Wheelchair 150 feet activity did not occur: Safety/medical concerns       Blood pressure 132/61, pulse 81, temperature 98.2 F (36.8 C), temperature source Oral, resp. rate 18, SpO2 95%.  Medical Problem List and Plan: 1. 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  Antithrombotics: -DVT/anticoagulation:  Pharmaceutical:             left tib and peroneal DVTs, started on Eliquis, pt asymptomatic , discussed option of monitoring on lovenox 40mg   sq with serial dopplers   Repeat doppler showing resolution of calf DVT cont Lovenox              -antiplatelet therapy: Aspirin and Plavix for three weeks followed by Plavix alone.  Last dose of aspirin will be 7/29   3. Pain Management: Tylenol as needed   4. Mood/Behavior/Sleep: LCSW to evaluate and provide emotional support             -antipsychotic agents: n/a Add low dose ritalin for initiation issues    5. Neuropsych/cognition: This patient is not capable of making decisions on his own behalf.             -pt has hx of ETOH  abuse, 2-3 beers + wine most nights 6. Skin/Wound Care: Routine skin care checks   7. Fluids/Electrolytes/Nutrition: Routine Is and Os and follow-up chemistries Monday             -continue MVI, thiamine   8: Hypertension: monitor TID and prn (home meds: lisinopril, Lopressor)             -continue amlodipine 5 mg daily Vitals:   11/20/22 1943 11/21/22 0515  BP: (!) 149/59 132/61  Pulse: 85 81  Resp: 17 18  Temp: (!) 97.5 F (36.4 C) 98.2 F (36.8 C)  SpO2: 94% 95%                 9: Hyperlipidemia: continue statin   10: Prediabetes: ? diet controlled; A1c = 5.7%   11: Macrocytic anemia/B12 deficiency: follow-up CBC             -hx of pernicious anemia; PCP rec: daily B12 12. Ileus:             -moving bowels now. Last BM 7/12             -  senna-s at bedtime             -PPI, prn simethicone 13. 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 Thyroid nodule-TFT's essentially WNL             -f/u as outpt, thyroid U/S, etc.     LOS: 7 days A FACE TO FACE EVALUATION WAS PERFORMED  Erick Colace 11/21/2022, 8:02 AM

## 2022-11-21 NOTE — Progress Notes (Signed)
Physical Therapy Session Note  Patient Details  Name: Samuel Pearson MRN: [id number] Date of Birth: 03-03-1946  Today's Date: 11/21/2022 PT Individual Time: 1303-1420 PT Individual Time Calculation (min): 77 min   Short Term Goals: Week 1:  PT Short Term Goal 1 (Week 1): Pt will perform all aspects of bedmobility with consistent MaxA. PT Short Term Goal 2 (Week 1): Pt will perform sit<>stand with MaxA +1 PT Short Term Goal 3 (Week 1): Pt will perform seat to seat transfers with MaxA +1 PT Short Term Goal 4 (Week 1): Pt will initiate sitting balance and maintain with MaxA+1. PT Short Term Goal 5 (Week 1): Pt will initiate gait training.   Skilled Therapeutic Interventions/Progress Updates:  Patient seated upright in w/c on entrance to room. Slight back tilt to chair. Patient alert and agreeable to PT session.   Patient with no pain complaint at start of session.  Pt  shoes donned Max/ TotA. Wheeled to therapy gym dependently for session.  Therapeutic Activity: Transfers: Pt performed squat pivot transfer with TotA +2. Provided vc and max facilitation for forward lean and maintaining upright posture. Despite vc for flat palm to facilitate move toward hand, pt is unable to maintain focus and hand placement d/t easy distractibility.   Neuromuscular Re-ed: NMR facilitated during session with focus on seated standing balance, midline orientation, proprioception, maintaining balance. Pt guided in seated reaches to R side in order to improve R hemibody activation. With encouragement to reach red cone,He does demo increased effort to reach throughout session Requiring TotA up to minA for increasing trunkal activation to R side in order to grasp cone. Pt does demo continued TotA to maintain midline orientation and decrease LOB A/P.  Blocked practice for increased retraining of trunk activation. Complicated with increased pushing with RUE requiring continuous vc for repositioning or to decrease  push. Unable to self correct.   Sit<>stand in // bars in order to give pt steady hand hold with standing tolerance. Knees blocked with therapist's knees on Airex pad. Pt encourage with R hand hold to bar. Cued to lean forward requiring adjustment of RUE and pt attempt to assist as in with use of STEDY. Rise to stand completed with Max/ TotA and +2 with ModA ready to help into upright position with sheet wrapped across chest and under arms. Gait belt low under hips in order to assist with hip extension. Pt uses RLE to stand and intermittent repositioning to attempt to push into upright chest positioning. Pt does use to push to L. Is able to maintain stance for >52min.   NMR performed for improvements in motor control and coordination, balance, sequencing, judgement, and self confidence/ efficacy in performing all aspects of mobility at highest level of independence.   STEDY used for return to bed with pt able to pull at bar and use RLE to push into stance with Mod/ MaxA +2. Stands from [employer] seat with MinA +2 (MaxA for maintaining upright position). Lowers to seat at EOB with very slow descent and MinA. Positioned in bed with MaxA +2, initiates bridge to assist with removal of shorts. Shoes removed TotA  Patient supine in bed at end of session with brakes locked, bed alarm set, and all needs within reach.  Therapy Documentation Precautions:  Precautions Precautions: Fall Precaution Comments: dense L hemi with inability to initiate motor control with R hemibody Restrictions Weight Bearing Restrictions: No General:   Vital Signs:   Pain: Pain Assessment Pain Scale: 0-10 Pain Score: 0-No  pain this session.   Therapy/Group: Individual Therapy  [name] PT, DPT, CSRS 11/21/2022, 1:01 PM

## 2022-11-21 NOTE — Progress Notes (Signed)
Speech Language Pathology Daily Session Note  Patient Details  Name: Samuel Pearson MRN: [id number] Date of Birth: [birth year]  Today's Date: 11/21/2022 SLP Individual Time: 0800-0900 SLP Individual Time Calculation (min): 60 min  Short Term Goals: Week 1: SLP Short Term Goal 1 (Week 1): Patient will demonstrate orientation to time with mod multimodal cues SLP Short Term Goal 2 (Week 1): Patient will answer mildly complex problem solving questions with mod multimodal A SLP Short Term Goal 3 (Week 1): Patient will utilize memory compensatory strategies with mod multimodal A SLP Short Term Goal 4 (Week 1): Patient will demonstrate selective/sutained attention for >10 minutes with mod multimodal A SLP Short Term Goal 5 (Week 1): Patient will initiate verbal responses in 75% of opportunities with mod verbal cues SLP Short Term Goal 6 (Week 1): Patient will demonstrate intellectual awareness by verbalizing two physical and two cognitive impairments mod multimodal A  Skilled Therapeutic Interventions: Skilled treatment session focused on cognitive re-training. SLP facilitated session by utilizing mod verbal/visual cues to aid in problem solving calendar task. Patient instructed to point to where items would be placed on a calendar then answer functional comprehension questions. Patient able to answer questions with min A to scan to the left when necessary. Noted increased L inattention this date. Reviewed R-L scanning strategies; pt demonstrated understanding. Patient able to utilize up to date calendar to report date/day of the week with min cues at the end of the session. Patient left in bed with alarm set and call bell. Continue with POC.   Pain Pain Assessment Pain Scale: 0-10 Pain Score: 0-No pain  Therapy/Group: Individual Therapy  Mancel Lardizabal M.A., CF-SLP 11/21/2022, 10:06 AM

## 2022-11-21 NOTE — Progress Notes (Signed)
Occupational Therapy Session Note  Patient Details  Name: Samuel Pearson MRN: [id number] Date of Birth: July 11, 1945  Today's Date: 11/21/2022 OT Individual Time: 0945-1100 OT Individual Time Calculation (min): 75 min    Short Term Goals: Week 1:  OT Short Term Goal 1 (Week 1): Pt will be able to maintain static sitting EOB with mod A or less A to enable him to safely sit on a BSC. OT Short Term Goal 2 (Week 1): Pt will demonstrate increased postural awareness to recognize with mod cues when he is leaning/falling back or to his L in unsupported sitting. OT Short Term Goal 3 (Week 1): Pt will demonstrate improved L side awareness to wash left arm with min cues. OT Short Term Goal 4 (Week 1): Pt will be able to self feed with min A.  Skilled Therapeutic Interventions/Progress Updates:    Pt received in bed ready for therapy.   Pt seen this session to focus on postural control and L side awareness. Pt continues to have significant delays with motor movement but is responding somewhat faster with conversation. Pt able to tell me about a significant event in the news.   He continues to need max A with rolling and engagement in self care. Bathing and dressing completed from bed. Pt has a soiled brief so needed full A with cleansing bowel and bladder.  Rehab tech arrived to assist. Pt sat to EOB with total A.  Heavy max A to hold static sit with CONSTANT cues to have him use R hand on bar for support.  Used Stedy Plus mechanical lift with harness to transfer to w/c. It took a few adjustments to have his legs positioned correctly due to hemiplegia and apraxia.   His wife was present and very helpful with putting on arm rests and leg rests on wc.   Pt resting in  TIS w/c   with all needs met and a belt alarm on.   Therapy Documentation Precautions:  Precautions Precautions: Fall Precaution Comments: dense L hemi with inability to initiate motor control with R hemibody Restrictions Weight Bearing  Restrictions: No   Pain: Pain Assessment Pain Scale: 0-10 Pain Score: 0-No pain ADL: ADL Eating: Moderate assistance Grooming: Moderate assistance Upper Body Bathing: Moderate assistance Lower Body Bathing: Dependent Upper Body Dressing: Dependent Where Assessed-Upper Body Dressing: Bed level Lower Body Dressing: Dependent Where Assessed-Lower Body Dressing: Bed level Toileting: Unable to assess Toilet Transfer: Unable to assess   Therapy/Group: Individual Therapy  Annalisia Ingber 11/21/2022, 9:23 AM

## 2022-11-22 MED ORDER — FLUTICASONE PROPIONATE 50 MCG/ACT NA SUSP
1.0000 | Freq: Every day | NASAL | Status: DC
  Administered 2022-11-22 – 2022-12-09 (×17): 1 via NASAL
  Filled 2022-11-22: qty 16

## 2022-11-22 NOTE — Progress Notes (Signed)
Speech Language Pathology Weekly Progress and Session Note  Patient Details  Name: Samuel Pearson MRN: [id number] Date of Birth: Nov 23, 1945  Beginning of progress report period: November 15, 2022 End of progress report period: November 22, 2022  Today's Date: 11/22/2022 SLP Individual Time: 0800-0900 SLP Individual Time Calculation (min): 60 min  Short Term Goals: Week 1: SLP Short Term Goal 1 (Week 1): Patient will demonstrate orientation to time with mod multimodal cues SLP Short Term Goal 1 - Progress (Week 1): Met SLP Short Term Goal 2 (Week 1): Patient will answer mildly complex problem solving questions with mod multimodal A SLP Short Term Goal 2 - Progress (Week 1): Met SLP Short Term Goal 3 (Week 1): Patient will utilize memory compensatory strategies with mod multimodal A SLP Short Term Goal 3 - Progress (Week 1): Met SLP Short Term Goal 4 (Week 1): Patient will demonstrate selective/sutained attention for >10 minutes with mod multimodal A SLP Short Term Goal 4 - Progress (Week 1): Met SLP Short Term Goal 5 (Week 1): Patient will initiate verbal responses in 75% of opportunities with mod verbal cues SLP Short Term Goal 5 - Progress (Week 1): Met SLP Short Term Goal 6 (Week 1): Patient will demonstrate intellectual awareness by verbalizing two physical and two cognitive impairments mod multimodal A SLP Short Term Goal 6 - Progress (Week 1): Progressing toward goal    New Short Term Goals: Week 2: SLP Short Term Goal 1 (Week 2): Patient will demonstrate orientation to time with min multimodal cues SLP Short Term Goal 2 (Week 2): Patient will answer mildly complex problem solving questions with min multimodal A SLP Short Term Goal 3 (Week 2): Patient will utilize memory compensatory strategies with min multimodal A SLP Short Term Goal 4 (Week 2): Patient will demonstrate selective/sutained attention for >15 minutes with min multimodal A SLP Short Term Goal 5 (Week 2): Patient will  initiate verbal responses in 75% of opportunities with min verbal cues SLP Short Term Goal 6 (Week 2): Patient will demonstrate intellectual awareness by verbalizing two physical and two cognitive impairments mod multimodal A  Weekly Progress Updates: Pt has made great gains and has met 5/6 STG's this reporting period due to improved verbal initiation, cognition, and orientation. Patient continues to require mod A during problem solving and attention activities as he often becomes distracted by environmental surroundings. Patient able to orient to time with mod A utilizing calendar and functional cues. Verbal initiation improved this reporting period as patient demonstrates verbal responses 80% of the time with ~15 second delay. Patient did not meet intellectual awareness goal this week as it was not formally assessed. Pt/family education ongoing. Pt would benefit from continued skilled ST intervention to maximize initiation, attention, awareness, and cognition in order to maximize functional independence at d/c.   Intensity: Minumum of 1-2 x/day, 30 to 90 minutes Frequency: 1 to 3 out of 7 days Duration/Length of Stay: 12/13/22 Treatment/Interventions: Cognitive remediation/compensation;Internal/external aids;Speech/Language facilitation;Cueing hierarchy;Environmental controls;Therapeutic Activities;Functional tasks;Patient/family education;Therapeutic Exercise   Daily Session  Skilled Therapeutic Interventions:  Skilled therapy session focused on cognitive re-training. Upon entrance, patient consuming breakfast with no s/sx of aspiration through noted slow physical and cognitive processing speed. SLP facilitated session by providing mod I assist during problem solving activities related to the home. Patient answered all functional questions with 100% accuracy with ~10-15 second delay. Patient was also able to utilize calendar to correctly state day of the week and full date. Patient reports wanting  to address  processing speed in future activities. Patient left in bed with alarm set and call bell in reach. Continue POC.    Pain Pain Assessment Pain Scale: 0-10 Pain Score: 0-No pain  Therapy/Group: Individual Therapy  Densel Kronick M.A., CF-SLP 11/22/2022, 10:50 AM

## 2022-11-22 NOTE — Progress Notes (Signed)
PROGRESS NOTE   Subjective/Complaints:  C/o "sinus pain"  pt denies breathing issues or nasal drainage , had this problem at home   ROS: Limited due to cognitive/behavioral   Objective:   VAS [place] LOWER EXTREMITY VENOUS (DVT)  Result Date: 11/20/2022  Lower Venous DVT Study Patient Name:  Samuel Pearson  Date of Exam:   11/20/2022 Medical Rec #: [id number]         Accession #:    [id number] Date of Birth: 24-Aug-1945         Patient Gender: M Patient Age:   77 years Exam Location:  [facility] Procedure:      VAS [place] LOWER EXTREMITY VENOUS (DVT) Referring Phys: Wendi Maya --------------------------------------------------------------------------------  Indications: F/U DVT found 11/15/2022 in the left gastroc and peroneal veins.  Risk Factors: Immobility secondary to stroke, patient in rehab Comparison Study: Prior bilateral LEV done 11/15/2022 Performing Technologist: Sherren Kerns RVS  Examination Guidelines: A complete evaluation includes B-mode imaging, spectral Doppler, color Doppler, and power Doppler as needed of all accessible portions of each vessel. Bilateral testing is considered an integral part of a complete examination. Limited examinations for reoccurring indications may be performed as noted. The reflux portion of the exam is performed with the patient in reverse Trendelenburg.  +-------+---------------+---------+-----------+----------+-------------------+ RIGHT  CompressibilityPhasicitySpontaneityPropertiesThrombus Aging      +-------+---------------+---------+-----------+----------+-------------------+ CFV    Full                                         pulsatile waveforms +-------+---------------+---------+-----------+----------+-------------------+ SFJ    Full                                                             +-------+---------------+---------+-----------+----------+-------------------+ FV  ProxFull                                                             +-------+---------------+---------+-----------+----------+-------------------+ PFV    Full                                                             +-------+---------------+---------+-----------+----------+-------------------+   +---------+---------------+---------+-----------+----------+-------------------+ LEFT     CompressibilityPhasicitySpontaneityPropertiesThrombus Aging      +---------+---------------+---------+-----------+----------+-------------------+ CFV      Full                                         pulsatile waveforms +---------+---------------+---------+-----------+----------+-------------------+ SFJ  Full                                                             +---------+---------------+---------+-----------+----------+-------------------+ FV Prox  Full                                                             +---------+---------------+---------+-----------+----------+-------------------+ FV Mid   Full                                                             +---------+---------------+---------+-----------+----------+-------------------+ FV DistalFull                                         pulsatile waveforms +---------+---------------+---------+-----------+----------+-------------------+ PFV      Full                                                             +---------+---------------+---------+-----------+----------+-------------------+ POP      Full                                         pulsatile waveforms +---------+---------------+---------+-----------+----------+-------------------+ PTV      Full                                                             +---------+---------------+---------+-----------+----------+-------------------+ PERO     None                                         Acute                +---------+---------------+---------+-----------+----------+-------------------+ Gastroc  Full                                                             +---------+---------------+---------+-----------+----------+-------------------+     Summary: RIGHT: - No evidence of common femoral vein obstruction. pulsatile waveforms  LEFT: - Prior acute deep vein thrombosis involving the left peroneal veins, remains. - Findings appear improved from previous examination as the DVT in the gastroc vein appears to have resolved. Marland Kitchen  Pulsatile waveforms.  *See table(s) above for measurements and observations. Electronically signed by Gerarda Fraction on 11/20/2022 at 12:04:57 PM.    Final    Recent Labs    11/21/22 0627  WBC 9.7  HGB 13.6  HCT 40.6  PLT 283    Recent Labs    11/21/22 0627  NA 135  K 3.9  CL 104  CO2 22  GLUCOSE 118*  BUN 15  CREATININE 0.72  CALCIUM 8.8*     Intake/Output Summary (Last 24 hours) at 11/22/2022 0651 Last data filed at 11/21/2022 1848 Gross per 24 hour  Intake 720 ml  Output --  Net 720 ml        Physical Exam: Vital Signs Blood pressure (!) 127/49, pulse 74, temperature 98.3 F (36.8 C), temperature source Oral, resp. rate 16, SpO2 97%.  HEENT- neg rhinorrhea, neg pain with sinus pressure over frontal and maxillary region  General: No acute distress Mood and affect are appropriate Heart: Regular rate and rhythm no rubs murmurs or extra sounds Lungs: Clear to auscultation, breathing unlabored, no rales or wheezes Abdomen: Positive bowel sounds, soft nontender to palpation, nondistended Extremities: No clubbing, cyanosis, or edema Skin: No evidence of breakdown, no evidence of rash   Skin: No evidence of breakdown, no evidence of rash Neurologic: continues with delayed responses but oriented to person , place and situation , can respond to basic questions with extra time. motor strength is 5/5 in RIght and 0/5 left deltoid, bicep, tricep, grip, hip  flexor, knee extensors, ankle dorsiflexor and plantar flexor but very deliberate. Sensory exam normal sensation to LT in bilateral upper and lower extremities  Musculoskeletal: Full range of motion in all 4 extremities. No joint swelling   Assessment/Plan: 1. Functional deficits which require 3+ hours per day of interdisciplinary therapy in a comprehensive inpatient rehab setting. Physiatrist is providing close team supervision and 24 hour management of active medical problems listed below. Physiatrist and rehab team continue to assess barriers to discharge/monitor patient progress toward functional and medical goals  Care Tool:  Bathing    Body parts bathed by patient: Chest, Abdomen, Face   Body parts bathed by helper: Left arm, Right arm, Front perineal area, Buttocks, Left upper leg, Right upper leg, Right lower leg, Left lower leg     Bathing assist Assist Level: Maximal Assistance - Patient 24 - 49%     Upper Body Dressing/Undressing Upper body dressing   What is the patient wearing?: Pull over shirt    Upper body assist Assist Level: 2 Helpers    Lower Body Dressing/Undressing Lower body dressing      What is the patient wearing?: Incontinence brief, Pants     Lower body assist Assist for lower body dressing: 2 Helpers     Toileting Toileting    Toileting assist Assist for toileting: Dependent - Patient 0%     Transfers Chair/bed transfer  Transfers assist  Chair/bed transfer activity did not occur: Safety/medical concerns  Chair/bed transfer assist level: Dependent - mechanical lift     Locomotion Ambulation   Ambulation assist   Ambulation activity did not occur: Safety/medical concerns          Walk 10 feet activity   Assist  Walk 10 feet activity did not occur: Safety/medical concerns        Walk 50 feet activity   Assist Walk 50 feet with 2 turns activity did not occur: Safety/medical concerns         Walk  150 feet  activity   Assist Walk 150 feet activity did not occur: Safety/medical concerns         Walk 10 feet on uneven surface  activity   Assist Walk 10 feet on uneven surfaces activity did not occur: Safety/medical concerns         Wheelchair     Assist Is the patient using a wheelchair?: Yes Type of Wheelchair: Manual Wheelchair activity did not occur: Safety/medical concerns         Wheelchair 50 feet with 2 turns activity    Assist    Wheelchair 50 feet with 2 turns activity did not occur: Safety/medical concerns       Wheelchair 150 feet activity     Assist  Wheelchair 150 feet activity did not occur: Safety/medical concerns       Blood pressure (!) 127/49, pulse 74, temperature 98.3 F (36.8 C), temperature source Oral, resp. rate 16, SpO2 97%.  Medical Problem List and Plan: 1. 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  Antithrombotics: -DVT/anticoagulation:  Pharmaceutical:             left tib and peroneal DVTs, started on Eliquis, pt asymptomatic , discussed option of monitoring on lovenox 40mg   sq with serial dopplers   Repeat doppler showing resolution of calf DVT cont Lovenox              -antiplatelet therapy: Aspirin and Plavix for three weeks followed by Plavix alone.  Last dose of aspirin will be 7/29   3. Pain Management: Tylenol as needed   4. Mood/Behavior/Sleep: LCSW to evaluate and provide emotional support             -antipsychotic agents: n/a Add low dose ritalin for initiation issues    5. Neuropsych/cognition: This patient is not capable of making decisions on his own behalf.              -pt has hx of ETOH abuse, 2-3 beers + wine most nights 6. Skin/Wound Care: Routine skin care checks   7. Fluids/Electrolytes/Nutrition: Routine Is and Os and follow-up chemistries Monday             -continue MVI, thiamine   8: Hypertension: monitor TID and prn (home meds: lisinopril, Lopressor)             -continue amlodipine 5 mg daily Vitals:   11/21/22 1924 11/22/22 0447  BP: (!) 146/55 (!) 127/49  Pulse: 91 74  Resp: 16 16  Temp: 98.2 F (36.8 C) 98.3 F (36.8 C)  SpO2: 97% 97%   Controlled 7/16              9: Hyperlipidemia: continue statin   10: Prediabetes: ? diet controlled; A1c = 5.7%   11: Macrocytic anemia/B12 deficiency: follow-up CBC- Hgb normal /improved 7/15             -hx of pernicious anemia; PCP rec: daily B12    Latest Ref  Rng & Units 11/21/2022    6:27 AM 11/15/2022    5:46 AM 11/13/2022    5:25 AM  CBC  WBC 4.0 - 10.5 K/uL 9.7  7.7  10.9   Hemoglobin 13.0 - 17.0 g/dL 91.4  78.2  95.6   Hematocrit 39.0 - 52.0 % 40.6  35.9  33.6   Platelets 150 - 400 K/uL 283  214  176     12. Ileus:Resolved 7/16             -moving bowels now. Last BM 7/15             -senna-s at bedtime             -PPI, prn simethicone 13. 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 Thyroid nodule-TFT's essentially WNL             -f/u as outpt, thyroid U/S, etc.     LOS: 8 days A FACE TO FACE EVALUATION WAS PERFORMED  Erick Colace 11/22/2022, 6:51 AM

## 2022-11-22 NOTE — Progress Notes (Signed)
Physical Therapy Weekly Progress Note  Patient Details  Name: Samuel Pearson MRN: [id number] Date of Birth: 22-Jan-1946  Beginning of progress report period: November 15, 2022 End of progress report period: November 22, 2022  {CHL IP REHAB PT TIME CALCULATION:304800500}  Patient has met {number 1-5:22450} of {number 1-5:20334} short term goals.  Pt making very slow progress towards goals and may need add'l time to meet LTG due to barriers from high pain levels and nausea preventing complete participation in scheduled therapy sessions. He continues to require physical assist to complete bed mobility d/t pain, functional transfers with CGA/ close supervision dependent on fatigue/ weakness in BLE from pain, and completes short distance ambulation using RW and CGA. Has not yet progressed to stair training. He continues to self limit daily d/t pain and nausea 2/2 pain (despite continued education), and is limited by low activity tolerance as wellas generalized and BLE weakness. *** has participated in family education to prepare for d/c home.    Patient continues to demonstrate the following deficits {impairments:3041632} and therefore will continue to benefit from skilled PT intervention to increase functional independence with mobility.  Patient {LTG progression:3041653}.  {plan of WUJW:1191478}  PT Short Term Goals {GNF:6213086}  Skilled Therapeutic Interventions/Progress Updates:  Ambulation/gait training;Discharge planning;Functional mobility training;Psychosocial support;Therapeutic Activities;Visual/perceptual remediation/compensation;Balance/vestibular training;Disease management/prevention;Neuromuscular re-education;Skin care/wound management;Wheelchair propulsion/positioning;UE/LE Strength taining/ROM;Splinting/orthotics;DME/adaptive equipment instruction;Cognitive remediation/compensation;Community reintegration;Patient/family education;Stair training;UE/LE Coordination activities;Functional  electrical stimulation;Therapeutic Exercise   Therapy Documentation Precautions:  Precautions Precautions: Fall Precaution Comments: dense L hemi with inability to initiate motor control with R hemibody Restrictions Weight Bearing Restrictions: No General:    Pain: Pain Assessment Pain Scale: 0-10 Pain Score: 0-No pain Vision/Perception  Vision - History Ability to See in Adequate Light: 0 Adequate Perception Perception: Impaired Inattention/Neglect: Does not attend to left visual field;Does not attend to left side of body Praxis Praxis: Impaired Praxis Impairment Details: Initiation;Motor planning  Mobility:   Locomotion :    Trunk/Postural Assessment :    Balance: Standardized Balance Assessment Standardized Balance Assessment: PASS Postural Assessment Scale for Stroke Patients=PASS 1. Sitting Without Support: Cannot sit 2. Standing With Support: Cannot stand, even with support 3. Standing Without Support: Cannot stand without support 4.Standing on Nonparetic Leg: Cannot stand on nonparetic leg 5.Standing on Paretic Leg: Cannot stand on paretic leg MAINTAINING POSTURE SUBTOTAL: 0 6. Supine to Paretic Side Lateral: Can perform with much help 7. Supine to Nonparetic Side Lateral: Cannot perform 8. Supine to Sitting Up on the Edge of the Mat: Cannot perform 9. Sitting on the Edge of the Mat to Supine: Cannot perform 10. Sitting to Standing Up: Cannot perform 11. Standing Up to Sitting Down: Cannot perform 12. Standing,Picking Up a Pencil from the Floor: Cannot perform CHANGING POSTURE SUBTOTAL: 1 PASS TOTAL SCORE: 1 Static Sitting Balance Static Sitting - Level of Assistance: 1: +2 Total assist Dynamic Sitting Balance Sitting balance - Comments: Posterior, anterior, and L lateral lean noted with no reactional balance strategies during LOB, needing Max/TotA consistently for static sitting balance to midline   Therapy/Group: {Therapy/Group:[id number]}  [name] 11/22/2022, 6:47 AM

## 2022-11-22 NOTE — Progress Notes (Signed)
Occupational Therapy Session Note  Patient Details  Name: Samuel Pearson MRN: [id number] Date of Birth: Jan 06, 1946  Today's Date: 11/22/2022 OT Individual Time: 1341-1431 OT Individual Time Calculation (min): 50 min  and Today's Date: 11/22/2022 OT Missed Time: 10 Minutes Missed Time Reason: Other (comment) (therapist running late from required meeting)   Short Term Goals: Week 1:  OT Short Term Goal 1 (Week 1): Pt will be able to maintain static sitting EOB with mod A or less A to enable him to safely sit on a BSC. OT Short Term Goal 2 (Week 1): Pt will demonstrate increased postural awareness to recognize with mod cues when he is leaning/falling back or to his L in unsupported sitting. OT Short Term Goal 3 (Week 1): Pt will demonstrate improved L side awareness to wash left arm with min cues. OT Short Term Goal 4 (Week 1): Pt will be able to self feed with min A.  Skilled Therapeutic Interventions/Progress Updates:  Pt greeted seated in TIS, pt agreeable to OT intervention. Total A transport to gym for time mgmt.   1:1 NMES applied to L wrist extensors  at the below settings:    Ratio 1:3 Rate 35 pps Waveform- Asymmetric Ramp 1.0 Pulse 300 Intensity- 24  Duration -   15 mins No pain or adverse reactions noted during/after session.   Focused on functional grasp and release of compliant cube with pt instructed to release cube when device activated and to grasp cube when device deactivated. Pt presenting with difficulty sustaining attention to task in busy gym needing multiple cues to "focus."   Education provided on self ROM via elbow flexion/extension with pt educated on technique and instructed to return demonstrate, pt able to do so with MIN cues for technique. Pt able to verbalize instructions of completing self ROM daily.   Provided PROM to L elbow with noted trace activation at L bicep when completing elbow flexion.   Transported back to room with total A with pt left up  in w/c with safety belt alarm activated and all needs within reach.   Therapy Documentation Precautions:  Precautions Precautions: Fall Precaution Comments: dense L hemi with inability to initiate motor control with R hemibody, easily distracted Restrictions Weight Bearing Restrictions: No General: General OT Amount of Missed Time: 10 Minutes  Pain: No pain indicated   Therapy/Group: Individual Therapy  Pollyann Glen [facility] 11/22/2022, 2:42 PM

## 2022-11-23 NOTE — Progress Notes (Signed)
Physical Therapy Session Note  Patient Details  Name: Samuel Pearson MRN: [id number] Date of Birth: 1945-08-03  Today's Date: 11/23/2022 PT Individual Time: 0915-1002 PT Individual Time Calculation (min): 47 min   Short Term Goals: Week 2:  PT Short Term Goal 1 (Week 2): Pt will perform all aspects of bed mobility with consistent MaxA. PT Short Term Goal 2 (Week 2): Pt will perform sit<>stand with MaxA +1 PT Short Term Goal 3 (Week 2): Pt will perform seat to seat transfers with MaxA +1 PT Short Term Goal 4 (Week 2): Pt will initiate sitting balance and maintain with MaxA+1. PT Short Term Goal 5 (Week 2): Pt will initiate gait training.  Skilled Therapeutic Interventions/Progress Updates:      Pt in bed to start - agreeable to therapy session. Delayed processing, initiation, and response time to verbal cueing. Pt reporting need to have BM - "in the powers that be, can I sit on the toilet?"  Supine<>sitting EOB with totalA of 1 person, using hospital bed features. Allowed time for initiation and processing during task. Significant posterior leaning and pushing while sitting EOB, difficult to correct.  Sit<>stand to [facility] with +2 maxA with assist for positioning and facilitating forward weight shifting, Dependent assist for placing his paretic L leg on stedy. Difficulty getting flaps under hips due to insufficient hip and trunk extension. Would benefit from having a bariatric stedy with swivel paddles.  +2 assist needed for safely using the Stedy to transfer him onto [facility]. Patient with heavy leaning L while sitting in perched position. Placed BSC along wall, using the wall on his R side as a tactile and visual cue to correct sitting balance to make toileting therapeutic. +2 assist for safely sitting on BSC and managing already soiled brief. Worked on sitting balance while on BSC but patient unable to have further BM as he was already incontinent in brief.  Patient needing +3 assist to safely  stand in the stedy and have NT complete posterior pericare in standing. MaxA for facilitating hip extension and trunk extension to allow adequate clearance. Similar assist for donning new brief and clean pants. Tried using the foot of bed rail to facilitate standing to pull pants over hips - improved initiation with this setup but continued to require +2 totalA for standing.   Session concluded with patient reclined in TIS w/c, pillows supporting his L side and arm, safety belt engaged. All needs met.   Therapy Documentation Precautions:  Precautions Precautions: Fall Precaution Comments: dense L hemi with inability to initiate motor control with R hemibody, easily distracted Restrictions Weight Bearing Restrictions: No   Therapy/Group: Individual Therapy  [name]  PT, DPT, CSRS  11/23/2022, 10:12 AM

## 2022-11-23 NOTE — Patient Care Conference (Cosign Needed)
Inpatient RehabilitationTeam Conference and Plan of Care Update Date: 11/23/2022   Time: 10:12 AM    Patient Name: Samuel Pearson      Medical Record Number: [id number]  Date of Birth: 01/14/1946 Sex: Male         Room/Bed: 4M03C/4M03C-01 Payor Info: Payor: Multimedia programmer / Plan: [facility] MEDICARE / Product Type: *No Product type* /    Admit Date/Time:  11/14/2022  4:37 PM  Primary Diagnosis:  CVA (cerebral vascular accident) [facility])  Hospital Problems: Principal Problem:   CVA (cerebral vascular accident) [facility])    Expected Discharge Date: Expected Discharge Date: 12/13/22  Team Members Present: Physician leading conference: Dr. Claudette Laws Social Worker Present: Lavera Guise, BSW Nurse Present: Chana Bode, RN PT Present: Wynelle Link, PT;Dominic Sandoval, PTA OT Present: Bonnell Public, OT SLP Present: Everardo Pacific, SLP PPS Coordinator present : Fae Pippin, SLP     Current Status/Progress Goal Weekly Team Focus  Bowel/Bladder   incontinent to B/B LBM 7/14   less incontinent episodes   toilet every 4 hours    Swallow/Nutrition/ Hydration               ADL's   limited functional changes due to continued impairments with postural control, awareness, L neglect, L hemiplegia, but pt has been able to engage more in UB bathing and dressing at a max A level.  Slight improvements in response time.   mod A   improving postural control to enable pt to sit safely on BSC to develop continence, improving awareness of L side to improve safety with mobility    Mobility   Bed mobility = Max/TotA, transfers = MaxA, sitting balance = TotA with no initiation to correct in any direction, but will engage trunk with reaching activity to anterior, right side with MaxA to maintain position; no ambulation yet without trunk control   ModA overall - will require downgrading to most prior to d/c  Barriers:L hemiplegia with slow processing/ planning /// Work on:  awareness, initiation, motor planning, motor control of unaffected side, midline orientation    Communication               Safety/Cognition/ Behavioral Observations  increased verbal processing speed this week ~15 seconds. mod A w/ problem solving and memory tasks. able to utilize calendar and funcitonal cues to orient to time   mod I, supervision   attention to L side, increasing processing speed, mildly complex problelm solving    Pain   pt denies pain   pt to remain free of pain   assess every shift and PRn    Skin   skin intact   less incontinent episodes, skin to remain intact  maintain skin intergrity, keep skin dry      Discharge Planning:  Discharging home with spouse who plans to assist and supervise 24/7. Barrier: Level of care and carefiver support.   Team Discussion: Patient delayed responses, incontinence, slow processing and left neglect with hemiplegia, decreased postural control, and emotional lability post CVA. Note left calf DVT resolution per dopplers.   Patient on target to meet rehab goals: Currently working on complex problem solving, left inattention and verbal processing time has improved and need for functional cues has improved. Goals for discharge set for mod assist overall.  *See Care Plan and progress notes for long and short-term goals.   Revisions to Treatment Plan:  N/a   Teaching Needs: Safety, medications, transfers, toileting, skin care, loop recorder management, etc.  Current Barriers to Discharge: Home enviroment access/layout, Incontinence, and Lack of/limited family support  Possible Resolutions to Barriers: Family education     Medical Summary Current Status: left calf DVT resolved, incont of bowel adn bladder, very delayed responses but more alert  Barriers to Discharge: Uncontrolled Hypertension;Incontinence   Possible Resolutions to [employer] Focus: cont with timed toileting, BP management , neuromuscular stim and  re ed   Continued Need for Acute Rehabilitation Level of Care: The patient requires daily medical management by a physician with specialized training in physical medicine and rehabilitation for the following reasons: Direction of a multidisciplinary physical rehabilitation program to maximize functional independence : Yes Medical management of patient stability for increased activity during participation in an intensive rehabilitation regime.: Yes Analysis of laboratory values and/or radiology reports with any subsequent need for medication adjustment and/or medical intervention. : Yes   I attest that I was present, lead the team conference, and concur with the assessment and plan of the team.   Chana Bode B 11/23/2022, 2:43 PM

## 2022-11-23 NOTE — Progress Notes (Signed)
Speech Language Pathology Daily Session Note  Patient Details  Name: Samuel Pearson MRN: [id number] Date of Birth: [birth year]  Today's Date: 11/23/2022 SLP Individual Time: 0800-0900 SLP Individual Time Calculation (min): 60 min  Short Term Goals: Week 2: SLP Short Term Goal 1 (Week 2): Patient will demonstrate orientation to time with min multimodal cues SLP Short Term Goal 2 (Week 2): Patient will answer mildly complex problem solving questions with min multimodal A SLP Short Term Goal 3 (Week 2): Patient will utilize memory compensatory strategies with min multimodal A SLP Short Term Goal 4 (Week 2): Patient will demonstrate selective/sutained attention for >15 minutes with min multimodal A SLP Short Term Goal 5 (Week 2): Patient will initiate verbal responses in 75% of opportunities with min verbal cues SLP Short Term Goal 6 (Week 2): Patient will demonstrate intellectual awareness by verbalizing two physical and two cognitive impairments mod multimodal A  Skilled Therapeutic Interventions: Skilled treatment session focused on cognitive re-training, specifically addressing processing time. SLP facilitated session by providing min verbal A to keep patient engaged in activity and repeat instructions for completing tasks during card sorting/card game. Patient able to physically and verbally sort cards by suit. Patient with >80% initiation of verbal responses this date, however still demonstrated time delay of ~10 seconds. During card game, patient perseverated on "queen," requiring cues from SLP to utilize other suits. Intellectual awareness was demonstrated this date as patient reported wanting to continue to address response time in therapy. Patient left in bed with alarm set and call bell in reach. Continue POC.   Pain Pain Assessment Pain Scale: 0-10 Pain Score: 0-No pain  Therapy/Group: Individual Therapy  Kiyara Bouffard M.A., CF-SLP 11/23/2022, 10:22 AM

## 2022-11-23 NOTE — Progress Notes (Addendum)
PROGRESS NOTE   Subjective/Complaints:  No sinus pain, no leg pain, was incont of bowel per CNA, "small smear"    ROS: Limited due to cognitive/behavioral   Objective:   No results found. Recent Labs    11/21/22 0627  WBC 9.7  HGB 13.6  HCT 40.6  PLT 283    Recent Labs    11/21/22 0627  NA 135  K 3.9  CL 104  CO2 22  GLUCOSE 118*  BUN 15  CREATININE 0.72  CALCIUM 8.8*     Intake/Output Summary (Last 24 hours) at 11/23/2022 0732 Last data filed at 11/22/2022 1839 Gross per 24 hour  Intake 360 ml  Output --  Net 360 ml        Physical Exam: Vital Signs Blood pressure (!) 146/65, pulse 81, temperature 98.4 F (36.9 C), temperature source Oral, resp. rate 16, SpO2 96%.  HEENT- neg rhinorrhea, neg pain with sinus pressure over frontal and maxillary region  General: No acute distress Mood and affect are appropriate Heart: Regular rate and rhythm no rubs murmurs or extra sounds Lungs: Clear to auscultation, breathing unlabored, no rales or wheezes Abdomen: Positive bowel sounds, soft nontender to palpation, nondistended Extremities: No clubbing, cyanosis, or edema Skin: No evidence of breakdown, no evidence of rash   Skin: No evidence of breakdown, no evidence of rash Neurologic: continues with delayed responses but oriented to person , place and situation , can respond to basic questions with extra time. motor strength is 5/5 in RIght and 0/5 left deltoid, bicep, tricep, grip, hip flexor, knee extensors, ankle dorsiflexor and plantar flexor but very deliberate. Sensory exam normal sensation to LT in bilateral upper and lower extremities  Musculoskeletal: Full range of motion in all 4 extremities. No joint swelling   Assessment/Plan: 1. Functional deficits which require 3+ hours per day of interdisciplinary therapy in a comprehensive inpatient rehab setting. Physiatrist is providing close team  supervision and 24 hour management of active medical problems listed below. Physiatrist and rehab team continue to assess barriers to discharge/monitor patient progress toward functional and medical goals  Care Tool:  Bathing    Body parts bathed by patient: Chest, Abdomen, Face   Body parts bathed by helper: Left arm, Right arm, Front perineal area, Buttocks, Left upper leg, Right upper leg, Right lower leg, Left lower leg     Bathing assist Assist Level: Maximal Assistance - Patient 24 - 49%     Upper Body Dressing/Undressing Upper body dressing   What is the patient wearing?: Pull over shirt    Upper body assist Assist Level: 2 Helpers    Lower Body Dressing/Undressing Lower body dressing      What is the patient wearing?: Incontinence brief, Pants     Lower body assist Assist for lower body dressing: 2 Helpers     Toileting Toileting    Toileting assist Assist for toileting: Dependent - Patient 0%     Transfers Chair/bed transfer  Transfers assist  Chair/bed transfer activity did not occur: Safety/medical concerns  Chair/bed transfer assist level: Dependent - mechanical lift     Locomotion Ambulation   Ambulation assist   Ambulation activity did not  occur: Safety/medical concerns          Walk 10 feet activity   Assist  Walk 10 feet activity did not occur: Safety/medical concerns        Walk 50 feet activity   Assist Walk 50 feet with 2 turns activity did not occur: Safety/medical concerns         Walk 150 feet activity   Assist Walk 150 feet activity did not occur: Safety/medical concerns         Walk 10 feet on uneven surface  activity   Assist Walk 10 feet on uneven surfaces activity did not occur: Safety/medical concerns         Wheelchair     Assist Is the patient using a wheelchair?: Yes Type of Wheelchair: Manual Wheelchair activity did not occur: Safety/medical concerns         Wheelchair 50 feet  with 2 turns activity    Assist    Wheelchair 50 feet with 2 turns activity did not occur: Safety/medical concerns       Wheelchair 150 feet activity     Assist  Wheelchair 150 feet activity did not occur: Safety/medical concerns       Blood pressure (!) 146/65, pulse 81, temperature 98.4 F (36.9 C), temperature source Oral, resp. rate 16, SpO2 96%.  Medical Problem List and Plan: 1. 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 Formulized individual treatment plan based on medical history, underlying problem and comorbidities.              -ordered PRAFO and WHO for left side   -Continue CIR therapies including PT, OT, and SLP   2.  Antithrombotics: -DVT/anticoagulation:  Pharmaceutical:             left tib and peroneal DVTs, started on Eliquis, pt asymptomatic ,Repeat doppler showing resolution of calf DVT cont Lovenox - no f/u doppler needed             -antiplatelet therapy: Aspirin and Plavix for three weeks followed by Plavix alone.  Last dose of aspirin will be 7/29   3. Pain Management: Tylenol as needed   4. Mood/Behavior/Sleep: LCSW to evaluate and provide emotional support             -antipsychotic agents: n/a Add low dose ritalin for initiation issues    5. Neuropsych/cognition: This patient is not capable of making decisions on his own behalf.             -pt has hx of ETOH abuse, 2-3 beers + wine most nights 6. Skin/Wound Care: Routine skin care checks   7. Fluids/Electrolytes/Nutrition: Routine Is and Os and follow-up chemistries Monday             -continue MVI, thiamine   8: Hypertension: monitor TID  and prn (home meds: lisinopril, Lopressor)             -continue amlodipine 5 mg daily Vitals:   11/22/22 1902 11/23/22 0417  BP: (!) 124/90 (!) 146/65  Pulse: 93 81  Resp: 17 16  Temp: 98.6 F (37 C) 98.4 F (36.9 C)  SpO2: 95% 96%   Controlled 7/16              9: Hyperlipidemia: continue statin   10: Prediabetes: ? diet controlled; A1c = 5.7%   11: Macrocytic anemia/B12 deficiency: follow-up CBC- Hgb normal /improved 7/15             -hx of pernicious anemia; PCP rec: daily B12    Latest Ref Rng & Units 11/21/2022    6:27 AM 11/15/2022    5:46 AM 11/13/2022    5:25 AM  CBC  WBC 4.0 - 10.5 K/uL 9.7  7.7  10.9   Hemoglobin 13.0 - 17.0 g/dL 47.8  29.5  62.1   Hematocrit 39.0 - 52.0 % 40.6  35.9  33.6   Platelets 150 - 400 K/uL 283  214  176     12. Ileus:Resolved 7/16             -moving bowels now. Last BM 7/15             -senna-s at bedtime             -PPI, prn simethicone 13. 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 Thyroid nodule-TFT's essentially WNL             -f/u as outpt, thyroid U/S, etc.     LOS: 9 days A FACE TO FACE EVALUATION WAS PERFORMED  Erick Colace 11/23/2022, 7:32 AM

## 2022-11-23 NOTE — Progress Notes (Signed)
Patient ID: BRONCO MCGRORY, male   DOB: [birth year], 77 y.o.   MRN: [id number]  Team Conference Report to Patient/Family  Team Conference discussion was reviewed with the patient and caregiver, including goals, any changes in plan of care and target discharge date.  Patient and caregiver express understanding and are in agreement.  The patient has a target discharge date of 12/13/22.   SW met with patient and spouse at beside and provided team conference updates. Spouse present today and has expressed that currently it will be just her providing assistance for patient at home and she is unable to provide a lot of assist. Daughter works and will be unable to assist daily. Spouse will have daughter present next week to discuss conference and discuss LOC needed at home. Spouse considering HC based on patient's progress and d/c plan. Based on the conversation with spouse and daughter next week SW will discuss resources of [facility] or SNF. No additional questions or concerns.   Andria Rhein 11/23/2022, 12:39 PM

## 2022-11-23 NOTE — Progress Notes (Signed)
Occupational Therapy Weekly Progress Note  Patient Details  Name: Samuel Pearson MRN: [id number] Date of Birth: 1945-06-17  Beginning of progress report period: November 16, 2022 End of progress report period: November 23, 2022  Today's Date: 11/23/2022   Patient has met 1 of 4 short term goals.  Pt making incremental progress toward OT goals with pt continuing to present with impairments in postural control, awareness, L hemiplegia, and left neglect. Pt currently requires total A +2 for squat pivot transfers. MAX A- total A for ADLs. Pt has mafe mild improvements in response time when completing functional tasks. DC date set for 8/6,continue OT POC.   Patient continues to demonstrate the following deficits: muscle weakness, decreased cardiorespiratoy endurance, impaired timing and sequencing, unbalanced muscle activation, decreased coordination, and decreased motor planning, decreased visual perceptual skills, decreased attention to left, decreased attention to right, and decreased motor planning, decreased initiation, decreased attention, decreased awareness, decreased problem solving, decreased safety awareness, and decreased memory, and decreased sitting balance, decreased standing balance, decreased postural control, and hemiplegia and therefore will continue to benefit from skilled OT intervention to enhance overall performance with BADL and Reduce care partner burden.  Patient progressing toward long term goals..  Continue plan of care.  OT Short Term Goals Week 1:  OT Short Term Goal 1 (Week 1): Pt will be able to maintain static sitting EOB with mod A or less A to enable him to safely sit on a BSC. OT Short Term Goal 1 - Progress (Week 1): Progressing toward goal OT Short Term Goal 2 (Week 1): Pt will demonstrate increased postural awareness to recognize with mod cues when he is leaning/falling back or to his L in unsupported sitting. OT Short Term Goal 2 - Progress (Week 1): Progressing  toward goal OT Short Term Goal 3 (Week 1): Pt will demonstrate improved L side awareness to wash left arm with min cues. OT Short Term Goal 3 - Progress (Week 1): Progressing toward goal OT Short Term Goal 4 (Week 1): Pt will be able to self feed with min A. OT Short Term Goal 4 - Progress (Week 1): Met Week 2:  OT Short Term Goal 1 (Week 2): Pt will demonstrate improved L side awareness to wash left arm with min cues. OT Short Term Goal 2 (Week 2): Pt will demonstrate increased postural awareness to recognize with mod cues when he is leaning/falling back or to his L in unsupported sitting. OT Short Term Goal 3 (Week 2): pt will initiate proper placement of LUE during transfers for 3/3 consecutive sessions  Therapy Documentation Precautions:  Precautions Precautions: Fall Precaution Comments: dense L hemi with inability to initiate motor control with R hemibody, easily distracted Restrictions Weight Bearing Restrictions: No    Therapy/Group: Individual Therapy  Pollyann Glen [facility] 11/23/2022, 4:09 PM

## 2022-11-23 NOTE — Progress Notes (Signed)
Physical Therapy Session Note  Patient Details  Name: Samuel Pearson MRN: [id number] Date of Birth: 1945/06/16  Today's Date: 11/23/2022 PT Individual Time: 7829-5621 PT Individual Time Calculation (min): 50 min  Short Term Goals: Week 1:  PT Short Term Goal 1 (Week 1): Pt will perform all aspects of bedmobility with consistent MaxA. PT Short Term Goal 1 - Progress (Week 1): Progressing toward goal PT Short Term Goal 2 (Week 1): Pt will perform sit<>stand with MaxA +1 PT Short Term Goal 2 - Progress (Week 1): Progressing toward goal PT Short Term Goal 3 (Week 1): Pt will perform seat to seat transfers with MaxA +1 PT Short Term Goal 3 - Progress (Week 1): Progressing toward goal PT Short Term Goal 4 (Week 1): Pt will initiate sitting balance and maintain with MaxA+1. PT Short Term Goal 4 - Progress (Week 1): Progressing toward goal PT Short Term Goal 5 (Week 1): Pt will initiate gait training. PT Short Term Goal 5 - Progress (Week 1): Progressing toward goal Week 2:  PT Short Term Goal 1 (Week 2): Pt will perform all aspects of bed mobility with consistent MaxA. PT Short Term Goal 2 (Week 2): Pt will perform sit<>stand with MaxA +1 PT Short Term Goal 3 (Week 2): Pt will perform seat to seat transfers with MaxA +1 PT Short Term Goal 4 (Week 2): Pt will initiate sitting balance and maintain with MaxA+1. PT Short Term Goal 5 (Week 2): Pt will initiate gait training.  Skilled Therapeutic Interventions/Progress Updates:  Patient seated upright in TIS w/c with slight back tilt on entrance to room. Patient alert and agreeable to PT session. Has cookie in front of him and is able to follow instruction to place cookie back in protective wrapper. He is also able to follow instruction to push tray table away and does so with RUE. No movement noted in LUE despite wife and dtr stating that he picked up entire arm 3x the previous afternoon.   Patient with no pain complaint at start of session.  New  w/c provided by w/c rep requires adjustment to headrest for proper fit. Pt continues to demo need for TIS w/c and may be candidate for [employer] TIS w/c.   Pt very slow to process/ respond with motor activation this session. Very flat affect. Does not respond as quickly as day prior with hand gestures. 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 Very flat affect.   Neuromuscular Re-ed: NMR facilitated during session with focus on sitting balance, motor planning and execution, following instructions. Pt guided in activity with push/ pull of therapist on rolling stool in order to re-familiarize with difference in activity with hope for carryover to push vs pull on bar using STEDY. Reaching activity to anterior and to R sides in order to promote trunk control but reduced activation to trunk this session requiring MaxA to mobilize trunk. NMR performed for improvements in motor control and coordination, balance, sequencing, judgement, and self confidence/ efficacy in performing all aspects of mobility at highest level of independence.   Therapeutic Activity: Transfers: Pt performed sit<>stand in STEDY and requiring increased assist to bring hips to bar. Despite NMR for push/ pull and vc for pull, pt unable to correctly execute needed movement and pushes with elbow extension into bar.   Bed Mobility: Pt performed supine <> sit with  MaxA +2. VC/ tc required for lowering to R elbow, then shoulder. Very slow to follow instructions/ process for assist toward [facility] requiring MaxA +2.  Patient supine in bed at end of session with brakes locked, bed alarm set, and all needs within reach. NT notified as to pt's disposition.    Therapy Documentation Precautions:  Precautions Precautions: Fall Precaution Comments: dense L hemi with inability  to initiate motor control with R hemibody, easily distracted Restrictions Weight Bearing Restrictions: No General:   Vital Signs:   Pain:  No pain related this session.   Therapy/Group: Individual Therapy  [name] PT, DPT, CSRS 11/23/2022, 5:47 PM

## 2022-11-23 NOTE — Progress Notes (Signed)
Occupational Therapy Session Note  Patient Details  Name: Samuel Pearson MRN: [id number] Date of Birth: [birth year]  Today's Date: 11/23/2022 OT Individual Time: 9629-5284  OT Individual Time Calculation (min): 59 min     Short Term Goals: Week 1:  OT Short Term Goal 1 (Week 1): Pt will be able to maintain static sitting EOB with mod A or less A to enable him to safely sit on a BSC. OT Short Term Goal 1 - Progress (Week 1): Progressing toward goal OT Short Term Goal 2 (Week 1): Pt will demonstrate increased postural awareness to recognize with mod cues when he is leaning/falling back or to his L in unsupported sitting. OT Short Term Goal 2 - Progress (Week 1): Met OT Short Term Goal 3 (Week 1): Pt will demonstrate improved L side awareness to wash left arm with min cues. OT Short Term Goal 4 (Week 1): Pt will be able to self feed with min A.  Skilled Therapeutic Interventions/Progress Updates:  Session 1:  Pt greeted seated in w/c, pt agreeable to OT intervention. Total A transport to gym in w/c. Pt completed squat pivot to mat table to R side with total A +2. Session focused on sitting balance with pt positioned beside wall on R side with an emphasis on maintaining midline orientation with pt given visual/tactile cue of keep R shoulder pressed against wall. Pt with delayed processing needing increased time to follow commands. Pt did have brief moment of CGA for static sitting balance with R shoulder against wall and R hand holding onto edge of mat table. 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   Pt completed squat pivot back to TIS to L side with total A +2.  1:1 NMES applied to L wrist flexors at the below settings:    Ratio 1:3 Rate 35 pps Waveform- Asymmetric Ramp 1.0 Pulse 300 Intensity- 23  Duration -   8 mins  No pain or adverse reaction noted.  Ended session with pt seated in w/c with all needs within  reach and safety belt alarm activated.                           Therapy Documentation Precautions:  Precautions Precautions: Fall Precaution Comments: dense L hemi with inability to initiate motor control with R hemibody, easily distracted Restrictions Weight Bearing Restrictions: No    Pain:   no pain reported    Therapy/Group: Individual Therapy  Pollyann Glen [facility] 11/23/2022, 12:21 PM

## 2022-11-23 NOTE — Progress Notes (Addendum)
Physical Therapy Session Note  Patient Details  Name: Samuel Pearson MRN: [id number] Date of Birth: 1946/03/30  Today's Date: 11/22/2022 PT Individual Time: 7829-5621 PT Individual Time Calculation (min): 36 min  Short Term Goals: Week 1:  PT Short Term Goal 1 (Week 1): Pt will perform all aspects of bedmobility with consistent MaxA. PT Short Term Goal 1 - Progress (Week 1): Progressing toward goal PT Short Term Goal 2 (Week 1): Pt will perform sit<>stand with MaxA +1 PT Short Term Goal 2 - Progress (Week 1): Progressing toward goal PT Short Term Goal 3 (Week 1): Pt will perform seat to seat transfers with MaxA +1 PT Short Term Goal 3 - Progress (Week 1): Progressing toward goal PT Short Term Goal 4 (Week 1): Pt will initiate sitting balance and maintain with MaxA+1. PT Short Term Goal 4 - Progress (Week 1): Progressing toward goal PT Short Term Goal 5 (Week 1): Pt will initiate gait training. PT Short Term Goal 5 - Progress (Week 1): Progressing toward goal Week 2:  PT Short Term Goal 1 (Week 2): Pt will perform all aspects of bed mobility with consistent MaxA. PT Short Term Goal 2 (Week 2): Pt will perform sit<>stand with MaxA +1 PT Short Term Goal 3 (Week 2): Pt will perform seat to seat transfers with MaxA +1 PT Short Term Goal 4 (Week 2): Pt will initiate sitting balance and maintain with MaxA+1. PT Short Term Goal 5 (Week 2): Pt will initiate gait training.  Skilled Therapeutic Interventions/Progress Updates:  Patient seated upright in TIS w/c on entrance to room. Patient alert and agreeable to PT session. UI noted and NT relates that he has not urinated yet this day. Related continued incontinence with reduced ability to relate need for continent void.   Patient with no pain complaint at start of session.  Therapeutic Activity: Transfers: Pt performed sit>stand transfer to STEDY requiring MaxA +2. Continued L lean requring MaxA during transfer to bed. Provided vc/ tc  throughout for decreasing push to bar and changing to pull as well as to lean to R side. Pt encouraged to count to three in order to initiate all transfers. From perch seat on STEDY, pt is able to initiate power up requiring MinA but then pushes with RUE. Hand placement adjust to underhand hold to bedrail to encourage pull but pt released grasp and changed grip. With cue to slowly lower, pt is able to perform slow descent to seat with MinA.   Bed Mobility: Pt performed sit to supine continuing to require vc/ tc for sequencing to elbow and then to shoulder to R side with MinA. MaxA for BLE to bed surface. Boost to [facility] with ModA +2 with pt assisting with RLE. Encouraged bridging with BLE in order to assist with doffing shorts. MaxA required to perform with hold to LLE in hooklying.   Patient supine in bed at end of session with brakes locked, bed alarm set, and all needs within reach. NT notified as to pt's position and NT initiates patient care to change soiled brief.   Therapy Documentation Precautions:  Precautions Precautions: Fall Precaution Comments: dense L hemi with inability to initiate motor control with R hemibody, easily distracted Restrictions Weight Bearing Restrictions: No General:   Vital Signs:  Pain:  No pain related this session.    Therapy/Group: Individual Therapy  [name] PT, DPT, CSRS 11/22/2022, 7:34 AM

## 2022-11-24 MED ORDER — DIPHENHYDRAMINE-ZINC ACETATE 2-0.1 % EX CREA
TOPICAL_CREAM | Freq: Three times a day (TID) | CUTANEOUS | Status: DC | PRN
  Filled 2022-11-24 (×2): qty 28

## 2022-11-24 NOTE — Progress Notes (Signed)
Dois Davenport PA made aware of pt rash on groin and back. Pt c/o itchiness at the sites. No new orders at this time. Mylo Red, LPN

## 2022-11-24 NOTE — Progress Notes (Signed)
Asked by nursing staff to evaluate skin lesiona on right lower extremity. Patient is OOB in [facility] and wife is at bedside. Patient is in short pants today and wife says this is the first they have noticed the lesions. His has approximately 4-5 erythematous, slightly nodular lesions along his anterior distal thigh and lateral right knee. He says they do not itch or burn, but "hurt" when he scratches them with his fingernails. Not tender to palpation. I see no other rash or skin lesions of his LLE, BUE, chest, abdomen or neck. Unclear etiology. Continue to observe.

## 2022-11-24 NOTE — Progress Notes (Signed)
Physical Therapy Session Note  Patient Details  Name: Samuel Pearson MRN: [id number] Date of Birth: 03-27-1946  Today's Date: 11/24/2022 PT Individual Time: 2841-3244 PT Individual Time Calculation (min): 72 min   Short Term Goals: Week 1:  PT Short Term Goal 1 (Week 1): Pt will perform all aspects of bedmobility with consistent MaxA. PT Short Term Goal 1 - Progress (Week 1): Progressing toward goal PT Short Term Goal 2 (Week 1): Pt will perform sit<>stand with MaxA +1 PT Short Term Goal 2 - Progress (Week 1): Progressing toward goal PT Short Term Goal 3 (Week 1): Pt will perform seat to seat transfers with MaxA +1 PT Short Term Goal 3 - Progress (Week 1): Progressing toward goal PT Short Term Goal 4 (Week 1): Pt will initiate sitting balance and maintain with MaxA+1. PT Short Term Goal 4 - Progress (Week 1): Progressing toward goal PT Short Term Goal 5 (Week 1): Pt will initiate gait training. PT Short Term Goal 5 - Progress (Week 1): Progressing toward goal Week 2:  PT Short Term Goal 1 (Week 2): Pt will perform all aspects of bed mobility with consistent MaxA. PT Short Term Goal 2 (Week 2): Pt will perform sit<>stand with MaxA +1 PT Short Term Goal 3 (Week 2): Pt will perform seat to seat transfers with MaxA +1 PT Short Term Goal 4 (Week 2): Pt will initiate sitting balance and maintain with MaxA+1. PT Short Term Goal 5 (Week 2): Pt will initiate gait training.  Skilled Therapeutic Interventions/Progress Updates: Patient in TIS on entrance to room. Patient alert and agreeable to PT session.   Patient reported no pain at beginning of session.   Therapeutic Activity: Bed Mobility: Pt performed sit to supine from EOB with maxA and cues to use R UE on R HOB rail to brace at end of session per pt report of needing to urinate (brief noted to be soiled). Transfers: Pt performed sit to stand in // bars from TIS with rehab tech behind using sheet over pt's trunk pulling into extension in  order to keep pt upright. PTA in front providing maxA to anteriorly scoot (pt cued to use R UE to pull self towards edge). Pt cued to count to 3 and pull self up in bars. Pt required maxA with PTA providing R LE block (per presentation of pt excessively pushing to L with R LE), and blocking R knee from buckling. PTA use of B UE's on pt's posterior hips to assist in achieving hip extension (max/totalA) with cues for patient to use R UE to pull self to center R per heavy L lean, and to engage R LE into assisting with stand. Pt demos a brief moment of increased movement to center with less assistance (still maxA, but slightly less). Pt sat back in TIS with totalA to control eccentric, and totalA to scoot back (TIS tilted back to assist). Pt then stated to have to urinate. Pt transported back to room and was transferred from TIS to EOB with STEDY. Pt cued to count to 3 and to use R UE to pull self up (modA) and was transported +2 with heavy lean on L. Pt then minA to stand from STEDY pads, and maxA to control eccentric).   Neuromuscular Re-ed: NMR facilitated during session with focus on postural control and core neuromuscular activation. - Pt initially in main room gym performing R and L lateral leans with cues to grab green (pt favorite color) cone from rehab tech's hand on  R, and then to cross midline to pass to PTA on L. Pt required increased time and effort to performs tasks and became easily distractible as gym became busier. Pt transported to ADL apartment with door closed (did not leave TIS). Pt then cued to perform same task with cone but still required increased effort to stay on task despite being in minimally distractible room. Pt then cued to use 1lb weighted bar to increase weighted feedback for cognitive engagement with cues to hold bar at far end and then tap tech's hand on R side, and PTA hand on L (far enough for pt to lean). Pt still stuck in L lean (PTA grabbed pillow and placed it between pt's  back and TIS back of chair in order to promote upright posture, and less reliance on TIS L bar for support). Pt performed task again but still noted to have decreased lean on B sides to achieve goal. Pt also noted to want to use L UE (currently flaccid) during intervention. PTA used coban to wrap pt's L hand on weight bar on one end, and cued pt to use R UE as AAROM. Pt this time instructed to row bar like a canoe to knock over cone on floor with PTA providing modA. B arms to TIS moved away to take away lateral stability even more with patient performing same task with tech and PTA on either side for safety. Pt required max/heavy maxA to lean towards either side (noted movements of increased self movement, but still maxA). Pt then corrected PTA by stating that this was more like "kayaking than canoeing."  NMR performed for improvements in motor control and coordination, balance, sequencing, judgement, and self confidence/ efficacy in performing all aspects of mobility at highest level of independence.   Patient supine in bed with NT present to provide personal care at end of session with brakes locked, bed alarm set, and all needs within reach.      Therapy Documentation Precautions:  Precautions Precautions: Fall Precaution Comments: dense L hemi with inability to initiate motor control with R hemibody, easily distracted Restrictions Weight Bearing Restrictions: No  Therapy/Group: Individual Therapy  Jiovany Scheffel PTA 11/24/2022, 3:53 PM

## 2022-11-24 NOTE — Progress Notes (Signed)
Speech Language Pathology Daily Session Note  Patient Details  Name: Samuel Pearson MRN: [id number] Date of Birth: 27-Dec-1945  Today's Date: 11/24/2022 SLP Individual Time: 1103-1203 SLP Individual Time Calculation (min): 60 min  Short Term Goals: Week 2: SLP Short Term Goal 1 (Week 2): Patient will demonstrate orientation to time with min multimodal cues SLP Short Term Goal 2 (Week 2): Patient will answer mildly complex problem solving questions with min multimodal A SLP Short Term Goal 3 (Week 2): Patient will utilize memory compensatory strategies with min multimodal A SLP Short Term Goal 4 (Week 2): Patient will demonstrate selective/sutained attention for >15 minutes with min multimodal A SLP Short Term Goal 5 (Week 2): Patient will initiate verbal responses in 75% of opportunities with min verbal cues SLP Short Term Goal 6 (Week 2): Patient will demonstrate intellectual awareness by verbalizing two physical and two cognitive impairments mod multimodal A  Skilled Therapeutic Interventions: Skilled therapy session focused on cognitive re-training. SLP addressed awareness by proving mod cues for patient to recall cognitive and physical changes since stroke. Patient accurately identified changes such as "L sided weakness/neglect, slower response times, and mood changes." SLP facilitated verbal responses through categorical naming and direction tasks. Patient was timed while naming items in a category. He recalled functional items quickly, however took more time to process items such as "types of stocks." He required SLP to redirection attention 1x. Patient continues to demonstrate functional progress regarding response times, awareness skills, and attention. Patient was left in chair with alarm set and call bell in reach. Wife was present and participatory in session. Continue with POC.   Pain Denies Pain  Therapy/Group: Individual Therapy  Darriona Dehaas M.A., CF-SLP 11/24/2022, 2:49  PM

## 2022-11-24 NOTE — Progress Notes (Signed)
PROGRESS NOTE   Subjective/Complaints:   Pt states he worked a lot with PT Budd Palmer yesterday  He wonders why his stroke did not cause pain and we discussed this   ROS: Limited due to cognitive/behavioral but denies pain   Objective:   No results found. No results for input(s): "WBC", "HGB", "HCT", "PLT" in the last 72 hours.   No results for input(s): "NA", "K", "CL", "CO2", "GLUCOSE", "BUN", "CREATININE", "CALCIUM" in the last 72 hours.    Intake/Output Summary (Last 24 hours) at 11/24/2022 0719 Last data filed at 11/23/2022 1832 Gross per 24 hour  Intake 240 ml  Output --  Net 240 ml        Physical Exam: Vital Signs Blood pressure (!) 165/52, pulse 80, temperature 98.5 F (36.9 C), temperature source Oral, resp. rate 15, SpO2 95%.  HEENT- neg rhinorrhea, neg pain with sinus pressure over frontal and maxillary region  General: No acute distress Mood and affect are appropriate Heart: Regular rate and rhythm no rubs murmurs or extra sounds Lungs: Clear to auscultation, breathing unlabored, no rales or wheezes Abdomen: Positive bowel sounds, soft nontender to palpation, nondistended Extremities: No clubbing, cyanosis, or edema Skin: No evidence of breakdown, no evidence of rash   Skin: No evidence of breakdown, no evidence of rash Neurologic: continues with delayed responses but oriented to person , place and situation , can respond to basic questions with extra time. motor strength is 5/5 in RIght and 0/5 left deltoid, bicep, tricep, grip, hip flexor, knee extensors, ankle dorsiflexor and plantar flexor but very deliberate. Sensory exam normal sensation to LT in bilateral upper and lower extremities  Musculoskeletal: Full range of motion in all 4 extremities. No joint swelling   Assessment/Plan: 1. Functional deficits which require 3+ hours per day of interdisciplinary therapy in a comprehensive inpatient rehab  setting. Physiatrist is providing close team supervision and 24 hour management of active medical problems listed below. Physiatrist and rehab team continue to assess barriers to discharge/monitor patient progress toward functional and medical goals  Care Tool:  Bathing    Body parts bathed by patient: Chest, Abdomen, Face   Body parts bathed by helper: Left arm, Right arm, Front perineal area, Buttocks, Left upper leg, Right upper leg, Right lower leg, Left lower leg     Bathing assist Assist Level: Maximal Assistance - Patient 24 - 49%     Upper Body Dressing/Undressing Upper body dressing   What is the patient wearing?: Pull over shirt    Upper body assist Assist Level: 2 Helpers    Lower Body Dressing/Undressing Lower body dressing      What is the patient wearing?: Incontinence brief, Pants     Lower body assist Assist for lower body dressing: 2 Helpers     Toileting Toileting    Toileting assist Assist for toileting: Dependent - Patient 0%     Transfers Chair/bed transfer  Transfers assist  Chair/bed transfer activity did not occur: Safety/medical concerns  Chair/bed transfer assist level: Dependent - mechanical lift     Locomotion Ambulation   Ambulation assist   Ambulation activity did not occur: Safety/medical concerns  Walk 10 feet activity   Assist  Walk 10 feet activity did not occur: Safety/medical concerns        Walk 50 feet activity   Assist Walk 50 feet with 2 turns activity did not occur: Safety/medical concerns         Walk 150 feet activity   Assist Walk 150 feet activity did not occur: Safety/medical concerns         Walk 10 feet on uneven surface  activity   Assist Walk 10 feet on uneven surfaces activity did not occur: Safety/medical concerns         Wheelchair     Assist Is the patient using a wheelchair?: Yes Type of Wheelchair: Manual Wheelchair activity did not occur:  Safety/medical concerns         Wheelchair 50 feet with 2 turns activity    Assist    Wheelchair 50 feet with 2 turns activity did not occur: Safety/medical concerns       Wheelchair 150 feet activity     Assist  Wheelchair 150 feet activity did not occur: Safety/medical concerns       Blood pressure (!) 165/52, pulse 80, temperature 98.5 F (36.9 C), temperature source Oral, resp. rate 15, SpO2 95%.  Medical Problem List and Plan: 1. 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  Antithrombotics: -DVT/anticoagulation:  Pharmaceutical:             left tib and peroneal DVTs, started on Eliquis, pt asymptomatic ,Repeat doppler showing resolution of calf DVT cont Lovenox - no f/u doppler needed             -antiplatelet therapy: Aspirin and Plavix for three weeks followed by Plavix alone.  Last dose of aspirin will be 7/29   3. Pain Management: Tylenol as needed   4. Mood/Behavior/Sleep: LCSW to evaluate and provide emotional support             -antipsychotic agents: n/a Add low dose ritalin for initiation issues    5. Neuropsych/cognition: This patient is not capable of making decisions on his own behalf.             -pt has hx of ETOH abuse, 2-3 beers + wine most nights 6. Skin/Wound Care: Routine skin care checks   7. Fluids/Electrolytes/Nutrition: Routine Is and Os and follow-up chemistries Monday             -continue MVI, thiamine   8: Hypertension: monitor TID and prn (home meds: lisinopril, Lopressor)             -continue amlodipine 5 mg daily Vitals:   11/23/22 1923 11/24/22 0543  BP: (!) 151/52 (!) 165/52  Pulse: 87 80  Resp: 18  15  Temp: 98.4 F (36.9 C) 98.5 F (36.9 C)  SpO2: 96% 95%  Sys BP elevated 7/18, may need to increase amlodipine if this persists              9: Hyperlipidemia: continue statin  10: Prediabetes: ? diet controlled; A1c = 5.7%   11: Macrocytic anemia/B12 deficiency: follow-up CBC- Hgb normal /improved 7/15             -hx of pernicious anemia; PCP rec: daily B12    Latest Ref Rng & Units 11/21/2022    6:27 AM 11/15/2022    5:46 AM 11/13/2022    5:25 AM  CBC  WBC 4.0 - 10.5 K/uL 9.7  7.7  10.9   Hemoglobin 13.0 - 17.0 g/dL 16.1  09.6  04.5   Hematocrit 39.0 - 52.0 % 40.6  35.9  33.6   Platelets 150 - 400 K/uL 283  214  176     12. Ileus:Resolved 7/16             -moving bowels now. Last BM 7/15             -senna-s at bedtime             -PPI, prn simethicone 13. 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 Thyroid nodule-TFT's essentially WNL             -f/u as outpt, thyroid U/S, etc.     LOS: 10 days A FACE TO FACE EVALUATION WAS PERFORMED  Erick Colace 11/24/2022, 7:19 AM

## 2022-11-24 NOTE — Progress Notes (Signed)
Occupational Therapy Session Note  Patient Details  Name: Samuel Pearson MRN: [id number] Date of Birth: [birth year]  Today's Date: 11/24/2022 OT Individual Time: 2841-3244 OT Individual Time Calculation (min): 63 min    Short Term Goals: Week 2:  OT Short Term Goal 1 (Week 2): Pt will demonstrate improved L side awareness to wash left arm with min cues. OT Short Term Goal 2 (Week 2): Pt will demonstrate increased postural awareness to recognize with mod cues when he is leaning/falling back or to his L in unsupported sitting. OT Short Term Goal 3 (Week 2): pt will initiate proper placement of LUE during transfers for 3/3 consecutive sessions  Skilled Therapeutic Interventions/Progress Updates:  Pt greeted supine in bed, pt agreeable to OT intervention. Session focus on BADL reeducation, functional mobility, sitting balance and decreasing overall caregiver burden.     Noted brief to be wet, worked on rolling R<>L for brief change/ LB dressing with an emphasis on initiating movement as pt continues to be slow to initiate transitional movements. Total A for brief change, noted rash at pts L hip, alerted LPN who came in during session to assess skin irritation.   Worked on shifting trunk forward with LUE placed on bed rail for UB dressing, pt able to initiate pulling forward on bed rail but ultimately needed MAX A from tech while therapist donned [place] shirt. Pt required MAX A +2 for bed mobility with pt needing cues to recall hemi technique of hooking BLEs to transition to EOB. Once EOB, pt required MAX A +1 for static sitting balance. Utilized stedy for OOB transfer with an emphasis on initiating movements and improved motor planning. Pt continues to require MAX A +2 to stand to stedy d/t heavy L lean.   Once in TIS, pt able to complete oral care at sink with set- up assist.   1:1  unattended NMES applied to L wrist extensors at the below settings:    Ratio 1:3 Rate 35 pps Waveform-  Asymmetric Ramp 1.0 Pulse 300 Intensity- 30   Duration -   30 mins  Returned 30 mins later to remove estim with no skin irration noted or adverse reactions.             Ended session with pt seated  in TIS with all needs within reach and safety belt alarm activated.                    Therapy Documentation Precautions:  Precautions Precautions: Fall Precaution Comments: dense L hemi with inability to initiate motor control with R hemibody, easily distracted Restrictions Weight Bearing Restrictions: No    Pain: No pain reported during session    Therapy/Group: Individual Therapy  Pollyann Glen [facility] 11/24/2022, 12:11 PM

## 2022-11-25 ENCOUNTER — Other Ambulatory Visit (HOSPITAL_COMMUNITY): Payer: Self-pay

## 2022-11-25 MED ORDER — PREDNISONE 5 MG (21) PO TBPK
10.0000 mg | ORAL_TABLET | Freq: Every evening | ORAL | Status: AC
  Administered 2022-11-25: 10 mg via ORAL

## 2022-11-25 MED ORDER — PREDNISONE 5 MG (21) PO TBPK
5.0000 mg | ORAL_TABLET | ORAL | Status: AC
  Administered 2022-11-25: 5 mg via ORAL

## 2022-11-25 MED ORDER — PREDNISONE 5 MG (21) PO TBPK
5.0000 mg | ORAL_TABLET | Freq: Four times a day (QID) | ORAL | Status: AC
  Administered 2022-11-27 – 2022-11-30 (×10): 5 mg via ORAL

## 2022-11-25 MED ORDER — ASPIRIN 81 MG PO TBEC
81.0000 mg | DELAYED_RELEASE_TABLET | Freq: Every day | ORAL | Status: DC
  Administered 2022-11-26 – 2022-12-09 (×14): 81 mg via ORAL
  Filled 2022-11-25 (×14): qty 1

## 2022-11-25 MED ORDER — PREDNISONE 5 MG (21) PO TBPK
10.0000 mg | ORAL_TABLET | Freq: Every morning | ORAL | Status: AC
  Administered 2022-11-25: 10 mg via ORAL
  Filled 2022-11-25: qty 21

## 2022-11-25 MED ORDER — PREDNISONE 5 MG (21) PO TBPK
5.0000 mg | ORAL_TABLET | Freq: Three times a day (TID) | ORAL | Status: AC
  Administered 2022-11-26 (×3): 5 mg via ORAL

## 2022-11-25 MED ORDER — PREDNISONE 5 MG (21) PO TBPK
10.0000 mg | ORAL_TABLET | Freq: Every evening | ORAL | Status: AC
  Administered 2022-11-26: 10 mg via ORAL

## 2022-11-25 NOTE — Progress Notes (Signed)
Speech Language Pathology Daily Session Note  Patient Details  Name: Samuel Pearson MRN: [id number] Date of Birth: 1946/01/16  Today's Date: 11/25/2022 SLP Individual Time: 1103-1203 SLP Individual Time Calculation (min): 60 min  Short Term Goals: Week 2: SLP Short Term Goal 1 (Week 2): Patient will demonstrate orientation to time with min multimodal cues SLP Short Term Goal 2 (Week 2): Patient will answer mildly complex problem solving questions with min multimodal A SLP Short Term Goal 3 (Week 2): Patient will utilize memory compensatory strategies with min multimodal A SLP Short Term Goal 4 (Week 2): Patient will demonstrate selective/sutained attention for >15 minutes with min multimodal A SLP Short Term Goal 5 (Week 2): Patient will initiate verbal responses in 75% of opportunities with min verbal cues SLP Short Term Goal 6 (Week 2): Patient will demonstrate intellectual awareness by verbalizing two physical and two cognitive impairments mod multimodal A  Skilled Therapeutic Interventions: Skilled therapy session focused on cognitive retraining. At the beginning of the session, patient able to utilize calendar to correctly state day of the week, date, and month. SLP addressed memory goals through auditory comprehension tasks. Patient watched video about the Olympic[place] in [place] (a topic of interest) and was asked comprehension questions. Patient required supervision A to answer questions with 90% accuracy. After completion of video, patient verbalized Olympic sports he would like to watch with min A. Patient created a TV watch schedule for those sporting events with mod A. Patient required increased cueing to complete this task due to poor selective attention this date. Patient required SLP to provide choices of events streaming at each time. Patient left in bed with alarm set and call bell in reach. Wife present throughout. Continue POC.  Pain Pain Assessment Pain Score: 0-No  pain  Therapy/Group: Individual Therapy  Tauna Macfarlane M.A., CF-SLP 11/25/2022, 12:42 PM

## 2022-11-25 NOTE — Progress Notes (Signed)
PROGRESS NOTE   Subjective/Complaints:   No pain c/os, breathing ok Feels itchy all over but mainly back   ROS: Limited due to cognitive/behavioral but denies pain   Objective:   No results found. No results for input(s): "WBC", "HGB", "HCT", "PLT" in the last 72 hours.   No results for input(s): "NA", "K", "CL", "CO2", "GLUCOSE", "BUN", "CREATININE", "CALCIUM" in the last 72 hours.    Intake/Output Summary (Last 24 hours) at 11/25/2022 0851 Last data filed at 11/24/2022 1730 Gross per 24 hour  Intake 360 ml  Output --  Net 360 ml        Physical Exam: Vital Signs Blood pressure (!) 159/65, pulse 95, temperature 98 F (36.7 C), temperature source Oral, resp. rate 15, SpO2 95%.  HEENT- neg rhinorrhea, neg pain with sinus pressure over frontal and maxillary region  General: No acute distress Mood and affect are appropriate Heart: Regular rate and rhythm no rubs murmurs or extra sounds Lungs: Clear to auscultation, breathing unlabored, no rales or wheezes Abdomen: Positive bowel sounds, soft nontender to palpation, nondistended Extremities: No clubbing, cyanosis, or edema Skin: maculopapular rash inguinal folds, axillary folds and antecubital as well as trunk    Skin: No evidence of breakdown, no evidence of rash Neurologic: continues with delayed responses but oriented to person , place and situation , can respond to basic questions with extra time. motor strength is 5/5 in RIght and 0/5 left deltoid, bicep, tricep, grip, hip flexor, knee extensors, ankle dorsiflexor and plantar flexor but very deliberate. Sensory exam normal sensation to LT in bilateral upper and lower extremities  Musculoskeletal: Full range of motion in all 4 extremities. No joint swelling   Assessment/Plan: 1. Functional deficits which require 3+ hours per day of interdisciplinary therapy in a comprehensive inpatient rehab  setting. Physiatrist is providing close team supervision and 24 hour management of active medical problems listed below. Physiatrist and rehab team continue to assess barriers to discharge/monitor patient progress toward functional and medical goals  Care Tool:  Bathing    Body parts bathed by patient: Chest, Abdomen, Face   Body parts bathed by helper: Left arm, Right arm, Front perineal area, Buttocks, Left upper leg, Right upper leg, Right lower leg, Left lower leg     Bathing assist Assist Level: Maximal Assistance - Patient 24 - 49%     Upper Body Dressing/Undressing Upper body dressing   What is the patient wearing?: Pull over shirt    Upper body assist Assist Level: 2 Helpers    Lower Body Dressing/Undressing Lower body dressing      What is the patient wearing?: Incontinence brief, Pants     Lower body assist Assist for lower body dressing: 2 Helpers     Toileting Toileting    Toileting assist Assist for toileting: Dependent - Patient 0%     Transfers Chair/bed transfer  Transfers assist  Chair/bed transfer activity did not occur: Safety/medical concerns  Chair/bed transfer assist level: Dependent - mechanical lift     Locomotion Ambulation   Ambulation assist   Ambulation activity did not occur: Safety/medical concerns          Walk 10 feet activity  Assist  Walk 10 feet activity did not occur: Safety/medical concerns        Walk 50 feet activity   Assist Walk 50 feet with 2 turns activity did not occur: Safety/medical concerns         Walk 150 feet activity   Assist Walk 150 feet activity did not occur: Safety/medical concerns         Walk 10 feet on uneven surface  activity   Assist Walk 10 feet on uneven surfaces activity did not occur: Safety/medical concerns         Wheelchair     Assist Is the patient using a wheelchair?: Yes Type of Wheelchair: Manual Wheelchair activity did not occur:  Safety/medical concerns         Wheelchair 50 feet with 2 turns activity    Assist    Wheelchair 50 feet with 2 turns activity did not occur: Safety/medical concerns       Wheelchair 150 feet activity     Assist  Wheelchair 150 feet activity did not occur: Safety/medical concerns       Blood pressure (!) 159/65, pulse 95, temperature 98 F (36.7 C), temperature source Oral, resp. rate 15, SpO2 95%.  Medical Problem List and Plan: 1. 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  Antithrombotics: -DVT/anticoagulation:  Pharmaceutical:             left tib and peroneal DVTs, started on Eliquis, pt asymptomatic ,Repeat doppler showing resolution of calf DVT cont Lovenox - no f/u doppler needed             -antiplatelet therapy: Aspirin and Plavix for three weeks followed by Plavix alone.  Last dose of aspirin will be 7/29   3. Pain Management: Tylenol as needed   4. Mood/Behavior/Sleep: LCSW to evaluate and provide emotional support             -antipsychotic agents: n/a Add low dose ritalin for initiation issues    5. Neuropsych/cognition: This patient is not capable of making decisions on his own behalf.             -pt has hx of ETOH abuse, 2-3 beers + wine most nights 6. Skin/Wound Care:maculopapular rash has cinnamon allergy but does not think he had any, looked at med list will hold ritalin and lipitor, ask pharmacy to check med list as well.  ? If plavix may be cause, also recently started on Eliquis  Use prn benadryl start stera pred pack   7. Fluids/Electrolytes/Nutrition: Routine Is and Os and follow-up chemistries Monday              -continue MVI, thiamine   8: Hypertension: monitor TID and prn (home meds: lisinopril, Lopressor)             -continue amlodipine 5 mg daily Vitals:   11/24/22 1934 11/25/22 0434  BP: (!) 154/55 (!) 159/65  Pulse: 98 95  Resp: 16 15  Temp: 98.6 F (37 C)  98 F (36.7 C)  SpO2: 94% 95%  Sys BP elevated 7/19, may need to increase amlodipine if this persists              9: Hyperlipidemia: continue statin   10: Prediabetes: ? diet controlled; A1c = 5.7%   11: Macrocytic anemia/B12 deficiency: follow-up CBC- Hgb normal /improved 7/15             -hx of pernicious anemia; PCP rec: daily B12    Latest Ref Rng & Units 11/21/2022    6:27 AM 11/15/2022    5:46 AM 11/13/2022    5:25 AM  CBC  WBC 4.0 - 10.5 K/uL 9.7  7.7  10.9   Hemoglobin 13.0 - 17.0 g/dL 56.3  87.5  64.3   Hematocrit 39.0 - 52.0 % 40.6  35.9  33.6   Platelets 150 - 400 K/uL 283  214  176     12. Ileus:Resolved 7/16             -moving bowels now. Last BM 7/15             -senna-s at bedtime             -PPI, prn simethicone 13. 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 Thyroid nodule-TFT's essentially WNL             -f/u as outpt, thyroid U/S, etc.     LOS: 11 days A FACE TO FACE EVALUATION WAS PERFORMED  Erick Colace 11/25/2022, 8:51 AM

## 2022-11-25 NOTE — Progress Notes (Signed)
Occupational Therapy Session Note  Patient Details  Name: Samuel Pearson MRN: [id number] Date of Birth: 01-Aug-1945  Today's Date: 11/25/2022 OT Individual Time: 2725-3664 OT Individual Time Calculation (min): 60 min    Short Term Goals: Week 1:  OT Short Term Goal 1 (Week 1): Pt will be able to maintain static sitting EOB with mod A or less A to enable him to safely sit on a BSC. OT Short Term Goal 1 - Progress (Week 1): Progressing toward goal OT Short Term Goal 2 (Week 1): Pt will demonstrate increased postural awareness to recognize with mod cues when he is leaning/falling back or to his L in unsupported sitting. OT Short Term Goal 2 - Progress (Week 1): Progressing toward goal OT Short Term Goal 3 (Week 1): Pt will demonstrate improved L side awareness to wash left arm with min cues. OT Short Term Goal 3 - Progress (Week 1): Progressing toward goal OT Short Term Goal 4 (Week 1): Pt will be able to self feed with min A. OT Short Term Goal 4 - Progress (Week 1): Met Week 2:  OT Short Term Goal 1 (Week 2): Pt will demonstrate improved L side awareness to wash left arm with min cues. OT Short Term Goal 2 (Week 2): Pt will demonstrate increased postural awareness to recognize with mod cues when he is leaning/falling back or to his L in unsupported sitting. OT Short Term Goal 3 (Week 2): pt will initiate proper placement of LUE during transfers for 3/3 consecutive sessions  Skilled Therapeutic Interventions/Progress Updates:    Pt received in bed ready for therapy.  Focus of therapy session on postural control,    L side awareness.   ADL Retraining: - bed level UB bathing mod A, UB dressing max A with +2 to have pt lean forward in bed so his shirt could be adjusted. -total A LB self care with max A to roll side to side with max cues   Transfers: -unable to complete transfer to w/c as sit to stand attempted 5x with total A of 2 and pt not able to initiate movement with R side to push up  and leaning back. Pt not able to follow 1 step directions to lean forward.  Total A to lean forward and hold position.   Balance: -for a brief 30 seconds, pt did hold static sit with light CGA while using R hand on rail  Neuromuscular Re-Education:  -LUE hand over hand guiding with using hand to wash R arm and apply deoderant.  Pt resting in bed with all needs met. Alarm set and call light in reach.    Therapy Documentation Precautions:  Precautions Precautions: Fall Precaution Comments: dense L hemi with inability to initiate motor control with R hemibody, easily distracted Restrictions Weight Bearing Restrictions: No    Vital Signs:   Pain: Pain Assessment Pain Score: 0-No pain ADL: ADL Eating: Moderate assistance Grooming: Moderate assistance Upper Body Bathing: Moderate assistance Lower Body Bathing: Maximal assistance Upper Body Dressing: Maximal assistance Where Assessed-Upper Body Dressing: Bed level Lower Body Dressing: Dependent Where Assessed-Lower Body Dressing: Bed level Toileting: Unable to assess Toilet Transfer: Unable to assess  Therapy/Group: Individual Therapy  Aniston Christman 11/25/2022, 12:13 PM

## 2022-11-25 NOTE — Progress Notes (Signed)
Physical Therapy Session Note  Patient Details  Name: Samuel Pearson MRN: [id number] Date of Birth: 04-18-1946  Today's Date: 11/25/2022 PT Individual Time: 1302-1416 PT Individual Time Calculation (min): 74 min   Short Term Goals: Week 2:  PT Short Term Goal 1 (Week 2): Pt will perform all aspects of bed mobility with consistent MaxA. PT Short Term Goal 2 (Week 2): Pt will perform sit<>stand with MaxA +1 PT Short Term Goal 3 (Week 2): Pt will perform seat to seat transfers with MaxA +1 PT Short Term Goal 4 (Week 2): Pt will initiate sitting balance and maintain with MaxA+1. PT Short Term Goal 5 (Week 2): Pt will initiate gait training.  Skilled Therapeutic Interventions/Progress Updates: Patient in bed with HOB elevated on entrance to room. Patient alert and agreeable to PT session.   Patient reported no pain, only itchiness from rashes over body (staffing aware). Pt's wife alerted concerns about pt's care after d/c date, and how much assistance that can be provided with how involved pt is. PTA noted to pt's wife that the rehab team in pt's direct care will get together as soon as we all can to discuss, and that she will be kept updated.   Therapeutic Activity: Bed Mobility: Pt performed supine<>sit on EOB with maxA (multimodal cues for pt to use R UE to assist in truncal elevating by pushing into bed). Pt max/totalA to get from sitting on EOB to supine + 2. Pt maxA to adjust self in bed with cues to use R LE to push into bed to scoot to [facility] with + 2 pulling on chuck pad.  Transfers: Pt  from EOB to STEDY with increased time and effort for pt to find midline orientation per L lean, despite multiple avenues of cues used, pt required heavy max/totalA to get into standing + 2. Pt also cued to pull self up into STEDY by counting to 3 and then initiated some movement. Pt transported to TIS in room + 2 to avoid pt falling to L. Pt then maxA to stand from STEDY pads, and heavy maxA to sit in TIS  (totalA to posteriorly adjust pelvis in seat). Pt in TIS in front of mirror in room with cues to bring self past soap dispenser (pt reflection past soap dispenser on L and pt attempts to move with cue "bring your L eye over to R of dispenser" but was unable to do so without totalA). Pt transported to main gym for mirror NMRE.   Neuromuscular Re-ed: NMR facilitated during session with focus on postural control in sitting. - Pt sitting EOB performing trunk control with max/heavy maxA + 2. Pt cued to forward reach to PTA's hand on pt's L side per pt presentation of heavily leaning on the R. Pt required increased time and effort to initiate movement but attempts initiation with something done before (this case, tapping PTA hand with pt's hand vs initial cues for pt to bring R ear over to PTA's shoulder on R side). Pt also cued to visually scan for PTA's shoes on floor in order to promote anterior lean but pt still with max/heavy maxA + 2 in order to prevent pt from falling over to L.  - Pt cued to bring self evenly in mirror with green line down middle for attentive feedback. Despite max cues, pt did not initiate any attempts. Pt then cued to bring R UE over TIS arm rest on R to knock over cone on seat with 1 lb weighted  bar in R hand and external cues of red theraband pulling pt in opposite direction by PTA (cues also to bring nose over green theraband tied to R arm rest). Pt  initiates movement by bringing head forward and reaching R UE out, but unable to laterally lean further this session in comparison to yesterday's session.   NMR performed for improvements in motor control and coordination, balance, sequencing, judgement, and self confidence/ efficacy in performing all aspects of mobility at highest level of independence.   Patient supine in bed at end of session with brakes locked, bed alarm set, and all needs within reach.      Therapy Documentation Precautions:  Precautions Precautions:  Fall Precaution Comments: dense L hemi with inability to initiate motor control with R hemibody, easily distracted Restrictions Weight Bearing Restrictions: No   Therapy/Group: Individual Therapy  Kinzly Pierrelouis PTA 11/25/2022, 3:43 PM

## 2022-11-25 NOTE — TOC Benefit Eligibility Note (Signed)
Pharmacy Patient Advocate Encounter  Insurance verification completed.    The patient is insured through [facility] Medicare Part D  Ran test claim for Brilinta 90 mg and the current 30 day co-pay is $47.00.   This test claim was processed through [facility]- copay amounts may vary at other pharmacies due to pharmacy/plan contracts, or as the patient moves through the different stages of their insurance plan.    Roland Earl, CPHT Pharmacy Patient Advocate Specialist [facility] Patient Advocate Team Direct Number: [phone]  Fax: [phone]

## 2022-11-26 DIAGNOSIS — R21 Rash and other nonspecific skin eruption: Secondary | ICD-10-CM

## 2022-11-26 DIAGNOSIS — I69359 Hemiplegia and hemiparesis following cerebral infarction affecting unspecified side: Secondary | ICD-10-CM

## 2022-11-26 MED ORDER — AMLODIPINE BESYLATE 10 MG PO TABS
10.0000 mg | ORAL_TABLET | Freq: Every day | ORAL | Status: DC
  Administered 2022-11-27 – 2022-12-09 (×13): 10 mg via ORAL
  Filled 2022-11-26 (×13): qty 1

## 2022-11-26 NOTE — Progress Notes (Signed)
PROGRESS NOTE   Subjective/Complaints:  No events overnight. Vitals stable. Mild HTN 160s, sats 94-97% Last BM 7/19  - small.  Maculopapular like rash much improved since changing medications.  AVW:[id number] fevers, chills, N/V, abdominal pain, constipation, diarrhea, SOB, cough, chest pain, new weakness or paraesthesias.    Objective:   No results found. No results for input(s): "WBC", "HGB", "HCT", "PLT" in the last 72 hours.   No results for input(s): "NA", "K", "CL", "CO2", "GLUCOSE", "BUN", "CREATININE", "CALCIUM" in the last 72 hours.    Intake/Output Summary (Last 24 hours) at 11/26/2022 0918 Last data filed at 11/25/2022 1834 Gross per 24 hour  Intake 120 ml  Output 100 ml  Net 20 ml        Physical Exam: Vital Signs Blood pressure (!) 162/60, pulse 83, temperature 97.6 F (36.4 C), temperature source Oral, resp. rate 18, SpO2 96%.   General: No acute distress.  Sitting up in bed, surrounded by family. Mood and affect are appropriate Heart: Regular rate and rhythm no rubs murmurs or extra sounds Lungs: Clear to auscultation, breathing unlabored, no rales or wheezes Abdomen: Positive bowel sounds, soft nontender to palpation, nondistended Extremities: No clubbing, cyanosis, or edema Skin: maculopapular rash inguinal folds, axillary folds and antecubital as well as trunk    Skin: No evidence of breakdown, no evidence of rash Neurologic: continues with delayed responses but oriented to person , place and situation , can respond to basic questions with extra time.  Antigravity and against resistance in the right upper and lower extremity and 0/5 left deltoid, bicep, tricep, grip, hip flexor, knee extensors, ankle dorsiflexor and plantar flexor but very deliberate. Sensory exam normal sensation to LT in bilateral upper and lower extremities Musculoskeletal: Full range of motion in all 4 extremities. No joint  swelling   Assessment/Plan: 1. Functional deficits which require 3+ hours per day of interdisciplinary therapy in a comprehensive inpatient rehab setting. Physiatrist is providing close team supervision and 24 hour management of active medical problems listed below. Physiatrist and rehab team continue to assess barriers to discharge/monitor patient progress toward functional and medical goals  Care Tool:  Bathing    Body parts bathed by patient: Chest, Abdomen, Face   Body parts bathed by helper: Left arm, Right arm, Front perineal area, Buttocks, Left upper leg, Right upper leg, Right lower leg, Left lower leg     Bathing assist Assist Level: Maximal Assistance - Patient 24 - 49%     Upper Body Dressing/Undressing Upper body dressing   What is the patient wearing?: Pull over shirt    Upper body assist Assist Level: 2 Helpers    Lower Body Dressing/Undressing Lower body dressing      What is the patient wearing?: Incontinence brief, Pants     Lower body assist Assist for lower body dressing: 2 Helpers     Toileting Toileting    Toileting assist Assist for toileting: Dependent - Patient 0%     Transfers Chair/bed transfer  Transfers assist  Chair/bed transfer activity did not occur: Safety/medical concerns  Chair/bed transfer assist level: Dependent - mechanical lift     Locomotion Ambulation   Ambulation assist  Ambulation activity did not occur: Safety/medical concerns          Walk 10 feet activity   Assist  Walk 10 feet activity did not occur: Safety/medical concerns        Walk 50 feet activity   Assist Walk 50 feet with 2 turns activity did not occur: Safety/medical concerns         Walk 150 feet activity   Assist Walk 150 feet activity did not occur: Safety/medical concerns         Walk 10 feet on uneven surface  activity   Assist Walk 10 feet on uneven surfaces activity did not occur: Safety/medical concerns          Wheelchair     Assist Is the patient using a wheelchair?: Yes Type of Wheelchair: Manual Wheelchair activity did not occur: Safety/medical concerns         Wheelchair 50 feet with 2 turns activity    Assist    Wheelchair 50 feet with 2 turns activity did not occur: Safety/medical concerns       Wheelchair 150 feet activity     Assist  Wheelchair 150 feet activity did not occur: Safety/medical concerns       Blood pressure (!) 162/60, pulse 83, temperature 97.6 F (36.4 C), temperature source Oral, resp. rate 18, SpO2 96%.  Medical Problem List and Plan: 1. 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  Antithrombotics: -DVT/anticoagulation:  Pharmaceutical:             left tib and peroneal DVTs, started on Eliquis, pt asymptomatic ,Repeat doppler showing resolution of calf DVT cont Lovenox - no f/u doppler needed             -antiplatelet therapy: Aspirin and Plavix for three weeks followed by Plavix alone.  Last dose of aspirin will be 7/29   3. Pain Management: Tylenol as needed   4. Mood/Behavior/Sleep: LCSW to evaluate and provide emotional support             -antipsychotic agents: n/a Add low dose ritalin for initiation issues    5. Neuropsych/cognition: This patient is not capable of making decisions on his own behalf.             -pt has hx of ETOH abuse, 2-3 beers + wine most nights 6. Skin/Wound Care:maculopapular rash has cinnamon allergy but does not think he had any, looked at med list will hold ritalin and lipitor, ask pharmacy to check med list as well.  ? If plavix may be cause, also recently started on Eliquis  Use prn  benadryl start stera pred pack -7-20: Much improved with current regimen, monitor   7. Fluids/Electrolytes/Nutrition: Routine Is and Os and follow-up chemistries Monday             -continue MVI, thiamine   8: Hypertension: monitor TID and prn (home meds: lisinopril, Lopressor)             -continue amlodipine 5 mg  daily Vitals:   11/25/22 1938 11/26/22 0412  BP: (!) 153/59 (!) 162/60  Pulse: 95 83  Resp: 18 18  Temp: 98.7 F (37.1 C) 97.6 F (36.4 C)  SpO2: 94% 96%  Sys BP elevated 7/19, will increase amlodipine to 10 mg daily              9: Hyperlipidemia: continue statin   10: Prediabetes: ? diet controlled; A1c = 5.7%   11: Macrocytic anemia/B12 deficiency: follow-up CBC- Hgb normal /improved 7/15             -hx of pernicious anemia; PCP rec: daily B12    Latest Ref Rng & Units 11/21/2022    6:27 AM 11/15/2022    5:46 AM 11/13/2022    5:25 AM  CBC  WBC 4.0 - 10.5 K/uL 9.7  7.7  10.9   Hemoglobin 13.0 - 17.0 g/dL 06.3  01.6  01.0   Hematocrit 39.0 - 52.0 % 40.6  35.9  33.6   Platelets 150 - 400 K/uL 283  214  176     12. Ileus:Resolved 7/16             -moving bowels now. Last BM 7/15             -senna-s at bedtime             -PPI, prn simethicone 13. 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 Thyroid nodule-TFT's essentially WNL             -f/u as outpt, thyroid U/S, etc.     LOS: 12 days A FACE TO FACE EVALUATION WAS PERFORMED  Angelina Sheriff 11/26/2022, 9:18 AM

## 2022-11-26 NOTE — Plan of Care (Signed)
  Problem: Consults Goal: RH STROKE PATIENT EDUCATION Description: See Patient Education module for education specifics  Outcome: Progressing   Problem: RH BOWEL ELIMINATION Goal: RH STG MANAGE BOWEL WITH ASSISTANCE Description: STG Manage Bowel with min Assistance. Outcome: Progressing Goal: RH STG MANAGE BOWEL W/MEDICATION W/ASSISTANCE Description: STG Manage Bowel with Medication with mod I Assistance. Outcome: Progressing   Problem: RH BLADDER ELIMINATION Goal: RH STG MANAGE BLADDER WITH ASSISTANCE Description: STG Manage Bladder With min Assistance Outcome: Progressing   Problem: RH SAFETY Goal: RH STG ADHERE TO SAFETY PRECAUTIONS W/ASSISTANCE/DEVICE Description: STG Adhere to Safety Precautions With cues Assistance/Device. Outcome: Progressing   Problem: RH COGNITION-NURSING Goal: RH STG USES MEMORY AIDS/STRATEGIES W/ASSIST TO PROBLEM SOLVE Description: STG Uses Memory Aids/Strategies With cues Assistance to Problem Solve. Outcome: Progressing   Problem: RH KNOWLEDGE DEFICIT Goal: RH STG INCREASE KNOWLEDGE OF HYPERTENSION Description: Patient and spouse will be able to manage HTN with medications and dietary modifications using educational resources independendtly Outcome: Progressing Goal: RH STG INCREASE KNOWLEGDE OF HYPERLIPIDEMIA Description: Patient and spouse will be able to manage HLD with medications and dietary modifications using educational resources independendtly Outcome: Progressing Goal: RH STG INCREASE KNOWLEDGE OF STROKE PROPHYLAXIS Description: Patient and spouse will be able to manage secondary risks with medications and dietary modifications using educational resources independendtly Outcome: Progressing   Problem: Education: Goal: Knowledge of disease or condition will improve Outcome: Progressing Goal: Knowledge of secondary prevention will improve (MUST DOCUMENT ALL) Outcome: Progressing Goal: Knowledge of patient specific risk factors will  improve Loraine Leriche N/A or DELETE if not current risk factor) Outcome: Progressing   Problem: Ischemic Stroke/TIA Tissue Perfusion: Goal: Complications of ischemic stroke/TIA will be minimized Outcome: Progressing   Problem: Coping: Goal: Will verbalize positive feelings about self Outcome: Progressing Goal: Will identify appropriate support needs Outcome: Progressing   Problem: Health Behavior/Discharge Planning: Goal: Ability to manage health-related needs will improve Outcome: Progressing Goal: Goals will be collaboratively established with patient/family Outcome: Progressing   Problem: Self-Care: Goal: Ability to participate in self-care as condition permits will improve Outcome: Progressing Goal: Verbalization of feelings and concerns over difficulty with self-care will improve Outcome: Progressing Goal: Ability to communicate needs accurately will improve Outcome: Progressing   Problem: Nutrition: Goal: Risk of aspiration will decrease Outcome: Progressing Goal: Dietary intake will improve Outcome: Progressing

## 2022-11-26 NOTE — Progress Notes (Signed)
Speech Language Pathology Daily Session Note  Patient Details  Name: Samuel Pearson MRN: [id number] Date of Birth: 15-Jun-1945  Today's Date: 11/26/2022 SLP Individual Time: 0730-0830 SLP Individual Time Calculation (min): 60 min  Short Term Goals: Week 2: SLP Short Term Goal 1 (Week 2): Patient will demonstrate orientation to time with min multimodal cues SLP Short Term Goal 2 (Week 2): Patient will answer mildly complex problem solving questions with min multimodal A SLP Short Term Goal 3 (Week 2): Patient will utilize memory compensatory strategies with min multimodal A SLP Short Term Goal 4 (Week 2): Patient will demonstrate selective/sutained attention for >15 minutes with min multimodal A SLP Short Term Goal 5 (Week 2): Patient will initiate verbal responses in 75% of opportunities with min verbal cues SLP Short Term Goal 6 (Week 2): Patient will demonstrate intellectual awareness by verbalizing two physical and two cognitive impairments mod multimodal A  Skilled Therapeutic Interventions:  Patient was seen for skilled ST services targeting cognitive-communication.  Patient was lying in bed upon SLP arrival.  He demonstrated slowed processing when asked direct questions, sometimes responding with a gesture vs verbalization.  Patient was itching a red rash and was unable to recall what had caused his rash.  SLP spoke with nursing and it was reported rash was 2/2 Plavix.  After using repetition with patient, patient was able to recall at the end of session post 20-minute delay.   SLP encouraged increased processing speed by directing patient to repeat question that SLP asked (aloud or silently) before attempting to answer.  Patient reports this was beneficial for him.  He alluded his processing to re: " being in a [employer] where it is echoing and having to pull a book off the shelf where you do not know exactly where it is".  Patient was able to carry over repetition throughout the  session with rare cues from therapist.   Patient was left in room, all immediate needs within reach, and all safety measures activated.  Continue with current ST POC.   Pain Pain Assessment Pain Scale: 0-10 Pain Score: 0-No pain  Therapy/Group: Individual Therapy  Dorena Bodo 11/26/2022, 11:57 AM

## 2022-11-26 NOTE — Progress Notes (Signed)
Physical Therapy Session Note  Patient Details  Name: Samuel Pearson MRN: [id number] Date of Birth: October 29, 1945  Today's Date: 11/26/2022 PT Individual Time: 0915-1005 PT Individual Time Calculation (min): 50 min   Short Term Goals: Week 1:  PT Short Term Goal 1 (Week 1): Pt will perform all aspects of bedmobility with consistent MaxA. PT Short Term Goal 1 - Progress (Week 1): Progressing toward goal PT Short Term Goal 2 (Week 1): Pt will perform sit<>stand with MaxA +1 PT Short Term Goal 2 - Progress (Week 1): Progressing toward goal PT Short Term Goal 3 (Week 1): Pt will perform seat to seat transfers with MaxA +1 PT Short Term Goal 3 - Progress (Week 1): Progressing toward goal PT Short Term Goal 4 (Week 1): Pt will initiate sitting balance and maintain with MaxA+1. PT Short Term Goal 4 - Progress (Week 1): Progressing toward goal PT Short Term Goal 5 (Week 1): Pt will initiate gait training. PT Short Term Goal 5 - Progress (Week 1): Progressing toward goal Week 2:  PT Short Term Goal 1 (Week 2): Pt will perform all aspects of bed mobility with consistent MaxA. PT Short Term Goal 2 (Week 2): Pt will perform sit<>stand with MaxA +1 PT Short Term Goal 3 (Week 2): Pt will perform seat to seat transfers with MaxA +1 PT Short Term Goal 4 (Week 2): Pt will initiate sitting balance and maintain with MaxA+1. PT Short Term Goal 5 (Week 2): Pt will initiate gait training.  Skilled Therapeutic Interventions/Progress Updates: Patient supine in bed (HOB elevated) on entrance to room. Patient alert and agreeable to PT session.   Patient reported no pain, and that attending physician stated that rashes are likely coming from a medication (medication ceased).  Therapeutic Activity: Bed Mobility: Pt performed supine to R sidelying with totalA + 2, and supine to L sidelying with modA + 2 (cues to use L UE on HOB rail to pull self over, and L LE to bridge hips over) in order to donn hospital pants. Pt  supine to sit with maxA + 2, and sit to supine with max/totalA + 2 (EOB).  Neuromuscular Re-ed: NMR facilitated during session with focus on dynamic sitting with pt crossing midline away from pushing side. - pt sitting EOB with mirror in front and cues to correct heavy L lean by visually assessing self posture. Pt cued to to tap PTA's hand anteriorly to the R to bring self towards midline, pt showed greater initation this session, but still required max/heavy maxA to prevent from falling over to L onto rehab tech. After several minutes of having pt reaching to tap PTA hand with R UE, pt noted to require decreased assistance, and forward reaching (nose over toes) with heavy modA + 2.  - pt then cued to use 1lb weighted bar to reach and tap for green cone anteriorly to the R. Pt repetited task and eventually was able to cross midline, eventually only needing one finger assistance to prevent L lean (min/heavy minA) and crossing midline!! Pt noted to have more core engagement and feedback (does really well with cues to tap hands).   NMR performed for improvements in motor control and coordination, balance, sequencing, judgement, and self confidence/ efficacy in performing all aspects of mobility at highest level of independence.   Patient supine in bed at end of session with brakes locked, bed alarm set, wife present and all needs within reach.      Therapy Documentation Precautions:  Precautions  Precautions: Fall Precaution Comments: dense L hemi with inability to initiate motor control with R hemibody, easily distracted Restrictions Weight Bearing Restrictions: No   Therapy/Group: Individual Therapy  Kohl Polinsky PTA 11/26/2022, 12:14 PM

## 2022-11-27 NOTE — Progress Notes (Signed)
Speech Language Pathology Daily Session Note  Patient Details  Name: Samuel Pearson MRN: [id number] Date of Birth: December 19, 1945  Today's Date: 11/27/2022 SLP Individual Time: 7829-5621 SLP Individual Time Calculation (min): 56 min  Short Term Goals: Week 2: SLP Short Term Goal 1 (Week 2): Patient will demonstrate orientation to time with min multimodal cues SLP Short Term Goal 2 (Week 2): Patient will answer mildly complex problem solving questions with min multimodal A SLP Short Term Goal 3 (Week 2): Patient will utilize memory compensatory strategies with min multimodal A SLP Short Term Goal 4 (Week 2): Patient will demonstrate selective/sutained attention for >15 minutes with min multimodal A SLP Short Term Goal 5 (Week 2): Patient will initiate verbal responses in 75% of opportunities with min verbal cues SLP Short Term Goal 6 (Week 2): Patient will demonstrate intellectual awareness by verbalizing two physical and two cognitive impairments mod multimodal A  Skilled Therapeutic Interventions:   Pt seen for skilled SLP session to address cognitive goals. Pt participated in an informal sequencing task involving his daily routine at baseline. Pt required min cues to maintain attention to tasks given he is easily distracted by other internal thoughts. Pt completed visual safety awareness task with ~50% accuracy independently. He required moderate multi-modal cues to identify 100% of safety hazards and explain reasoning for hazard. Pt left sitting upright in bed with breakfast tray set up and call bell in reach. Bed alarm engaged. Continue SLP PoC.   Pain Pain Assessment Pain Scale: 0-10 Pain Score: 0-No pain  Therapy/Group: Individual Therapy  Ellery Plunk 11/27/2022, 10:49 AM

## 2022-11-27 NOTE — Progress Notes (Signed)
PROGRESS NOTE   Subjective/Complaints:  Patient upset today regarding recurrence of maculopapular rash, especially on his right arm and abdomen.  Discussed his history of atopic dermatitis, reviewed prior allergist notes, noticed symptoms had resolved prior with discontinuation of vitamin B12 in addition to other treatments; he was no  longer taking p.o. B12 at home.  He denies any known skin reactions to certain detergents, fabric softeners, foods.  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   ROS: + Rash-diffuse, recurrent Denies fevers, chills, N/V, abdominal pain, constipation, diarrhea, SOB, cough, chest pain, new weakness or paraesthesias.    Objective:   No results found. No results for input(s): "WBC", "HGB", "HCT", "PLT" in the last 72 hours.   No results for input(s): "NA", "K", "CL", "CO2", "GLUCOSE", "BUN", "CREATININE", "CALCIUM" in the last 72 hours.    Intake/Output Summary (Last 24 hours) at 11/27/2022 1626 Last data filed at 11/26/2022 1830 Gross per 24 hour  Intake 240 ml  Output --  Net 240 ml        Physical Exam: Vital Signs Blood pressure (!) 148/48, pulse 89, temperature 98.4 F (36.9 C), temperature source Oral, resp. rate 16, SpO2 91%.   General: No acute distress.  Semireclined in bed. Mood and affect are appropriate Heart: Regular rate and rhythm no rubs murmurs or extra sounds Lungs: Clear to auscultation, breathing unlabored, no rales or wheezes Abdomen: Positive bowel sounds, soft nontender to palpation, nondistended Extremities: No clubbing, cyanosis, or edema Skin: maculopapular rash especially along right antecubital folds, forearm, and over abdomen.   Skin: No evidence of breakdown, no evidence of rash Neurologic: continues with delayed responses but oriented to person , place and situation , can respond to basic questions with extra time.  + Right  hemineglect + Mild cognitive delay Antigravity and against resistance in the right upper and lower extremity and 0/5 left deltoid, bicep, tricep, grip, hip flexor, knee extensors, ankle dorsiflexor and plantar flexor but very deliberate. Sensory exam normal sensation to LT in bilateral upper and lower extremities Musculoskeletal: Full range of motion in all 4 extremities. No joint swelling   Assessment/Plan: 1. Functional deficits which require 3+ hours per day of interdisciplinary therapy in a comprehensive inpatient rehab setting. Physiatrist is providing close team supervision and 24 hour management of active medical problems listed below. Physiatrist and rehab team continue to assess barriers to discharge/monitor patient progress toward functional and medical goals  Care Tool:  Bathing    Body parts bathed by patient: Chest, Abdomen, Face   Body parts bathed by helper: Left arm, Right arm, Front perineal area, Buttocks, Left upper leg, Right upper leg, Right lower leg, Left lower leg     Bathing assist Assist Level: Maximal Assistance - Patient 24 - 49%     Upper Body Dressing/Undressing Upper body dressing   What is the patient wearing?: Pull over shirt    Upper body assist Assist Level: 2 Helpers    Lower Body Dressing/Undressing Lower body dressing      What is the patient wearing?: Incontinence brief, Pants     Lower body assist Assist for lower body dressing:  2 Helpers     Editor, commissioning assist Assist for toileting: Dependent - Patient 0%     Transfers Chair/bed transfer  Transfers assist  Chair/bed transfer activity did not occur: Safety/medical concerns  Chair/bed transfer assist level: Dependent - mechanical lift     Locomotion Ambulation   Ambulation assist   Ambulation activity did not occur: Safety/medical concerns          Walk 10 feet activity   Assist  Walk 10 feet activity did not occur: Safety/medical  concerns        Walk 50 feet activity   Assist Walk 50 feet with 2 turns activity did not occur: Safety/medical concerns         Walk 150 feet activity   Assist Walk 150 feet activity did not occur: Safety/medical concerns         Walk 10 feet on uneven surface  activity   Assist Walk 10 feet on uneven surfaces activity did not occur: Safety/medical concerns         Wheelchair     Assist Is the patient using a wheelchair?: Yes Type of Wheelchair: Manual Wheelchair activity did not occur: Safety/medical concerns         Wheelchair 50 feet with 2 turns activity    Assist    Wheelchair 50 feet with 2 turns activity did not occur: Safety/medical concerns       Wheelchair 150 feet activity     Assist  Wheelchair 150 feet activity did not occur: Safety/medical concerns       Blood pressure (!) 148/48, pulse 89, temperature 98.4 F (36.9 C), temperature source Oral, resp. rate 16, SpO2 91%.  Medical Problem List and Plan: 1. 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  Antithrombotics: -DVT/anticoagulation:  Pharmaceutical:             left tib and peroneal DVTs, started on Eliquis, pt asymptomatic ,Repeat doppler showing resolution of calf DVT cont Lovenox - no f/u doppler needed             -antiplatelet therapy: Aspirin and Plavix for three weeks followed by Plavix alone.  Last dose of aspirin will be 7/29 -Plavix held due to rash   3. Pain Management: Tylenol as needed   4.  Mood/Behavior/Sleep: LCSW to evaluate and provide emotional support             -antipsychotic agents: n/a Add low dose ritalin for initiation issues    5. Neuropsych/cognition: This patient is not capable of making decisions on his own behalf.             -pt has hx of ETOH abuse, 2-3 beers + wine most nights 6. 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  ? If plavix may be cause, also recently started on Eliquis  Use prn benadryl start stera pred pack -7-20: Much improved with current regimen, monitor -7-21: Rash has reoccurred with steroid taper.  Reviewing allergist documentation as outpatient 03/2022, notable history of periorbital rash that resolved after discontinuation of oral B12.  This was resumed in the hospital; DC'd today.  Suspected cobalt allergy at that time, however his testing was negative.   7. Fluids/Electrolytes/Nutrition: Routine Is and Os and follow-up chemistries Monday             -continue MVI, thiamine  -B12 17/2 was 204; discontinue p.o., can retest as outpatient   8: Hypertension: monitor TID and prn (home meds: lisinopril, Lopressor)             -continue amlodipine 5 mg daily Vitals:   11/27/22 0308 11/27/22 1320  BP: (!) 167/63 (!) 148/48  Pulse: 78 89  Resp: 16 16  Temp: 97.6 F (36.4 C) 98.4 F (36.9 C)  SpO2: 93% 91%  Sys BP elevated 7/19, will increase amlodipine to 10 mg daily; will take few days to kick in              9: Hyperlipidemia: continue statin   10: Prediabetes: ? diet controlled; A1c = 5.7%   11: Macrocytic anemia/B12 deficiency: follow-up CBC- Hgb normal /improved 7/15             -hx of pernicious anemia; PCP rec: daily B12    Latest Ref Rng & Units 11/21/2022    6:27 AM 11/15/2022    5:46 AM 11/13/2022    5:25 AM  CBC  WBC 4.0 - 10.5 K/uL 9.7  7.7  10.9   Hemoglobin 13.0 - 17.0 g/dL 16.1  09.6   04.5   Hematocrit 39.0 - 52.0 % 40.6  35.9  33.6   Platelets 150 - 400 K/uL 283  214  176     12. Ileus:Resolved 7/16             -moving bowels now. Last BM 7/15             -senna-s at bedtime             -PPI, prn simethicone 13. 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 Thyroid nodule-TFT's essentially WNL             -f/u as outpt, thyroid U/S, etc.     LOS: 13 days A FACE TO FACE EVALUATION WAS PERFORMED  Angelina Sheriff 11/27/2022, 4:26 PM

## 2022-11-27 NOTE — Plan of Care (Deleted)
Patient is able to request for assistance in ADL when needed

## 2022-11-28 DIAGNOSIS — F1010 Alcohol abuse, uncomplicated: Secondary | ICD-10-CM

## 2022-11-28 DIAGNOSIS — L270 Generalized skin eruption due to drugs and medicaments taken internally: Secondary | ICD-10-CM

## 2022-11-28 LAB — BASIC METABOLIC PANEL
Anion gap: 7 (ref 5–15)
BUN: 14 mg/dL (ref 8–23)
CO2: 23 mmol/L (ref 22–32)
Calcium: 8.5 mg/dL — ABNORMAL LOW (ref 8.9–10.3)
Chloride: 106 mmol/L (ref 98–111)
Creatinine, Ser: 0.61 mg/dL (ref 0.61–1.24)
GFR, Estimated: >60 mL/min (ref >60)
Glucose, Bld: 99 mg/dL (ref 70–99)
Potassium: 3.7 mmol/L (ref 3.5–5.1)
Sodium: 136 mmol/L (ref 135–145)

## 2022-11-28 LAB — CBC
HCT: 40 % (ref 39.0–52.0)
Hemoglobin: 13.4 g/dL (ref 13.0–17.0)
MCH: 32.8 pg (ref 26.0–34.0)
MCHC: 33.5 g/dL (ref 30.0–36.0)
MCV: 97.8 fL (ref 80.0–100.0)
Platelets: 272 10*3/uL (ref 150–400)
RBC: 4.09 MIL/uL — ABNORMAL LOW (ref 4.22–5.81)
RDW: 12.2 % (ref 11.5–15.5)
WBC: 8.1 10*3/uL (ref 4.0–10.5)
nRBC: 0 % (ref 0.0–0.2)

## 2022-11-28 MED ORDER — CLOPIDOGREL BISULFATE 75 MG PO TABS
75.0000 mg | ORAL_TABLET | Freq: Every day | ORAL | Status: DC
  Administered 2022-11-28 – 2022-12-09 (×12): 75 mg via ORAL
  Filled 2022-11-28 (×12): qty 1

## 2022-11-28 NOTE — Progress Notes (Signed)
Occupational Therapy Session Note  Patient Details  Name: Samuel Pearson MRN: [id number] Date of Birth: January 06, 1946  Today's Date: 11/28/2022 OT Individual Time: 4132-4401 OT Individual Time Calculation (min): 55 min    Short Term Goals: Week 2:  OT Short Term Goal 1 (Week 2): Pt will demonstrate improved L side awareness to wash left arm with min cues. OT Short Term Goal 2 (Week 2): Pt will demonstrate increased postural awareness to recognize with mod cues when he is leaning/falling back or to his L in unsupported sitting. OT Short Term Goal 3 (Week 2): pt will initiate proper placement of LUE during transfers for 3/3 consecutive sessions  Skilled Therapeutic Interventions/Progress Updates:  Pt greeted seated in TIS, pt agreeable to OT intervention. Pts wife, Aurea Graff present for session. Initiated education regarding DME for home. At this point, would recommend pt to DC home with hoyer lift, hospital bed, and custom w/c. Provided education and demo to wife on hoyer transfer for OOB mobility. Placed sling behind pt from w/c with pt needing assist to shift trunk anteriorly. Dependent transfer in hoyer lift back to bed with step by step directions provided to wife Aurea Graff.   Once in supine provided education on rolling to remove sling, pt doing better with bed mobility this session, seeming to do well with targets to reach for as well as increased time. Education provided to wife that at this time recommend bed level dressing toileting and bathing. Noted brief to be soiled and provided brief change with total A from bed level. Demonstrated to wife pts current level of assist needed to sit EOB to provide context into why this therapist would currently recommend ADLs from bed level vs EOB, wife agreeable to recommendation.   Ended session with pt seated in TIS with all needs within reach and safety belt activated.   Therapy Documentation Precautions:  Precautions Precautions: Fall Precaution  Comments: dense L hemi with inability to initiate motor control with R hemibody, easily distracted Restrictions Weight Bearing Restrictions: No    Pain: No pain reported during session    Therapy/Group: Individual Therapy  Pollyann Glen [facility] 11/28/2022, 2:09 PM

## 2022-11-28 NOTE — Progress Notes (Signed)
Speech Language Pathology Daily Session Note  Patient Details  Name: Samuel Pearson MRN: [id number] Date of Birth: 09-09-1945  Today's Date: 11/28/2022 SLP Individual Time: 1101-1201 SLP Individual Time Calculation (min): 60 min  Short Term Goals: Week 2: SLP Short Term Goal 1 (Week 2): Patient will demonstrate orientation to time with min multimodal cues SLP Short Term Goal 2 (Week 2): Patient will answer mildly complex problem solving questions with min multimodal A SLP Short Term Goal 3 (Week 2): Patient will utilize memory compensatory strategies with min multimodal A SLP Short Term Goal 4 (Week 2): Patient will demonstrate selective/sutained attention for >15 minutes with min multimodal A SLP Short Term Goal 5 (Week 2): Patient will initiate verbal responses in 75% of opportunities with min verbal cues SLP Short Term Goal 6 (Week 2): Patient will demonstrate intellectual awareness by verbalizing two physical and two cognitive impairments mod multimodal A  Skilled Therapeutic Interventions: Skilled therapy session focused on cognitive goals. SLP facilitated session by providing mod A to aid patient in orientation to time. Patient requiring increased cues this date to utilize calendar compared to prior. SLP addressed problem solving and initiation goals through functional use of a newspaper. Patient was asked to locate items in the newspaper and answer questions regarding the weather and olympic schedule. Patient able to answer problem solving questions with mod A, however initiated verbal  responses in 90% of opportunities given min cues. Patient continues to require ST intervention to address STG's. Patient left in [facility] with call bell in hand. Notified nurse regarding lack of chair alarm in room. Continue with POC.   Pain Pain Assessment Pain Scale: 0-10 Pain Score: 0-No pain  Therapy/Group: Individual Therapy  Leea Rambeau M.A., CF-SLP 11/28/2022, 12:23 PM

## 2022-11-28 NOTE — Progress Notes (Signed)
Physical Therapy Session Note  Patient Details  Name: Samuel Pearson MRN: [id number] Date of Birth: [birth year]  Today's Date: 11/28/2022 PT Individual Time: 1417-1510 PT Individual Time Calculation (min): 53 min   Short Term Goals: Week 1:  PT Short Term Goal 1 (Week 1): Pt will perform all aspects of bedmobility with consistent MaxA. PT Short Term Goal 1 - Progress (Week 1): Progressing toward goal PT Short Term Goal 2 (Week 1): Pt will perform sit<>stand with MaxA +1 PT Short Term Goal 2 - Progress (Week 1): Progressing toward goal PT Short Term Goal 3 (Week 1): Pt will perform seat to seat transfers with MaxA +1 PT Short Term Goal 3 - Progress (Week 1): Progressing toward goal PT Short Term Goal 4 (Week 1): Pt will initiate sitting balance and maintain with MaxA+1. PT Short Term Goal 4 - Progress (Week 1): Progressing toward goal PT Short Term Goal 5 (Week 1): Pt will initiate gait training. PT Short Term Goal 5 - Progress (Week 1): Progressing toward goal Week 2:  PT Short Term Goal 1 (Week 2): Pt will perform all aspects of bed mobility with consistent MaxA. PT Short Term Goal 2 (Week 2): Pt will perform sit<>stand with MaxA +1 PT Short Term Goal 3 (Week 2): Pt will perform seat to seat transfers with MaxA +1 PT Short Term Goal 4 (Week 2): Pt will initiate sitting balance and maintain with MaxA+1. PT Short Term Goal 5 (Week 2): Pt will initiate gait training.  Skilled Therapeutic Interventions/Progress Updates:  Patient seated upright in w/c on entrance to room. Patient alert and agreeable to PT session.   Patient with no pain complaint at start of session.  Discussion with pt and wife re: need for personal w/c prior to d/c for improved safe seating and mobility at home.   Therapeutic Activity/ NMR: Transfers: Pt performed sit<>stand transfers from w/c and with +2 support throughout session requiring multimodal cues and encouragement with 3 musketeer underarm support and Max/  TotA +2. Provided multimodal cues with mirror for visual feedback on technique and positioning.  Neuromuscular Re-ed/ Gait training: NMR facilitated during session with focus onstanding balance and upright posture. Pt guided in sit<>stand requiring verbal instructions prior to attempt, multimodal cues during attempts, and maxA +2 to initiate and perform.  MaxA +2 to maintain upright balance. Guided in pregait training including lateral weight shifts, progressing to addition of lifting unweighted limb from floor. Demonstrated flexed posture with no initiation for upright positioning for UB, extensive L lean, and only able to hold head up with vc - no shoulder repositioning to improve trunk flexion.  On next rise to stand, pt guided in ambulation of 6 steps using 3 musketeer support with MaxA +2 for balance, block to L knee, and facilitation with MaxA and multimodal cueing for improvement of upright posture including hip extension and trunk extension d/t pt inability to initiate.  Provided vc/ tc throughout for above and attempt for pt to relate feeling but unable during stance.  NMR performed for improvements in motor control and coordination, balance, sequencing, judgement, and self confidence/ efficacy in performing all aspects of mobility at highest level of independence.   Patient seated upright in TIS w/c at end of session with brakes locked, belt alarm set, LUE positioned on 1/2 lap tray and all needs within reach.  Therapy Documentation Precautions:  Precautions Precautions: Fall Precaution Comments: dense L hemi with inability to initiate motor control with R hemibody, easily distracted Restrictions Weight Bearing  Restrictions: No General:   Vital Signs:   Pain:    Therapy/Group: Individual Therapy  [name] PT, DPT, CSRS 11/28/2022, 5:24 PM

## 2022-11-28 NOTE — Progress Notes (Signed)
Physical Therapy Session Note  Patient Details  Name: Samuel Pearson MRN: [id number] Date of Birth: 1946-03-16  Today's Date: 11/28/2022 PT Individual Time: 1032-1101 PT Individual Time Calculation (min): 29 min   Short Term Goals: Week 1:  PT Short Term Goal 1 (Week 1): Pt will perform all aspects of bedmobility with consistent MaxA. PT Short Term Goal 1 - Progress (Week 1): Progressing toward goal PT Short Term Goal 2 (Week 1): Pt will perform sit<>stand with MaxA +1 PT Short Term Goal 2 - Progress (Week 1): Progressing toward goal PT Short Term Goal 3 (Week 1): Pt will perform seat to seat transfers with MaxA +1 PT Short Term Goal 3 - Progress (Week 1): Progressing toward goal PT Short Term Goal 4 (Week 1): Pt will initiate sitting balance and maintain with MaxA+1. PT Short Term Goal 4 - Progress (Week 1): Progressing toward goal PT Short Term Goal 5 (Week 1): Pt will initiate gait training. PT Short Term Goal 5 - Progress (Week 1): Progressing toward goal Week 2:  PT Short Term Goal 1 (Week 2): Pt will perform all aspects of bed mobility with consistent MaxA. PT Short Term Goal 2 (Week 2): Pt will perform sit<>stand with MaxA +1 PT Short Term Goal 3 (Week 2): Pt will perform seat to seat transfers with MaxA +1 PT Short Term Goal 4 (Week 2): Pt will initiate sitting balance and maintain with MaxA+1. PT Short Term Goal 5 (Week 2): Pt will initiate gait training.   Skilled Therapeutic Interventions/Progress Updates:  Patient supine in bed on entrance to room. Patient alert and agreeable to PT session.   Patient with no pain complaint at start of session.   Pt requests to don hospital scrub pants rather than personal shorts d/t relating feeling cold at times. 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   Therapeutic Activity: Pt assists with RLE threading into pant leg with MinA and LLE requires TotA. Is able to initiate bridging without clearance of bottom from bed surface with  vc.  Bed Mobility: Pt performed supine > sit with MaxA +2 and multimodal cues for sequencing bringing BLE off EOB, pushing up to R elbow, then progressing to R palmar push up to seated position. Demos push backward causing slide forward requiring block of knees to maintain seated position on bed. Cued to perform forward lean with no initiation.  Transfers: Pt performed sit<>stand in STEDY requiring Max/TotA +2 d/t strong push with RUE against pull bar. Requires several attempts to rise to stand despite push/ pull education prior, multimodal cues with facilitation for forward lean, and minimal initiation of RLE use. Requires several attempts to place perch seat pads behind pt. Once upright, pt transferred to TIS w/c and is able to demo controlled descent to sit with MinA.   Patient seated upright in TIS w/c with moderate tilt back at end of session with brakes locked, belt alarm set, and all needs within reach.   Therapy Documentation Precautions:  Precautions Precautions: Fall Precaution Comments: dense L hemi with inability to initiate motor control with R hemibody, easily distracted Restrictions Weight Bearing Restrictions: No General:   Vital Signs:   Pain:  No pain related this session.   Therapy/Group: Individual Therapy  [name] PT, DPT, CSRS 11/28/2022, 5:22 PM

## 2022-11-28 NOTE — Progress Notes (Signed)
PROGRESS NOTE   Subjective/Complaints:  Pt tells me that itching is less. Able to sleep last night. No new complaints today  ROS: Limited due to cognitive/behavioral      Objective:   No results found. Recent Labs    11/28/22 0742  WBC 8.1  HGB 13.4  HCT 40.0  PLT 272     Recent Labs    11/28/22 0742  NA 136  K 3.7  CL 106  CO2 23  GLUCOSE 99  BUN 14  CREATININE 0.61  CALCIUM 8.5*      Intake/Output Summary (Last 24 hours) at 11/28/2022 1320 Last data filed at 11/28/2022 0900 Gross per 24 hour  Intake 357 ml  Output 100 ml  Net 257 ml        Physical Exam: Vital Signs Blood pressure (!) 163/53, pulse 72, temperature 97.9 F (36.6 C), resp. rate 17, SpO2 97%.   Constitutional: No distress . Vital signs reviewed. HEENT: NCAT, EOMI, oral membranes moist Neck: supple Cardiovascular: RRR without murmur. No JVD    Respiratory/Chest: CTA Bilaterally without wheezes or rales. Normal effort    GI/Abdomen: BS +, non-tender, non-distended Ext: no clubbing, cyanosis, or edema Psych: pleasant and cooperative  Skin: maculopapular rash present but much improved along arms.    Skin: No evidence of breakdown, no evidence of rash Neurologic: continues with delayed responses but oriented to person , place and situation , can respond to basic questions with extra time and repetition---improved response time from we last met! + Right hemineglect   Antigravity and against resistance in the right upper and lower extremity and 0/5 left deltoid, bicep, tricep, grip, hip flexor, knee extensors, ankle dorsiflexor and plantar flexor but very deliberate. Sensory exam normal sensation to LT in bilateral upper and lower extremities Musculoskeletal: Full range of motion in all 4 extremities. No joint swelling   Assessment/Plan: 1. Functional deficits which require 3+ hours per day of interdisciplinary therapy in a  comprehensive inpatient rehab setting. Physiatrist is providing close team supervision and 24 hour management of active medical problems listed below. Physiatrist and rehab team continue to assess barriers to discharge/monitor patient progress toward functional and medical goals  Care Tool:  Bathing    Body parts bathed by patient: Chest, Abdomen, Face   Body parts bathed by helper: Left arm, Right arm, Front perineal area, Buttocks, Left upper leg, Right upper leg, Right lower leg, Left lower leg     Bathing assist Assist Level: Maximal Assistance - Patient 24 - 49%     Upper Body Dressing/Undressing Upper body dressing   What is the patient wearing?: Pull over shirt    Upper body assist Assist Level: 2 Helpers    Lower Body Dressing/Undressing Lower body dressing      What is the patient wearing?: Incontinence brief, Pants     Lower body assist Assist for lower body dressing: 2 Helpers     Toileting Toileting    Toileting assist Assist for toileting: Dependent - Patient 0%     Transfers Chair/bed transfer  Transfers assist  Chair/bed transfer activity did not occur: Safety/medical concerns  Chair/bed transfer assist level: Dependent - mechanical lift  Locomotion Ambulation   Ambulation assist   Ambulation activity did not occur: Safety/medical concerns          Walk 10 feet activity   Assist  Walk 10 feet activity did not occur: Safety/medical concerns        Walk 50 feet activity   Assist Walk 50 feet with 2 turns activity did not occur: Safety/medical concerns         Walk 150 feet activity   Assist Walk 150 feet activity did not occur: Safety/medical concerns         Walk 10 feet on uneven surface  activity   Assist Walk 10 feet on uneven surfaces activity did not occur: Safety/medical concerns         Wheelchair     Assist Is the patient using a wheelchair?: Yes Type of Wheelchair: Manual Wheelchair  activity did not occur: Safety/medical concerns         Wheelchair 50 feet with 2 turns activity    Assist    Wheelchair 50 feet with 2 turns activity did not occur: Safety/medical concerns       Wheelchair 150 feet activity     Assist  Wheelchair 150 feet activity did not occur: Safety/medical concerns       Blood pressure (!) 163/53, pulse 72, temperature 97.9 F (36.6 C), resp. rate 17, SpO2 97%.  Medical Problem List and Plan: 1. 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  Antithrombotics: -DVT/anticoagulation:  Pharmaceutical:             left tib and peroneal DVTs, started on Eliquis, pt asymptomatic ,Repeat doppler showing resolution of calf DVT cont Lovenox - no f/u doppler needed             -antiplatelet therapy: Aspirin and Plavix for three weeks followed by Plavix alone.  Last dose of aspirin will be 7/29   -7/22 will resume plavix today as it appears rash may have been due to oral B12. --   3. Pain Management: Tylenol as needed   4. Mood/Behavior/Sleep: LCSW to evaluate and provide emotional support             -antipsychotic agents: n/a Add low dose ritalin for initiation issues    5. Neuropsych/cognition: This patient is not capable of making decisions on his own behalf.             -pt has hx of ETOH abuse, 2-3 beers + wine most nights 6. Skin/Wound Care:maculopapular rash has cinnamon allergy but does not think he had any, looked at med list will hold ritalin and lipitor, ask pharmacy to check med list as well.  ? If plavix may be cause, also recently started on Eliquis  Use prn benadryl start stera pred pack -7-20: Much improved with  current regimen, monitor -7-21: Rash has reoccurred with steroid taper.  Reviewing allergist documentation as outpatient 03/2022, notable history of periorbital rash that resolved after discontinuation of oral B12.  This was resumed in the  hospital; DC'd today.  Suspected cobalt allergy at that time, however his testing was negative.  7/22 rash better today--obsv with resumption of plavix. Steroids still aboard.  7. Fluids/Electrolytes/Nutrition: Routine Is and Os and follow-up chemistries Monday             -continue MVI, thiamine  -B12 17/2 was 204; discontinue p.o., can retest as outpatient   8: Hypertension: monitor TID and prn (home meds: lisinopril, Lopressor)             -continue amlodipine 5 mg daily Vitals:   11/27/22 2018 11/28/22 0450  BP: (!) 142/42 (!) 163/53  Pulse: 73 72  Resp: 18 17  Temp: 98.4 F (36.9 C) 97.9 F (36.6 C)  SpO2: 95% 97%  Sys BP elevated 7/19, will increase amlodipine to 10 mg daily; will take few days to kick in              9: Hyperlipidemia: continue statin   10: Prediabetes: ? diet controlled; A1c = 5.7%   11: Macrocytic anemia/B12 deficiency: follow-up CBC- Hgb normal /improved 7/15             -hx of pernicious anemia; PCP rec: daily B12    Latest Ref Rng & Units 11/28/2022    7:42 AM 11/21/2022    6:27 AM 11/15/2022    5:46 AM  CBC  WBC 4.0 - 10.5 K/uL 8.1  9.7  7.7   Hemoglobin 13.0 - 17.0 g/dL 16.6  06.3  01.6   Hematocrit 39.0 - 52.0 % 40.0  40.6  35.9   Platelets 150 - 400 K/uL 272  283  214     12. Ileus:Resolved 7/16             -moving bowels now. Last BM 7/15             -senna-s at bedtime             -PPI, prn simethicone 13. 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 Thyroid nodule-TFT's essentially WNL             -f/u as outpt, thyroid U/S, etc.     LOS: 14 days A FACE TO FACE EVALUATION WAS PERFORMED  Ranelle Oyster 11/28/2022, 1:20 PM

## 2022-11-29 NOTE — Progress Notes (Addendum)
Speech Language Pathology Weekly Progress and Session Note  Patient Details  Name: Samuel Pearson MRN: [id number] Date of Birth: 09-20-1945  Beginning of progress report period: November 22, 2022 End of progress report period: November 29, 2022  Today's Date: 11/29/2022  Short Term Goals: Week 2: SLP Short Term Goal 1 (Week 2): Patient will demonstrate orientation to time with min multimodal cues SLP Short Term Goal 1 - Progress (Week 2): Not met SLP Short Term Goal 2 (Week 2): Patient will answer mildly complex problem solving questions with min multimodal A SLP Short Term Goal 2 - Progress (Week 2): Met SLP Short Term Goal 3 (Week 2): Patient will utilize memory compensatory strategies with min multimodal A SLP Short Term Goal 3 - Progress (Week 2): Met SLP Short Term Goal 4 (Week 2): Patient will demonstrate selective/sutained attention for >15 minutes with min multimodal A SLP Short Term Goal 4 - Progress (Week 2): Met SLP Short Term Goal 5 (Week 2): Patient will initiate verbal responses in 75% of opportunities with min verbal cues SLP Short Term Goal 5 - Progress (Week 2): Met SLP Short Term Goal 6 (Week 2): Patient will demonstrate intellectual awareness by verbalizing two physical and two cognitive impairments mod multimodal A SLP Short Term Goal 6 - Progress (Week 2): Met    New Short Term Goals: Week 3: SLP Short Term Goal 1 (Week 3): Patient will demonstrate orientation to time with min multimodal cues SLP Short Term Goal 2 (Week 3): Patient will answer mildly complex problem solving questions with supervision multimodal A SLP Short Term Goal 3 (Week 3): Patient will utilize memory compensatory strategies with supervision multimodal A SLP Short Term Goal 4 (Week 3): Patient will demonstrate selective/sutained attention for >20 minutes with min multimodal A SLP Short Term Goal 5 (Week 3): Patient will initiate verbal responses in 75% of opportunities with supervision verbal cues SLP  Short Term Goal 6 (Week 3): Patient will demonstrate intellectual awareness by verbalizing two physical and two cognitive impairments min multimodal A  Weekly Progress Updates: Pt has made good gains and has met 5/6 STG's this reporting period due to increased attention, awareness, initiation, problem solving and memory. Currently, patient continues to require min A to sustain attention for >15 minutes at a time. Patient continues to benefit from verbal cues to remain on task and utilize visual scanning to the L field. Patient continues to require mod A to demonstrate intellectual awareness. Patient able to recall need to scan to L and recognizes changes in processing time. Initiation has improved this reporting period as patient provides verbal responses in >75% of opportunities with min A. During problem solving and memory tasks, patient continues to benefit from min A to recall and utilize strategies. Patient did not meet orientation goal this week as he continues to require min A to utilize calendar and recall functional cues (on Wednesday his lawn gets mowed) to recall day of the week. Pt/family education ongoing. Pt would benefit from continued skilled ST intervention to maximize cognition, attention and awareness in order to maximize functional independence for d/c.   Intensity: Minumum of 1-2 x/day, 30 to 90 minutes Frequency: 1 to 3 out of 7 days Duration/Length of Stay: 12/13/22 Treatment/Interventions: Cognitive remediation/compensation;Internal/external aids;Speech/Language facilitation;Cueing hierarchy;Environmental controls;Therapeutic Activities;Functional tasks;Patient/family education;Therapeutic Exercise  Therapy/Group: Individual Therapy  Josilynn Losh M.A., CF-SLP 11/29/2022, 12:00 PM

## 2022-11-29 NOTE — Progress Notes (Signed)
PROGRESS NOTE   Subjective/Complaints:  Pt in gym with PT. Walked 12 steps today. Feels pretty well. Rash and itching better.    ROS: Limited due to cognitive/behavioral      Objective:   No results found. Recent Labs    11/28/22 0742  WBC 8.1  HGB 13.4  HCT 40.0  PLT 272     Recent Labs    11/28/22 0742  NA 136  K 3.7  CL 106  CO2 23  GLUCOSE 99  BUN 14  CREATININE 0.61  CALCIUM 8.5*      Intake/Output Summary (Last 24 hours) at 11/29/2022 0914 Last data filed at 11/29/2022 6295 Gross per 24 hour  Intake 720 ml  Output --  Net 720 ml        Physical Exam: Vital Signs Blood pressure (!) 158/50, pulse 73, temperature 97.7 F (36.5 C), resp. rate 18, height 5\' 6"  (1.676 m), SpO2 97%.   Constitutional: No distress . Vital signs reviewed. HEENT: NCAT, EOMI, oral membranes moist Neck: supple Cardiovascular: RRR without murmur. No JVD    Respiratory/Chest: CTA Bilaterally without wheezes or rales. Normal effort    GI/Abdomen: BS +, non-tender, non-distended Ext: no clubbing, cyanosis, or edema Psych: pleasant and cooperative  Skin: reduced maculopapular rash on arms and legs Neurologic: very bright, more engaging. continues with delayed responses but oriented to person , place and situation , can respond to basic questions with extra time and repetition---improved response time from we last met! + Right hemineglect   Antigravity and against resistance in the right upper and lower extremity and 0/5 left deltoid, bicep, tricep, grip, hip flexor, knee extensors, ankle dorsiflexor and plantar flexor but very deliberate. Sensory exam normal sensation to LT in bilateral upper and lower extremities Musculoskeletal: Full range of motion in all 4 extremities. No joint swelling   Assessment/Plan: 1. Functional deficits which require 3+ hours per day of interdisciplinary therapy in a comprehensive  inpatient rehab setting. Physiatrist is providing close team supervision and 24 hour management of active medical problems listed below. Physiatrist and rehab team continue to assess barriers to discharge/monitor patient progress toward functional and medical goals  Care Tool:  Bathing    Body parts bathed by patient: Chest, Abdomen, Face   Body parts bathed by helper: Left arm, Right arm, Front perineal area, Buttocks, Left upper leg, Right upper leg, Right lower leg, Left lower leg     Bathing assist Assist Level: Maximal Assistance - Patient 24 - 49%     Upper Body Dressing/Undressing Upper body dressing   What is the patient wearing?: Pull over shirt    Upper body assist Assist Level: 2 Helpers    Lower Body Dressing/Undressing Lower body dressing      What is the patient wearing?: Incontinence brief, Pants     Lower body assist Assist for lower body dressing: 2 Helpers     Toileting Toileting    Toileting assist Assist for toileting: Dependent - Patient 0%     Transfers Chair/bed transfer  Transfers assist  Chair/bed transfer activity did not occur: Safety/medical concerns  Chair/bed transfer assist level: Dependent - mechanical lift  Locomotion Ambulation   Ambulation assist   Ambulation activity did not occur: Safety/medical concerns          Walk 10 feet activity   Assist  Walk 10 feet activity did not occur: Safety/medical concerns        Walk 50 feet activity   Assist Walk 50 feet with 2 turns activity did not occur: Safety/medical concerns         Walk 150 feet activity   Assist Walk 150 feet activity did not occur: Safety/medical concerns         Walk 10 feet on uneven surface  activity   Assist Walk 10 feet on uneven surfaces activity did not occur: Safety/medical concerns         Wheelchair     Assist Is the patient using a wheelchair?: Yes Type of Wheelchair: Manual Wheelchair activity did not  occur: Safety/medical concerns         Wheelchair 50 feet with 2 turns activity    Assist    Wheelchair 50 feet with 2 turns activity did not occur: Safety/medical concerns       Wheelchair 150 feet activity     Assist  Wheelchair 150 feet activity did not occur: Safety/medical concerns       Blood pressure (!) 158/50, pulse 73, temperature 97.7 F (36.5 C), resp. rate 18, height 5\' 6"  (1.676 m), SpO2 97%.  Medical Problem List and Plan: 1. 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  Antithrombotics: -DVT/anticoagulation:  Pharmaceutical:             left tib and peroneal DVTs, started on Eliquis, pt asymptomatic ,Repeat doppler showing resolution of calf DVT cont Lovenox - no f/u doppler needed             -antiplatelet therapy: Aspirin and Plavix for three weeks followed by Plavix alone.  Last dose of aspirin will be 7/29   -7/23 resumed plavix yesterday and no new rash-   3. Pain Management: Tylenol as needed   4. Mood/Behavior/Sleep: LCSW to evaluate and provide emotional support             -antipsychotic agents: n/a Add low dose ritalin for initiation issues    5. Neuropsych/cognition: This patient is not capable of making decisions on his own behalf.             -pt has hx of ETOH abuse, 2-3 beers + wine most nights 6. Skin/Wound Care -maculopapular rash--likely d/t b12 which was stopped -rash much improved -steroid taper    7. Fluids/Electrolytes/Nutrition: Routine Is and Os and follow-up chemistries Monday             -continue multivit, thiamine. He's eating well  -B12 17/2 was 204; discontinue p.o., can retest as outpatient   8:  Hypertension: monitor TID and prn (home meds: lisinopril, Lopressor)             -continue amlodipine 5 mg daily Vitals:   11/28/22 2146 11/29/22 0541  BP: (!) 136/55 (!) 158/50  Pulse: 72 73  Resp: 17 18  Temp: 97.6 F (36.4 C) 97.7 F (36.5 C)  SpO2: 95% 97%  Sys BP elevated 7/19, will increase amlodipine to 10 mg daily; --fair control 7/23              9: Hyperlipidemia: continue statin   10: Prediabetes: ? diet controlled; A1c = 5.7%   11: Macrocytic anemia/B12 deficiency: follow-up CBC- Hgb normal /improved 7/15             -hx of pernicious anemia; PCP rec: daily B12    Latest Ref Rng & Units 11/28/2022    7:42 AM 11/21/2022    6:27 AM 11/15/2022    5:46 AM  CBC  WBC 4.0 - 10.5 K/uL 8.1  9.7  7.7   Hemoglobin 13.0 - 17.0 g/dL 86.5  78.4  69.6   Hematocrit 39.0 - 52.0 % 40.0  40.6  35.9   Platelets 150 - 400 K/uL 272  283  214     12. Ileus: Resolved 7/16             -moving bowels now. Last BM 7/15             -senna-s at bedtime             -PPI, prn simethicone 13. 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 Thyroid nodule-TFT's essentially WNL             -f/u as outpt, thyroid U/S, etc.     LOS: 15 days A FACE TO FACE EVALUATION WAS PERFORMED  Ranelle Oyster 11/29/2022, 9:14 AM

## 2022-11-29 NOTE — Progress Notes (Signed)
Patient ID: Samuel Pearson, male   DOB: [birth year], 77 y.o.   MRN: [id number]  SW and daughter will be present for team conference updates tomorrow. Spouse inquiring about an extension. SW will discuss progress and pt potential of extension.

## 2022-11-29 NOTE — Progress Notes (Addendum)
Physical Therapy Session Note  Patient Details  Name: Samuel Pearson MRN: [id number] Date of Birth: 1945-05-22  Today's Date: 11/29/2022 PT Individual Time: 0800-0900 PT Individual Time Calculation (min): 60 min   Short Term Goals: Week 2:  PT Short Term Goal 1 (Week 2): Pt will perform all aspects of bed mobility with consistent MaxA. PT Short Term Goal 2 (Week 2): Pt will perform sit<>stand with MaxA +1 PT Short Term Goal 3 (Week 2): Pt will perform seat to seat transfers with MaxA +1 PT Short Term Goal 4 (Week 2): Pt will initiate sitting balance and maintain with MaxA+1. PT Short Term Goal 5 (Week 2): Pt will initiate gait training.  Skilled Therapeutic Interventions/Progress Updates: Patient supine in bed with HOB elevated on entrance to room. Patient alert and agreeable to PT session.   Patient reported no pain at beginning of session, and that the rash has gotten a lot better after physicians adjusted medication. Pt noted to have decreased push towards L side this session in comparison to previous sessions.  Therapeutic Activity: Bed Mobility: Pt brief was soiled. Personal care performed in bed + 2. Pt supine to L sidelying with modA (multimodal cues to use R LE to bridge R hip over to L side). Pt supine to R sidelying with max/totalA. Pt totalA to doff/donn brief, and maxA to donn personal shorts with cues to perform bridge with L LE to donn R side of shorts up to waist. Pt supine to sit with maxA, and VC for pt to use R UE to push into bed to assist in truncal elevation. Pt with heavy push posteriorly to the L (not as heavy as previous week).  Transfers: Pt cued to weight shift anteriorly with maxA and cues for pt to bring nose over R knee to correct posture towards midline and anterior lean. Pt performed sit to stand EOB to STEDY with maxA + 2 (pt cued to count to 3 and to initiate stand with use of pulling L UE on STEDY bar - pt also required increased cues to pull instead of  pushing on bar), and transported to TIS with PT on L side of pt providing maxA to prevent pt from falling to L. PT maxA to control descent to TIS.  Pt required max/totalA to stand from TIS with 3 musketeer method (pt cued to anteriorly weight shift by cues for pt to reach with R UE to tap therapist hand anteriorly to the L).   Gait Training:  Pt ambulated 12' using 3 musketeer method with PT on R side and PTA on L side. PTA facilitated R LE advancement (totalA) and totalA for R knee block. PT Assisted with L LE advancement that required mod/maxA (pt required increased time/effort to initiate swing phase and stance phase - when pt initiates swing on R, pt would step with multiple little steps and required mod/max to advance to longer step pattern). Pt also required total/heavy maxA to maintain upright posture throughout.   Patient in TIS at end of session with brakes locked, belt alarm set, all needs within reach, and handed off to SLP.       Therapy Documentation Precautions:  Precautions Precautions: Fall Precaution Comments: dense L hemi with inability to initiate motor control with R hemibody, easily distracted Restrictions Weight Bearing Restrictions: No  Therapy/Group: Individual Therapy  Natasja Niday PTA 11/29/2022, 1:11 PM

## 2022-11-29 NOTE — Progress Notes (Signed)
Occupational Therapy Session Note  Patient Details  Name: Samuel Pearson MRN: [id number] Date of Birth: 18-Feb-1946  Today's Date: 11/29/2022 OT Individual Time: 1121-1205 session 1  OT Individual Time Calculation (min): 44 min  Session 2: 4098-1191   Short Term Goals: Week 2:  OT Short Term Goal 1 (Week 2): Pt will demonstrate improved L side awareness to wash left arm with min cues. OT Short Term Goal 2 (Week 2): Pt will demonstrate increased postural awareness to recognize with mod cues when he is leaning/falling back or to his L in unsupported sitting. OT Short Term Goal 3 (Week 2): pt will initiate proper placement of LUE during transfers for 3/3 consecutive sessions  Skilled Therapeutic Interventions/Progress Updates:  Session 1: Pt greeted supine in bed reporting fatigue from earlier PT session, pt agreeable to OT intervention.   1:1 NMES applied to L bicep at the below settings:    Ratio 1:3 Rate 35 pps Waveform- Asymmetric Ramp 1.0 Pulse 300 Intensity- 12  Duration -   13 mins  Worked on hand to mouth pattern for self feeding and ADLs. No adverse reactions or redness noted after intervention. Once electrodes were doffed, pt noted to have trace activation at L bicep.  Donned pants from supine with MAXA with pt able to bridge with LLE stabilized and pt assisting with pulling pants to waist line from bridge position. Pt completed supine>sit with MAX A needed to elevate trunk into sitting, utilized visual targets when transitioning into sitting with pt continuing to be slow to initiate movement but pt is initiating volitional movement during bed mobility.   Utilized stedy for OOB transfer with an emphasis on midline orientation with visual cue of reaching RUE to rail of stedy. Pt required MAX A +2 to rise into standing from elevated EOB. MAX A to keep trunk supported while sitting on perch of stedy. Dependent transfer into w/c.  Ended session with pt seated in TIS with alarm  belt activated and all needs within reach.                    Session 2: Pt greeted seated in w/c, pt agreeable to OT intervention. Pt dependently transferred in  w/c to gym where pt completed squat pivot to EOM to R side with visual targets utilized during transfer to facilitate improved anterior weight shift during transfer. Pt needed hand over hand cues to keep R hand positioned on target d/t impaired attention to task. Pt overall required total A +2 to transfer to mat table.   Once EOM, transferred pt to bench via anterior scoots to have pt straddle low bench to facilitate anterior pelvic tilt and eliminate posterior lean in sitting. Placed R hand on visual target positioned in front of pt and to his R side to facilitate improved anterior lean and decrease pushing to L side. Pt instructed to reach out of BOS to R side to retreive squigz from mirror to further facilitate midline orientation and decrease pushing to L side. Overall pt continued to require up to MAX A for static sitting balance and to maintain midline posture. Pt was able to achieve the best anterior lean this therapist has noted via this treatment intervention.                   Ended session with pt seated in TIS with all needs within reach and safety belt alarm activated.  Therapy Documentation Precautions:  Precautions Precautions: Fall Precaution Comments: dense L hemi with inability to initiate motor control with R hemibody, easily distracted Restrictions Weight Bearing Restrictions: No    Pain: No pain indicated during either session    Therapy/Group: Individual Therapy  Samuel Pearson 11/29/2022, 12:35 PM

## 2022-11-29 NOTE — Progress Notes (Signed)
Speech Language Pathology Daily Session Note  Patient Details  Name: Samuel Pearson MRN: [id number] Date of Birth: 1945/07/30  Today's Date: 11/29/2022 SLP Individual Time: 0901-0959 SLP Individual Time Calculation (min): 58 min  Short Term Goals: Week 2: SLP Short Term Goal 1 (Week 2): Patient will demonstrate orientation to time with min multimodal cues SLP Short Term Goal 2 (Week 2): Patient will answer mildly complex problem solving questions with min multimodal A SLP Short Term Goal 3 (Week 2): Patient will utilize memory compensatory strategies with min multimodal A SLP Short Term Goal 4 (Week 2): Patient will demonstrate selective/sutained attention for >15 minutes with min multimodal A SLP Short Term Goal 5 (Week 2): Patient will initiate verbal responses in 75% of opportunities with min verbal cues SLP Short Term Goal 6 (Week 2): Patient will demonstrate intellectual awareness by verbalizing two physical and two cognitive impairments mod multimodal A  Skilled Therapeutic Interventions: SLP conducted skilled therapy session targeting cognitive retraining and communication facilitation. Patient was oriented to month and year independently but required modA and utilization of room calendar to orient to date and day of the week. SLP guided patient through scheduling/scanning task by scanning olympic event schedule for patient's most anticipated sports. Patient required mod cues to scan to the left side of the page and benefited from repetition of prompt and sequencing of steps by SLP to find target dates. Patient also required mod to maxA to determine how many days away each event will be. SLP introduced medication management to further target scanning, attention, and problem solving. Patient required modA to identify medication organization errors. Throughout session, patient initiated responses in >75% of opportunities given minA and responded to approximately 50% prior to cue  administration. Patient exhibits improved awareness, stating 2 cognitive impairments and 2 physical impairments that are targeted by therapies during inpatient stay. Patient was left in chair with call bell in reach and chair alarm set. SLP will continue to target goals per plan of care.      Pain Pain Assessment Pain Scale: 0-10 Pain Score: 0-No pain  Therapy/Group: Individual Therapy  Jeannie Done, M.A., CCC-SLP  Yetta Barre 11/29/2022, 11:59 AM

## 2022-11-30 MED ORDER — SIMETHICONE 80 MG PO CHEW: 80.0000 mg | CHEWABLE_TABLET | Freq: Four times a day (QID) | ORAL | Status: DC | PRN

## 2022-11-30 NOTE — Progress Notes (Signed)
Occupational Therapy Weekly Progress Note  Patient Details  Name: KORBAN SHEARER MRN: [id number] Date of Birth: 06-Apr-1946  Beginning of progress report period: November 23, 2022 End of progress report period: November 30, 2022  Today's Date: 11/30/2022 OT Individual Time: 5573-2202 OT Individual Time Calculation (min): 45 min    Patient has met 3 of 3 short term goals.  Pt is making incremental progress with his Left side awareness, postural awareness (when he is leaning left) but not able to correct it., and somewhat improved response time.  He has been enjoying seeing his L hand move with the estim.  He continues to need heavy total A of 2 people with mobility and self care, although he has been able to self feed and help with sponge bathing in bed.   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  Patient not progressing toward long term goals.  See goal revision..  Plan of care revisions: .Marland Kitchen  Problem: RH Balance Goal: LTG: Patient will maintain dynamic sitting balance (OT) Description: LTG:  Patient will maintain dynamic sitting balance with assistance during activities of daily living (OT) Flowsheets (Taken 11/30/2022 1305) LTG: Pt will maintain dynamic sitting balance during ADLs with: (LTG downgraded due to slow progress.) Moderate Assistance - Patient 50 - 74% Note: LTG downgraded due to slow progress.  Goal: LTG Patient will maintain dynamic standing with ADLs (OT) Description: LTG:  Patient will maintain dynamic standing balance with assist during  activities of daily living (OT)  Flowsheets (Taken 11/30/2022 1305) LTG: Pt will maintain dynamic standing balance during ADLs with: (LTG downgraded due to slow progress.) Maximal Assistance - Patient 25 - 49% Note: LTG downgraded due to slow progress.    Problem: Sit to Stand Goal: LTG:  Patient will perform sit to stand in prep for activites of daily living with assistance level (OT) Description: LTG:  Patient will perform sit to stand in prep for activites of daily living with assistance level (OT) Flowsheets (Taken 11/30/2022 1305) LTG: PT will perform sit to stand in prep for activites of daily living with assistance level: (LTG downgraded due to slow progress.) Moderate Assistance - Patient 50 - 74% Note: LTG downgraded due to slow progress.    Problem: RH Bathing Goal: LTG Patient will bathe all body parts with assist levels (OT) Description: LTG: Patient will bathe all body parts with assist levels (OT) Flowsheets (Taken 11/30/2022 1305) LTG: Pt will perform bathing with assistance level/cueing: (LTG downgraded due to slow progress.) Moderate Assistance - Patient 50 - 74% Note: LTG downgraded due to slow progress.    Problem: RH Dressing Goal: LTG Patient will perform lower body dressing w/assist (OT) Description: LTG: Patient will perform lower body dressing with assist, with/without cues in positioning using equipment (OT) Flowsheets (Taken 11/30/2022 1305) LTG: Pt will perform lower body dressing with assistance level of: (LTG downgraded due to slow progress.) Maximal Assistance - Patient 25 - 49% Note: LTG downgraded due to slow progress.    Problem: RH Toileting Goal: LTG Patient will perform toileting task (3/3 steps) with assistance level (OT) Description: LTG: Patient will perform toileting task (3/3 steps) with assistance level (OT)  Flowsheets (  Taken 11/30/2022 1305) LTG: Pt will perform toileting task (3/3 steps) with assistance level: (LTG discontinued at this time as pt  is not safe to sit on a BSC or a toilet.) -- Note: LTG discontinued at this time as pt is not safe to sit on a BSC or a toilet.    Problem: RH Toilet Transfers Goal: LTG Patient will perform toilet transfers w/assist (OT) Description: LTG: Patient will perform toilet transfers with assist, with/without cues using equipment (OT) Flowsheets (Taken 11/30/2022 1305) LTG: Pt will perform toilet transfers with assistance level of: (LTG discontinued at this time as pt is not safe to sit on a BSC or a toilet.) -- Note: LTG discontinued at this time as pt is not safe to sit on a BSC or a toilet.    Problem: RH Tub/Shower Transfers Goal: LTG Patient will perform tub/shower transfers w/assist (OT) Description: LTG: Patient will perform tub/shower transfers with assist, with/without cues using equipment (OT) Flowsheets (Taken 11/30/2022 1305) LTG: Pt will perform tub/shower stall transfers with assistance level of: (LTG discontinued at this time as pt is not safe to sit on shower bench.) -- Note: LTG discontinued at this time as pt is not safe to sit on shower bench.   OT Short Term Goals Week 1:  OT Short Term Goal 1 (Week 1): Pt will be able to maintain static sitting EOB with mod A or less A to enable him to safely sit on a BSC. OT Short Term Goal 1 - Progress (Week 1): Progressing toward goal OT Short Term Goal 2 (Week 1): Pt will demonstrate increased postural awareness to recognize with mod cues when he is leaning/falling back or to his L in unsupported sitting. OT Short Term Goal 2 - Progress (Week 1): Progressing toward goal OT Short Term Goal 3 (Week 1): Pt will demonstrate improved L side awareness to wash left arm with min cues. OT Short Term Goal 3 - Progress (Week 1): Progressing toward goal OT Short Term Goal 4 (Week 1): Pt will be able to self feed with min A. OT Short Term Goal 4 - Progress (Week 1): Met Week 2:  OT Short Term Goal 1 (Week 2): Pt will demonstrate improved L side awareness  to wash left arm with min cues. OT Short Term Goal 1 - Progress (Week 2): Met OT Short Term Goal 2 (Week 2): Pt will demonstrate increased postural awareness to recognize with mod cues when he is leaning/falling back or to his L in unsupported sitting. OT Short Term Goal 2 - Progress (Week 2): Met OT Short Term Goal 3 (Week 2): pt will initiate proper placement of LUE during transfers for 3/3 consecutive sessions OT Short Term Goal 3 - Progress (Week 2): Met Week 3:  OT Short Term Goal 1 (Week 3): Pt will be able use his R arm to correct his postition to midline with CGA and min cues when he is leaning to the left in the bed. OT Short Term Goal 2 (Week 3): Pt will demonstrate improved ability to follow directions to don shirt with mod A and mod cues. OT Short Term Goal 3 (Week 3): Pt will maintain static sitting with max A of 1.  Skilled Therapeutic Interventions/Progress Updates:    Pt received in bed ready for therapy.  Focus of therapy session on having pt engage in self bathing and dressing and then work on his postural control.      ADL Retraining: -bathing and dressing bed level with  pt demonstrating improved attention and awareness of LUE, self care needs to be performed bed level as sitting EOB is not safe at this time with multitasking skills.  His attention span is short with functional activities, so even during UB dressing he needs constant cuing to attend to task. +2 A with self care to help with rolling pt in bed and adjusting him   Neuromuscular Re-Education:  -pt positioned in chair position in bed to work on upright positioning.  Placed a wedge behind his back for more upright posture and to have pt work on reaching forward.  Unable to follow most cues but when cued to reach to his R big toe, he did actively come forward and was able to do so.  He does better with close chain activities than open.  Used a large foam pad as a target and pt had to use R hand to push pad away with  therapist holding pad.  He needed multiple trials to fully grasp activity but was eventually able to follow instructions.   Pt pushed pad to R side to work on symmetrical posture. Pt would frequently "fall" to his L in bed leaning on L bed rail.  Tried multiple cues to have pt self correct with R arm on rail to pull himself over but pt not able to motor plan and needed total A to find center. He points his finger up to state he is not in center but not able to move his torso using the R side of his body.   -positioned pt in midline with wedge on L side of body and 2 pillows supporting L arm.   Pt resting in bed with all needs met. Alarm set and call light in reach.    Therapy Documentation Precautions:  Precautions Precautions: Fall Precaution Comments: dense L hemi with inability to initiate motor control with R hemibody, easily distracted Restrictions Weight Bearing Restrictions: No      Pain: Pain Assessment Pain Scale: 0-10 Pain Score: 0-No pain ADL: ADL Eating: Moderate assistance Grooming: Moderate assistance Upper Body Bathing: Moderate assistance Lower Body Bathing: Maximal assistance Upper Body Dressing: Maximal assistance Where Assessed-Upper Body Dressing: Bed level Lower Body Dressing: Dependent Where Assessed-Lower Body Dressing: Bed level Toileting: Unable to assess Toilet Transfer: Unable to assess   Therapy/Group: Individual Therapy  Mako Pelfrey 11/30/2022, 10:14 AM

## 2022-11-30 NOTE — Progress Notes (Signed)
Patient ID: Samuel Pearson, male   DOB: Jul 10, 1945, 77 y.o.   MRN: [id number]  Team Conference Report to Patient/Family  Team Conference discussion was reviewed with the patient and caregiver, including goals, any changes in plan of care and target discharge date.  Patient and caregiver express understanding and are in agreement.  The patient has a target discharge date of 12/13/22.  SW met with patient, spouse and daughter and provided team conference updated. Daughter and spouse aware that patient will require significant assistance at d/c. SW discussed pt's LOC  and [facility] VS SNF options with family. Spouse and daughter in discussion of what option to take. Spouse does not feel comfortable having someone in the home often to assist. Daughter leaning toward SNF option due to the significant assist required.  Sw will allow pt, spouse and daughter to discuss preferred option and SW will follow up tomorrow to determine.  Andria Rhein 11/30/2022, 1:47 PM

## 2022-11-30 NOTE — Progress Notes (Addendum)
Speech Language Pathology Daily Session Note  Patient Details  Name: Samuel Pearson MRN: [id number] Date of Birth: January 11, 1946  Today's Date: 11/30/2022 SLP Individual Time: 1330-1415 SLP Individual Time Calculation (min): 45 min  Short Term Goals: Week 3: SLP Short Term Goal 1 (Week 3): Patient will demonstrate orientation to time with min multimodal cues SLP Short Term Goal 2 (Week 3): Patient will answer mildly complex problem solving questions with supervision multimodal A SLP Short Term Goal 3 (Week 3): Patient will utilize memory compensatory strategies with supervision multimodal A SLP Short Term Goal 4 (Week 3): Patient will demonstrate selective/sutained attention for >20 minutes with min multimodal A SLP Short Term Goal 5 (Week 3): Patient will initiate verbal responses in 75% of opportunities with supervision verbal cues SLP Short Term Goal 6 (Week 3): Patient will demonstrate intellectual awareness by verbalizing two physical and two cognitive impairments min multimodal A  Skilled Therapeutic Interventions: Skilled therapy session focused on initiation, attention and problem solving goals. SLP facilitated session by educating patient on L>R scanning strategies to aid in L neglect. Patient completed connect the dots activity to utilize L>R scanning strategy with mod I assist, though needed reinforcement to continue to attend to the task. Patient often distracted by TV/talking to SLP, therefore SLP instructed patient to turn off the TV and continue the task before conversing. Patient with significant improvement in verbal initiation this date with verbal responses occurring in 100% of opportunities and mod I A. To address problem solving goals, SLP provided supervision A to for patient to turn TV to the channel the Olympic's will be on starting Friday. Patient was able to problem solve with supervision A how to change the channels and utilize remote. Overall, patient continues to require  at least min cues to attend to tasks for >20 minutes, however has demonstrated functional progress towards initiation goals. Patient left in [facility] with alarm set and call bell in reach. Continue POC.   Pain Denies Pain  Therapy/Group: Individual Therapy  Karilynn Carranza M.A., CF-SLP 11/30/2022, 3:13 PM

## 2022-11-30 NOTE — Progress Notes (Signed)
Physical Therapy Weekly Progress Note  Patient Details  Name: Samuel Pearson MRN: [id number] Date of Birth: February 28, 1946  Beginning of progress report period: November 23, 2022 End of progress report period: November 30, 2022  {CHL IP REHAB PT TIME CALCULATION:304800500}  Patient has met {number 1-5:22450} of {number 1-5:20334} short term goals.  ***  Patient continues to demonstrate the following deficits {impairments:3041632} and therefore will continue to benefit from skilled PT intervention to increase functional independence with mobility.  Patient {LTG progression:3041653}.  {plan of QMVH:8469629}  PT Short Term Goals {BMW:4132440}  Skilled Therapeutic Interventions/Progress Updates:  Ambulation/gait training;Discharge planning;Functional mobility training;Psychosocial support;Therapeutic Activities;Visual/perceptual remediation/compensation;Balance/vestibular training;Disease management/prevention;Neuromuscular re-education;Skin care/wound management;Wheelchair propulsion/positioning;UE/LE Strength taining/ROM;Splinting/orthotics;DME/adaptive equipment instruction;Cognitive remediation/compensation;Community reintegration;Patient/family education;Stair training;UE/LE Coordination activities;Functional electrical stimulation;Therapeutic Exercise   Therapy Documentation Precautions:  Precautions Precautions: Fall Precaution Comments: dense L hemi with inability to initiate motor control with R hemibody, easily distracted Restrictions Weight Bearing Restrictions: No  Pain:  No pain related this session.   Therapy/Group: Individual Therapy  [name] PT, DPT, CSRS 11/30/2022, 7:43 AM

## 2022-11-30 NOTE — Progress Notes (Signed)
Physical Therapy Session Note  Patient Details  Name: PREET PERRIER MRN: [id number] Date of Birth: Jan 01, 1946  {CHL IP REHAB PT TIME CALCULATION:304800500}  Short Term Goals: {WUJ:8119147}  Skilled Therapeutic Interventions/Progress Updates:  Ambulation/gait training;Discharge planning;Functional mobility training;Psychosocial support;Therapeutic Activities;Visual/perceptual remediation/compensation;Balance/vestibular training;Disease management/prevention;Neuromuscular re-education;Skin care/wound management;Wheelchair propulsion/positioning;UE/LE Strength taining/ROM;Splinting/orthotics;DME/adaptive equipment instruction;Cognitive remediation/compensation;Community reintegration;Patient/family education;Stair training;UE/LE Coordination activities;Functional electrical stimulation;Therapeutic Exercise   Therapy Documentation Precautions:  Precautions Precautions: Fall Precaution Comments: dense L hemi with inability to initiate motor control with R hemibody, easily distracted Restrictions Weight Bearing Restrictions: No General:   Vital Signs: Therapy Vitals Temp: 98.5 F (36.9 C) Temp Source: Oral Pulse Rate: 78 Resp: 16 BP: (!) 170/60 Patient Position (if appropriate): Lying Oxygen Therapy SpO2: 95 % O2 Device: Room Air Pain:   Mobility:   Locomotion :    Trunk/Postural Assessment :    Balance:   Exercises:   Other Treatments:      Therapy/Group: {Therapy/Group:3049007}  [name] 11/30/2022, 7:46 AM

## 2022-11-30 NOTE — Plan of Care (Signed)
  Problem: RH Balance Goal: LTG: Patient will maintain dynamic sitting balance (OT) Description: LTG:  Patient will maintain dynamic sitting balance with assistance during activities of daily living (OT) Flowsheets (Taken 11/30/2022 1305) LTG: Pt will maintain dynamic sitting balance during ADLs with: (LTG downgraded due to slow progress.) Moderate Assistance - Patient 50 - 74% Note: LTG downgraded due to slow progress.  Goal: LTG Patient will maintain dynamic standing with ADLs (OT) Description: LTG:  Patient will maintain dynamic standing balance with assist during activities of daily living (OT)  Flowsheets (Taken 11/30/2022 1305) LTG: Pt will maintain dynamic standing balance during ADLs with: (LTG downgraded due to slow progress.) Maximal Assistance - Patient 25 - 49% Note: LTG downgraded due to slow progress.    Problem: Sit to Stand Goal: LTG:  Patient will perform sit to stand in prep for activites of daily living with assistance level (OT) Description: LTG:  Patient will perform sit to stand in prep for activites of daily living with assistance level (OT) Flowsheets (Taken 11/30/2022 1305) LTG: PT will perform sit to stand in prep for activites of daily living with assistance level: (LTG downgraded due to slow progress.) Moderate Assistance - Patient 50 - 74% Note: LTG downgraded due to slow progress.    Problem: RH Bathing Goal: LTG Patient will bathe all body parts with assist levels (OT) Description: LTG: Patient will bathe all body parts with assist levels (OT) Flowsheets (Taken 11/30/2022 1305) LTG: Pt will perform bathing with assistance level/cueing: (LTG downgraded due to slow progress.) Moderate Assistance - Patient 50 - 74% Note: LTG downgraded due to slow progress.    Problem: RH Dressing Goal: LTG Patient will perform lower body dressing w/assist (OT) Description: LTG: Patient will perform lower body dressing with assist, with/without cues in positioning using equipment  (OT) Flowsheets (Taken 11/30/2022 1305) LTG: Pt will perform lower body dressing with assistance level of: (LTG downgraded due to slow progress.) Maximal Assistance - Patient 25 - 49% Note: LTG downgraded due to slow progress.    Problem: RH Toileting Goal: LTG Patient will perform toileting task (3/3 steps) with assistance level (OT) Description: LTG: Patient will perform toileting task (3/3 steps) with assistance level (OT)  Flowsheets (Taken 11/30/2022 1305) LTG: Pt will perform toileting task (3/3 steps) with assistance level: (LTG discontinued at this time as pt is not safe to sit on a BSC or a toilet.) -- Note: LTG discontinued at this time as pt is not safe to sit on a BSC or a toilet.    Problem: RH Toilet Transfers Goal: LTG Patient will perform toilet transfers w/assist (OT) Description: LTG: Patient will perform toilet transfers with assist, with/without cues using equipment (OT) Flowsheets (Taken 11/30/2022 1305) LTG: Pt will perform toilet transfers with assistance level of: (LTG discontinued at this time as pt is not safe to sit on a BSC or a toilet.) -- Note: LTG discontinued at this time as pt is not safe to sit on a BSC or a toilet.    Problem: RH Tub/Shower Transfers Goal: LTG Patient will perform tub/shower transfers w/assist (OT) Description: LTG: Patient will perform tub/shower transfers with assist, with/without cues using equipment (OT) Flowsheets (Taken 11/30/2022 1305) LTG: Pt will perform tub/shower stall transfers with assistance level of: (LTG discontinued at this time as pt is not safe to sit on shower bench.) -- Note: LTG discontinued at this time as pt is not safe to sit on shower bench.

## 2022-11-30 NOTE — Patient Care Conference (Signed)
Inpatient RehabilitationTeam Conference and Plan of Care Update Date: 11/30/2022   Time: 10:24 AM    Patient Name: Samuel Pearson      Medical Record Number: [id number]  Date of Birth: 02-20-1946 Sex: Male         Room/Bed: 4M03C/4M03C-01 Payor Info: Payor: Multimedia programmer / Plan: [facility] MEDICARE / Product Type: *No Product type* /    Admit Date/Time:  11/14/2022  4:37 PM  Primary Diagnosis:  CVA (cerebral vascular accident) [facility])  Hospital Problems: Principal Problem:   CVA (cerebral vascular accident) [facility])    Expected Discharge Date: Expected Discharge Date: 12/13/22  Team Members Present: Physician leading conference: Dr. Fanny Dance Social Worker Present: Lavera Guise, BSW Nurse Present: Chana Bode, RN PT Present: Ralph Leyden, PT OT Present: Primitivo Gauze, OT SLP Present: Other (comment) Alvera Novel, SLP) PPS Coordinator present : Fae Pippin, SLP     Current Status/Progress Goal Weekly Team Focus  Bowel/Bladder   Patient is incontinent frequently of bowel and bladder   Patient to reduce incidents of incontinence.   Offer toileting more often.  Make urinal more accessable.    Swallow/Nutrition/ Hydration              ADL's   mild progress made in terms of intiation during mobility tasks and transitional movment. pt continues to present with impairments in postural control, L neglect, L hemiplegia. pt has made progress with LB dressing from bed level able to bridge in bed. mild continued improvements in response time.   mod A   improving postural control to enable pt to sit safely on BSC to develop continence, improving awareness of L side to improve safety with mobility    Mobility   Bed mobility = Max/TotA, transfers = MaxA, sitting balance = TotA with no initiation to correct in any direction, but will engage trunk with reaching activity to anterior, right side with MaxA to maintain position; ambulation initiated requiring MaxA +2 with  3 musketeer support for L side and to maintain upright trunk posture reaching 6 steps.   MaxA overall - continue to monitor for downgrading  Barriers: continued L hemiplegia with slow processing/ planning /// Work on: awareness, initiation, motor planning, motor control of unaffected side, midline orientation    Communication                Safety/Cognition/ Behavioral Observations  verbal responses ~90% of opportunties given adequate response time. mod A w/ problem solving, increased difficulty in utilizing calendar this week compared to prior. increased attention and emergent awareness regarding L side   mod I (orientation) supervision   mildly complex problem solving, orientation to time    Pain   Patient usually denies pain.  Patient shall continue to remain pain free.   Encourage pain rating.  Also use faces scale due to delayed response.    Skin   Patient currently has no skin breakdown.  Rash on upper body and groin are nearly healed.   Patient shall remain free of skin breakdown.  Decrease episodes of incontinence to reduce change of breakdown.      Discharge Planning:  Discjarging home with spouse and daughter. Daughter unable to assist daily. Spouse and daughter currently in the discussion of HC VS. SNF based on LOC.   Team Discussion: Patient with B12 reaction/rash on steroid taper. Ileus resolved and BP monitored.    Patient on target to meet rehab goals: no, currently note small changes in processing and response speed and  improvement in awareness of left side however continues to require total assist to correct posture (leaning) and continues to present with functional activation deficits.  Working on use of orientation cues, problem solving and emerging awareness of the left side.   *See Care Plan and progress notes for long and short-term goals.   Revisions to Treatment Plan:  Michiel Sites Advice worker style ambulation   Teaching Needs: Safety,  medications, skin care, dietary modification, transfers, toileting, etc.   Current Barriers to Discharge: Incontinence and Lack of/limited family support  Possible Resolutions to Barriers: HH follow up services Private duty care attendants recommended vs SNF     Medical Summary Current Status: improved arousal, drug rash caused by b12--improving off b12 and with steroids. bp well controlled. seems to be sleeping well.  Barriers to Discharge: Medical stability   Possible Resolutions to Barriers/Weekly Focus: daily assessment of labs and pt data/vs. tapering steroids to off   Continued Need for Acute Rehabilitation Level of Care: The patient requires daily medical management by a physician with specialized training in physical medicine and rehabilitation for the following reasons: Direction of a multidisciplinary physical rehabilitation program to maximize functional independence : Yes Medical management of patient stability for increased activity during participation in an intensive rehabilitation regime.: Yes Analysis of laboratory values and/or radiology reports with any subsequent need for medication adjustment and/or medical intervention. : Yes   I attest that I was present, lead the team conference, and concur with the assessment and plan of the team.   Chana Bode B 11/30/2022, 1:42 PM

## 2022-11-30 NOTE — Progress Notes (Signed)
PROGRESS NOTE   Subjective/Complaints:  Pt without issues overnight. No pain. Happy that he's been able to walk a bit with therapies  ROS: Limited due to cognitive/behavioral      Objective:   No results found. Recent Labs    11/28/22 0742  WBC 8.1  HGB 13.4  HCT 40.0  PLT 272     Recent Labs    11/28/22 0742  NA 136  K 3.7  CL 106  CO2 23  GLUCOSE 99  BUN 14  CREATININE 0.61  CALCIUM 8.5*      Intake/Output Summary (Last 24 hours) at 11/30/2022 0830 Last data filed at 11/29/2022 1807 Gross per 24 hour  Intake 560 ml  Output --  Net 560 ml        Physical Exam: Vital Signs Blood pressure (!) 170/60, pulse 78, temperature 98.5 F (36.9 C), temperature source Oral, resp. rate 16, height 5\' 6"  (1.676 m), SpO2 95%.   Constitutional: No distress . Vital signs reviewed. HEENT: NCAT, EOMI, oral membranes moist Neck: supple Cardiovascular: RRR without murmur. No JVD    Respiratory/Chest: CTA Bilaterally without wheezes or rales. Normal effort    GI/Abdomen: BS +, non-tender, non-distended Ext: no clubbing, cyanosis, or edema Psych: pleasant and cooperative  Skin: diminishing maculopapular rash on arms and legs Neurologic: very bright, more engaging. continues with delayed responses but oriented to person , place and situation , can respond to basic questions with extra time and repetition---improved response time from we last met! + Right hemi-neglect is improving--attending to right sides with cues   Antigravity and against resistance in the right upper and lower extremity and 0/5 left deltoid, bicep, tricep, grip, hip flexor, knee extensors, ankle dorsiflexor and plantar flexor but very deliberate. Sensory exam normal sensation to LT in bilateral upper and lower extremities Musculoskeletal: Full range of motion in all 4 extremities. No joint swelling   Assessment/Plan: 1. Functional deficits  which require 3+ hours per day of interdisciplinary therapy in a comprehensive inpatient rehab setting. Physiatrist is providing close team supervision and 24 hour management of active medical problems listed below. Physiatrist and rehab team continue to assess barriers to discharge/monitor patient progress toward functional and medical goals  Care Tool:  Bathing    Body parts bathed by patient: Chest, Abdomen, Face   Body parts bathed by helper: Left arm, Right arm, Front perineal area, Buttocks, Left upper leg, Right upper leg, Right lower leg, Left lower leg     Bathing assist Assist Level: Maximal Assistance - Patient 24 - 49%     Upper Body Dressing/Undressing Upper body dressing   What is the patient wearing?: Pull over shirt    Upper body assist Assist Level: 2 Helpers    Lower Body Dressing/Undressing Lower body dressing      What is the patient wearing?: Incontinence brief, Pants     Lower body assist Assist for lower body dressing: 2 Helpers     Toileting Toileting    Toileting assist Assist for toileting: Dependent - Patient 0%     Transfers Chair/bed transfer  Transfers assist  Chair/bed transfer activity did not occur: Safety/medical concerns  Chair/bed transfer assist  level: Dependent - mechanical lift     Locomotion Ambulation   Ambulation assist   Ambulation activity did not occur: Safety/medical concerns          Walk 10 feet activity   Assist  Walk 10 feet activity did not occur: Safety/medical concerns        Walk 50 feet activity   Assist Walk 50 feet with 2 turns activity did not occur: Safety/medical concerns         Walk 150 feet activity   Assist Walk 150 feet activity did not occur: Safety/medical concerns         Walk 10 feet on uneven surface  activity   Assist Walk 10 feet on uneven surfaces activity did not occur: Safety/medical concerns         Wheelchair     Assist Is the patient using  a wheelchair?: Yes Type of Wheelchair: Manual Wheelchair activity did not occur: Safety/medical concerns         Wheelchair 50 feet with 2 turns activity    Assist    Wheelchair 50 feet with 2 turns activity did not occur: Safety/medical concerns       Wheelchair 150 feet activity     Assist  Wheelchair 150 feet activity did not occur: Safety/medical concerns       Blood pressure (!) 170/60, pulse 78, temperature 98.5 F (36.9 C), temperature source Oral, resp. rate 16, height 5\' 6"  (1.676 m), SpO2 95%.  Medical Problem List and Plan: 1. 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 Interdisciplinary team conference today to discuss goals, barriers to discharge, and dc planning.     2.  Antithrombotics: -DVT/anticoagulation:  Pharmaceutical:             left tib and peroneal DVTs, started on Eliquis, pt asymptomatic ,Repeat doppler showing resolution of calf DVT cont Lovenox - no f/u doppler needed             -antiplatelet therapy: Aspirin and Plavix for three weeks followed by Plavix alone.  Last dose of aspirin will be 7/29   -7/23 resumed plavix without new rash-   3. Pain Management: Tylenol as needed   4. Mood/Behavior/Sleep: LCSW to evaluate and provide emotional support             -antipsychotic agents: n/a Add low dose ritalin for initiation issues    5. Neuropsych/cognition: This patient is not capable of making decisions on his own behalf.             -pt has hx of ETOH abuse, 2-3 beers + wine most nights 6. Skin/Wound Care -maculopapular rash--likely d/t b12 which was stopped -rash much improved -steroid tapering off    7.  Fluids/Electrolytes/Nutrition: Routine Is and Os and follow-up chemistries Monday             -continue multivit, thiamine. He's eating well  -B12 17/2 was 204; discontinue p.o., can retest as outpatient   8: Hypertension: monitor TID and prn (home meds: lisinopril,  Lopressor)             -continue amlodipine 5 mg daily Vitals:   11/29/22 1826 11/30/22 0504  BP: (!) 160/55 (!) 170/60  Pulse: 92 78  Resp: 16 16  Temp: 98.3 F (36.8 C) 98.5 F (36.9 C)  SpO2: 96% 95%    --fair control 7/24--may need further adjustment to regimen              9: Hyperlipidemia: continue statin   10: Prediabetes: ? diet controlled; A1c = 5.7%   11: Macrocytic anemia/B12 deficiency: follow-up CBC- Hgb normal /improved 7/15             -hx of pernicious anemia; PCP rec: daily B12    Latest Ref Rng & Units 11/28/2022    7:42 AM 11/21/2022    6:27 AM 11/15/2022    5:46 AM  CBC  WBC 4.0 - 10.5 K/uL 8.1  9.7  7.7   Hemoglobin 13.0 - 17.0 g/dL 16.1  09.6  04.5   Hematocrit 39.0 - 52.0 % 40.0  40.6  35.9   Platelets 150 - 400 K/uL 272  283  214     12. Ileus: Resolved 7/16             -moving bowels now. Last BM 7/15             -senna-s at bedtime             -PPI, prn simethicone 13. 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 Thyroid nodule-TFT's essentially WNL             -f/u as outpt, thyroid U/S, etc.     LOS: 16 days A FACE TO FACE EVALUATION WAS PERFORMED  Ranelle Oyster 11/30/2022, 8:30 AM

## 2022-12-01 MED ORDER — LISINOPRIL 5 MG PO TABS
5.0000 mg | ORAL_TABLET | Freq: Every day | ORAL | Status: DC
  Administered 2022-12-01 – 2022-12-03 (×3): 5 mg via ORAL
  Filled 2022-12-01 (×3): qty 1

## 2022-12-01 MED ORDER — METHYLPHENIDATE HCL 5 MG PO TABS
10.0000 mg | ORAL_TABLET | Freq: Two times a day (BID) | ORAL | Status: DC
  Administered 2022-12-01 – 2022-12-09 (×17): 10 mg via ORAL
  Filled 2022-12-01 (×17): qty 2

## 2022-12-01 NOTE — Progress Notes (Addendum)
PROGRESS NOTE   Subjective/Complaints:  Up with PT in room trying to transfer to [facility]. Pt pushing quite a bit, has a hard time engaging lower half to extend once up off of bed. Slept ok last night  ROS: Limited due to cognitive/behavioral      Objective:   No results found. No results for input(s): "WBC", "HGB", "HCT", "PLT" in the last 72 hours.    No results for input(s): "NA", "K", "CL", "CO2", "GLUCOSE", "BUN", "CREATININE", "CALCIUM" in the last 72 hours.     Intake/Output Summary (Last 24 hours) at 12/01/2022 0916 Last data filed at 12/01/2022 3329 Gross per 24 hour  Intake 720 ml  Output 350 ml  Net 370 ml        Physical Exam: Vital Signs Blood pressure (!) 151/57, pulse 78, temperature 98.3 F (36.8 C), resp. rate 16, height 5\' 6"  (1.676 m), SpO2 95%.   Constitutional: No distress . Vital signs reviewed. HEENT: NCAT, EOMI, oral membranes moist Neck: supple Cardiovascular: RRR without murmur. No JVD    Respiratory/Chest: CTA Bilaterally without wheezes or rales. Normal effort    GI/Abdomen: BS +, non-tender, non-distended Ext: no clubbing, cyanosis, or edema Psych: flat but cooperative  Skin: rash nearly resolved Neurologic: very bright, more engaging. continues with delayed responses but oriented to person , place and situation , can respond to basic questions with extra time and repetition--- + Right hemi-neglect is improving--attending to right sides with cues   Antigravity and against resistance in the right upper and lower extremity and 0/5 left deltoid, bicep, tricep, grip, hip flexor, knee extensors, ankle dorsiflexor and plantar flexor but very deliberate. Has difficulty initiated and synchronizing movement on right and in general to an extent. Sensory exam normal sensation to LT in bilateral upper and lower extremities Musculoskeletal: Full range of motion in all 4 extremities. No joint  swelling   Assessment/Plan: 1. Functional deficits which require 3+ hours per day of interdisciplinary therapy in a comprehensive inpatient rehab setting. Physiatrist is providing close team supervision and 24 hour management of active medical problems listed below. Physiatrist and rehab team continue to assess barriers to discharge/monitor patient progress toward functional and medical goals  Care Tool:  Bathing    Body parts bathed by patient: Chest, Abdomen, Face, Left arm, Right upper leg   Body parts bathed by helper: Right arm, Front perineal area, Buttocks, Left lower leg, Right upper leg     Bathing assist Assist Level: Moderate Assistance - Patient 50 - 74%     Upper Body Dressing/Undressing Upper body dressing   What is the patient wearing?: Pull over shirt    Upper body assist Assist Level: Maximal Assistance - Patient 25 - 49%    Lower Body Dressing/Undressing Lower body dressing      What is the patient wearing?: Incontinence brief, Pants     Lower body assist Assist for lower body dressing: 2 Helpers     Toileting Toileting    Toileting assist Assist for toileting: Dependent - Patient 0%     Transfers Chair/bed transfer  Transfers assist  Chair/bed transfer activity did not occur: Safety/medical concerns  Chair/bed transfer assist level: Dependent -  mechanical lift     Locomotion Ambulation   Ambulation assist   Ambulation activity did not occur: Safety/medical concerns          Walk 10 feet activity   Assist  Walk 10 feet activity did not occur: Safety/medical concerns        Walk 50 feet activity   Assist Walk 50 feet with 2 turns activity did not occur: Safety/medical concerns         Walk 150 feet activity   Assist Walk 150 feet activity did not occur: Safety/medical concerns         Walk 10 feet on uneven surface  activity   Assist Walk 10 feet on uneven surfaces activity did not occur: Safety/medical  concerns         Wheelchair     Assist Is the patient using a wheelchair?: Yes Type of Wheelchair: Manual Wheelchair activity did not occur: Safety/medical concerns         Wheelchair 50 feet with 2 turns activity    Assist    Wheelchair 50 feet with 2 turns activity did not occur: Safety/medical concerns       Wheelchair 150 feet activity     Assist  Wheelchair 150 feet activity did not occur: Safety/medical concerns       Blood pressure (!) 151/57, pulse 78, temperature 98.3 F (36.8 C), resp. rate 16, height 5\' 6"  (1.676 m), SpO2 95%.  Medical Problem List and Plan: 1. 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  Antithrombotics: -DVT/anticoagulation:  Pharmaceutical:             left tib and peroneal DVTs, started on Eliquis, pt asymptomatic ,Repeat doppler showing resolution of calf DVT cont Lovenox - no f/u doppler needed             -antiplatelet therapy: Aspirin and Plavix for three weeks followed by Plavix alone.  Last dose of aspirin will be 7/29   -7/23 resumed plavix without any problems   3. Pain Management: Tylenol as needed   4. Mood/Behavior/Sleep: LCSW to evaluate and provide emotional support             -antipsychotic agents: n/a Add low dose ritalin for initiation issues---7/25 increase to 10mg     5. Neuropsych/cognition: This patient is not capable of making decisions on his own behalf.             -pt has hx of ETOH abuse, 2-3 beers + wine most nights 6. Skin/Wound Care -maculopapular rash--likely d/t b12 which was stopped -rash resolved -steroid taper completed    7. Fluids/Electrolytes/Nutrition: Routine Is and Os  and follow-up chemistries Monday             -continue multivit, thiamine. He's eating well  -B12 17/2 was 204; discontinue p.o., can retest as outpatient   8: Hypertension: monitor TID and prn (home meds: lisinopril, Lopressor)               Vitals:  11/30/22 2027 12/01/22 0345  BP: (!) 164/84 (!) 151/57  Pulse: 82 78  Resp: 17 16  Temp: 98.7 F (37.1 C) 98.3 F (36.8 C)  SpO2: 96% 95%    --persistent systolic elevation  -7/25 pt on amlodipine 10mg , resume lisinopril at 5mg  qd               9: Hyperlipidemia: continue statin   10: Prediabetes: ? diet controlled; A1c = 5.7%   11: Macrocytic anemia/B12 deficiency: follow-up CBC- Hgb normal /improved 7/15             -hx of pernicious anemia; PCP rec: daily B12    Latest Ref Rng & Units 11/28/2022    7:42 AM 11/21/2022    6:27 AM 11/15/2022    5:46 AM  CBC  WBC 4.0 - 10.5 K/uL 8.1  9.7  7.7   Hemoglobin 13.0 - 17.0 g/dL 16.1  09.6  04.5   Hematocrit 39.0 - 52.0 % 40.0  40.6  35.9   Platelets 150 - 400 K/uL 272  283  214     12. Ileus: Resolved 7/16             -moving bowels now. Last BM 7/15             -senna-s at bedtime             -PPI, prn simethicone 13. 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 Thyroid nodule-TFT's essentially WNL             -f/u as outpt, thyroid U/S, etc.     LOS: 17 days A FACE TO FACE EVALUATION WAS PERFORMED  Ranelle Oyster 12/01/2022, 9:16 AM

## 2022-12-01 NOTE — Progress Notes (Signed)
Physical Therapy Session Note  Patient Details  Name: Samuel Pearson MRN: [id number] Date of Birth: 1946-02-05  Today's Date: 12/01/2022 PT Individual Time: 0805-0920 PT Individual Time Calculation (min): 75 min   Short Term Goals: Week 1:  PT Short Term Goal 1 (Week 1): Pt will perform all aspects of bedmobility with consistent MaxA. PT Short Term Goal 1 - Progress (Week 1): Progressing toward goal PT Short Term Goal 2 (Week 1): Pt will perform sit<>stand with MaxA +1 PT Short Term Goal 2 - Progress (Week 1): Progressing toward goal PT Short Term Goal 3 (Week 1): Pt will perform seat to seat transfers with MaxA +1 PT Short Term Goal 3 - Progress (Week 1): Progressing toward goal PT Short Term Goal 4 (Week 1): Pt will initiate sitting balance and maintain with MaxA+1. PT Short Term Goal 4 - Progress (Week 1): Progressing toward goal PT Short Term Goal 5 (Week 1): Pt will initiate gait training. PT Short Term Goal 5 - Progress (Week 1): Progressing toward goal Week 2:  PT Short Term Goal 1 (Week 2): Pt will perform all aspects of bed mobility with consistent MaxA. PT Short Term Goal 1 - Progress (Week 2): Progressing toward goal PT Short Term Goal 2 (Week 2): Pt will perform sit<>stand with MaxA +1 PT Short Term Goal 2 - Progress (Week 2): Not met PT Short Term Goal 3 (Week 2): Pt will perform seat to seat transfers with MaxA +1 PT Short Term Goal 3 - Progress (Week 2): Not met PT Short Term Goal 4 (Week 2): Pt will initiate sitting balance and maintain with MaxA+1. PT Short Term Goal 4 - Progress (Week 2): Not met PT Short Term Goal 5 (Week 2): Pt will initiate gait training. PT Short Term Goal 5 - Progress (Week 2): Met  Skilled Therapeutic Interventions/Progress Updates: Patient supine in bed (HOB elevated) on entrance to room. Patient alert and agreeable to PT session.   Patient reported no pain at beginning of session.   Therapeutic Activity: Bed Mobility: Pt with residual BM  and required posterior peri-care. Pt supine to L sidelying with light modA (multimodal cues for pt to perform bridge to L with R LE into bed). Pt initiated sequence. Pt supine to R sidelying with totalA. TotalA to doff/donn brief, and maxA to donn personal pants (cues to bridge with R LE to doff shorts to waist). Pt supine to L sidelying with pt initiating sequence after cued to get to sitting EOB (L hand on hand rail and R LE bridging hips over) on L side of bed. Pt with heavy mod/maxA to get to sitting EOB, and cues to release R UE from rail and to push into bed. Pt noted to have increased push to L this session compared to previous sessions this week.  Transfers: Pt performed sit to stand from EOB to STEDY with maxA and cues to use R UE to tap hands anteriorly to the R in order to bring trunk to midline. Pt with maxA totalA to get centered into STEDY. Pt cued to count to 3 and initiate stand from EOB with heavy modA, but max/totalA to extend through hips to place STEDY pads underneath glutes.   Neuromuscular Re-ed: NMR facilitated during session with focus on sustained attention while in a distracting environment. - Pt cued to perform LAQ on R LE while counting to 10 (max cues at first, then min cues). Pt then cued to raise R UE in air and to count  backwards from 10 (cycle back and forth between LAW and R shoulder flexion in static hold). Pt then cued to maintain R UE in air while performing LAQ (counting to 10).  - pt cued to reach to R with R UE towards green cones on table while PTA bounces a ball in close proximity, and then to bring self back towards midline (cue to bring posterior head back in contact with head rest). Mirror placed in front of patient for visual cue of midline orientation. Pt noted to have decrease in L lean in WC, and decreased push through R LE on WC leg rest).   NMR performed for improvements in motor control and coordination, balance, sequencing, judgement, and self confidence/  efficacy in performing all aspects of mobility at highest level of independence.   Patient in [facility] at end of session with brakes locked, wife present, belt alarm set, and all needs within reach.      Therapy Documentation Precautions:  Precautions Precautions: Fall Precaution Comments: dense L hemi with inability to initiate motor control with R hemibody, easily distracted Restrictions Weight Bearing Restrictions: No  Therapy/Group: Individual Therapy  Samuel Pearson PTA 12/01/2022, 12:17 PM

## 2022-12-01 NOTE — Progress Notes (Signed)
Occupational Therapy Session Note  Patient Details  Name: Samuel Pearson MRN: [id number] Date of Birth: Sep 27, 1945  Today's Date: 12/01/2022 OT Individual Time: 1300-1355 OT Individual Time Calculation (min): 55 min    Short Term Goals: Week 3:  OT Short Term Goal 1 (Week 3): Pt will be able use his R arm to correct his postition to midline with CGA and min cues when he is leaning to the left in the bed. OT Short Term Goal 2 (Week 3): Pt will demonstrate improved ability to follow directions to don shirt with mod A and mod cues. OT Short Term Goal 3 (Week 3): Pt will maintain static sitting with max A of 1.  Skilled Therapeutic Interventions/Progress Updates:  Pt greeted seated in w/c, pt reports BM. Utilized stedy to transfer back to bed with an emphasis on initiating volitional movement and midline orientation. Pt continues to require MAX- total A +2 for sit>stand and to maintain static sitting balance on perch of stedy. Worked on static sitting balance from EOB with an emphasis on targeted reaching to R side as pt continues to lean heavily to L side. Transitioned pt back to supine with MAX A +2.   Pericare and brief change from bed level with a focus on directing level of care and initiating reaching for bed rails during rolling as well as protecting hemiparetic arm during bed mobility.   Ended session by working on closed chain reaching with RUE to facilitate improved posture and sitting balance. Pt positioned in chair position from bed level with pt instructed to reach RUE up to top of wedge on pts R side with mirror placed in front of pt. Pt needed MAX multimodal cues to problem solve motor planning tasks with pt continuing to only lean his head vs trunk. Worked on anterior weight shifts with pt holding onto LUE and sheet placed around pts UB, pt cued to reach BUEs forward while therapist sat in front of pt and assisted by shifting trunk anteriorly as precursor to higher level functional  mobility. Pt required MAX A to fully shift trunk forward.   Ended session with pt in chair position in bed with bed alarm activated and all needs within reach.   Therapy Documentation Precautions:  Precautions Precautions: Fall Precaution Comments: dense L hemi with inability to initiate motor control with R hemibody, easily distracted Restrictions Weight Bearing Restrictions: No  Pain: No pain indicated during session    Therapy/Group: Individual Therapy  Barron Schmid 12/01/2022, 3:32 PM

## 2022-12-01 NOTE — Progress Notes (Signed)
Speech Language Pathology Daily Session Note  Patient Details  Name: Samuel Pearson MRN: [id number] Date of Birth: Aug 18, 1945  Today's Date: 12/01/2022 SLP Individual Time: 1101-1201 SLP Individual Time Calculation (min): 60 min  Short Term Goals: Week 3: SLP Short Term Goal 1 (Week 3): Patient will demonstrate orientation to time with min multimodal cues SLP Short Term Goal 2 (Week 3): Patient will answer mildly complex problem solving questions with supervision multimodal A SLP Short Term Goal 3 (Week 3): Patient will utilize memory compensatory strategies with supervision multimodal A SLP Short Term Goal 4 (Week 3): Patient will demonstrate selective/sutained attention for >20 minutes with min multimodal A SLP Short Term Goal 5 (Week 3): Patient will initiate verbal responses in 75% of opportunities with supervision verbal cues SLP Short Term Goal 6 (Week 3): Patient will demonstrate intellectual awareness by verbalizing two physical and two cognitive impairments min multimodal A  Skilled Therapeutic Interventions: Skilled therapy session focused on attention and awareness goals. Attention was addressed through SLP providing min cues for patient to attend to L side while touring hospital. Patient able to answer questions about objects on his left with 100% accuracy. Awareness addressed through patients ability to recall cognitive and physical improvements since admission to rehab. Patient recalled improvements in L attention, walking, and initiation of verbal output with supervision A. Patient with timely verbal responses this date and initiation in 100% of opportunities with mod I assist. Patient able to orient to time with mod I assist utilizing calendar as an external aid. Patient left in [facility] with alarm on and call bell in hand. Wife present. Continue with POC.   Pain Denies Pain  Therapy/Group: Individual Therapy  Wisdom Rickey M.A., CF-SLP 12/01/2022, 12:34 PM

## 2022-12-01 NOTE — Plan of Care (Signed)
  Problem: Coping: Goal: Will verbalize positive feelings about self Outcome: Progressing Goal: Will identify appropriate support needs Outcome: Progressing   

## 2022-12-02 NOTE — Progress Notes (Signed)
Occupational Therapy Session Note  Patient Details  Name: Samuel Pearson MRN: [id number] Date of Birth: Jul 24, 1945  Today's Date: 12/02/2022 OT Individual Time: 1032-1105 session 1  OT Individual Time Calculation (min): 33 min  Session 2: 7829-5621   Short Term Goals: Week 3:  OT Short Term Goal 1 (Week 3): Pt will be able use his R arm to correct his postition to midline with CGA and min cues when he is leaning to the left in the bed. OT Short Term Goal 2 (Week 3): Pt will demonstrate improved ability to follow directions to don shirt with mod A and mod cues. OT Short Term Goal 3 (Week 3): Pt will maintain static sitting with max A of 1.  Skilled Therapeutic Interventions/Progress Updates:  Session 1: Pt greeted seated in w/c. Pt agreeable to session, pt requesting to work on estim.   1:1 NMES applied to L wrist extensors at the below settings:    Ratio 1:3 Rate 35 pps Waveform- Asymmetric Ramp 1.0 Pulse 300 Intensity- 27  Duration -   15 mins  Worked on functional grasp with compliant cube as precursor to higher level ADLs.  Wife present during session with wife asking appropriate questions about level of care at [place] with wife leaning towards SNF post CIR. Recommended that wife go visit/call potential facilities.                      Ended session with pt seated in w/c with all needs within reach and safety belt alarm activated.                    Session 2: Pt greeted seated in w/c, pt agreeable to OT intervention. Session focus on BADL reeducation, functional mobility, dynamic sitting balance and decreasing overall caregiver burden.            Utilized stedy for shower transfer with pt able to stand to stedy with MAX A +2, pt did well with cue of placing RUE around therapists back while second person rolled stedy into shower stall. Pt sat on BSC for shower, pt given cue of "keep R elbow on grab bar" during shower with pt sitting with fair static sitting balance, however pt did  need MAX multimodal cues to keep RUE on grab bar as pt continues to be get distracted. Provided total A for bathing as pt unable to dual task at this time needing to use all of his attention to maintain midline orientation.       Exited shower using stedy in same manner. Pt completed dressing from EOB with pt able to sit EOB statically with moments of MIN A when pt given a task to do with his RUE to eliminate any pushing with RUE. Pt able to recall correct hemi technique for donning [place] shirt and able to figure four LLE over RLE with RUE holding onto LLE from EOB. Pt stood from EOB with RUE holding onto pants with MAX A +2, but with RUE given task pts sit>stands where noticeable better d/t pt not being able to push through RUE.        Ended session with pt supine in bed with all needs within reach and bed alarm activated.                    Therapy Documentation Precautions:  Precautions Precautions: Fall Precaution Comments: dense L hemi with inability to initiate motor control with R hemibody, easily distracted Restrictions  Weight Bearing Restrictions: No    Pain: Pain Assessment Pain Scale: 0-10 Pain Score: 0-No pain    Therapy/Group: Individual Therapy  Pollyann Glen [facility] 12/02/2022, 12:15 PM

## 2022-12-02 NOTE — Progress Notes (Signed)
Speech Language Pathology Daily Session Note  Patient Details  Name: Samuel Pearson MRN: [id number] Date of Birth: Nov 28, 1945  Today's Date: 12/02/2022 SLP Individual Time: 0901-1001 SLP Individual Time Calculation (min): 60 min  Short Term Goals: Week 3: SLP Short Term Goal 1 (Week 3): Patient will demonstrate orientation to time with min multimodal cues SLP Short Term Goal 2 (Week 3): Patient will answer mildly complex problem solving questions with supervision multimodal A SLP Short Term Goal 3 (Week 3): Patient will utilize memory compensatory strategies with supervision multimodal A SLP Short Term Goal 4 (Week 3): Patient will demonstrate selective/sutained attention for >20 minutes with min multimodal A SLP Short Term Goal 5 (Week 3): Patient will initiate verbal responses in 75% of opportunities with supervision verbal cues SLP Short Term Goal 6 (Week 3): Patient will demonstrate intellectual awareness by verbalizing two physical and two cognitive impairments min multimodal A  Skilled Therapeutic Interventions: Skilled therapy session focused on cognitive goals. SLP facilitated session by providing min cues to encourage patient to attend to L side during trip outside. Patient required min verbal cues to attend to SLP session while outside in distracting environment (cars, people walking by). Upon return to room, patient able to follow directions regarding TV remote to turn volume up/down and change channels with minA. Therapy dog visited patient during ST session and patient with initiation of verbal responses to 75% of questions asked by therapy dog owner with min verbal reminders from SLP. Patient left in [facility] with alarm set and call bell in hand. Continue with POC.   Pain Pain Assessment Pain Scale: 0-10 Pain Score: 0-No pain  Therapy/Group: Individual Therapy  Brenisha Tsui M.A., CF-SLP 12/02/2022, 12:15 PM

## 2022-12-02 NOTE — Progress Notes (Signed)
Physical Therapy Session Note  Patient Details  Name: Samuel Pearson MRN: [id number] Date of Birth: [birth year]  Today's Date: 12/02/2022 PT Individual Time: 0804-0902 PT Individual Time Calculation (min): 58 min   Short Term Goals: Week 3:  PT Short Term Goal 1 (Week 3): Pt will perform all aspects of bed mobility with consistent MaxA. PT Short Term Goal 2 (Week 3): Pt will perform sit<>stand with MaxA +1 PT Short Term Goal 3 (Week 3): Pt will demo improved ability to initiate toward midline without need for external cues for reaching. Will initiate >20% of attempts. PT Short Term Goal 4 (Week 3): Pt will initiate sitting balance and maintain with MaxA+1. PT Short Term Goal 5 (Week 3): Pt will ambulate with MaxA +2 up to 20 steps.  Skilled Therapeutic Interventions/Progress Updates: Patient supine in bed on entrance to room. Patient alert and agreeable to PT session.   Patient reported no pain at beginning of session. Pt required personal care + 2 with NT present shortly after PT arrival. Pt totalA for personal care supine to R and L sidelying to donn/doff brief (BM noted) for time management.  Therapeutic Activity: Bed Mobility: Pt performed supine<>sit on EOB (L of bed) with mod/light maxA. VC required for pt to initiate movement (increased time required) and for pt to flex R knee to bridge hips over, and to use L UE to push in to bed (pt with heavy grip on L HOB rail and required tactile cue to remove hand to place on bed). Transfers: Pt performed sit<>stand transfer from EOB to STEDY with maxA. Pt cued to tap PTA's hand anteriorly to the R to bring pt midline to center per heavy L push and lean (max cues and assistance required + 2). Pt cued to inititate stand to STEDY by pulling with R UE on bar and to count to 3. Pt with heavy push on bar and required max cues to either release (cues to visually look at hand to remove) or to pull instead of push.   Neuromuscular Re-ed: NMR facilitated  during session with focus on dual tasking. - Pt cued to kick ball rolled by rehab tech while trying to knock over green cone on R and L side in order to work on postural control passed midline, and to increase cognitive load. Pt required max cues to stay on ask per presentation of stopping either reaching for the cone with weighted 1lb bar (originally used only hand, but pt increased participation and sustained focus with weighted object in hand) or kicking. Pt performed dual task minimally. This was also performed in a moderately distracting environment.   NMR performed for improvements in motor control and coordination, balance, sequencing, judgement, and self confidence/ efficacy in performing all aspects of mobility at highest level of independence.   Patient in [facility] at end of session with brakes locked, belt alarm set, and all needs within reach.      Therapy Documentation Precautions:  Precautions Precautions: Fall Precaution Comments: dense L hemi with inability to initiate motor control with R hemibody, easily distracted Restrictions Weight Bearing Restrictions: No  Therapy/Group: Individual Therapy  Mersedes Alber PTA 12/02/2022, 10:30 AM

## 2022-12-02 NOTE — Progress Notes (Signed)
Patient ID: Samuel Pearson, male   DOB: [birth year], 77 y.o.   MRN: [id number]  Sw received a follow up from patient spouse. Spouse and daughter have made the decision to attempt ST rehab. SW will request authorization on Monday 7/29. Family will discuss SNF preferences over the weekend. No additional questions or concerns.

## 2022-12-02 NOTE — Progress Notes (Signed)
PROGRESS NOTE   Subjective/Complaints:  Pt in bed. Just had an incontinent bm. No new issues this morning or overnight  ROS: Limited due to cognitive/behavioral      Objective:   No results found. No results for input(s): "WBC", "HGB", "HCT", "PLT" in the last 72 hours.    No results for input(s): "NA", "K", "CL", "CO2", "GLUCOSE", "BUN", "CREATININE", "CALCIUM" in the last 72 hours.     Intake/Output Summary (Last 24 hours) at 12/02/2022 0851 Last data filed at 12/02/2022 0806 Gross per 24 hour  Intake 800 ml  Output --  Net 800 ml        Physical Exam: Vital Signs Blood pressure (!) 155/63, pulse 77, temperature 99 F (37.2 C), temperature source Oral, resp. rate 19, height 5\' 6"  (1.676 m), SpO2 97%.   Constitutional: No distress . Vital signs reviewed. HEENT: NCAT, EOMI, oral membranes moist Neck: supple Cardiovascular: RRR without murmur. No JVD    Respiratory/Chest: CTA Bilaterally without wheezes or rales. Normal effort    GI/Abdomen: BS +, non-tender, non-distended Ext: no clubbing, cyanosis, or edema Psych: flat but cooperative  Skin: rash nearly resolved Neurologic: alert, delayed processing and engagement.  oriented to person , place and situation , can respond to basic questions with extra time and repetition--- + left hemi-neglect is improving. MMT: 4/5 RUE and RLE. Grossly 0-tr/5 left deltoid, bicep, tricep, grip, hip flexor, knee extensors, ankle dorsiflexor and plantar flexors.   Sensory exam normal sensation to LT in bilateral upper and lower extremities Musculoskeletal: Full range of motion in all 4 extremities. No joint swelling   Assessment/Plan: 1. Functional deficits which require 3+ hours per day of interdisciplinary therapy in a comprehensive inpatient rehab setting. Physiatrist is providing close team supervision and 24 hour management of active medical problems listed  below. Physiatrist and rehab team continue to assess barriers to discharge/monitor patient progress toward functional and medical goals  Care Tool:  Bathing    Body parts bathed by patient: Chest, Abdomen, Face, Left arm, Right upper leg   Body parts bathed by helper: Right arm, Front perineal area, Buttocks, Left lower leg, Right upper leg     Bathing assist Assist Level: Moderate Assistance - Patient 50 - 74%     Upper Body Dressing/Undressing Upper body dressing   What is the patient wearing?: Pull over shirt    Upper body assist Assist Level: Maximal Assistance - Patient 25 - 49%    Lower Body Dressing/Undressing Lower body dressing      What is the patient wearing?: Incontinence brief, Pants     Lower body assist Assist for lower body dressing: 2 Helpers     Toileting Toileting    Toileting assist Assist for toileting: Dependent - Patient 0%     Transfers Chair/bed transfer  Transfers assist  Chair/bed transfer activity did not occur: Safety/medical concerns  Chair/bed transfer assist level: Dependent - mechanical lift     Locomotion Ambulation   Ambulation assist   Ambulation activity did not occur: Safety/medical concerns          Walk 10 feet activity   Assist  Walk 10 feet activity did not occur: Safety/medical  concerns        Walk 50 feet activity   Assist Walk 50 feet with 2 turns activity did not occur: Safety/medical concerns         Walk 150 feet activity   Assist Walk 150 feet activity did not occur: Safety/medical concerns         Walk 10 feet on uneven surface  activity   Assist Walk 10 feet on uneven surfaces activity did not occur: Safety/medical concerns         Wheelchair     Assist Is the patient using a wheelchair?: Yes Type of Wheelchair: Manual Wheelchair activity did not occur: Safety/medical concerns         Wheelchair 50 feet with 2 turns activity    Assist    Wheelchair 50  feet with 2 turns activity did not occur: Safety/medical concerns       Wheelchair 150 feet activity     Assist  Wheelchair 150 feet activity did not occur: Safety/medical concerns       Blood pressure (!) 155/63, pulse 77, temperature 99 F (37.2 C), temperature source Oral, resp. rate 19, height 5\' 6"  (1.676 m), SpO2 97%.  Medical Problem List and Plan: 1. 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  Antithrombotics: -DVT/anticoagulation:  Pharmaceutical:             left tib and peroneal DVTs, started on Eliquis, pt asymptomatic ,Repeat doppler showing resolution of calf DVT cont Lovenox - no f/u doppler needed             -antiplatelet therapy: Aspirin and Plavix for three weeks followed by Plavix alone.  Last dose of aspirin will be 7/29   -7/23 resumed plavix without any problems   3. Pain Management: Tylenol as needed   4. Mood/Behavior/Sleep: LCSW to evaluate and provide emotional support             -antipsychotic agents: n/a Add low dose ritalin for initiation issues---7/25 increase to 10mg     5. Neuropsych/cognition: This patient is not capable of making decisions on his own behalf.             -pt has hx of ETOH abuse, 2-3 beers + wine most nights 6. Skin/Wound Care -maculopapular rash--likely d/t b12  -rash resolved with steroid taper and discontinuance of B12 supp    7. Fluids/Electrolytes/Nutrition: Routine Is and Os and follow-up chemistries Monday             -continue multivit, thiamine. He's eating well  -B12 17/2 was 204; discontinue p.o., can address as outpatient   8: Hypertension: monitor TID and prn (home meds: lisinopril, Lopressor)                Vitals:   12/01/22 1844 12/02/22 0409  BP: (!) 141/59 (!) 155/63  Pulse: 88 77  Resp: 20 19  Temp: 98.2 F (36.8 C) 99 F (37.2 C)  SpO2: 96% 97%    --persistent systolic elevation  -  7/25 pt on amlodipine 10mg , resume lisinopril at 5mg  qd              7/26--obsv bp today with lisinopril on board 9: Hyperlipidemia: continue statin   10: Prediabetes: ? diet controlled; A1c = 5.7%   11: Macrocytic anemia/B12 deficiency: follow-up CBC- Hgb normal /improved 7/15             -hx of pernicious anemia; PCP rec: daily B12    Latest Ref Rng & Units 11/28/2022    7:42 AM 11/21/2022    6:27 AM 11/15/2022    5:46 AM  CBC  WBC 4.0 - 10.5 K/uL 8.1  9.7  7.7   Hemoglobin 13.0 - 17.0 g/dL 16.1  09.6  04.5   Hematocrit 39.0 - 52.0 % 40.0  40.6  35.9   Platelets 150 - 400 K/uL 272  283  214     12. Ileus: Resolved 7/16             -moving bowels now. Last BM 7/15             -senna-s at bedtime             -PPI, prn simethicone 13. 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 Thyroid nodule-TFT's essentially WNL             -f/u as outpt, thyroid U/S, etc.     LOS: 18 days A FACE TO FACE EVALUATION WAS PERFORMED  Ranelle Oyster 12/02/2022, 8:51 AM

## 2022-12-02 NOTE — Progress Notes (Signed)
Patient ID: Samuel Pearson, male   DOB: Dec 02, 1945, 77 y.o.   MRN: [id number]  Sw left VM for patient spouse, Sw will wait for FU.

## 2022-12-02 NOTE — Progress Notes (Signed)
Patient ID: Samuel Pearson, male   DOB: 23-May-1945, 77 y.o.   MRN: [id number]  Patient social security number not listed. Sw will request from spouse to move forward with SNF arrangements.

## 2022-12-03 DIAGNOSIS — D539 Nutritional anemia, unspecified: Secondary | ICD-10-CM

## 2022-12-03 MED ORDER — LISINOPRIL 5 MG PO TABS
10.0000 mg | ORAL_TABLET | Freq: Every day | ORAL | Status: DC
  Administered 2022-12-04 – 2022-12-09 (×6): 10 mg via ORAL
  Filled 2022-12-03 (×6): qty 2

## 2022-12-03 NOTE — Plan of Care (Signed)
  Problem: Consults Goal: RH STROKE PATIENT EDUCATION Description: See Patient Education module for education specifics  Outcome: Progressing   Problem: RH BOWEL ELIMINATION Goal: RH STG MANAGE BOWEL WITH ASSISTANCE Description: STG Manage Bowel with min Assistance. Outcome: Progressing Goal: RH STG MANAGE BOWEL W/MEDICATION W/ASSISTANCE Description: STG Manage Bowel with Medication with mod I Assistance. Outcome: Progressing   Problem: RH BLADDER ELIMINATION Goal: RH STG MANAGE BLADDER WITH ASSISTANCE Description: STG Manage Bladder With min Assistance Outcome: Progressing   Problem: RH SAFETY Goal: RH STG ADHERE TO SAFETY PRECAUTIONS W/ASSISTANCE/DEVICE Description: STG Adhere to Safety Precautions With cues Assistance/Device. Outcome: Progressing   Problem: RH COGNITION-NURSING Goal: RH STG USES MEMORY AIDS/STRATEGIES W/ASSIST TO PROBLEM SOLVE Description: STG Uses Memory Aids/Strategies With cues Assistance to Problem Solve. Outcome: Progressing   Problem: RH KNOWLEDGE DEFICIT Goal: RH STG INCREASE KNOWLEDGE OF HYPERTENSION Description: Patient and spouse will be able to manage HTN with medications and dietary modifications using educational resources independendtly Outcome: Progressing Goal: RH STG INCREASE KNOWLEGDE OF HYPERLIPIDEMIA Description: Patient and spouse will be able to manage HLD with medications and dietary modifications using educational resources independendtly Outcome: Progressing Goal: RH STG INCREASE KNOWLEDGE OF STROKE PROPHYLAXIS Description: Patient and spouse will be able to manage secondary risks with medications and dietary modifications using educational resources independendtly Outcome: Progressing   Problem: Education: Goal: Knowledge of disease or condition will improve Outcome: Progressing Goal: Knowledge of secondary prevention will improve (MUST DOCUMENT ALL) Outcome: Progressing Goal: Knowledge of patient specific risk factors will  improve Loraine Leriche N/A or DELETE if not current risk factor) Outcome: Progressing   Problem: Ischemic Stroke/TIA Tissue Perfusion: Goal: Complications of ischemic stroke/TIA will be minimized Outcome: Progressing   Problem: Coping: Goal: Will verbalize positive feelings about self Outcome: Progressing Goal: Will identify appropriate support needs Outcome: Progressing   Problem: Health Behavior/Discharge Planning: Goal: Ability to manage health-related needs will improve Outcome: Progressing Goal: Goals will be collaboratively established with patient/family Outcome: Progressing   Problem: Self-Care: Goal: Ability to participate in self-care as condition permits will improve Outcome: Progressing Goal: Verbalization of feelings and concerns over difficulty with self-care will improve Outcome: Progressing Goal: Ability to communicate needs accurately will improve Outcome: Progressing   Problem: Nutrition: Goal: Risk of aspiration will decrease Outcome: Progressing Goal: Dietary intake will improve Outcome: Progressing

## 2022-12-03 NOTE — Progress Notes (Signed)
Occupational Therapy Session Note  Patient Details  Name: [name] MRN: [id number] Date of Birth: 26-Nov-1945  Today's Date: 12/03/2022 OT Individual Time: 1033-1105 OT Individual Time Calculation (min): 32 min    Short Term Goals: Week 3:  OT Short Term Goal 1 (Week 3): Pt will be able use his R arm to correct his postition to midline with CGA and min cues when he is leaning to the left in the bed. OT Short Term Goal 2 (Week 3): Pt will demonstrate improved ability to follow directions to don shirt with mod A and mod cues. OT Short Term Goal 3 (Week 3): Pt will maintain static sitting with max A of 1.  Skilled Therapeutic Interventions/Progress Updates:  Pt greeted supine in bed, pt agreeable to OT intervention. Donned pants from bed level with MAX A with pt assisting with rolling R<>L to pull pants to waist line. Pt with slight improvements in initiating volitional movements during bed mobility. Pt completed supine>sit with MAXA needing step by step cues to sequence task. Once EOB pt did best maintaining static sitting balance with pt cued to reach RUE to arm rest on chair positioned to the right and slightly in front of pt. Pt donned shirt from EOB with pt able to sit EOB during UB dressing with moments of MODA as pt does better when RUE given functional task so that pt doesn't begin to push with RUE.   Utilized stedy for OOB transfer. Pt able to stand from EOM with MAX A +2, improvements noted with shifting weight forward this session, dependent transfer into w/c with stedy. Ended session with pt seated in w/c with alarm belt activated and pt sitting on lift pad for back to bed transfer with nursing, nurse aware of maximove transfer back to bed.   Did leave estim on pts L wrist flexors at the below settings: Ratio 1:3 Rate 35 pps Waveform- Asymmetric Ramp 1.0 Pulse 300 Intensity- 30   Duration -   15  Returned at 12 pm to remove estim with no pain or adverse reactions noted.     Therapy Documentation Precautions:  Precautions Precautions: Fall Precaution Comments: dense L hemi with inability to initiate motor control with R hemibody, easily distracted Restrictions Weight Bearing Restrictions: No  Pain: no pain reported during session     Therapy/Group: Individual Therapy  Pollyann Glen [facility] 12/03/2022, 12:16 PM

## 2022-12-03 NOTE — Progress Notes (Signed)
PROGRESS NOTE   Subjective/Complaints:  No acute events overnight noted.  Reports he does not like Lovenox injections.  No additional concerns.  ROS: Limited due to cognitive/behavioral      Objective:   No results found. No results for input(s): "WBC", "HGB", "HCT", "PLT" in the last 72 hours.    No results for input(s): "NA", "K", "CL", "CO2", "GLUCOSE", "BUN", "CREATININE", "CALCIUM" in the last 72 hours.     Intake/Output Summary (Last 24 hours) at 12/03/2022 1118 Last data filed at 12/03/2022 0823 Gross per 24 hour  Intake 816 ml  Output 0 ml  Net 816 ml        Physical Exam: Vital Signs Blood pressure (!) 166/68, pulse 85, temperature 98.2 F (36.8 C), temperature source Oral, resp. rate 18, height 5\' 6"  (1.676 m), SpO2 97%.   Constitutional: No distress . Vital signs reviewed. HEENT: NCAT, EOMI, oral membranes moist Neck: supple Cardiovascular: RRR without murmur. No JVD    Respiratory/Chest: CTA Bilaterally without wheezes or rales. Normal effort    GI/Abdomen: BS +, non-tender, non-distended Ext: no clubbing, cyanosis, or edema Psych: flat but cooperative  Skin: rash nearly resolved Neurologic: alert, delayed processing and engagement.  oriented to person , place and situation , can respond to basic questions with extra time and repetition--- often answers with thumbs up to questions + left hemi-neglect is improving. MMT: 4/5 RUE and RLE. Grossly 0-tr/5 left deltoid, bicep, tricep, grip, hip flexor, knee extensors, ankle dorsiflexor and plantar flexors.   Sensory exam normal sensation to LT in bilateral upper and lower extremities Musculoskeletal: Full range of motion in all 4 extremities. No joint swelling   Assessment/Plan: 1. Functional deficits which require 3+ hours per day of interdisciplinary therapy in a comprehensive inpatient rehab setting. Physiatrist is providing close team supervision  and 24 hour management of active medical problems listed below. Physiatrist and rehab team continue to assess barriers to discharge/monitor patient progress toward functional and medical goals  Care Tool:  Bathing    Body parts bathed by patient: Chest, Abdomen, Face, Left arm, Right upper leg   Body parts bathed by helper: Right arm, Front perineal area, Buttocks, Left lower leg, Right upper leg     Bathing assist Assist Level: Moderate Assistance - Patient 50 - 74%     Upper Body Dressing/Undressing Upper body dressing   What is the patient wearing?: Pull over shirt    Upper body assist Assist Level: Maximal Assistance - Patient 25 - 49%    Lower Body Dressing/Undressing Lower body dressing      What is the patient wearing?: Incontinence brief, Pants     Lower body assist Assist for lower body dressing: 2 Helpers     Toileting Toileting    Toileting assist Assist for toileting: Dependent - Patient 0%     Transfers Chair/bed transfer  Transfers assist  Chair/bed transfer activity did not occur: Safety/medical concerns  Chair/bed transfer assist level: Dependent - mechanical lift     Locomotion Ambulation   Ambulation assist   Ambulation activity did not occur: Safety/medical concerns          Walk 10 feet activity  Assist  Walk 10 feet activity did not occur: Safety/medical concerns        Walk 50 feet activity   Assist Walk 50 feet with 2 turns activity did not occur: Safety/medical concerns         Walk 150 feet activity   Assist Walk 150 feet activity did not occur: Safety/medical concerns         Walk 10 feet on uneven surface  activity   Assist Walk 10 feet on uneven surfaces activity did not occur: Safety/medical concerns         Wheelchair     Assist Is the patient using a wheelchair?: Yes Type of Wheelchair: Manual Wheelchair activity did not occur: Safety/medical concerns         Wheelchair 50 feet  with 2 turns activity    Assist    Wheelchair 50 feet with 2 turns activity did not occur: Safety/medical concerns       Wheelchair 150 feet activity     Assist  Wheelchair 150 feet activity did not occur: Safety/medical concerns       Blood pressure (!) 166/68, pulse 85, temperature 98.2 F (36.8 C), temperature source Oral, resp. rate 18, height 5\' 6"  (1.676 m), SpO2 97%.  Medical Problem List and Plan: 1. 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  Antithrombotics: -DVT/anticoagulation:  Pharmaceutical:             left tib and peroneal DVTs, started on Eliquis, pt asymptomatic ,Repeat doppler showing resolution of calf DVT cont Lovenox - no f/u doppler needed             -antiplatelet therapy: Aspirin and Plavix for three weeks followed by Plavix alone.  Last dose of aspirin will be 7/29   -7/23 resumed plavix without any problems   -7/27 discussed benefits of Lovenox, will continue and patient agreeable 3. Pain Management: Tylenol as needed   4. Mood/Behavior/Sleep: LCSW to evaluate and provide emotional support             -antipsychotic agents: n/a Add low dose ritalin for initiation issues---7/25 increase to 10mg     5. Neuropsych/cognition: This patient is not capable of making decisions on his own behalf.             -pt has hx of ETOH abuse, 2-3 beers + wine most nights 6. Skin/Wound Care -maculopapular rash--likely d/t b12  -rash resolved with steroid taper and discontinuance of B12 supp    7. Fluids/Electrolytes/Nutrition: Routine Is and Os and follow-up chemistries Monday             -continue multivit, thiamine. He's eating well  -B12 17/2  was 204; discontinue p.o., can address as outpatient   8: Hypertension: monitor TID and prn (home meds: lisinopril, Lopressor)               Vitals:   12/03/22 0410 12/03/22 0942  BP: (!) 159/47 (!) 166/68  Pulse: 77 85  Resp: 18  Temp: 98.2 F (36.8 C)   SpO2: 97%     --persistent systolic elevation  -7/25 pt on amlodipine 10mg , resume lisinopril at 5mg  qd              7/26--obsv bp today with lisinopril on board  7/27 Increase lisinopril to 10mg  for elevated BP 9: Hyperlipidemia: continue statin   10: Prediabetes: ? diet controlled; A1c = 5.7%   11: Macrocytic anemia/B12 deficiency: follow-up CBC- Hgb normal /improved 7/15             -hx of pernicious anemia; PCP rec: daily B12  -recheck monday    Latest Ref Rng & Units 11/28/2022    7:42 AM 11/21/2022    6:27 AM 11/15/2022    5:46 AM  CBC  WBC 4.0 - 10.5 K/uL 8.1  9.7  7.7   Hemoglobin 13.0 - 17.0 g/dL 09.8  11.9  14.7   Hematocrit 39.0 - 52.0 % 40.0  40.6  35.9   Platelets 150 - 400 K/uL 272  283  214     12. Ileus: Resolved 7/16             -moving bowels now. Last BM 7/15             -senna-s at bedtime             -PPI, prn simethicone  -7/27 LBM yesterday, improved, continue to monitor 13. 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 Thyroid nodule-TFT's essentially WNL             -f/u as outpt, thyroid U/S, etc.     LOS: 19 days A FACE TO FACE EVALUATION WAS PERFORMED  Fanny Dance 12/03/2022, 11:18 AM

## 2022-12-04 NOTE — Progress Notes (Signed)
PROGRESS NOTE   Subjective/Complaints:  No acute events, no new concerns elicited.   ROS: Limited due to cognitive/behavioral      Objective:   No results found. No results for input(s): "WBC", "HGB", "HCT", "PLT" in the last 72 hours.    No results for input(s): "NA", "K", "CL", "CO2", "GLUCOSE", "BUN", "CREATININE", "CALCIUM" in the last 72 hours.     Intake/Output Summary (Last 24 hours) at 12/04/2022 1040 Last data filed at 12/03/2022 1803 Gross per 24 hour  Intake 476 ml  Output --  Net 476 ml        Physical Exam: Vital Signs Blood pressure (!) 159/57, pulse 88, temperature 98.4 F (36.9 C), temperature source Oral, resp. rate 20, height 5\' 6"  (1.676 m), SpO2 97%.   Constitutional: No distress . Vital signs reviewed. HEENT: NCAT, EOMI, oral membranes moist Neck: supple Cardiovascular: RRR without murmur. No JVD    Respiratory/Chest: CTA Bilaterally Normal effort    GI/Abdomen: BS +, non-tender, non-distended Ext: no clubbing, cyanosis, or edema Psych: flat but cooperative  Skin: warm and dry Neurologic: alert, delayed processing and engagement.,  limited verbal output, can respond to basic questions with extra time and repetition--- often answers with thumbs up to questions + left hemi-neglect is improving. MMT: 4/5 RUE and RLE. Grossly 0-tr/5 left deltoid, bicep, tricep, grip, hip flexor, knee extensors, ankle dorsiflexor and plantar flexors.   Sensory exam normal sensation to LT in bilateral upper and lower extremities Musculoskeletal: Full range of motion in all 4 extremities. No joint swelling   Assessment/Plan: 1. Functional deficits which require 3+ hours per day of interdisciplinary therapy in a comprehensive inpatient rehab setting. Physiatrist is providing close team supervision and 24 hour management of active medical problems listed below. Physiatrist and rehab team continue to assess  barriers to discharge/monitor patient progress toward functional and medical goals  Care Tool:  Bathing    Body parts bathed by patient: Chest, Abdomen, Face, Left arm, Right upper leg   Body parts bathed by helper: Right arm, Front perineal area, Buttocks, Left lower leg, Right upper leg     Bathing assist Assist Level: Moderate Assistance - Patient 50 - 74%     Upper Body Dressing/Undressing Upper body dressing   What is the patient wearing?: Pull over shirt    Upper body assist Assist Level: Maximal Assistance - Patient 25 - 49%    Lower Body Dressing/Undressing Lower body dressing      What is the patient wearing?: Incontinence brief, Pants     Lower body assist Assist for lower body dressing: 2 Helpers     Toileting Toileting    Toileting assist Assist for toileting: Dependent - Patient 0%     Transfers Chair/bed transfer  Transfers assist  Chair/bed transfer activity did not occur: Safety/medical concerns  Chair/bed transfer assist level: Dependent - mechanical lift     Locomotion Ambulation   Ambulation assist   Ambulation activity did not occur: Safety/medical concerns          Walk 10 feet activity   Assist  Walk 10 feet activity did not occur: Safety/medical concerns        Walk  50 feet activity   Assist Walk 50 feet with 2 turns activity did not occur: Safety/medical concerns         Walk 150 feet activity   Assist Walk 150 feet activity did not occur: Safety/medical concerns         Walk 10 feet on uneven surface  activity   Assist Walk 10 feet on uneven surfaces activity did not occur: Safety/medical concerns         Wheelchair     Assist Is the patient using a wheelchair?: Yes Type of Wheelchair: Manual Wheelchair activity did not occur: Safety/medical concerns         Wheelchair 50 feet with 2 turns activity    Assist    Wheelchair 50 feet with 2 turns activity did not occur:  Safety/medical concerns       Wheelchair 150 feet activity     Assist  Wheelchair 150 feet activity did not occur: Safety/medical concerns       Blood pressure (!) 159/57, pulse 88, temperature 98.4 F (36.9 C), temperature source Oral, resp. rate 20, height 5\' 6"  (1.676 m), SpO2 97%.  Medical Problem List and Plan: 1. 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  Antithrombotics: -DVT/anticoagulation:  Pharmaceutical:             left tib and peroneal DVTs, started on Eliquis, pt asymptomatic ,Repeat doppler showing resolution of calf DVT cont Lovenox - no f/u doppler needed             -antiplatelet therapy: Aspirin and Plavix for three weeks followed by Plavix alone.  Last dose of aspirin will be 7/29   -7/23 resumed plavix without any problems   -7/27 discussed benefits of Lovenox, will continue and patient agreeable 3. Pain Management: Tylenol as needed   4. Mood/Behavior/Sleep: LCSW to evaluate and provide emotional support             -antipsychotic agents: n/a Add low dose ritalin for initiation issues---7/25 increase to 10mg     5. Neuropsych/cognition: This patient is not capable of making decisions on his own behalf.             -pt has hx of ETOH abuse, 2-3 beers + wine most nights 6. Skin/Wound Care -maculopapular rash--likely d/t b12  -rash resolved with steroid taper and discontinuance of B12 supp    7. Fluids/Electrolytes/Nutrition: Routine Is and Os and follow-up chemistries Monday             -continue multivit, thiamine. He's eating well  -B12 17/2 was 204; discontinue p.o., can address as outpatient   8: Hypertension: monitor TID and prn (home  meds: lisinopril, Lopressor)               Vitals:   12/04/22 0402 12/04/22 0931  BP: (!) 146/50 (!) 159/57  Pulse: 79 88  Resp: 17 20  Temp: 97.7 F (36.5 C) 98.4 F (36.9 C)  SpO2: 94% 97%    --persistent systolic elevation  -  7/25 pt on amlodipine 10mg , resume lisinopril at 5mg  qd              7/26--obsv bp today with lisinopril on board  7/27 Increase lisinopril to 10mg  for elevated BP  7/28 Monitor response to medication change 9: Hyperlipidemia: continue statin   10: Prediabetes: ? diet controlled; A1c = 5.7%   11: Macrocytic anemia/B12 deficiency: follow-up CBC- Hgb normal /improved 7/15             -hx of pernicious anemia; PCP rec: daily B12  -recheck tomorrow    Latest Ref Rng & Units 11/28/2022    7:42 AM 11/21/2022    6:27 AM 11/15/2022    5:46 AM  CBC  WBC 4.0 - 10.5 K/uL 8.1  9.7  7.7   Hemoglobin 13.0 - 17.0 g/dL 96.2  95.2  84.1   Hematocrit 39.0 - 52.0 % 40.0  40.6  35.9   Platelets 150 - 400 K/uL 272  283  214     12. Ileus: Resolved 7/16             -moving bowels now. Last BM 7/15             -senna-s at bedtime             -PPI, prn simethicone  -7/28 LBM yesterday, improved, continue to monitor 13. 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 Thyroid nodule-TFT's essentially WNL             -f/u as outpt, thyroid U/S, etc.     LOS: 20 days A FACE TO FACE EVALUATION WAS PERFORMED  Fanny Dance 12/04/2022, 10:40 AM

## 2022-12-04 NOTE — Plan of Care (Signed)
  Problem: Consults Goal: RH STROKE PATIENT EDUCATION Description: See Patient Education module for education specifics  Outcome: Progressing   Problem: RH BOWEL ELIMINATION Goal: RH STG MANAGE BOWEL WITH ASSISTANCE Description: STG Manage Bowel with min Assistance. Outcome: Progressing Goal: RH STG MANAGE BOWEL W/MEDICATION W/ASSISTANCE Description: STG Manage Bowel with Medication with mod I Assistance. Outcome: Progressing   Problem: RH BLADDER ELIMINATION Goal: RH STG MANAGE BLADDER WITH ASSISTANCE Description: STG Manage Bladder With min Assistance Outcome: Progressing   Problem: RH SAFETY Goal: RH STG ADHERE TO SAFETY PRECAUTIONS W/ASSISTANCE/DEVICE Description: STG Adhere to Safety Precautions With cues Assistance/Device. Outcome: Progressing   Problem: RH COGNITION-NURSING Goal: RH STG USES MEMORY AIDS/STRATEGIES W/ASSIST TO PROBLEM SOLVE Description: STG Uses Memory Aids/Strategies With cues Assistance to Problem Solve. Outcome: Progressing   Problem: RH KNOWLEDGE DEFICIT Goal: RH STG INCREASE KNOWLEDGE OF HYPERTENSION Description: Patient and spouse will be able to manage HTN with medications and dietary modifications using educational resources independendtly Outcome: Progressing Goal: RH STG INCREASE KNOWLEGDE OF HYPERLIPIDEMIA Description: Patient and spouse will be able to manage HLD with medications and dietary modifications using educational resources independendtly Outcome: Progressing Goal: RH STG INCREASE KNOWLEDGE OF STROKE PROPHYLAXIS Description: Patient and spouse will be able to manage secondary risks with medications and dietary modifications using educational resources independendtly Outcome: Progressing   Problem: Education: Goal: Knowledge of disease or condition will improve Outcome: Progressing Goal: Knowledge of secondary prevention will improve (MUST DOCUMENT ALL) Outcome: Progressing Goal: Knowledge of patient specific risk factors will  improve Loraine Leriche N/A or DELETE if not current risk factor) Outcome: Progressing   Problem: Ischemic Stroke/TIA Tissue Perfusion: Goal: Complications of ischemic stroke/TIA will be minimized Outcome: Progressing   Problem: Coping: Goal: Will verbalize positive feelings about self Outcome: Progressing Goal: Will identify appropriate support needs Outcome: Progressing   Problem: Health Behavior/Discharge Planning: Goal: Ability to manage health-related needs will improve Outcome: Progressing Goal: Goals will be collaboratively established with patient/family Outcome: Progressing   Problem: Self-Care: Goal: Ability to participate in self-care as condition permits will improve Outcome: Progressing Goal: Verbalization of feelings and concerns over difficulty with self-care will improve Outcome: Progressing Goal: Ability to communicate needs accurately will improve Outcome: Progressing   Problem: Nutrition: Goal: Risk of aspiration will decrease Outcome: Progressing Goal: Dietary intake will improve Outcome: Progressing

## 2022-12-05 NOTE — [place] FL2 (Signed)
[place] MEDICAID FL2 LEVEL OF CARE FORM     IDENTIFICATION  Patient Name: Samuel Pearson Birthdate: 12/03/1945 Sex: male Admission Date (Current Location): 11/14/2022  [facility] and [place] Number:  [place] N/A Facility and Address:  [facility], [address], [place], [place] [address]      Provider Number:    Attending Physician Name and Address:  Erick Colace, MD  Relative Name and Phone Number:  Usbaldo Ishaq [phone]    Current Level of Care: Hospital Recommended Level of Care: Skilled Nursing Facility Prior Approval Number:    Date Approved/Denied:   PASRR Number: [id number]  Discharge Plan: SNF    Current Diagnoses: Patient Active Problem List   Diagnosis Date Noted   CVA (cerebral vascular accident) (HCC) 11/08/2022   Macrocytic anemia 11/08/2022   Hypocalcemia 11/08/2022   Thrombocytopenia (HCC) 11/08/2022   Alcohol use 11/08/2022   Spinal stenosis 11/08/2022   Contact dermatitis due to chemicals 03/17/2022   Aortic valve regurgitation 01/18/2022   Periorbital dermatitis 12/13/2021   Food intolerance 12/13/2021   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   Acute cholecystitis 02/25/2020   Leukocytosis 02/25/2020   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/24/2020   Essential hypertension 05/17/2014   Low HDL (under 40) 05/17/2014   Fasting hyperglycemia 05/17/2014   Colon cancer screening 03/18/2013   HTN, white coat 03/18/2013   Medication management 12/03/2011   Heart murmur 12/03/2011   Dermatochalasis of eyelid 09/14/2011   Meibomian gland dysfunction 09/14/2011   Posterior vitreous detachment 09/14/2011   Postsurgical retinal scar 09/14/2011   Pseudophakia 09/14/2011   Retinal tear 09/14/2011   Visit for preventive health examination 08/29/2010   Other and unspecified hyperlipidemia 08/29/2010   Abnormal RBC 07/28/2010   Screening PSA (prostate specific antigen) 07/28/2010   HYPERTENSION 01/21/2008   Other allergic  rhinitis 01/21/2008   HYPERGLYCEMIA 01/21/2008    Orientation RESPIRATION BLADDER Height & Weight     Self, Situation, Place, Time  Normal Incontinent Weight:   Height:  5\' 6"  (167.6 cm)  BEHAVIORAL SYMPTOMS/MOOD NEUROLOGICAL BOWEL NUTRITION STATUS      Incontinent Diet  AMBULATORY STATUS COMMUNICATION OF NEEDS Skin   Extensive Assist Verbally Normal (Rash on upper body and groin are nearly healed.)                       Personal Care Assistance Level of Assistance  Bathing, Feeding, Dressing Bathing Assistance: Maximum assistance Feeding assistance: Maximum assistance Dressing Assistance: Maximum assistance     Functional Limitations Info             SPECIAL CARE FACTORS FREQUENCY  PT (By licensed PT), OT (By licensed OT), Speech therapy     PT Frequency: 5x a week OT Frequency: 5x a week     Speech Therapy Frequency: 5x a week      Contractures      Additional Factors Info  Code Status Code Status Info: FULL             Current Medications (12/05/2022):  This is the current hospital active medication list Current Facility-Administered Medications  Medication Dose Route Frequency Provider Last Rate Last Admin   acetaminophen (TYLENOL) tablet 325-650 mg  325-650 mg Oral Q4H PRN Setzer, Lynnell Jude, PA-C       alum & mag hydroxide-simeth (MAALOX/MYLANTA) 200-200-20 MG/5ML suspension 30 mL  30 mL Oral Q4H PRN Setzer, Lynnell Jude, PA-C       amLODipine (NORVASC) tablet 10 mg  10 mg Oral Daily Elijah Birk C, DO   10 mg at 12/05/22 8657   aspirin EC tablet 81 mg  81 mg Oral Daily Erick Colace, MD   81 mg at 12/05/22 0759   atorvastatin (LIPITOR) tablet 40 mg  40 mg Oral Daily Milinda Antis, PA-C   40 mg at 12/05/22 0759   bisacodyl (DULCOLAX) EC tablet 5 mg  5 mg Oral Daily PRN Milinda Antis, PA-C       clopidogrel (PLAVIX) tablet 75 mg  75 mg Oral Daily Ranelle Oyster, MD   75 mg at 12/05/22 0759   diphenhydrAMINE (BENADRYL) capsule 25 mg  25  mg Oral Q6H PRN Milinda Antis, PA-C   25 mg at 11/26/22 1924   diphenhydrAMINE-zinc acetate (BENADRYL) 2-0.1 % cream   Topical TID PRN Milinda Antis, PA-C   Given at 11/26/22 1929   enoxaparin (LOVENOX) injection 40 mg  40 mg Subcutaneous Q24H Milinda Antis, PA-C   40 mg at 12/04/22 2100   fluticasone (FLONASE) 50 MCG/ACT nasal spray 1 spray  1 spray Each Nare Daily Erick Colace, MD   1 spray at 12/05/22 0800   folic acid (FOLVITE) tablet 1 mg  1 mg Oral Daily Milinda Antis, PA-C   1 mg at 12/05/22 0759   guaiFENesin-dextromethorphan (ROBITUSSIN DM) 100-10 MG/5ML syrup 10 mL  10 mL Oral Q6H PRN Milinda Antis, PA-C       lisinopril (ZESTRIL) tablet 10 mg  10 mg Oral Daily Fanny Dance, MD   10 mg at 12/05/22 0759   methocarbamol (ROBAXIN) tablet 500 mg  500 mg Oral Q6H PRN Milinda Antis, PA-C       methylphenidate (RITALIN) tablet 10 mg  10 mg Oral BID WC Ranelle Oyster, MD   10 mg at 12/05/22 1123   multivitamin with minerals tablet 1 tablet  1 tablet Oral Daily Milinda Antis, PA-C   1 tablet at 12/05/22 0759   ondansetron (ZOFRAN) tablet 4 mg  4 mg Oral Q6H PRN Milinda Antis, PA-C       Or   ondansetron [facility]) injection 4 mg  4 mg Intravenous Q6H PRN Setzer, Lynnell Jude, PA-C       pantoprazole (PROTONIX) EC tablet 40 mg  40 mg Oral Daily Milinda Antis, PA-C   40 mg at 12/05/22 0759   polyethylene glycol (MIRALAX / GLYCOLAX) packet 17 g  17 g Oral Daily PRN Milinda Antis, PA-C       polyvinyl alcohol (LIQUIFILM TEARS) 1.4 % ophthalmic solution 1 drop  1 drop Both Eyes PRN Setzer, Lynnell Jude, PA-C       senna-docusate (Senokot-S) tablet 1 tablet  1 tablet Oral QHS Ranelle Oyster, MD   1 tablet at 12/04/22 2100   simethicone (MYLICON) chewable tablet 80 mg  80 mg Oral QID PRN Milinda Antis, PA-C       sodium phosphate (FLEET) 7-19 GM/118ML enema 1 enema  1 enema Rectal Once PRN Setzer, Lynnell Jude, PA-C       thiamine (VITAMIN B1) tablet 100 mg  100 mg  Oral Daily Milinda Antis, PA-C   100 mg at 12/05/22 0759   traZODone (DESYREL) tablet 50 mg  50 mg Oral QHS PRN Milinda Antis, PA-C         Discharge Medications: Please see discharge summary for a list of discharge medications.  Relevant Imaging Results:  Relevant Lab  Results:   Additional Information SS: [id number]  Andria Rhein

## 2022-12-05 NOTE — Progress Notes (Signed)
PROGRESS NOTE   Subjective/Complaints: Labs reviewed  No issues overnite Pt states he watched Olympics   ROS: Limited due to cognitive/behavioral      Objective:   No results found. Recent Labs    12/05/22 0611  WBC 7.4  HGB 13.7  HCT 40.5  PLT 260      Recent Labs    12/05/22 0611  NA 136  K 3.8  CL 105  CO2 24  GLUCOSE 120*  BUN 9  CREATININE 0.62  CALCIUM 8.5*       Intake/Output Summary (Last 24 hours) at 12/05/2022 0732 Last data filed at 12/04/2022 1759 Gross per 24 hour  Intake 476 ml  Output --  Net 476 ml        Physical Exam: Vital Signs Blood pressure (!) 151/53, pulse 83, temperature (!) 97.5 F (36.4 C), temperature source Oral, resp. rate 15, height 5\' 6"  (1.676 m), SpO2 94%.    General: No acute distress Mood and affect are appropriate Heart: Regular rate and rhythm no rubs murmurs or extra sounds Lungs: Clear to auscultation, breathing unlabored, no rales or wheezes Abdomen: Positive bowel sounds, soft nontender to palpation, nondistended Extremities: No clubbing, cyanosis, or edema Skin: No evidence of breakdown, no evidence of rash Neurologic: alert, delayed processing and engagement.,  limited verbal output, can respond to basic questions with extra time and repetition--- often answers with thumbs up to questions + left hemi-neglect is improving. MMT: 4/5 RUE and RLE. Grossly 0-tr/5 left deltoid, bicep, tricep, grip, hip flexor, knee extensors, ankle dorsiflexor and plantar flexors.   Sensory exam normal sensation to LT in bilateral upper and lower extremities Musculoskeletal: Full range of motion in all 4 extremities. No joint swelling   Assessment/Plan: 1. Functional deficits which require 3+ hours per day of interdisciplinary therapy in a comprehensive inpatient rehab setting. Physiatrist is providing close team supervision and 24 hour management of active medical  problems listed below. Physiatrist and rehab team continue to assess barriers to discharge/monitor patient progress toward functional and medical goals  Care Tool:  Bathing    Body parts bathed by patient: Chest, Abdomen, Face, Left arm, Right upper leg   Body parts bathed by helper: Right arm, Front perineal area, Buttocks, Left lower leg, Right upper leg     Bathing assist Assist Level: Moderate Assistance - Patient 50 - 74%     Upper Body Dressing/Undressing Upper body dressing   What is the patient wearing?: Pull over shirt    Upper body assist Assist Level: Maximal Assistance - Patient 25 - 49%    Lower Body Dressing/Undressing Lower body dressing      What is the patient wearing?: Incontinence brief, Pants     Lower body assist Assist for lower body dressing: 2 Helpers     Toileting Toileting    Toileting assist Assist for toileting: Dependent - Patient 0%     Transfers Chair/bed transfer  Transfers assist  Chair/bed transfer activity did not occur: Safety/medical concerns  Chair/bed transfer assist level: Dependent - mechanical lift     Locomotion Ambulation   Ambulation assist   Ambulation activity did not occur: Safety/medical concerns  Walk 10 feet activity   Assist  Walk 10 feet activity did not occur: Safety/medical concerns        Walk 50 feet activity   Assist Walk 50 feet with 2 turns activity did not occur: Safety/medical concerns         Walk 150 feet activity   Assist Walk 150 feet activity did not occur: Safety/medical concerns         Walk 10 feet on uneven surface  activity   Assist Walk 10 feet on uneven surfaces activity did not occur: Safety/medical concerns         Wheelchair     Assist Is the patient using a wheelchair?: Yes Type of Wheelchair: Manual Wheelchair activity did not occur: Safety/medical concerns         Wheelchair 50 feet with 2 turns activity    Assist     Wheelchair 50 feet with 2 turns activity did not occur: Safety/medical concerns       Wheelchair 150 feet activity     Assist  Wheelchair 150 feet activity did not occur: Safety/medical concerns       Blood pressure (!) 151/53, pulse 83, temperature (!) 97.5 F (36.4 C), temperature source Oral, resp. rate 15, height 5\' 6"  (1.676 m), SpO2 94%.  Medical Problem List and Plan: 1. 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  Antithrombotics: -DVT/anticoagulation:  Pharmaceutical:             left tib and peroneal DVTs, started on Eliquis, pt asymptomatic ,Repeat doppler showing resolution of calf DVT cont Lovenox - no f/u doppler needed             -antiplatelet therapy: Aspirin and Plavix for three weeks followed by Plavix alone.  Last dose of aspirin will be 7/29   -7/23 resumed plavix without any problems   -7/27 discussed benefits of Lovenox, will continue and patient agreeable 3. Pain Management: Tylenol as needed   4. Mood/Behavior/Sleep: LCSW to evaluate and provide emotional support             -antipsychotic agents: n/a Add low dose ritalin for initiation issues---7/25 increase to 10mg     5. Neuropsych/cognition: This patient is not capable of making decisions on his own behalf.             -pt has hx of ETOH abuse, 2-3 beers + wine most nights 6. Skin/Wound Care -maculopapular rash--likely d/t b12  -rash resolved with steroid taper and discontinuance of B12 supp    7. Fluid nutrition- meal intake variable 25-100%, po fluids ,    8: Hypertension: monitor TID and prn (home meds: lisinopril, Lopressor)               Vitals:   12/04/22 1854  12/05/22 0439  BP: (!) 152/61 (!) 151/53  Pulse: 93 83  Resp: 17 15  Temp: 97.7 F (36.5 C) (!) 97.5 F (36.4 C)  SpO2: 96% 94%    --persistent systolic elevation  -  7/25 pt on amlodipine 10mg , resume lisinopril at 5mg  qd              7/26--obsv bp today with lisinopril on board  7/27 Increase lisinopril to 10mg  for elevated BP  Restart low dose metoprolol 9: Hyperlipidemia: continue statin   10: Prediabetes: ? diet controlled; A1c = 5.7%   11: Macrocytic anemia/B12 deficiency: follow-up CBC- anemia resolved with supplementation              -hx of pernicious anemia; PCP rec: daily B12      Latest Ref Rng & Units 12/05/2022    6:11 AM 11/28/2022    7:42 AM 11/21/2022    6:27 AM  CBC  WBC 4.0 - 10.5 K/uL 7.4  8.1  9.7   Hemoglobin 13.0 - 17.0 g/dL 16.1  09.6  04.5   Hematocrit 39.0 - 52.0 % 40.5  40.0  40.6   Platelets 150 - 400 K/uL 260  272  283    12. 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 Thyroid nodule-TFT's essentially WNL             -f/u as outpt, thyroid U/S, etc.     LOS: 21 days A FACE TO FACE EVALUATION WAS PERFORMED  Erick Colace 12/05/2022, 7:32 AM

## 2022-12-05 NOTE — Progress Notes (Addendum)
Patient ID: Samuel Pearson, male   DOB: 08-09-1945, 77 y.o.   MRN: [id number]  Spouse has provided SNF preferences of  [facility] and [facility].   1:43 PM: Sw left VM with spouse requesting SS # for patient. SW will wait for FU.

## 2022-12-05 NOTE — Progress Notes (Signed)
Physical Therapy Session Note  Patient Details  Name: Samuel Pearson MRN: [id number] Date of Birth: [birth year]  Today's Date: 12/05/2022 PT Individual Time: 1535-1630 PT Individual Time Calculation (min): 55 min   Short Term Goals: Week 3:  PT Short Term Goal 1 (Week 3): Pt will perform all aspects of bed mobility with consistent MaxA. PT Short Term Goal 2 (Week 3): Pt will perform sit<>stand with MaxA +1 PT Short Term Goal 3 (Week 3): Pt will demo improved ability to initiate toward midline without need for external cues for reaching. Will initiate >20% of attempts. PT Short Term Goal 4 (Week 3): Pt will initiate sitting balance and maintain with MaxA+1. PT Short Term Goal 5 (Week 3): Pt will ambulate with MaxA +2 up to 20 steps.  Skilled Therapeutic Interventions/Progress Updates:     Pt received semi reclined in bed and agrees to therapy. No complaint of pain. Pt performs supine to sit with totalA +2. Pt has havy posterior and Lt sided lean upon sitting at Eob and PT provides max cues to reposition pt to attempt to limit pushing. PT has most success when having pt lean into side of bed with Rt forearm. Pt performs squat pivot to WC on Lt with maxA +2 and cues for initiation, anterior weight shifting, and sequencing. WC transport to gym. Pt stands at parallel bars with mirror for visual feedback and both hands on bar for upper extremity support. Pt requires maxA +2 for static stancing and demonstrates heavy Lt lean throughout.   Pt transfers to mat with maxA +2 and squat pivot technique. Remainder of session focused on NMR for sitting balance and midline orientation. Pt has success with cue to cross RLE over Lt with PT providing manual facilitation to stabilize RLE and prevent it from returning to baseline pushing to the Lt. Pt then performs Rt sideleaning on forearm transitioning to upright sitting and performing targeted reaching with Rt forefinger and PT providing targets anterior to pt's  COG. Pt eventually able to come to static sitting with minA and consistent cues to keep pt on task, as he becomes distracted easily and resumes pushing with RUE and RLE.   Pt performs squat pivot from mat>WC>bed with maxA+2. Sit to supine with maxA +2 and cues for sequencing. Left semi reclined with all needs within reach.   Therapy Documentation Precautions:  Precautions Precautions: Fall Precaution Comments: dense L hemi with inability to initiate motor control with R hemibody, easily distracted Restrictions Weight Bearing Restrictions: No   Therapy/Group: Individual Therapy  Beau Fanny, PT, DPT 12/05/2022, 4:46 PM

## 2022-12-05 NOTE — Progress Notes (Signed)
Patient ID: Samuel Pearson, male   DOB: Mar 01, 1946, 77 y.o.   MRN: [id number]  SW left VM with spouse, Aurea Graff to discuss SNF preferences.

## 2022-12-05 NOTE — Progress Notes (Signed)
Speech Language Pathology Daily Session Note  Patient Details  Name: Samuel Pearson MRN: [id number] Date of Birth: 03/21/1946  Today's Date: 12/05/2022 SLP Individual Time: 1445-1530 SLP Individual Time Calculation (min): 45 min  Short Term Goals: Week 3: SLP Short Term Goal 1 (Week 3): Patient will demonstrate orientation to time with min multimodal cues SLP Short Term Goal 2 (Week 3): Patient will answer mildly complex problem solving questions with supervision multimodal A SLP Short Term Goal 3 (Week 3): Patient will utilize memory compensatory strategies with supervision multimodal A SLP Short Term Goal 4 (Week 3): Patient will demonstrate selective/sutained attention for >20 minutes with min multimodal A SLP Short Term Goal 5 (Week 3): Patient will initiate verbal responses in 75% of opportunities with supervision verbal cues SLP Short Term Goal 6 (Week 3): Patient will demonstrate intellectual awareness by verbalizing two physical and two cognitive impairments min multimodal A  Skilled Therapeutic Interventions:   Pt and wife greeted at bedside. SLP facilitated a variety of tx tasks targeting cognition. Throughout structured tx tasks and conversation, he benefited from [facility] visual/verbal cues to initiate responses. During verbal problem solving task, he was able to ID potential solutions to household situations w/ minA verbal cues for reasoning. He benefited from supervision visual/verbal cues to utilize his calendar and answer orientation questions. Additionally, he benefited from [facility] visual/verbal cues for L visual attention to ID today's events on the olympic calendar. He benefited from minA verbal cues for sustained attention during verbal problem solving task. After ~20 mins, he unmuted the TV and returned to his program x1 between prompts. He was responsive to redirections/cues throughout tx tasks. Pt left in bed with alarm set and call light within reach. Recommend cont ST per POC.    Pain  No pain reported  Therapy/Group: Individual Therapy  Pati Gallo 12/05/2022, 3:39 PM

## 2022-12-05 NOTE — Progress Notes (Signed)
Occupational Therapy Session Note  Patient Details  Name: Samuel Pearson MRN: [id number] Date of Birth: 07/19/1945  Today's Date: 12/05/2022 OT Individual Time: 1125-1205 OT Individual Time Calculation (min): 40 min    Short Term Goals: Week 3:  OT Short Term Goal 1 (Week 3): Pt will be able use his R arm to correct his postition to midline with CGA and min cues when he is leaning to the left in the bed. OT Short Term Goal 2 (Week 3): Pt will demonstrate improved ability to follow directions to don shirt with mod A and mod cues. OT Short Term Goal 3 (Week 3): Pt will maintain static sitting with max A of 1.  Skilled Therapeutic Interventions/Progress Updates:  Pt greeted seated in w/c, NT reports pt is soiled. Assisted pt back to bed for pericare using the stedy with an emphasis on initiating active movement with RUE. Pt did stand better from EOB with +2 MAX A and initiated head to pillow quicker this session when cued " put your ear on the pillow." Pt required total A for all aspects of pericare and brief change. Pts rolling is improving, able to roll to L side with RUE with MODA.  Once back in w/c worked on active reaching to R side with pt instructed to slide wash cloth up a wedge on R side to facilitate improved anterior weight shifting to be carried over functionally during transfers. Pt needed assist to shift into R and anterior lean but able to sustain movement for < 30 secs x2 reps.  Ended session with pt seated in w/c with alarm belt activated and all needs within reach.   Therapy Documentation Precautions:  Precautions Precautions: Fall Precaution Comments: dense L hemi with inability to initiate motor control with R hemibody, easily distracted Restrictions Weight Bearing Restrictions: No    Pain: No pain    Therapy/Group: Individual Therapy  Pollyann Glen [facility] 12/05/2022, 12:27 PM

## 2022-12-06 ENCOUNTER — Inpatient Hospital Stay (HOSPITAL_COMMUNITY): Payer: Medicare Other

## 2022-12-06 NOTE — Progress Notes (Addendum)
Patient ID: Samuel Pearson, male   DOB: Feb 10, 1946, 77 y.o.   MRN: [id number]   SW received call from [facility]. Patient [facility] will be approved once family makes determination of facility. Sw will call back tomorrow to add facility to pt's authorization.   SW will discuss with spouse.  Sw left a VM with Josh at [employer].    2:19 PM:  SW spoke with spouse to informed her of the [place] for SNF approval. Sw informed spouse that pt and family will need to determine the preference of [facility] or [facility] to be listed on the authorization by tomorrow. Sw will wait for follow up from spouse and reach back out to insurance tomorrow with a facility to list on the authorization. Once a facility is listed, SW will received an [place] number.  No additional questions or concerns.

## 2022-12-06 NOTE — Progress Notes (Signed)
Physical Therapy Session Note  Patient Details  Name: Samuel Pearson MRN: [id number] Date of Birth: Feb 06, 1946  Today's Date: 12/05/2022 PT Individual Time: 0805-0902 PT Individual Time Calculation (min): 57 min   Short Term Goals: Week 2:  PT Short Term Goal 1 (Week 2): Pt will perform all aspects of bed mobility with consistent MaxA. PT Short Term Goal 1 - Progress (Week 2): Progressing toward goal PT Short Term Goal 2 (Week 2): Pt will perform sit<>stand with MaxA +1 PT Short Term Goal 2 - Progress (Week 2): Not met PT Short Term Goal 3 (Week 2): Pt will perform seat to seat transfers with MaxA +1 PT Short Term Goal 3 - Progress (Week 2): Not met PT Short Term Goal 4 (Week 2): Pt will initiate sitting balance and maintain with MaxA+1. PT Short Term Goal 4 - Progress (Week 2): Not met PT Short Term Goal 5 (Week 2): Pt will initiate gait training. PT Short Term Goal 5 - Progress (Week 2): Met Week 3:  PT Short Term Goal 1 (Week 3): Pt will perform all aspects of bed mobility with consistent MaxA. PT Short Term Goal 2 (Week 3): Pt will perform sit<>stand with MaxA +1 PT Short Term Goal 3 (Week 3): Pt will demo improved ability to initiate toward midline without need for external cues for reaching. Will initiate >20% of attempts. PT Short Term Goal 4 (Week 3): Pt will initiate sitting balance and maintain with MaxA+1. PT Short Term Goal 5 (Week 3): Pt will ambulate with MaxA +2 up to 20 steps.   Skilled Therapeutic Interventions/Progress Updates:  Patient supine in bed on entrance to room. Patient alert and agreeable to PT session. Is listing to R side and with vc is unable to self correct to midline.   Patient with no pain complaint at start of session.  Therapeutic Activity: Bed Mobility: Pt performed dressing while in supine and requiring MaxA to don pants. Pt relates "dribbling" into brief. Brief change initiated and requires TotA for turning, brief change, cleaning/ pericare.   Partial bridge performed following several vc to initiate LB dressing from held hooklying position. Minimal lift of bottom demonstrated. Performed supine <> sit with initial log roll to R side and vc from NT. Corrected both NT and pt for technique to involve pt more. Donned socks and shoes with Max/ TotA.   Transfers: Once shirt donned, STEDY positioned in front of pt. VC/ tc required for assist from pt who continues to push RUE and RLE into extension while sitting. Requires MaxA +3 to complete into STEDY and requires Max/ TotA for maintaining balance despite cues for pull vs push into midline orientation.   Wheelchair Mobility:  Once in w/c pt instructed to propel w/c to end of nurse's station. He is able to maintain balance in w/c with prop of L arm on half lap tray and R hand propelling w/c. Pt propelled wheelchair 100 feet with overall supervision and extensive vc for technique, steering, observing and maneuvering obstacles, and near 100% cues required for FOCUS on task.   Patient seated upright in w/c at end of session with brakes locked, belt alarm set, and all needs within reach.   Therapy Documentation Precautions:  Precautions Precautions: Fall Precaution Comments: dense L hemi with inability to initiate motor control with R hemibody, easily distracted Restrictions Weight Bearing Restrictions: No  Pain:  No pain related from pt this session.   Therapy/Group: Individual Therapy  [name] 12/06/2022, 12:52 AM

## 2022-12-06 NOTE — Progress Notes (Signed)
Physical Therapy Session Note  Patient Details  Name: Samuel Pearson MRN: [id number] Date of Birth: Oct 24, 1945  Today's Date: 12/06/2022 PT Individual Time: 1500-1510 PT Individual Time Calculation (min): 10 min   Short Term Goals: Week 3:  PT Short Term Goal 1 (Week 3): Pt will perform all aspects of bed mobility with consistent MaxA. PT Short Term Goal 2 (Week 3): Pt will perform sit<>stand with MaxA +1 PT Short Term Goal 3 (Week 3): Pt will demo improved ability to initiate toward midline without need for external cues for reaching. Will initiate >20% of attempts. PT Short Term Goal 4 (Week 3): Pt will initiate sitting balance and maintain with MaxA+1. PT Short Term Goal 5 (Week 3): Pt will ambulate with MaxA +2 up to 20 steps.  Skilled Therapeutic Interventions/Progress Updates:    Handoff from OT in therapy gym after +2 assist slideboard transfer and repositioning back into the w/c. Focused on w/c propulsion in hallway with RLE and focusing in attention to task and awareness of surroundings. Pt easily distracted in non busy hallway, increased distraction once environment busier. Pt able to propel w/c about 55' but with several starts and stops and verbal and tactile redirection to maintain attention to the task and for attention to L side of hallway and obstacle negotiation (wall and rail on the left only, no items in his direct path). Set up  in w/c with L armtray donned, safety belt in tact, and call bell in reach.   Therapy Documentation Precautions:  Precautions Precautions: Fall Precaution Comments: dense L hemi with inability to initiate motor control with R hemibody, easily distracted Restrictions Weight Bearing Restrictions: No    Pain:  No complaints of pain.     Therapy/Group: Individual Therapy  Karolee Stamps Darrol Poke, PT, DPT, CBIS  12/06/2022, 3:24 PM

## 2022-12-06 NOTE — Progress Notes (Addendum)
Patient ID: Samuel Pearson, male   DOB: [birth year], 77 y.o.   MRN: [id number]  SW spoke with spouse to discuss SW reaching out to insurance for authorization today.  Spouse has received bed offer with [facility] and Sw will reach out to [facility] to discuss determination.   9:52 AM: VM left for admission AD at [facility] to review patient referral. SW will wait for FU.

## 2022-12-06 NOTE — Progress Notes (Signed)
Physical Therapy Session Note  Patient Details  Name: JOSHIAH SCHAAL MRN: [id number] Date of Birth: Dec 22, 1945  {CHL IP REHAB PT TIME CALCULATION:304800500}  Short Term Goals: {HYQ:6578469}  Skilled Therapeutic Interventions/Progress Updates:      Therapy Documentation Precautions:  Precautions Precautions: Fall Precaution Comments: dense L hemi with inability to initiate motor control with R hemibody, easily distracted Restrictions Weight Bearing Restrictions: No General:   Vital Signs:   Pain:   Mobility:   Locomotion :    Trunk/Postural Assessment :    Balance:   Exercises:   Other Treatments:      Therapy/Group: {Therapy/Group:[id number]}  [name] 12/06/2022, 5:08 PM

## 2022-12-06 NOTE — Progress Notes (Signed)
PROGRESS NOTE   Subjective/Complaints:  "A lot of gas" per RN has had freq small stools type 6 incont , mild nausea no vomiting  ROS: Limited due to cognitive/behavioral      Objective:   No results found. Recent Labs    12/05/22 0611  WBC 7.4  HGB 13.7  HCT 40.5  PLT 260      Recent Labs    12/05/22 0611  NA 136  K 3.8  CL 105  CO2 24  GLUCOSE 120*  BUN 9  CREATININE 0.62  CALCIUM 8.5*       Intake/Output Summary (Last 24 hours) at 12/06/2022 0743 Last data filed at 12/06/2022 0741 Gross per 24 hour  Intake 400 ml  Output --  Net 400 ml        Physical Exam: Vital Signs Blood pressure (!) 138/50, pulse 81, temperature 98.6 F (37 C), temperature source Oral, resp. rate 18, height 5\' 6"  (1.676 m), weight 72.6 kg, SpO2 96%.    General: No acute distress Mood and affect are appropriate Heart: Regular rate and rhythm no rubs murmurs or extra sounds Lungs: Clear to auscultation, breathing unlabored, no rales or wheezes Abdomen: Positive bowel sounds, soft nontender to palpation, mildly distended Extremities: No clubbing, cyanosis, or edema Skin: No evidence of breakdown, no evidence of rash Neurologic: alert, delayed processing and engagement.,  limited verbal output, can respond to basic questions with extra time and repetition--- often answers with thumbs up to questions + left hemi-neglect is improving. MMT: 4/5 RUE and RLE. 0-tr/5 left deltoid, bicep, tricep, grip, hip flexor, knee extensors, ankle dorsiflexor and plantar flexors.   Sensory exam normal sensation to LT in bilateral upper and lower extremities Musculoskeletal: Full range of motion in all 4 extremities. No joint swelling   Assessment/Plan: 1. Functional deficits which require 3+ hours per day of interdisciplinary therapy in a comprehensive inpatient rehab setting. Physiatrist is providing close team supervision and 24 hour  management of active medical problems listed below. Physiatrist and rehab team continue to assess barriers to discharge/monitor patient progress toward functional and medical goals  Care Tool:  Bathing    Body parts bathed by patient: Chest, Abdomen, Face, Left arm, Right upper leg   Body parts bathed by helper: Right arm, Front perineal area, Buttocks, Left lower leg, Right upper leg     Bathing assist Assist Level: Moderate Assistance - Patient 50 - 74%     Upper Body Dressing/Undressing Upper body dressing   What is the patient wearing?: Pull over shirt    Upper body assist Assist Level: Maximal Assistance - Patient 25 - 49%    Lower Body Dressing/Undressing Lower body dressing      What is the patient wearing?: Incontinence brief, Pants     Lower body assist Assist for lower body dressing: 2 Helpers     Toileting Toileting    Toileting assist Assist for toileting: Dependent - Patient 0%     Transfers Chair/bed transfer  Transfers assist  Chair/bed transfer activity did not occur: Safety/medical concerns  Chair/bed transfer assist level: Dependent - mechanical lift     Locomotion Ambulation   Ambulation assist  Ambulation activity did not occur: Safety/medical concerns          Walk 10 feet activity   Assist  Walk 10 feet activity did not occur: Safety/medical concerns        Walk 50 feet activity   Assist Walk 50 feet with 2 turns activity did not occur: Safety/medical concerns         Walk 150 feet activity   Assist Walk 150 feet activity did not occur: Safety/medical concerns         Walk 10 feet on uneven surface  activity   Assist Walk 10 feet on uneven surfaces activity did not occur: Safety/medical concerns         Wheelchair     Assist Is the patient using a wheelchair?: Yes Type of Wheelchair: Manual Wheelchair activity did not occur: Safety/medical concerns         Wheelchair 50 feet with 2 turns  activity    Assist    Wheelchair 50 feet with 2 turns activity did not occur: Safety/medical concerns       Wheelchair 150 feet activity     Assist  Wheelchair 150 feet activity did not occur: Safety/medical concerns       Blood pressure (!) 138/50, pulse 81, temperature 98.6 F (37 C), temperature source Oral, resp. rate 18, height 5\' 6"  (1.676 m), weight 72.6 kg, SpO2 96%.  Medical Problem List and Plan: 1. 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  Antithrombotics: -DVT/anticoagulation:  Pharmaceutical:             left tib and peroneal DVTs, started on Eliquis, pt asymptomatic ,Repeat doppler showing resolution of calf DVT cont Lovenox - no f/u doppler needed             -antiplatelet therapy: Aspirin and Plavix for three weeks followed by Plavix alone.  Last dose of aspirin will be 7/29   -7/23 resumed plavix without any problems   -7/27 discussed benefits of Lovenox, will continue and patient agreeable 3. Pain Management: Tylenol as needed   4. Mood/Behavior/Sleep: LCSW to evaluate and provide emotional support             -antipsychotic agents: n/a Add low dose ritalin for initiation issues---7/25 increase to 10mg     5. Neuropsych/cognition: This patient is not capable of making decisions on his own behalf.             -pt has hx of ETOH abuse, 2-3 beers + wine most nights 6. Skin/Wound Care -maculopapular rash--likely d/t b12  -rash resolved with steroid taper and discontinuance of B12 supp    7. Fluid nutrition- meal intake variable 25-100%, po fluids ,    8: Hypertension: monitor TID and prn (home meds: lisinopril,  Lopressor)               Vitals:   12/05/22 1945 12/06/22 0350  BP: (!) 151/55 (!) 138/50  Pulse: 94 81  Resp: 18 18  Temp: (!) 97.4 F (36.3 C) 98.6 F (37 C)  SpO2: 94% 96%    --persistent systolic elevation  -7/25 pt on amlodipine 10mg , resume lisinopril at 5mg  qd              7/26--obsv bp today with lisinopril on board  7/27 Increase lisinopril to 10mg  for elevated BP  Restart low dose metoprolol- if BP climbs up again 9: Hyperlipidemia: continue statin   10: Prediabetes: ? diet controlled; A1c = 5.7%   11: Macrocytic anemia/B12 deficiency: follow-up CBC- anemia resolved with supplementation              -hx of pernicious anemia; PCP rec: daily B12      Latest Ref Rng & Units 12/05/2022    6:11 AM 11/28/2022    7:42 AM 11/21/2022    6:27 AM  CBC  WBC 4.0 - 10.5 K/uL 7.4  8.1  9.7   Hemoglobin 13.0 - 17.0 g/dL 69.6  29.5  28.4   Hematocrit 39.0 - 52.0 % 40.5  40.0  40.6   Platelets 150 - 400 K/uL 260  272  283    12. 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 Thyroid nodule-TFT's essentially WNL             -f/u as outpt, thyroid U/S, etc.    14.  Bowel incont- freq type 6 small amt, check KUB to look for stool bal, abd exam benign LOS: 22 days A FACE TO FACE EVALUATION WAS PERFORMED  Erick Colace 12/06/2022, 7:43 AM

## 2022-12-06 NOTE — Progress Notes (Signed)
Speech Language Pathology Weekly Progress and Session Note  Patient Details  Name: Samuel Pearson MRN: [id number] Date of Birth: 1945-11-22  Beginning of progress report period: November 29, 2022 End of progress report period: December 06, 2022  Today's Date: 12/06/2022 SLP Individual Time: 1300-1400 SLP Individual Time Calculation (min): 60 min  Short Term Goals: Week 3: SLP Short Term Goal 1 (Week 3): Patient will demonstrate orientation to time with min multimodal cues SLP Short Term Goal 1 - Progress (Week 3): Met SLP Short Term Goal 2 (Week 3): Patient will answer mildly complex problem solving questions with supervision multimodal A SLP Short Term Goal 2 - Progress (Week 3): Progressing toward goal SLP Short Term Goal 3 (Week 3): Patient will utilize memory compensatory strategies with supervision multimodal A SLP Short Term Goal 3 - Progress (Week 3): Met SLP Short Term Goal 4 (Week 3): Patient will demonstrate selective/sutained attention for >20 minutes with min multimodal A SLP Short Term Goal 4 - Progress (Week 3): Met SLP Short Term Goal 5 (Week 3): Patient will initiate verbal responses in 75% of opportunities with supervision verbal cues SLP Short Term Goal 5 - Progress (Week 3): Met SLP Short Term Goal 6 (Week 3): Patient will demonstrate intellectual awareness by verbalizing two physical and two cognitive impairments min multimodal A SLP Short Term Goal 6 - Progress (Week 3): Met    New Short Term Goals: Week 4: SLP Short Term Goal 1 (Week 4): Patient will demonstrate orientation to time with supervision multimodal cues SLP Short Term Goal 2 (Week 4): Patient will answer mildly complex problem solving questions with supervision multimodal A SLP Short Term Goal 3 (Week 4): Patient will demonstrate selective/sutained attention for >20 minutes with supervision multimodal A SLP Short Term Goal 4 (Week 4): Patient will initiate verbal responses in 90% of opportunities with modI  verbal cues SLP Short Term Goal 5 (Week 4): Patient will demonstrate intellectual awareness by verbalizing two physical and two cognitive impairments supervision multimodal A  Weekly Progress Updates: Patient has made good gains and has met 5/6 STG's this reporting period due to improved awareness, attention, memory, orientation and processing speed. Currently, patient continues to require minA to verbalize physical and cognitive impairments as well as demonstrate selective attention for >20 minutes at a time. He also requires minA to answer mildly complex problem solving questions and orient to time utilizing a calendar. Patient requires mod I assist to initiate verbal responses in at least 75% of opportunities and has significantly improved verbal processing time since admission to rehab. Pt/family education ongoing. Pt would benefit from continued ST intervention to maximize cognitive functioning in order to increase functional independence at d/c.   Intensity: Minumum of 1-2 x/day, 30 to 90 minutes Frequency: 1 to 3 out of 7 days Duration/Length of Stay: 12/13/22 Treatment/Interventions: Cognitive remediation/compensation;Internal/external aids;Speech/Language facilitation;Cueing hierarchy;Environmental controls;Therapeutic Activities;Functional tasks;Patient/family education;Therapeutic Exercise   Daily Session Skilled Therapeutic Interventions:  Skilled therapy session focused on attention and orientation. SLP facilitated session by providing minA to aid patient in creating a list of items he thought would be in the gift shop. Utilizing this list, the SLP and patient visited the gift shop to find each item. Patient independently attended to the L side and found all items listed. Upon return, the patient listed all items he remembered seeing in the gift shop with minA. Patient able to orient to month/year/date today, however unable to independently note the day of the week without external aid.  Patient  left in [facility] with alarm on and call bell in hand. Continue POC.      Pain Denies Pain  Therapy/Group: Individual Therapy  Esteven Overfelt M.A., CF-SLP 12/06/2022, 3:21 PM

## 2022-12-06 NOTE — Progress Notes (Signed)
Occupational Therapy Session Note  Patient Details  Name: Samuel Pearson MRN: [id number] Date of Birth: November 21, 1945  Today's Date: 12/06/2022 OT Individual Time: 4098-1191 session 1 OT Individual Time Calculation (min): 41 min  Session 2: 4782-9562   Short Term Goals: Week 3:  OT Short Term Goal 1 (Week 3): Pt will be able use his R arm to correct his postition to midline with CGA and min cues when he is leaning to the left in the bed. OT Short Term Goal 2 (Week 3): Pt will demonstrate improved ability to follow directions to don shirt with mod A and mod cues. OT Short Term Goal 3 (Week 3): Pt will maintain static sitting with max A of 1.  Skilled Therapeutic Interventions/Progress Updates:  Session 1: pt greeted in dayroom with PT to collaborate for w/c eval. Topics discussed included:  - half lap try for ADL participation for LUE - brake extensions d/t LUE hemiplegia - swing away lateral supports for ADL transfers  Pt transported back to room in w/c with total A. Ended session with pt left up in w/c with alarm belt activated and all needs within reach.   Session 2:  Pt transported to gym in w/c with total A. Pt completed SB transfer to mat table to R side with MAX A +2, MAX multimodal cues needed to shift weight anteriorly and not to push with RUE. Once EOM utilized various strategies to work on static sitting balance and anterior weight shifts. Pt able to lean forward onto therapy ball with pt able to hold balance with moments of light MINA. Graded task up and instructed pt to use RUE to reach for horseshoes on R side to negate pushing with RUE with pt then instructed to place horseshoes on L side with an emphasis on crossing midline while maintaining balance.  Utilized wedge under pts L hip to decrease L lateral lean with mild improvements noted during sitting balance, additionally utilized therapy ball at pts LUE to decrease lateral leaning. Overall pt with moments of sitting balance  at CGA.  Noted pt to be soiled during session, placed partition around pt to change brief, pt left with OT CS for brief change.   Therapy Documentation Precautions:  Precautions Precautions: Fall Precaution Comments: dense L hemi with inability to initiate motor control with R hemibody, easily distracted Restrictions Weight Bearing Restrictions: No    Pain: Pain Assessment Pain Scale: 0-10 Pain Score: 0-No pain    Therapy/Group: Individual Therapy  Pollyann Glen [facility] 12/06/2022, 11:15 AM

## 2022-12-06 NOTE — Progress Notes (Signed)
Patient ID: Samuel Pearson, male   DOB: 08-04-1945, 77 y.o.   MRN: [id number]  Patient authorization started.   Reference #: [id number] Clinicals faxed to [phone]

## 2022-12-07 DIAGNOSIS — I63529 Cerebral infarction due to unspecified occlusion or stenosis of unspecified anterior cerebral artery: Secondary | ICD-10-CM

## 2022-12-07 DIAGNOSIS — K560 Paralytic ileus: Secondary | ICD-10-CM

## 2022-12-07 MED ORDER — SODIUM CHLORIDE 0.45 % IV SOLN: INTRAVENOUS | Status: DC

## 2022-12-07 MED ORDER — METOCLOPRAMIDE HCL 5 MG/ML IJ SOLN
5.0000 mg | Freq: Three times a day (TID) | INTRAMUSCULAR | Status: DC
  Administered 2022-12-07 – 2022-12-08 (×4): 5 mg via INTRAVENOUS
  Filled 2022-12-07 (×5): qty 1

## 2022-12-07 NOTE — Progress Notes (Signed)
Patient ID: Samuel Pearson, male   DOB: August 30, 1945, 77 y.o.   MRN: [id number]  Team Conference Report to Patient/Family  Team Conference discussion was reviewed with the patient and caregiver, including goals, any changes in plan of care and target discharge date.  Patient and caregiver express understanding and are in agreement.  The patient has a target discharge date of  (SNF pending).   SW informed spouse of the COVID outbreak at [facility]. Facility unable to accept patients currently. Sw informed spouse. Spouse would like to move forward with [facility]. SW will inform facility. D/C date will be determined based on facility readiness and medical stability. Sw will confirm, no additional questions or concern.  Andria Rhein 12/07/2022, 1:54 PM

## 2022-12-07 NOTE — Progress Notes (Signed)
Speech Language Pathology Daily Session Note  Patient Details  Name: Samuel Pearson MRN: [id number] Date of Birth: 08-Oct-1945  Today's Date: 12/07/2022 SLP Individual Time: 1102-1201 SLP Individual Time Calculation (min): 59 min  Short Term Goals: Week 4: SLP Short Term Goal 1 (Week 4): Patient will demonstrate orientation to time with supervision multimodal cues SLP Short Term Goal 2 (Week 4): Patient will answer mildly complex problem solving questions with supervision multimodal A SLP Short Term Goal 3 (Week 4): Patient will demonstrate selective/sutained attention for >20 minutes with supervision multimodal A SLP Short Term Goal 4 (Week 4): Patient will initiate verbal responses in 90% of opportunities with modI verbal cues SLP Short Term Goal 5 (Week 4): Patient will demonstrate intellectual awareness by verbalizing two physical and two cognitive impairments supervision multimodal A  Skilled Therapeutic Interventions: Skilled treatment session focused on cognitive goals. SLP facilitated session by providing supervision A during 4-5 step functional sequencing tasks. Patient independently oriented to time with external aid and recalled morning events with modI A. Initiation of verbal responses were noted in 90% of opportunities with modI A and continued improvement of verbal response time. Patient required supervision A to attend to task during completion of sequencing activity as he attempted to attend to both ST activity and Olympic games on TV. Patient left in chair with alarm set and call bell in reach. Continue POC.   Pain Pain Assessment Pain Scale: 0-10 Pain Score: 0-No pain  Therapy/Group: Individual Therapy  Jenavie Stanczak M.A., CF-SLP 12/07/2022, 12:37 PM

## 2022-12-07 NOTE — Progress Notes (Signed)
Patient ID: Samuel Pearson, male   DOB: 05/25/1945, 77 y.o.   MRN: [id number]  Spouse and family would like to move forward with the option of [facility]

## 2022-12-07 NOTE — Progress Notes (Addendum)
Patient ID: Samuel Pearson, male   DOB: 18-Jan-1946, 77 y.o.   MRN: [id number]  SW spoke with Misty Stanley with [employer] on patient authorization. Misty Stanley plans to send out approval to begin tomorrow 8/1-8-5.

## 2022-12-07 NOTE — Progress Notes (Signed)
Occupational Therapy Session Note  Patient Details  Name: Samuel Pearson MRN: [id number] Date of Birth: [birth year]  Today's Date: 12/07/2022 OT Individual Time: 0853-1000 OT Individual Time Calculation (min): 67 min  and Today's Date: 12/07/2022 OT Missed Time: 8 Minutes Missed Time Reason: Other (comment) (delay in service)   Short Term Goals: Week 3:  OT Short Term Goal 1 (Week 3): Pt will be able use his R arm to correct his postition to midline with CGA and min cues when he is leaning to the left in the bed. OT Short Term Goal 2 (Week 3): Pt will demonstrate improved ability to follow directions to don shirt with mod A and mod cues. OT Short Term Goal 3 (Week 3): Pt will maintain static sitting with max A of 1.  Skilled Therapeutic Interventions/Progress Updates:  Pt greeted supine in bed needing a brief change. Total A for pericare and brief change from bed level. Pt able to roll to L side with MIN- MODA. Pt transitioned into sitting to R side with pt needing cues to recall hooking BLEs method and needed MAX A to elevate trunk into sitting. Worked on sitting EOB to don shirt, when pt is completing a task with RUE his sitting balance does slightly improve however pt continues to require at least MOD A for sitting balance. Pt required overall MAX A to don [place] shirt, needing assist to orient shirt and thread LUE into shirt. Donned pants from EOB via figure 4 with pt needing assist to cross BLEs but able to assist with threading shorts when legs are crossed from EOB.  Pt stood from EOB with pts RUE over techs back to decrease pushing with pt standing with overall MAX A +2 while therapist and tech pulled pants to waist line. Utilized stedy for OOB transfer with pt needing MAX A +2 to stand to stedy. Dependent transfer into w/c and to don shoes.  Transferred pt to gym with total A. pt completed SB transfer to mat table to R side with MAX A +2, pt requires cues and physical assist to shift trunk  forward. MAX cues needed for head/hips relationship. Once EOM, placed therapy ball under pts LUE to elongate L flank and provide lateral support. Pt continues to lean back, but when cued to place R elbow on mat sitting balance improves to MINA at times.   Worked on lateral leans and dynamic reaching with pt instructed to reach RUE to R side of mat to retrieve cones and then then transport cones to L side with an emphasis on WB'ing into LUE placed on therapy ball and facilitating anterior pelvic tilt as precursor to higher level functional mobility tasks. Pt needed MAX cues to shift trunk off of posterior supporting surface  Additionally worked on anterior weight shifting with pt instructed to lean bilateral forearms on bench placed in front of pt to facilitate anterior pelvic tilt. Pt needed MAX efforts to position bilateral forearms on bench d/t impaired motor planning and attention. Once pt positioned fully forward, pt able to use RUE to reach to mirror and remove squigz from mirror to further facilitate anterior lean. Timed pt during functional reaching to improve reaction time/ overall initiation during functional tasks.   Pt completed additional SB transfer back to w/c to R side with improved anterior lean after working on leaning forward on bench.  Ended session with pt seated in TIS, with LUE supported, alarm belt activated and all needs within reach.   Therapy Documentation Precautions:  Precautions Precautions: Fall Precaution Comments: dense L hemi with inability to initiate motor control with R hemibody, easily distracted Restrictions Weight Bearing Restrictions: No General: General OT Amount of Missed Time: 8 Minutes  Pain: Unrated pain reported in L hip when leaning forward on bench, provided rest breaks and decreased AROM    Therapy/Group: Individual Therapy  Pollyann Glen [facility] 12/07/2022, 12:04 PM

## 2022-12-07 NOTE — Progress Notes (Signed)
Patient ID: Samuel Pearson, male   DOB: [birth year], 77 y.o.   MRN: [id number]  SW followed up with [place] at [facility] on patient SNF referral. Sw will wait for FU/

## 2022-12-07 NOTE — Patient Care Conference (Signed)
Inpatient RehabilitationTeam Conference and Plan of Care Update Date: 12/07/2022   Time: 10:07 AM    Patient Name: Samuel Pearson      Medical Record Number: [id number]  Date of Birth: 12/03/1945 Sex: Male         Room/Bed: 4M03C/4M03C-01 Payor Info: Payor: Multimedia programmer / Plan: UHC MEDICARE / Product Type: *No Product type* /    Admit Date/Time:  11/14/2022  4:37 PM  Primary Diagnosis:  CVA (cerebral vascular accident) [facility])  Hospital Problems: Principal Problem:   CVA (cerebral vascular accident) [facility])    Expected Discharge Date: Expected Discharge Date:  (SNF pending)  Team Members Present: Physician leading conference: Dr. Claudette Laws Social Worker Present: Lavera Guise, BSW Nurse Present: Chana Bode, RN PT Present: Ralph Leyden, PT OT Present: Roney Mans, OT SLP Present: Feliberto Gottron, SLP PPS Coordinator present : Fae Pippin, SLP     Current Status/Progress Goal Weekly Team Focus  Bowel/Bladder   Incontinent of B/B   Regain some continence   Offer toileting frequently.    Swallow/Nutrition/ Hydration               ADL's   continuing to required MAX- total A for ADLs, did shower pt this reporting period with improvements in sitting balance when pt cued to reach for targets on R side. doing some better with stedy transfers.   mod A- MAX A   improving postural control to enable pt to sit safely on BSC to develop continence, improving awareness of L side to improve safety with mobility    Mobility   Bed mobility = Max/TotA, transfers = Max/ TotA using STEDY, sitting balance = TotA with no initiation to correct in any direction, but will engage trunk with reaching activity to anterior, right side with MaxA to maintain position; ambulation initiated requiring MaxA +2 with 3 musketeer support for L side and to maintain upright trunk posture reaching 6 steps.   MaxA overall - continue to monitor for downgrading to TotA/ DEP   Barriers: continued L hemiplegia with slow processing/ planning /// Work on: awareness, initiation, motor planning, motor control of unaffected side, midline orientation, family education    Building services engineer Observations  increased verbal response time, independently aware of L sided deficits and verball reminds himself to look to the L. 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 Remainder of skin intact   Prevent new skin breakdown  Assess Qshift and prn      Discharge Planning:  Family has decided to move forward with SNF placement due to pt's level of care needs. Auth approved. SW waiting on family to determine of [facility] or [facility] .   Team Discussion: Patient with nausea, poor intake; new ileus post CVA. IVF and change to clear liquid diet for now per MD.  Patient on target to meet rehab goals: no, patient with little functional improvement noted.  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 Movement in left arm with involuntary activity but cannot attend.  Needs max - total assist for ADLs.   *See Care Plan and progress notes for long and short-term goals.  Revisions to Treatment Plan:  Michiel Sites training W/C consult   Teaching Needs: Safety, medications, dietary modification, transfers, toileting, etc. Loop recorder care  Current Barriers to Discharge: Home enviroment access/layout, Incontinence, and Lack of/limited family support  Possible Resolutions to Barriers: SNF pending Authorization for SNF     Medical Summary Current Status: Mild ileus, Reglan started, switched to clear liquid diet, will advance as tolerated     Possible Resolutions to Barriers/Weekly Focus: Monitor for signs of worsening ileus may need NG  tube   Continued Need for Acute Rehabilitation Level of Care: The patient requires daily medical management by a physician with specialized training in physical medicine and rehabilitation for the following reasons: Direction of a multidisciplinary physical rehabilitation program to maximize functional independence : Yes Medical management of patient stability for increased activity during participation in an intensive rehabilitation regime.: Yes Analysis of laboratory values and/or radiology reports with any subsequent need for medication adjustment and/or medical intervention. : Yes   I attest that I was present, lead the team conference, and concur with the assessment and plan of the team.   Chana Bode B 12/07/2022, 2:43 PM

## 2022-12-07 NOTE — Progress Notes (Addendum)
PROGRESS NOTE   Subjective/Complaints: Poor appetite and nausea , no vomiting  Reviewed KUB  Large BM yesterday evening  ROS: Limited due to cognitive/behavioral      Objective:   DG Abd 1 View  Result Date: 12/06/2022 CLINICAL DATA:  Abdominal distension.  Pain since yesterday. EXAM: ABDOMEN - 1 VIEW COMPARISON:  11/13/22 FINDINGS: Diffuse gaseous distension of the scratch there is mild diffuse gaseous distension of the bowel loops similar when compared with 11/13/2022. There is retained stool identified within the rectum. Surgical clips noted within the right upper quadrant. No signs of pneumoperitoneum. IMPRESSION: Mild diffuse gaseous distension of the bowel loops similar when compared with 11/13/2022. Findings may reflect underlying adynamic ileus. Electronically Signed   By: Signa Kell M.D.   On: 12/06/2022 12:16   Recent Labs    12/05/22 0611  WBC 7.4  HGB 13.7  HCT 40.5  PLT 260      Recent Labs    12/05/22 0611  NA 136  K 3.8  CL 105  CO2 24  GLUCOSE 120*  BUN 9  CREATININE 0.62  CALCIUM 8.5*       Intake/Output Summary (Last 24 hours) at 12/07/2022 0736 Last data filed at 12/06/2022 1841 Gross per 24 hour  Intake 598 ml  Output --  Net 598 ml        Physical Exam: Vital Signs Blood pressure (!) 142/57, pulse 82, temperature 98.8 F (37.1 C), resp. rate 18, height 5\' 6"  (1.676 m), weight 72.6 kg, SpO2 95%.    General: No acute distress Mood and affect are appropriate Heart: Regular rate and rhythm no rubs murmurs or extra sounds Lungs: Clear to auscultation, breathing unlabored, no rales or wheezes Abdomen: reduced bowel sounds, soft nontender to palpation, mildly distended Extremities: No clubbing, cyanosis, or edema Skin: No evidence of breakdown, no evidence of rash Neurologic: alert, delayed processing and engagement.,  limited verbal output, can respond to basic questions with  extra time and repetition--- often answers with thumbs up to questions + left hemi-neglect is improving. MMT: 4/5 RUE and RLE. 0-tr/5 left deltoid, bicep, tricep, grip, hip flexor, knee extensors, ankle dorsiflexor and plantar flexors.   Sensory exam normal sensation to LT in bilateral upper and lower extremities Musculoskeletal: Full range of motion in all 4 extremities. No joint swelling   Assessment/Plan: 1. Functional deficits which require 3+ hours per day of interdisciplinary therapy in a comprehensive inpatient rehab setting. Physiatrist is providing close team supervision and 24 hour management of active medical problems listed below. Physiatrist and rehab team continue to assess barriers to discharge/monitor patient progress toward functional and medical goals  Care Tool:  Bathing    Body parts bathed by patient: Chest, Abdomen, Face, Left arm, Right upper leg   Body parts bathed by helper: Right arm, Front perineal area, Buttocks, Left lower leg, Right upper leg     Bathing assist Assist Level: Moderate Assistance - Patient 50 - 74%     Upper Body Dressing/Undressing Upper body dressing   What is the patient wearing?: Pull over shirt    Upper body assist Assist Level: Maximal Assistance - Patient 25 - 49%  Lower Body Dressing/Undressing Lower body dressing      What is the patient wearing?: Incontinence brief, Pants     Lower body assist Assist for lower body dressing: 2 Helpers     Toileting Toileting    Toileting assist Assist for toileting: Dependent - Patient 0%     Transfers Chair/bed transfer  Transfers assist  Chair/bed transfer activity did not occur: Safety/medical concerns  Chair/bed transfer assist level: Dependent - mechanical lift     Locomotion Ambulation   Ambulation assist   Ambulation activity did not occur: Safety/medical concerns          Walk 10 feet activity   Assist  Walk 10 feet activity did not occur:  Safety/medical concerns        Walk 50 feet activity   Assist Walk 50 feet with 2 turns activity did not occur: Safety/medical concerns         Walk 150 feet activity   Assist Walk 150 feet activity did not occur: Safety/medical concerns         Walk 10 feet on uneven surface  activity   Assist Walk 10 feet on uneven surfaces activity did not occur: Safety/medical concerns         Wheelchair     Assist Is the patient using a wheelchair?: Yes Type of Wheelchair: Manual Wheelchair activity did not occur: Safety/medical concerns         Wheelchair 50 feet with 2 turns activity    Assist    Wheelchair 50 feet with 2 turns activity did not occur: Safety/medical concerns   Assist Level: Maximal Assistance - Patient 25 - 49%   Wheelchair 150 feet activity     Assist  Wheelchair 150 feet activity did not occur: Safety/medical concerns       Blood pressure (!) 142/57, pulse 82, temperature 98.8 F (37.1 C), resp. rate 18, height 5\' 6"  (1.676 m), weight 72.6 kg, SpO2 95%.  Medical Problem List and Plan: 1. 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  Antithrombotics: -DVT/anticoagulation:  Pharmaceutical:             left tib and peroneal DVTs, started on Eliquis, pt asymptomatic ,Repeat doppler showing resolution of calf DVT cont Lovenox - no f/u doppler needed             -antiplatelet therapy: Aspirin and Plavix for three weeks followed by Plavix alone.  Last dose of aspirin will be 7/29   -7/23 resumed plavix without any problems   -7/27 discussed benefits of Lovenox, will continue and patient agreeable 3. Pain  Management: Tylenol as needed   4. Mood/Behavior/Sleep: LCSW to evaluate and provide emotional support             -antipsychotic agents: n/a Add low dose ritalin for initiation issues---7/25 increase to 10mg     5. Neuropsych/cognition: This patient is not capable of making decisions on his own behalf.             -  pt has hx of ETOH abuse, 2-3 beers + wine most nights 6. Skin/Wound Care -maculopapular rash--likely d/t b12  -rash resolved with steroid taper and discontinuance of B12 supp    7. Fluid nutrition- meal intake variable 25-100%, po fluids ,    8: Hypertension: monitor TID and prn (home meds: lisinopril, Lopressor)               Vitals:   12/06/22 2210 12/07/22 0447  BP: (!) 138/56 (!) 142/57  Pulse: 89 82  Resp: 18 18  Temp: 98.1 F (36.7 C) 98.8 F (37.1 C)  SpO2: 96% 95%    --persistent systolic elevation  -7/25 pt on amlodipine 10mg , resume lisinopril at 5mg  qd              7/26--obsv bp today with lisinopril on board  7/27 Increase lisinopril to 10mg  for elevated BP  Restart low dose metoprolol- if BP climbs up again 9: Hyperlipidemia: continue statin   10: Prediabetes: ? diet controlled; A1c = 5.7%   11: Macrocytic anemia/B12 deficiency: follow-up CBC- anemia resolved with supplementation              -hx of pernicious anemia; PCP rec: daily B12      Latest Ref Rng & Units 12/05/2022    6:11 AM 11/28/2022    7:42 AM 11/21/2022    6:27 AM  CBC  WBC 4.0 - 10.5 K/uL 7.4  8.1  9.7   Hemoglobin 13.0 - 17.0 g/dL 16.1  09.6  04.5   Hematocrit 39.0 - 52.0 % 40.5  40.0  40.6   Platelets 150 - 400 K/uL 260  272  283    12. 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 Thyroid nodule-TFT's essentially WNL             -f/u as outpt, thyroid U/S, etc.    14.  Mild adynamic ileus clear liq diet, IVF at noc, reglan , recheck KUB in 1-2 d , monitor for vomiting , if so may need NGT  LOS: 23  days A FACE TO FACE EVALUATION WAS PERFORMED  Erick Colace 12/07/2022, 7:36 AM

## 2022-12-07 NOTE — Progress Notes (Signed)
Physical Therapy Session Note  Patient Details  Name: Samuel Pearson MRN: [id number] Date of Birth: 1945/06/18  Today's Date: 12/07/2022 PT Individual Time:  1002-1107  PT Individual Time Calculation (min): 65 min  Short Term Goals: Week 2:  PT Short Term Goal 1 (Week 2): Pt will perform all aspects of bed mobility with consistent MaxA. PT Short Term Goal 1 - Progress (Week 2): Progressing toward goal PT Short Term Goal 2 (Week 2): Pt will perform sit<>stand with MaxA +1 PT Short Term Goal 2 - Progress (Week 2): Not met PT Short Term Goal 3 (Week 2): Pt will perform seat to seat transfers with MaxA +1 PT Short Term Goal 3 - Progress (Week 2): Not met PT Short Term Goal 4 (Week 2): Pt will initiate sitting balance and maintain with MaxA+1. PT Short Term Goal 4 - Progress (Week 2): Not met PT Short Term Goal 5 (Week 2): Pt will initiate gait training. PT Short Term Goal 5 - Progress (Week 2): Met Week 3:  PT Short Term Goal 1 (Week 3): Pt will perform all aspects of bed mobility with consistent MaxA. PT Short Term Goal 2 (Week 3): Pt will perform sit<>stand with MaxA +1 PT Short Term Goal 3 (Week 3): Pt will demo improved ability to initiate toward midline without need for external cues for reaching. Will initiate >20% of attempts. PT Short Term Goal 4 (Week 3): Pt will initiate sitting balance and maintain with MaxA+1. PT Short Term Goal 5 (Week 3): Pt will ambulate with MaxA +2 up to 20 steps.  Skilled Therapeutic Interventions/Progress Updates:  Patient seated upright in TIS w/c on entrance to room. Patient alert and agreeable to PT session. Wife present and invited to w/c evaluation.   Patient with no pain complaint at start of session.  Details of injury: infarcts in the right ACA territory along the parasagittal right frontal cortex and in the right frontal centrum seminal ovale  Potential lifelong user of MWC.   Will require a custom ultra lightweight chair to achieve  independence with mobility with TIS feature for reduced trunk control and safe posturing.   He currently does not demonstrate volitional trunk control in order to weight shift. This will require a skin protectant/positioning cushion.   Harrie Jeans, ATP present for custom manual wheelchair evaluation. Barron Schmid COTA also present.   Therapists, patient, wife, and ATP discussed the following necessities for pt's custom wheelchair:  - 18" width x17" depth Liberty FT wheelchair in order to allow a lighter weight frame to decrease burden of care on family while also allowing pt more independence with propulsion - Need for padded 1/2 lap tray for L UE to allow support of the extremity to improve joint alignment while also allow pt opportunities to utilize that UE for NMR - 4 point seat belt to provide pelvic stability as pt tends to have significant posterior pelvic tilt causing him to slide forward in w/c placing him at risk for falling - Hybrid Roho cushion with air in the back for pressure relief due to pt inability to weight shift throughout day - contoured back with lateral trunk support due to impaired sitting balance/ trunk control - knee abductor pads to BLE to improve hip alignment and decrease risk of injury to the LE during propulsion - see LMN for full list of w/c modifications.    Pt and wife in agreement with the above recommendations. ATP planning to provide loaner wheelchair Friday 12/09/2022 at 1pm.  Patient seated upright in TIS w/c at end of session with COTA returning pt to room for upcoming lunch arrival.   Therapy Documentation Precautions:  Precautions Precautions: Fall Precaution Comments: dense L hemi with inability to initiate motor control with R hemibody, easily distracted Restrictions Weight Bearing Restrictions: No  Pain:  No pain related this session.   Therapy/Group: Individual Therapy  [name] PT, DPT, CSRS 12/06/2022, 4:51 PM

## 2022-12-07 NOTE — Progress Notes (Signed)
Patient has excellent vasculature. Please assess and attempt x1 prior to IVT consult.

## 2022-12-08 NOTE — Progress Notes (Signed)
PROGRESS NOTE   Subjective/Complaints:  Multiple BMs yesterday type 6 and 7 Good intake of clear liquid , IVF last noc  ROS: Limited due to cognitive/behavioral      Objective:   DG Abd 1 View  Result Date: 12/06/2022 CLINICAL DATA:  Abdominal distension.  Pain since yesterday. EXAM: ABDOMEN - 1 VIEW COMPARISON:  11/13/22 FINDINGS: Diffuse gaseous distension of the scratch there is mild diffuse gaseous distension of the bowel loops similar when compared with 11/13/2022. There is retained stool identified within the rectum. Surgical clips noted within the right upper quadrant. No signs of pneumoperitoneum. IMPRESSION: Mild diffuse gaseous distension of the bowel loops similar when compared with 11/13/2022. Findings may reflect underlying adynamic ileus. Electronically Signed   By: Signa Kell M.D.   On: 12/06/2022 12:16   No results for input(s): "WBC", "HGB", "HCT", "PLT" in the last 72 hours.     No results for input(s): "NA", "K", "CL", "CO2", "GLUCOSE", "BUN", "CREATININE", "CALCIUM" in the last 72 hours.      Intake/Output Summary (Last 24 hours) at 12/08/2022 0739 Last data filed at 12/08/2022 0713 Gross per 24 hour  Intake 1399.81 ml  Output --  Net 1399.81 ml        Physical Exam: Vital Signs Blood pressure (!) 149/49, pulse 79, temperature 98 F (36.7 C), temperature source Oral, resp. rate 19, height 5\' 6"  (1.676 m), weight 67.8 kg, SpO2 97%.    General: No acute distress Mood and affect are appropriate Heart: Regular rate and rhythm no rubs murmurs or extra sounds Lungs: Clear to auscultation, breathing unlabored, no rales or wheezes Abdomen: reduced bowel sounds, soft nontender to palpation, mildly distended Extremities: No clubbing, cyanosis, or edema Skin: No evidence of breakdown, no evidence of rash Neurologic: alert, delayed processing and engagement.,  limited verbal output, can respond to basic  questions with extra time and repetition--- often answers with thumbs up to questions + left hemi-neglect is improving. MMT: 4/5 RUE and RLE. 0-tr/5 left deltoid, bicep, tricep, grip, hip flexor, knee extensors, ankle dorsiflexor and plantar flexors.   Sensory exam normal sensation to LT in bilateral upper and lower extremities Musculoskeletal: Full range of motion in all 4 extremities. No joint swelling   Assessment/Plan: 1. Functional deficits which require 3+ hours per day of interdisciplinary therapy in a comprehensive inpatient rehab setting. Physiatrist is providing close team supervision and 24 hour management of active medical problems listed below. Physiatrist and rehab team continue to assess barriers to discharge/monitor patient progress toward functional and medical goals  Care Tool:  Bathing    Body parts bathed by patient: Chest, Abdomen, Face, Left arm, Right upper leg   Body parts bathed by helper: Right arm, Front perineal area, Buttocks, Left lower leg, Right upper leg     Bathing assist Assist Level: Moderate Assistance - Patient 50 - 74%     Upper Body Dressing/Undressing Upper body dressing   What is the patient wearing?: Pull over shirt    Upper body assist Assist Level: Maximal Assistance - Patient 25 - 49%    Lower Body Dressing/Undressing Lower body dressing      What is the patient wearing?:  Incontinence brief, Pants     Lower body assist Assist for lower body dressing: 2 Helpers     Toileting Toileting    Toileting assist Assist for toileting: Dependent - Patient 0%     Transfers Chair/bed transfer  Transfers assist  Chair/bed transfer activity did not occur: Safety/medical concerns  Chair/bed transfer assist level: Dependent - mechanical lift     Locomotion Ambulation   Ambulation assist   Ambulation activity did not occur: Safety/medical concerns          Walk 10 feet activity   Assist  Walk 10 feet activity did not  occur: Safety/medical concerns        Walk 50 feet activity   Assist Walk 50 feet with 2 turns activity did not occur: Safety/medical concerns         Walk 150 feet activity   Assist Walk 150 feet activity did not occur: Safety/medical concerns         Walk 10 feet on uneven surface  activity   Assist Walk 10 feet on uneven surfaces activity did not occur: Safety/medical concerns         Wheelchair     Assist Is the patient using a wheelchair?: Yes Type of Wheelchair: Manual Wheelchair activity did not occur: Safety/medical concerns         Wheelchair 50 feet with 2 turns activity    Assist    Wheelchair 50 feet with 2 turns activity did not occur: Safety/medical concerns   Assist Level: Maximal Assistance - Patient 25 - 49%   Wheelchair 150 feet activity     Assist  Wheelchair 150 feet activity did not occur: Safety/medical concerns       Blood pressure (!) 149/49, pulse 79, temperature 98 F (36.7 C), temperature source Oral, resp. rate 19, height 5\' 6"  (1.676 m), weight 67.8 kg, SpO2 97%.  Medical Problem List and Plan: 1. 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  Antithrombotics: -DVT/anticoagulation:  Pharmaceutical:             left tib and peroneal DVTs, started on Eliquis, pt asymptomatic ,Repeat doppler showing resolution of calf DVT cont Lovenox - no f/u doppler needed             -antiplatelet therapy: Aspirin and Plavix for three weeks followed by Plavix alone.  Last dose of aspirin will be 7/29   -7/23 resumed plavix without any problems   -7/27 discussed benefits of  Lovenox, will continue and patient agreeable 3. Pain Management: Tylenol as needed   4. Mood/Behavior/Sleep: LCSW to evaluate and provide emotional support             -antipsychotic agents: n/a Add low dose ritalin for initiation issues---7/25 increase to 10mg     5. Neuropsych/cognition: This patient is not capable of making decisions on his own behalf.             -pt has hx  of ETOH abuse, 2-3 beers + wine most nights 6. Skin/Wound Care -maculopapular rash--likely d/t b12  -rash resolved with steroid taper and discontinuance of B12 supp    7. Fluid nutrition- meal intake variable 25-100%, po fluids ,    8: Hypertension: monitor TID and prn (home meds: lisinopril, Lopressor)               Vitals:   12/07/22 2043 12/08/22 0523  BP: (!) 144/49 (!) 149/49  Pulse: 83 79  Resp: 18 19  Temp: 98.2 F (36.8 C) 98 F (36.7 C)  SpO2: 96% 97%    --mild persistent systolic elevation- acceptable range  -7/25 pt on amlodipine 10mg , resume lisinopril at 5mg  qd              9: Hyperlipidemia: continue statin   10: Prediabetes: ? diet controlled; A1c = 5.7%   11: Macrocytic anemia/B12 deficiency: follow-up CBC- anemia resolved with supplementation              -hx of pernicious anemia; PCP rec: daily B12      Latest Ref Rng & Units 12/05/2022    6:11 AM 11/28/2022    7:42 AM 11/21/2022    6:27 AM  CBC  WBC 4.0 - 10.5 K/uL 7.4  8.1  9.7   Hemoglobin 13.0 - 17.0 g/dL 40.9  81.1  91.4   Hematocrit 39.0 - 52.0 % 40.5  40.0  40.6   Platelets 150 - 400 K/uL 260  272  283    12. 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 Thyroid nodule-TFT's essentially WNL             -f/u as outpt, thyroid U/S, etc.    14.  Mild adynamic ileus improving, multiple soft and liquid BMs, good intalke of clear liquids, wil advance diet as tolerated, d/c IV , d/c reglan LOS: 24 days A FACE TO FACE EVALUATION WAS PERFORMED  Erick Colace 12/08/2022, 7:39 AM

## 2022-12-08 NOTE — Progress Notes (Signed)
Occupational Therapy Session Note  Patient Details  Name: Samuel Pearson MRN: [id number] Date of Birth: 02/27/1946  Today's Date: 12/08/2022 OT Individual Time: 4098-1191 OT Individual Time Calculation (min): 70 min    Short Term Goals: Week 3:  OT Short Term Goal 1 (Week 3): Pt will be able use his R arm to correct his postition to midline with CGA and min cues when he is leaning to the left in the bed. OT Short Term Goal 2 (Week 3): Pt will demonstrate improved ability to follow directions to don shirt with mod A and mod cues. OT Short Term Goal 3 (Week 3): Pt will maintain static sitting with max A of 1.  Skilled Therapeutic Interventions/Progress Updates:    Patient received supine in bed - watching the Olympics.  Patient initially not eager to get out of bed, stating it is so warm in here.  Wife at bedside, encouraging patient to get up.   When asked patient what he wishes were easier - he indicated frustration at not being allowed to get up and go the bathroom on his own.  "It's demoralizing to be incontinent."  Patient felt sure he could complete this task.  When asked to demonstrate, patient unable to roll self in either direction.  No movement noted in LLE, and no attempts to assist.  Patient without awareness that his attempts are producing little to no movement.  Patient asked if he typically had trouble rolling to the side - and he indicated he felt tired after PT session earlier.  Assisted patient to roll with max assist, and emphasis on use of active flexion and rotation to lift head off pillow and turn head toward right.  Facilitated flexion/ rotation to roll toward right.  Once on his side, worked incrementally to transition to sitting - trying to maintain flexed rotated position as much as possible to reduce excessive extensor patterning / pushing.  Worked on static sitting balance with emphasis on right hip flexion, and trunk flexing forward over femurs.  Transferred max assist to  wheelchair - squat pivot.   Transported to gym to continue to work on forward flexion rightward.  Used sheet as leverage behind patient and to facilitate forward weight shift to target - worked on partial sit to stand.  Returned to room, left up in wheelchair with safety belt in place and engaged, call bell and personal items in reach.    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

## 2022-12-08 NOTE — Progress Notes (Signed)
Physical Therapy Discharge Summary  Patient Details  Name: Samuel Pearson MRN: [id number] Date of Birth: [birth year]  Date of Discharge from PT service:December 08, 2022   Patient has met {NUMBERS 0-12:18577} of 9 long term goals due to improved activity tolerance and increased strength.  Patient to discharge at a wheelchair level (can manipulate loaner w/c with sup/CGA) requiring overall Total Assist.   Patient's care partner requires assistance/unable at this time to provide the necessary physical assistance at discharge, therefore pt and family are electing for continued therapy at next level of care in SNF setting.  Reasons goals not met: ***  Recommendation:  Patient will benefit from ongoing skilled PT services in skilled nursing facility setting to continue to advance safe functional mobility, address ongoing impairments in awareness, coordination, balance, strength, activity tolerance, cognition, safety awareness, and minimize fall risk.  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 Rarely or not at all Pain Interference with Therapy Activities: 1. Rarely or not at all Pain Interference with Day-to-Day Activities: 1. Rarely or not at all Vision/Perception  Vision - History Ability to See in Adequate Light: 0 Adequate Vision - Assessment Additional Comments: initial gaze preference to R with improved neck ROM since eval Perception Perception: Impaired Inattention/Neglect: Does  not attend to left side of body;Does not attend to left visual field Topographical Orientation: Strong pusher toward L side with minimal functional awareness of push/pull. Can push/ pull with arm in push/ pull exercise with RUE, poor carryover into functional use of pull to stand in STEDY. Praxis Praxis: Impaired Praxis Impairment Details: Initiation;Motor planning Praxis-Other Comments: Pt continues to demonstrate decreased focus and ability to motor plan and then to initiate. Improvement noted overall with initiation in speech/ communication, but minimal progress noted intermittently with R sided pushing. Poor initiation/ awareness of trunk control. Poor awareness of overall deficits with comments re: using toilet, walking.  Cognition Arousal/Alertness: Awake/alert Orientation Level: Oriented X4 Attention: Focused;Sustained;Selective;Alternating Focused Attention: Impaired Sustained Attention: Impaired Selective Attention: Impaired Alternating Attention: Impaired Awareness: Impaired Problem Solving: Impaired Initiating: Impaired Self Monitoring: Impaired Self Correcting: Impaired Safety/Judgment: Impaired Sensation Sensation Light Touch: Appears Intact Hot/Cold: Appears Intact Proprioception: Impaired by gross assessment Coordination Gross Motor Movements are Fluid and Coordinated: No Fine Motor Movements are Fluid and Coordinated: No Coordination and Movement Description: No L hemibody initiation; R hemibody demos slow to initiate/motor plan Motor  Motor Motor: Hemiplegia;Abnormal postural alignment and control Motor - Discharge Observations: No initiation in L hemibody; R hemibody demos slow initiation and poor motor planning  Mobility Bed Mobility Bed Mobility: Rolling Right;Rolling Left;Right Sidelying to Sit;Supine to Sit;Sitting - Scoot to [place] of Bed;Sit to Supine;Scooting to [facility] Rolling Right: Total Assistance - Patient < 25% Rolling Left: Moderate Assistance - Patient  50-74% Right Sidelying to Sit: Maximal Assistance - Patient 25-49% Supine to Sit: Maximal Assistance - Patient - Patient 25-49% Sitting - Scoot to Edge of Bed: Maximal Assistance - Patient 25-49% Sit to Supine: Maximal Assistance - Patient 25-49%;Total Assistance - Patient < 25% Sit to Sidelying Right: Maximal Assistance - Patient 25-49% Scooting to [facility]:  Maximal Assistance - Patient 25-49% Transfers Transfers: Sit to Stand;Stand to Sit Sit to Stand: Maximal Assistance - Patient 25-49%;Total Assistance - Patient < 25% Stand to Sit: Maximal Assistance - Patient 25-49% Stand Pivot Transfers: Dependent - mechanical lift Squat Pivot Transfers: Total Assistance - Patient < 25% Transfer (Assistive device): Other (Comment) (STEDY) Transfer via Lift Equipment: Engineering geologist: No Gait Gait: No Stairs / Additional Locomotion Stairs: No Pick up small object from the floor (from standing position) activity did not occur: Safety/medical Building control surveyor Mobility: Yes Wheelchair Assistance: Supervision/Verbal cueing (Extensive cues to technique, awareness and to stay on task) Wheelchair Propulsion: Left upper extremity;Left lower extremity Wheelchair Parts Management: Needs assistance Distance: 100 ft Trunk/Postural Assessment  Cervical Assessment Cervical Assessment: Exceptions to [facility] Cervical AROM Overall Cervical AROM Comments: limited, 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 Thoracic Assessment Thoracic Assessment: Exceptions to [facility] (Rounded shoulders) Lumbar Assessment Lumbar Assessment: Exceptions to [facility] Postural Control Postural Control: Deficits on evaluation Trunk Control: severely impaired Righting Reactions: decreased (premature response of righting when falling to the L as pt extends R UE into full extension.) Protective Responses: absent on L, decreased on R  Balance Balance Balance Assessed: Yes Standardized Balance  Assessment Standardized Balance Assessment: PASS Postural Assessment Scale for Stroke Patients=PASS 1. Sitting Without Support: Can sit with slight support (for example, by 1 hand) 2. Standing With Support: Can stand with strong support of 2 people 3. Standing Without Support: Cannot stand without support 4.Standing on Nonparetic Leg: Cannot stand on nonparetic leg 5.Standing on Paretic Leg: Cannot stand on paretic leg MAINTAINING POSTURE SUBTOTAL: 2 6. Supine to Paretic Side Lateral: Can perform with little help 7. Supine to Nonparetic Side Lateral: Cannot perform 8. Supine to Sitting Up on the Edge of the Mat: Can perform with much help 9. Sitting on the Edge of the Mat to Supine: Can perform with much help 10. Sitting to Standing Up: Can perform with much help 11. Standing Up to Sitting Down: Can perform with much help 12. Standing,Picking Up a Pencil from the Floor: Cannot perform CHANGING POSTURE SUBTOTAL: 6 PASS TOTAL SCORE: 8 Static Sitting Balance Static Sitting - Level of Assistance: 2: Max assist Dynamic Sitting Balance Sitting balance - Comments: Heavy lean posteriorly to the  L with severely decreased balance strategies. Heavy push to the L and needs maxA to maintain midline while sitting with max cues to bring self over to midline. Extremity Assessment      RLE Assessment RLE Assessment: Within Functional Limits LLE Assessment LLE Assessment: Exceptions to [facility] Strength Comments: 0/5 grossly   Dominic Sandoval PTA 12/08/2022, 3:28 PM

## 2022-12-08 NOTE — Progress Notes (Signed)
Physical Therapy Session Note  Patient Details  Name: Samuel Pearson MRN: [id number] Date of Birth: Feb 05, 1946  Today's Date: 12/07/2022 PT Individual Time:  2440-1027  PT Individual Time Calculation (min): 72 min  Short Term Goals: Week 3:  PT Short Term Goal 1 (Week 3): Pt will perform all aspects of bed mobility with consistent MaxA. PT Short Term Goal 2 (Week 3): Pt will perform sit<>stand with MaxA +1 PT Short Term Goal 3 (Week 3): Pt will demo improved ability to initiate toward midline without need for external cues for reaching. Will initiate >20% of attempts. PT Short Term Goal 4 (Week 3): Pt will initiate sitting balance and maintain with MaxA+1. PT Short Term Goal 5 (Week 3): Pt will ambulate with MaxA +2 up to 20 steps.   Skilled Therapeutic Interventions/Progress Updates:  Patient seated upright on entrance to room. Patient alert and agreeable to PT session. 1/2 lap tray is on floor with pillows on top to pt's L side. Lap tray re-figured with coban and washcloth to improve fit to armrest.   Patient with no pain complaint at start of session.  Wheelchair Mobility:  Pt propelled wheelchair 30' x1/ 55' x1 using hemitechnique and requiring excessive vc for focus on task, maintaining path, and maneuvering objects. Requires significant time to complete d/t ease of distractibility.  Neuromuscular Re-ed: NMR facilitated during session with focus on sitting balance. Pt requires max cueing and initially demos pushing with RUE/ RLE preventing forward flexion of trunk/ hips. Pt given time and significant vc for hip flexion and use of abdominals > back. With time is able to relax extensor tone and initiate. With MaxA pt flexed forward with intermittent noted attempts to push back into extension to prevent incorrect feelings of being in incorrect upright position. MaxA to unweight from seat in order for +2 to adjust shorts and improve posture in seat.   Pt guided in reaching activity  toward R side, toward R diagonal and anterior to patient in order to improve motor planning, motor control, and bodily proprioception. Initially using hands for targets and requiring max cues for focus and completion. Changed to objects for targets and then pt initiates conversation re: quality of handshakes. With cue for providing a strong handshake to +2 with pt able to focus on task and complete within short period of time with 2 cues for focus.   NMR performed for improvements in motor control and coordination, balance, sequencing, judgement, and self confidence/ efficacy in performing all aspects of mobility at highest level of independence.   Patient seated upright in w/c at end of session with brakes locked, belt alarm set, and all needs within reach. Wife in room and providing supervision. She relates that her personal choice of [facility] is unable to take new patients at this time, but [facility] has a bed available, so she will be accepting placement there.   Therapy Documentation Precautions:  Precautions Precautions: Fall Precaution Comments: dense L hemi with inability to initiate motor control with R hemibody, easily distracted Restrictions Weight Bearing Restrictions: No  Pain:  No indication of pain from patient.   Therapy/Group: Individual Therapy  [name] PT, DPT, CSRS  12/08/2022, 6:59 AM

## 2022-12-08 NOTE — Progress Notes (Signed)
Patient ID: Samuel Pearson, male   DOB: [birth year], 77 y.o.   MRN: [id number]  SW received call from CM. Patient auth valid 8/1-8/5.   Authorization number [id number]

## 2022-12-08 NOTE — Progress Notes (Signed)
Physical Therapy Session Note  Patient Details  Name: Samuel Pearson MRN: [id number] Date of Birth: [birth year]  Today's Date: 12/08/2022 PT Individual Time: 0805-0920; 1118 - 1202 PT Individual Time Calculation (min): 75 min; 44 min   Short Term Goals: Week 3:  PT Short Term Goal 1 (Week 3): Pt will perform all aspects of bed mobility with consistent MaxA. PT Short Term Goal 2 (Week 3): Pt will perform sit<>stand with MaxA +1 PT Short Term Goal 3 (Week 3): Pt will demo improved ability to initiate toward midline without need for external cues for reaching. Will initiate >20% of attempts. PT Short Term Goal 4 (Week 3): Pt will initiate sitting balance and maintain with MaxA+1. PT Short Term Goal 5 (Week 3): Pt will ambulate with MaxA +2 up to 20 steps.  SESSION 1 Skilled Therapeutic Interventions/Progress Updates: Patient supine in bed on entrance to room. Patient alert and agreeable to PT session.   Patient reported no pain, and that pt is to be d/c tomorrow (8/2) as there is now a SNF bed available.   Therapeutic Activity: Bed Mobility: Pt had a moderately soiled brief. Personal care initiated in bed with pt supine to R sidelying with totalA, and supine to L sidelying with minA (VC required to use R UE to pull self over via HOB L HR - pt initiated flexion at R knee in order to bridge R hip over to L sidelying). Pt totalA to donn/doff brief, and mod/maxA to donn hospital pants (pt cued to use R UE, and to bridge on R side to donn pants around waist - totalA to don on L side) Transfers: Pt performed sit<>stand transfers to [facility] throughout session with maxA + 2. Provided VC for pt to bring self towards midline orientation per L lean. Pt still requires max multimodal cues to pull using R UE (pt pushes vs pulls), and increased time/effort to process sequence in minimally distracting environment. Pt modA to get close to hip extension, but totalA to pull self up to full extension (pt cued to  initiate stand by counting to 3 and using R UE to pull on bar). Pt cued to control descent (maxA/totalA per presentation of rigid R UE).   Neuromuscular Re-ed: NMR facilitated during session with focus on postural control while short sitting. - Postural control sitting edge of mat with PTA on L side and RT on R side. Pt cued to bring R LE towards object to cross midline (pt heavily leaning to L), and for pt to bring R shoulder to meet RT L shoulder. Pt with decreased attention in moderately distractive environment, and could not sustain focus of 1 step commands for longer than several seconds. Pt required fluctuating assistance with having brief moments of minA to stay in upright position, but then would immediately lean back towards L after losing focused attention.   NMR performed for improvements in motor control and coordination, balance, sequencing, judgement, and self confidence/ efficacy in performing all aspects of mobility at highest level of independence.   Patient in [facility] at end of session with brakes locked, belt alarm set, and all needs within reach.  SESSION 2 Skilled Therapeutic Interventions/Progress Updates: Patient in [facility] with wife present on entrance to room. Patient alert and agreeable to PT session.   Patient reported no pain or change in status since earlier session. Pt's wife brought concerns about pt d/c tomorrow (8/2) as a new bed became available at [facility]. Concerns were primarily that pt will  go several days without going through therapy because of it being over the weekend. PTA relayed message to attending LCSW, and Pt 's wife also stated to have called and left a voicemail with Child psychotherapist. Social worker stated that more information will be made available towards end of day today regarding pt's official go ahead on d/c to [facility]. Pt wheeled to main gym and was noted to be soiled in brief. Pt transported back to room and transferred to EOB via STEDY + 2. Pt cued to use  R UE to pull self up with countdown initiation. Pt required maxA and still with rigid R UE (locked in extension) and required increased multimodal cues to contract biceps. Pt then cued to control descent to EOB. Pt with increased effort to release R UE grip from bar, and was maxA to get from EOB to supine. Pt then maxA to scoot to [facility] via chuck + 2. Pt cued to use R LE to push into bed to assist with scoot.  Patient supine in bed with NT performing personal care at end of session with brakes locked, wife present, and all needs within reach.      Therapy Documentation Precautions:  Precautions Precautions: Fall Precaution Comments: dense L hemi with inability to initiate motor control with R hemibody, easily distracted Restrictions Weight Bearing Restrictions: No  Therapy/Group: Individual Therapy  Milanni Ayub PTA 12/08/2022, 1:01 PM

## 2022-12-09 DIAGNOSIS — G8190 Hemiplegia, unspecified affecting unspecified side: Secondary | ICD-10-CM | POA: Diagnosis not present

## 2022-12-09 DIAGNOSIS — Z9181 History of falling: Secondary | ICD-10-CM | POA: Diagnosis not present

## 2022-12-09 DIAGNOSIS — K219 Gastro-esophageal reflux disease without esophagitis: Secondary | ICD-10-CM | POA: Diagnosis not present

## 2022-12-09 DIAGNOSIS — E785 Hyperlipidemia, unspecified: Secondary | ICD-10-CM | POA: Diagnosis not present

## 2022-12-09 DIAGNOSIS — Z743 Need for continuous supervision: Secondary | ICD-10-CM | POA: Diagnosis not present

## 2022-12-09 DIAGNOSIS — I69328 Other speech and language deficits following cerebral infarction: Secondary | ICD-10-CM | POA: Diagnosis not present

## 2022-12-09 DIAGNOSIS — R1312 Dysphagia, oropharyngeal phase: Secondary | ICD-10-CM | POA: Diagnosis not present

## 2022-12-09 DIAGNOSIS — I69391 Dysphagia following cerebral infarction: Secondary | ICD-10-CM | POA: Diagnosis not present

## 2022-12-09 DIAGNOSIS — D696 Thrombocytopenia, unspecified: Secondary | ICD-10-CM | POA: Diagnosis not present

## 2022-12-09 DIAGNOSIS — Z7401 Bed confinement status: Secondary | ICD-10-CM | POA: Diagnosis not present

## 2022-12-09 DIAGNOSIS — I63521 Cerebral infarction due to unspecified occlusion or stenosis of right anterior cerebral artery: Secondary | ICD-10-CM | POA: Diagnosis not present

## 2022-12-09 DIAGNOSIS — I69354 Hemiplegia and hemiparesis following cerebral infarction affecting left non-dominant side: Secondary | ICD-10-CM | POA: Diagnosis not present

## 2022-12-09 DIAGNOSIS — R2689 Other abnormalities of gait and mobility: Secondary | ICD-10-CM | POA: Diagnosis not present

## 2022-12-09 DIAGNOSIS — I10 Essential (primary) hypertension: Secondary | ICD-10-CM | POA: Diagnosis not present

## 2022-12-09 DIAGNOSIS — I63529 Cerebral infarction due to unspecified occlusion or stenosis of unspecified anterior cerebral artery: Secondary | ICD-10-CM | POA: Diagnosis not present

## 2022-12-09 DIAGNOSIS — I69320 Aphasia following cerebral infarction: Secondary | ICD-10-CM | POA: Diagnosis not present

## 2022-12-09 DIAGNOSIS — M6281 Muscle weakness (generalized): Secondary | ICD-10-CM | POA: Diagnosis not present

## 2022-12-09 MED ORDER — ACETAMINOPHEN 325 MG PO TABS: 325.0000 mg | ORAL_TABLET | ORAL | Status: AC | PRN

## 2022-12-09 MED ORDER — FOLIC ACID 1 MG PO TABS: 1.0000 mg | ORAL_TABLET | Freq: Every day | ORAL | Status: AC

## 2022-12-09 MED ORDER — LISINOPRIL 10 MG PO TABS: 10.0000 mg | ORAL_TABLET | Freq: Every day | ORAL | Status: AC

## 2022-12-09 MED ORDER — PANTOPRAZOLE SODIUM 40 MG PO TBEC: 40.0000 mg | DELAYED_RELEASE_TABLET | Freq: Every day | ORAL | Status: AC

## 2022-12-09 MED ORDER — METHOCARBAMOL 500 MG PO TABS: 500.0000 mg | ORAL_TABLET | Freq: Four times a day (QID) | ORAL | 0 refills | Status: AC | PRN

## 2022-12-09 MED ORDER — METHYLPHENIDATE HCL 10 MG PO TABS: 10.0000 mg | ORAL_TABLET | Freq: Two times a day (BID) | ORAL | 0 refills | Status: AC

## 2022-12-09 NOTE — Progress Notes (Signed)
Physical Therapy Session Note  Patient Details  Name: Samuel Pearson MRN: [id number] Date of Birth: 1945-05-28  Today's Date: 12/09/2022 PT Individual Time: 0829-0900 PT Individual Time Calculation (min): 31 min   Short Term Goals: Week 3:  PT Short Term Goal 1 (Week 3): Pt will perform all aspects of bed mobility with consistent MaxA. PT Short Term Goal 2 (Week 3): Pt will perform sit<>stand with MaxA +1 PT Short Term Goal 3 (Week 3): Pt will demo improved ability to initiate toward midline without need for external cues for reaching. Will initiate >20% of attempts. PT Short Term Goal 4 (Week 3): Pt will initiate sitting balance and maintain with MaxA+1. PT Short Term Goal 5 (Week 3): Pt will ambulate with MaxA +2 up to 20 steps.   Skilled Therapeutic Interventions/Progress Updates:    Pt presents in bed reporting he is hot. Pt prefers to stay in hospital gown due to being hot at this time. Pt soiled (unaware), so focused on rolling in the bed for brief change and hygiene. Pt with difficulty attending to task requiring cues and redirection. Requires max assist for rolling and total for hygiene/brief change as well as changing gown. Pt increased difficulty with motor planning this AM and requires total +2 for supine to sit with strong resistance and pushing posteriorly. Pt demonstrates no righting reactions. Total assis to maintain balance EOB and attempt to place slideboard. Pt continuing with strong posterior push and attempt to let pt "lose balance" to see if he could correct, but he was unable. Total +2 for slideboard transfer to w/c with facilitation for anterior weightshift and hand placement. Repositioned with +2 assist and safety belt donned. Pt demonstrates poor insight into deficits and decreased initiation. Half lap tray positioned for better LUE support.   Therapy Documentation Precautions:  Precautions Precautions: Fall Precaution Comments: dense L hemi with inability to initiate  motor control with R hemibody, easily distracted; pusher syndrome Restrictions Weight Bearing Restrictions: No  Pain:  Reports intermittent low back pain - improved with respositioning in the bed.     Therapy/Group: Individual Therapy  Karolee Stamps Darrol Poke, PT, DPT, CBIS  12/09/2022, 9:08 AM

## 2022-12-09 NOTE — Progress Notes (Signed)
Patient ID: Samuel Pearson, male   DOB: 02/22/1946, 77 y.o.   MRN: [id number]  Sw informed that spouse has completed admission paperwork. SW will arrange transport after 10 AM.

## 2022-12-09 NOTE — Progress Notes (Addendum)
Patient ID: JARMAINE EHRLER, male   DOB: 1945/11/07, 77 y.o.   MRN: [id number]  Sw spoke with spouse to provide pt room number. Spouse requesting PTAR after lunch due to bringing the grandchildren to the hospital to see patient before transfer. Sw will arrange transport between 1-2 PM. SW expressed to spouse transportation could be delayed due to [facility] assisting with emergencies as priority.   Spouse will follow PTAR via car. No additional questions or concerns.  10:18 AM: Patient transport arranged for 1 PM after lunch. D/c packet left at nursing station.

## 2022-12-09 NOTE — Plan of Care (Signed)
  Problem: RH Balance Goal: LTG: Patient will maintain dynamic sitting balance (OT) Description: LTG:  Patient will maintain dynamic sitting balance with assistance during activities of daily living (OT) Outcome: Adequate for Discharge Goal: LTG Patient will maintain dynamic standing with ADLs (OT) Description: LTG:  Patient will maintain dynamic standing balance with assist during activities of daily living (OT)  Outcome: Adequate for Discharge   Problem: Sit to Stand Goal: LTG:  Patient will perform sit to stand in prep for activites of daily living with assistance level (OT) Description: LTG:  Patient will perform sit to stand in prep for activites of daily living with assistance level (OT) Outcome: Adequate for Discharge   Problem: RH Eating Goal: LTG Patient will perform eating w/assist, cues/equip (OT) Description: LTG: Patient will perform eating with assist, with/without cues using equipment (OT) Outcome: Completed/Met   Problem: RH Grooming Goal: LTG Patient will perform grooming w/assist,cues/equip (OT) Description: LTG: Patient will perform grooming with assist, with/without cues using equipment (OT) Outcome: Completed/Met   Problem: RH Bathing Goal: LTG Patient will bathe all body parts with assist levels (OT) Description: LTG: Patient will bathe all body parts with assist levels (OT) Outcome: Adequate for Discharge   Problem: RH Dressing Goal: LTG Patient will perform upper body dressing (OT) Description: LTG Patient will perform upper body dressing with assist, with/without cues (OT). Outcome: Adequate for Discharge Goal: LTG Patient will perform lower body dressing w/assist (OT) Description: LTG: Patient will perform lower body dressing with assist, with/without cues in positioning using equipment (OT) Outcome: Adequate for Discharge   Problem: RH Functional Use of Upper Extremity Goal: LTG Patient will use RT/LT upper extremity as a (OT) Description: LTG: Patient  will use right/left upper extremity as a stabilizer/gross assist/diminished/nondominant/dominant level with assist, with/without cues during functional activity (OT) Outcome: Completed/Met   Problem: RH Memory Goal: LTG Patient will demonstrate ability for day to day recall/carry over during activities of daily living with assistance level (OT) Description: LTG:  Patient will demonstrate ability for day to day recall/carry over during activities of daily living with assistance level (OT). Outcome: Completed/Met   Problem: RH Awareness Goal: LTG: Patient will demonstrate awareness during functional activites type of (OT) Description: LTG: Patient will demonstrate awareness during functional activites type of (OT) Outcome: Not Met (add Reason)

## 2022-12-09 NOTE — Progress Notes (Signed)
Speech Language Pathology Discharge Summary  Patient Details  Name: Samuel Pearson MRN: [id number] Date of Birth: 1945/05/31  Date of Discharge from SLP service:December 09, 2022  Today's Date: 12/09/2022 SLP Individual Time: 4098-1191 SLP Individual Time Calculation (min): 33 min   Skilled Therapeutic Interventions:   SLP re-administered a standardized assessment (Cognistat) to assess cognitive-linguistic function. Patient with significant improvements from prior. This date, patient scored WFL on orientation, repetition, naming, memory, calculation, and reasoning/executive functioning. Patient presented with mild impairments in attention. Compared to prior, patient with improvements in verbal processing time, orientation (mild deficits to [facility]), memory, (mild deficits to [facility]) problem solving,(mild deficits to [facility]), repetition (mild deficits to [facility]), and visual construction tasks (severe to mild impairment). Patient has also demonstrated improved L attention. SLP session was concluded due to arrival of transport to SNF.     Patient has met 5 of 5 long term goals.  Patient to discharge at overall Supervision level.  Reasons goals not met: n/a   Clinical Impression/Discharge Summary:  Patient has made excellent gains and has met 5/5 LTG's this admission due to improved cognition and verbal processing time. Patient continues to required supervision A for cognitive tasks including attention, awareness, orientation, memory, and problem solving. Pt/family education complete and patient will d/c to a SNF with 24 hour assistance. Pt would benefit from continued ST services to maximize functional independence.   Care Partner:  Caregiver Able to Provide Assistance: Yes  Type of Caregiver Assistance: Cognitive;Physical  Recommendation:  Skilled Nursing facility  Rationale for SLP Follow Up: Maximize cognitive function and independence   Equipment: n/a   Reasons for discharge: Discharged from  hospital   Patient/Family Agrees with Progress Made and Goals Achieved: Yes    [name] M.A., CF-SLP 12/09/2022, 4:28 PM

## 2022-12-09 NOTE — Plan of Care (Signed)
?  Problem: RH Cognition - SLP ?Goal: RH LTG Patient will demonstrate orientation with cues ?Description:  LTG:  Patient will demonstrate orientation to person/place/time/situation with cues (SLP)   ?Outcome: Completed/Met ?  ?Problem: RH Problem Solving ?Goal: LTG Patient will demonstrate problem solving for (SLP) ?Description: LTG:  Patient will demonstrate problem solving for basic/complex daily situations with cues  (SLP) ?Outcome: Completed/Met ?  ?Problem: RH Memory ?Goal: LTG Patient will use memory compensatory aids to (SLP) ?Description: LTG:  Patient will use memory compensatory aids to recall biographical/new, daily complex information with cues (SLP) ?Outcome: Completed/Met ?  ?Problem: RH Attention ?Goal: LTG Patient will demonstrate this level of attention during functional activites (SLP) ?Description: LTG:  Patient will will demonstrate this level of attention during functional activites (SLP) ?Outcome: Completed/Met ?  ?Problem: RH Awareness ?Goal: LTG: Patient will demonstrate awareness during functional activites type of (SLP) ?Description: LTG: Patient will demonstrate awareness during functional activites type of (SLP) ?Outcome: Completed/Met ?  ?

## 2022-12-09 NOTE — Progress Notes (Signed)
Inpatient Rehabilitation Discharge Medication Review by a Pharmacist  A complete drug regimen review was completed for this patient to identify any potential clinically significant medication issues.  High Risk Drug Classes Is patient taking? Indication by Medication  Antipsychotic No   Anticoagulant No   Antibiotic No   Opioid No   Antiplatelet Yes clopidogrel - stroke ppx  Hypoglycemics/insulin No   Vasoactive Medication Yes Lisinopril - HTN  Chemotherapy No   Other Yes Folic acid, MVI - supplement Methocarbamol - PRN muscle spasms Pantoprazole - GERD ppx Atorvastatin - HLD Methylphenidate - attention     Type of Medication Issue Identified Description of Issue Recommendation(s)  Drug Interaction(s) (clinically significant)     Duplicate Therapy     Allergy     No Medication Administration End Date     Incorrect Dose     Additional Drug Therapy Needed     Significant med changes from prior encounter (inform family/care partners about these prior to discharge). Metoprolol, aspirin stopped Communicate medication changes with patient/family at discharge  Other       Clinically significant medication issues were identified that warrant physician communication and completion of prescribed/recommended actions by midnight of the next day:  Yes  Name of provider notified for urgent issues identified: Dan    Provider Method of Notification: secure chat    Pharmacist comments:  Per neurology recommendations, continue DAPT with aspirin and clopidogrel for 21 days, then clopidogrel  Confirmed with Jesusita Oka, will continue clopidogrel at discharge and stop aspirin   Time spent performing this drug regimen review (minutes): 20   Thank you for allowing pharmacy to be a part of this patient's care.  Thelma Barge, PharmD Clinical Pharmacist

## 2022-12-09 NOTE — Progress Notes (Signed)
Physical Therapy Session Note  Patient Details  Name: Samuel Pearson MRN: [id number] Date of Birth: 07/14/1945  Today's Date: 12/09/2022 PT Individual Time: 1304-1400 PT Individual Time Calculation (min): 56 min   Short Term Goals: Week 3:  PT Short Term Goal 1 (Week 3): Pt will perform all aspects of bed mobility with consistent MaxA. PT Short Term Goal 2 (Week 3): Pt will perform sit<>stand with MaxA +1 PT Short Term Goal 3 (Week 3): Pt will demo improved ability to initiate toward midline without need for external cues for reaching. Will initiate >20% of attempts. PT Short Term Goal 4 (Week 3): Pt will initiate sitting balance and maintain with MaxA+1. PT Short Term Goal 5 (Week 3): Pt will ambulate with MaxA +2 up to 20 steps.   Skilled Therapeutic Interventions/Progress Updates:  Patient seated upright in TIS w/c on entrance to room. Patient alert and agreeable to PT session. Wife present.   Patient with no pain complaint at start of session.  Related that transport will require pt to be in bed for ease of transfer from bed to transport gurney.   Therapeutic Activity: Transfers: Pt performed sit<>stand to STEDY with MaxA +2 requiring extensive vc for pull to bar vs pt's tendency to push away.  Allowed pt to count prior to transfer with slight improvement ability to bring self forward over past couple of days where he has required  increased assist. MaxA +2 for return to supine and neutral positioning. Pt assists with RLE push to [facility] and MaxA +2.   Wife provided with info re: R Hemisphere Disease and research being conducted by SLP from [facility]. Also provided with email re: sites to purchase STEDY for use at home if/ when pt returns home. Also includes info re: strap to use for L arm support d/t reduced musculature at shoulder.   Patient supine in bed at end of session with brakes locked, bed alarm set, and all needs within reach.   Therapy Documentation Precautions:   Precautions Precautions: Fall Precaution Comments: dense L hemi with inability to initiate motor control with R hemibody, easily distracted; pusher syndrome Restrictions Weight Bearing Restrictions: No Pain:  No pain related this session.  Balance: Static Sitting Balance Static Sitting - Level of Assistance: 2: Max assist Dynamic Sitting Balance Sitting balance - Comments: Heavy lean posteriorly to the  L with severely decreased balance strategies. Heavy push to the L and needs maxA to maintain midline while sitting with max cues to bring self over to midline.  Therapy/Group: Individual Therapy  [name] PT, DPT, CSRS 12/09/2022, 6:08 PM

## 2022-12-09 NOTE — Progress Notes (Signed)
Patient ID: Samuel Pearson, male   DOB: 10/17/1945, 77 y.o.   MRN: [id number]  SW informed by facility AD at [facility] that currently they only have one private ST bed remaining. Facility would like to go ahead and plan for a transfer today so patient is able to secure a private room. AD requesting spouse to visit the facility to complete admissions paperwork.  Sw informed spouse. Spouse getting dressed to heads over to the facility to complete admission paperwork.   Plan for SNF transfer today.

## 2022-12-09 NOTE — Progress Notes (Signed)
PROGRESS NOTE   Subjective/Complaints:  Having BMs loose  Taking clear liquids , order written to advance , will resume full diet HDPD , off IVF and reglan  ROS:No abd pain , no N/V/D     Objective:   No results found. No results for input(s): "WBC", "HGB", "HCT", "PLT" in the last 72 hours.     No results for input(s): "NA", "K", "CL", "CO2", "GLUCOSE", "BUN", "CREATININE", "CALCIUM" in the last 72 hours.      Intake/Output Summary (Last 24 hours) at 12/09/2022 0718 Last data filed at 12/08/2022 1247 Gross per 24 hour  Intake 354 ml  Output --  Net 354 ml        Physical Exam: Vital Signs Blood pressure (!) 155/60, pulse 81, temperature 98.7 F (37.1 C), temperature source Oral, resp. rate 18, height 5\' 6"  (1.676 m), weight 67.8 kg, SpO2 95%.    General: No acute distress Mood and affect are appropriate Heart: Regular rate and rhythm no rubs murmurs or extra sounds Lungs: Clear to auscultation, breathing unlabored, no rales or wheezes Abdomen: reduced bowel sounds, soft nontender to palpation, mildly distended Extremities: No clubbing, cyanosis, or edema Skin: No evidence of breakdown, no evidence of rash Neurologic: alert, delayed processing and engagement.,  limited verbal output, can respond to basic questions with extra time and repetition--- often answers with thumbs up to questions + left hemi-neglect is improving. MMT: 4/5 RUE and RLE. 0-tr/5 left deltoid, bicep, tricep, grip, hip flexor, knee extensors, ankle dorsiflexor and plantar flexors.   Sensory exam normal sensation to LT in bilateral upper and lower extremities Musculoskeletal: Full range of motion in all 4 extremities. No joint swelling   Assessment/Plan: 1. Functional deficits which require 3+ hours per day of interdisciplinary therapy in a comprehensive inpatient rehab setting. Physiatrist is providing close team supervision and 24 hour  management of active medical problems listed below. Physiatrist and rehab team continue to assess barriers to discharge/monitor patient progress toward functional and medical goals  Care Tool:  Bathing    Body parts bathed by patient: Chest, Abdomen, Face, Left arm, Right upper leg   Body parts bathed by helper: Right arm, Front perineal area, Buttocks, Left lower leg, Right upper leg     Bathing assist Assist Level: Moderate Assistance - Patient 50 - 74%     Upper Body Dressing/Undressing Upper body dressing   What is the patient wearing?: Pull over shirt    Upper body assist Assist Level: Maximal Assistance - Patient 25 - 49%    Lower Body Dressing/Undressing Lower body dressing      What is the patient wearing?: Incontinence brief, Pants     Lower body assist Assist for lower body dressing: 2 Helpers     Toileting Toileting    Toileting assist Assist for toileting: Dependent - Patient 0%     Transfers Chair/bed transfer  Transfers assist  Chair/bed transfer activity did not occur: Safety/medical concerns  Chair/bed transfer assist level: Dependent - mechanical lift     Locomotion Ambulation   Ambulation assist   Ambulation activity did not occur: Safety/medical concerns          Walk 10  feet activity   Assist  Walk 10 feet activity did not occur: Safety/medical concerns        Walk 50 feet activity   Assist Walk 50 feet with 2 turns activity did not occur: Safety/medical concerns         Walk 150 feet activity   Assist Walk 150 feet activity did not occur: Safety/medical concerns         Walk 10 feet on uneven surface  activity   Assist Walk 10 feet on uneven surfaces activity did not occur: Safety/medical concerns         Wheelchair     Assist Is the patient using a wheelchair?: Yes Type of Wheelchair: Manual Wheelchair activity did not occur: Safety/medical concerns  Wheelchair assist level:  Supervision/Verbal cueing (Extensive cues for safety awarness, technique, to stay on task, and sequence) Max wheelchair distance: 100    Wheelchair 50 feet with 2 turns activity    Assist    Wheelchair 50 feet with 2 turns activity did not occur: Safety/medical concerns   Assist Level: Maximal Assistance - Patient 25 - 49%   Wheelchair 150 feet activity     Assist  Wheelchair 150 feet activity did not occur: Safety/medical concerns       Blood pressure (!) 155/60, pulse 81, temperature 98.7 F (37.1 C), temperature source Oral, resp. rate 18, height 5\' 6"  (1.676 m), weight 67.8 kg, SpO2 95%.  Medical Problem List and Plan: 1. 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  Antithrombotics: -DVT/anticoagulation:  Pharmaceutical:             left tib and peroneal DVTs, started on Eliquis, pt asymptomatic ,Repeat doppler showing resolution of calf DVT cont Lovenox - no f/u doppler needed             -antiplatelet therapy: Aspirin and Plavix for three weeks followed by Plavix alone.  Last dose of aspirin will be 7/29   -7/23 resumed plavix without any problems   -7/27 discussed benefits of Lovenox, will continue and patient agreeable 3. Pain Management: Tylenol as needed   4. Mood/Behavior/Sleep: LCSW to evaluate and provide emotional support             -antipsychotic agents: n/a Add low dose ritalin for initiation issues---7/25 increase to 10mg     5. Neuropsych/cognition: This patient is not capable of making decisions on his own behalf.             -pt has hx of ETOH abuse, 2-3 beers + wine most nights 6. Skin/Wound  Care -maculopapular rash--likely d/t b12  -rash resolved with steroid taper and discontinuance of B12 supp    7. Fluid nutrition- meal intake variable 25-100%, po fluids ,    8: Hypertension: monitor TID and prn (home meds: lisinopril, Lopressor)               Vitals:   12/08/22 2026 12/09/22 1308  BP: (!) 142/54 (!) 155/60  Pulse: 84 81  Resp: 16 18  Temp: 97.7 F (36.5 C) 98.7 F (37.1 C)  SpO2: 100% 95%    --mild persistent systolic elevation- acceptable range  -7/25 pt on amlodipine 10mg , resume lisinopril at 5mg  qd              9: Hyperlipidemia: continue statin   10: Prediabetes: ? diet controlled; A1c = 5.7%   11: Macrocytic anemia/B12 deficiency: follow-up CBC- anemia resolved with supplementation              -hx of pernicious anemia; PCP rec: daily B12      Latest Ref Rng & Units 12/05/2022    6:11 AM 11/28/2022    7:42 AM 11/21/2022    6:27 AM  CBC  WBC 4.0 - 10.5 K/uL 7.4  8.1  9.7   Hemoglobin 13.0 - 17.0 g/dL 56.2  13.0  86.5   Hematocrit 39.0 - 52.0 % 40.5  40.0  40.6   Platelets 150 - 400 K/uL 260  272  283    12. 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 Thyroid nodule-TFT's essentially WNL             -f/u as outpt, thyroid U/S, etc.    14.  Mild adynamic ileus improving, multiple soft and liquid BMs, good intalke of clear liquids, wil advance diet to full  off IVF and  reglan LOS: 25 days A FACE TO FACE EVALUATION WAS PERFORMED  Erick Colace 12/09/2022, 7:18 AM

## 2022-12-09 NOTE — Progress Notes (Signed)
Patient ID: Samuel Pearson, male   DOB: 08/24/1945, 77 y.o.   MRN: [id number]  Patient transporting to room # 513.  Phone number for nurse report [phone].

## 2022-12-09 NOTE — Plan of Care (Signed)
Remains incontinent with toileting protocol at discharge

## 2022-12-09 NOTE — Progress Notes (Signed)
Patient ID: Samuel Pearson, male   DOB: 12/20/1945, 77 y.o.   MRN: [id number]  SW met with patient and spouse, patient ready for transport. Patient being moved to the HB. Pt's custom WC will be delivered to the SNF. No additional questions or concerns.

## 2022-12-09 NOTE — Progress Notes (Signed)
Called report to [facility] at this time.

## 2022-12-09 NOTE — Progress Notes (Signed)
Inpatient Rehabilitation Care Coordinator Discharge Note   Patient Details  Name: Samuel Pearson MRN: [id number] Date of Birth: 1946-04-03   Discharge location: SNF [facility])  Length of Stay: 25 Days  Discharge activity level: Max/Total  Home/community participation: Spouse and daughter  Patient response WU:JWJXBJ Literacy - How often do you need to have someone help you when you read instructions, pamphlets, or other written material from your doctor or pharmacy?: Often  Patient response YN:WGNFAO Isolation - How often do you feel lonely or isolated from those around you?: Never  Services provided included: MD, RD, PT, OT, SLP, RN, CM, TR, Pharmacy, Neuropsych, SW  Financial Services:  Field seismologist Utilized: Private Insurance [facility] Medicare  Choices offered to/list presented to: Patient and Spouse  Follow-up services arranged:  Other (Comment) (SNF)           Patient response to transportation need: Is the patient able to respond to transportation needs?: Yes In the past 12 months, has lack of transportation kept you from medical appointments or from getting medications?: No In the past 12 months, has lack of transportation kept you from meetings, work, or from getting things needed for daily living?: No   Patient/Family verbalized understanding of follow-up arrangements:  Yes  Individual responsible for coordination of the follow-up plan: Joam [phone]  Confirmed correct DME delivered: Andria Rhein 12/09/2022    Comments (or additional information):  Summary of Stay    Date/Time Discharge Planning CSW  12/06/22 1445 Family has decided to move forward with SNF placement due to pt's level of care needs. Auth approved. SW waiting on family to determine of [facility] or [facility] . CJB  11/29/22 1506 Discjarging home with spouse and daughter. Daughter unable to assist daily. Spouse and daughter currently in the discussion of HC VS. SNF based on  LOC. CJB  11/22/22 1430 Discharging home with spouse who plans to assist and supervise 24/7. Barrier: Level of care and carefiver support. CJB  11/15/22 1121 Discharging home with spouse able to assist some. Providing supervision 24/7. CJB       Andria Rhein

## 2022-12-09 NOTE — Progress Notes (Signed)
Occupational Therapy Session Note  Patient Details  Name: Samuel Pearson MRN: [id number] Date of Birth: 07-03-1945  Today's Date: 12/09/2022 OT Individual Time: 1030-1100 OT Individual Time Calculation (min): 30 min    Skilled Therapeutic Interventions/Progress Updates:    1:1Pt received in the w/c. Pt with strong left lean today in w/c. Engaged in LB dressing while in the w/c with focus on trunk posture at midline and achieving and sustaining a forward weight shift. Engaged pt in obtaining clothing from right field to promote weight shift to the right. And then transition to leaning forward to thread then. Pt with strong pushing to the left through his foot with difficulty transitioning out of it. Pt able to achieve forward weight shift but then would go back into resting in posterior pelvic tilt- difficulty sustaining neutral without pushing. Attempted to stand but ultimately required total A  +2 to come into standing and then +2 to pull up pants. Even in rest in sitting position in w/c today pt continuing to push backwards and to the left. Pt left tilted back into the liberty w/c for safety with safety belt and call bell in hand.  Therapy Documentation Precautions:  Precautions Precautions: Fall Precaution Comments: dense L hemi with inability to initiate motor control with R hemibody, easily distracted; pusher syndrome Restrictions Weight Bearing Restrictions: No  Pain:  No c/o pain in session   Therapy/Group: Individual Therapy  Roney [facility] 12/09/2022, 3:26 PM

## 2022-12-10 NOTE — Plan of Care (Signed)
Problem: RH Balance Goal: LTG Patient will maintain dynamic sitting balance (PT) Description: LTG:  Patient will maintain dynamic sitting balance with assistance during mobility activities (PT) Outcome: Adequate for Discharge Flowsheets (Taken 12/10/2022 1840) LTG: Pt will maintain dynamic sitting balance during mobility activities with:: Maximal Assistance - Patient 25 - 49% Note: Adjustments to LTGs not saved in Epic system. Pt is adequate for d/c as he is head for continued rehab in SNF.  Goal: LTG Patient will maintain dynamic standing balance (PT) Description: LTG:  Patient will maintain dynamic standing balance with assistance during mobility activities (PT) Outcome: Adequate for Discharge Flowsheets (Taken 12/10/2022 1840) LTG: Pt will maintain dynamic standing balance during mobility activities with:: Total Assistance - Patient < 25% Note: Adjustments to LTGs not saved in Epic system. Pt is adequate for d/c as he is head for continued rehab in SNF.    Problem: Sit to Stand Goal: LTG:  Patient will perform sit to stand with assistance level (PT) Description: LTG:  Patient will perform sit to stand with assistance level (PT) Outcome: Adequate for Discharge Flowsheets (Taken 12/10/2022 1840) LTG: PT will perform sit to stand in preparation for functional mobility with assistance level: Total Assistance - Patient < 25% Note: Adjustments to LTGs not saved in Epic system. Pt is adequate for d/c as he is head for continued rehab in SNF.    Problem: RH Bed Mobility Goal: LTG Patient will perform bed mobility with assist (PT) Description: LTG: Patient will perform bed mobility with assistance, with/without cues (PT). Outcome: Adequate for Discharge Flowsheets (Taken 12/10/2022 1840) LTG: Pt will perform bed mobility with assistance level of: Total Assistance - Patient < 25% Note: Adjustments to LTGs not saved in Epic system. Pt is adequate for d/c as he is head for continued rehab in SNF.     Problem: RH Bed to Chair Transfers Goal: LTG Patient will perform bed/chair transfers w/assist (PT) Description: LTG: Patient will perform bed to chair transfers with assistance (PT). Outcome: Adequate for Discharge Flowsheets (Taken 12/10/2022 1840) LTG: Pt will perform Bed to Chair Transfers with assistance level: Dependent - Patient equals 0% Note: Adjustments to LTGs not saved in Epic system. Pt is adequate for d/c as he is head for continued rehab in SNF.    Problem: RH Ambulation Goal: LTG Patient will ambulate in controlled environment (PT) Description: LTG: Patient will ambulate in a controlled environment, # of feet with assistance (PT). Outcome: Adequate for Discharge Flowsheets (Taken 12/10/2022 1840) LTG: Pt will ambulate in controlled environ  assist needed:: Total Assistance - Patient < 25% LTG: Ambulation distance in controlled environment: up to 6 steps using 3 musketeer technique Note: Adjustments to LTGs not saved in Epic system. Pt is adequate for d/c as he is head for continued rehab in SNF.    Problem: RH Wheelchair Mobility Goal: LTG Patient will propel w/c in controlled environment (PT) Description: LTG: Patient will propel wheelchair in controlled environment, # of feet with assist (PT) Outcome: Completed/Met Flowsheets Taken 12/10/2022 1840 LTG: Pt will propel w/c in controlled environ  assist needed:: Supervision/Verbal cueing Taken 11/15/2022 1229 LTG: Propel w/c distance in controlled environment: 100 ft Note: Requires extensive, consistent cues for attn to task.  Goal: LTG Patient will propel w/c in home environment (PT) Description: LTG: Patient will propel wheelchair in home environment, # of feet with assistance (PT). Outcome: Completed/Met Flowsheets (Taken 11/15/2022 1229) LTG: Pt will propel w/c in home environ  assist needed:: Minimal Assistance - Patient > 75%  Distance: wheelchair distance in controlled environment: 100 LTG: Propel w/c distance in home  environment: 30 ft Note: Requires extensive, consistent vc for attn to task d/t high distractibility.    Problem: RH Car Transfers Goal: LTG Patient will perform car transfers with assist (PT) Description: LTG: Patient will perform car transfers with assistance (PT). Outcome: Not Applicable Note: Adjustments to LTGs not saved in Epic system. Pt is adequate for d/c as he is head for continued rehab in SNF.

## 2022-12-12 DIAGNOSIS — I69320 Aphasia following cerebral infarction: Secondary | ICD-10-CM | POA: Diagnosis not present

## 2022-12-12 DIAGNOSIS — I69354 Hemiplegia and hemiparesis following cerebral infarction affecting left non-dominant side: Secondary | ICD-10-CM | POA: Diagnosis not present

## 2022-12-12 DIAGNOSIS — I69391 Dysphagia following cerebral infarction: Secondary | ICD-10-CM | POA: Diagnosis not present

## 2022-12-12 DIAGNOSIS — M6281 Muscle weakness (generalized): Secondary | ICD-10-CM | POA: Diagnosis not present

## 2022-12-12 DIAGNOSIS — R2689 Other abnormalities of gait and mobility: Secondary | ICD-10-CM | POA: Diagnosis not present

## 2022-12-12 DIAGNOSIS — Z9181 History of falling: Secondary | ICD-10-CM | POA: Diagnosis not present

## 2022-12-13 DIAGNOSIS — I10 Essential (primary) hypertension: Secondary | ICD-10-CM | POA: Diagnosis not present

## 2022-12-13 DIAGNOSIS — E785 Hyperlipidemia, unspecified: Secondary | ICD-10-CM | POA: Diagnosis not present

## 2022-12-13 DIAGNOSIS — M6281 Muscle weakness (generalized): Secondary | ICD-10-CM | POA: Diagnosis not present

## 2022-12-13 DIAGNOSIS — K219 Gastro-esophageal reflux disease without esophagitis: Secondary | ICD-10-CM | POA: Diagnosis not present

## 2022-12-15 ENCOUNTER — Encounter: Payer: Self-pay | Admitting: Neurology

## 2022-12-15 ENCOUNTER — Other Ambulatory Visit: Payer: Self-pay | Admitting: *Deleted

## 2022-12-15 DIAGNOSIS — Z9181 History of falling: Secondary | ICD-10-CM | POA: Diagnosis not present

## 2022-12-15 DIAGNOSIS — I69320 Aphasia following cerebral infarction: Secondary | ICD-10-CM | POA: Diagnosis not present

## 2022-12-15 DIAGNOSIS — I69354 Hemiplegia and hemiparesis following cerebral infarction affecting left non-dominant side: Secondary | ICD-10-CM | POA: Diagnosis not present

## 2022-12-15 DIAGNOSIS — R2689 Other abnormalities of gait and mobility: Secondary | ICD-10-CM | POA: Diagnosis not present

## 2022-12-15 DIAGNOSIS — M6281 Muscle weakness (generalized): Secondary | ICD-10-CM | POA: Diagnosis not present

## 2022-12-15 DIAGNOSIS — I69391 Dysphagia following cerebral infarction: Secondary | ICD-10-CM | POA: Diagnosis not present

## 2022-12-15 NOTE — Patient Outreach (Signed)
Late entry for 12/14/22  Samuel Pearson resides in [facility] skilled nursing facility.  Collaboration with [facility] psychotherapist. Samuel Pearson is from home with wife. Anticipate transition plan is for LTC.  Will continue to follow while Samuel Pearson resides in SNF.   Raiford Noble, MSN, RN,BSN [facility] Post Acute Care Coordinator [phone] (Direct dial)

## 2022-12-16 DIAGNOSIS — M6281 Muscle weakness (generalized): Secondary | ICD-10-CM | POA: Diagnosis not present

## 2022-12-16 DIAGNOSIS — I10 Essential (primary) hypertension: Secondary | ICD-10-CM | POA: Diagnosis not present

## 2022-12-16 DIAGNOSIS — I69354 Hemiplegia and hemiparesis following cerebral infarction affecting left non-dominant side: Secondary | ICD-10-CM | POA: Diagnosis not present

## 2022-12-16 DIAGNOSIS — K219 Gastro-esophageal reflux disease without esophagitis: Secondary | ICD-10-CM | POA: Diagnosis not present

## 2022-12-19 ENCOUNTER — Ambulatory Visit (INDEPENDENT_AMBULATORY_CARE_PROVIDER_SITE_OTHER): Payer: Medicare Other

## 2022-12-19 DIAGNOSIS — I10 Essential (primary) hypertension: Secondary | ICD-10-CM | POA: Diagnosis not present

## 2022-12-19 DIAGNOSIS — E785 Hyperlipidemia, unspecified: Secondary | ICD-10-CM | POA: Diagnosis not present

## 2022-12-19 DIAGNOSIS — I69320 Aphasia following cerebral infarction: Secondary | ICD-10-CM | POA: Diagnosis not present

## 2022-12-19 DIAGNOSIS — I69354 Hemiplegia and hemiparesis following cerebral infarction affecting left non-dominant side: Secondary | ICD-10-CM | POA: Diagnosis not present

## 2022-12-19 DIAGNOSIS — M6281 Muscle weakness (generalized): Secondary | ICD-10-CM | POA: Diagnosis not present

## 2022-12-19 DIAGNOSIS — I63521 Cerebral infarction due to unspecified occlusion or stenosis of right anterior cerebral artery: Secondary | ICD-10-CM | POA: Diagnosis not present

## 2022-12-19 DIAGNOSIS — R2689 Other abnormalities of gait and mobility: Secondary | ICD-10-CM | POA: Diagnosis not present

## 2022-12-19 DIAGNOSIS — I69391 Dysphagia following cerebral infarction: Secondary | ICD-10-CM | POA: Diagnosis not present

## 2022-12-19 DIAGNOSIS — Z9181 History of falling: Secondary | ICD-10-CM | POA: Diagnosis not present

## 2022-12-19 DIAGNOSIS — K219 Gastro-esophageal reflux disease without esophagitis: Secondary | ICD-10-CM | POA: Diagnosis not present

## 2022-12-22 DIAGNOSIS — I69320 Aphasia following cerebral infarction: Secondary | ICD-10-CM | POA: Diagnosis not present

## 2022-12-22 DIAGNOSIS — I69391 Dysphagia following cerebral infarction: Secondary | ICD-10-CM | POA: Diagnosis not present

## 2022-12-22 DIAGNOSIS — M6281 Muscle weakness (generalized): Secondary | ICD-10-CM | POA: Diagnosis not present

## 2022-12-22 DIAGNOSIS — R2689 Other abnormalities of gait and mobility: Secondary | ICD-10-CM | POA: Diagnosis not present

## 2022-12-22 DIAGNOSIS — Z9181 History of falling: Secondary | ICD-10-CM | POA: Diagnosis not present

## 2022-12-22 DIAGNOSIS — K219 Gastro-esophageal reflux disease without esophagitis: Secondary | ICD-10-CM | POA: Diagnosis not present

## 2022-12-22 DIAGNOSIS — I10 Essential (primary) hypertension: Secondary | ICD-10-CM | POA: Diagnosis not present

## 2022-12-22 DIAGNOSIS — I69354 Hemiplegia and hemiparesis following cerebral infarction affecting left non-dominant side: Secondary | ICD-10-CM | POA: Diagnosis not present

## 2022-12-23 DIAGNOSIS — I69354 Hemiplegia and hemiparesis following cerebral infarction affecting left non-dominant side: Secondary | ICD-10-CM | POA: Diagnosis not present

## 2022-12-23 DIAGNOSIS — M6281 Muscle weakness (generalized): Secondary | ICD-10-CM | POA: Diagnosis not present

## 2022-12-23 DIAGNOSIS — I10 Essential (primary) hypertension: Secondary | ICD-10-CM | POA: Diagnosis not present

## 2022-12-26 DIAGNOSIS — I69320 Aphasia following cerebral infarction: Secondary | ICD-10-CM | POA: Diagnosis not present

## 2022-12-26 DIAGNOSIS — R2689 Other abnormalities of gait and mobility: Secondary | ICD-10-CM | POA: Diagnosis not present

## 2022-12-26 DIAGNOSIS — M6281 Muscle weakness (generalized): Secondary | ICD-10-CM | POA: Diagnosis not present

## 2022-12-26 DIAGNOSIS — Z9181 History of falling: Secondary | ICD-10-CM | POA: Diagnosis not present

## 2022-12-26 DIAGNOSIS — I69391 Dysphagia following cerebral infarction: Secondary | ICD-10-CM | POA: Diagnosis not present

## 2022-12-26 DIAGNOSIS — I69354 Hemiplegia and hemiparesis following cerebral infarction affecting left non-dominant side: Secondary | ICD-10-CM | POA: Diagnosis not present

## 2022-12-29 DIAGNOSIS — R2689 Other abnormalities of gait and mobility: Secondary | ICD-10-CM | POA: Diagnosis not present

## 2022-12-29 DIAGNOSIS — I69320 Aphasia following cerebral infarction: Secondary | ICD-10-CM | POA: Diagnosis not present

## 2022-12-29 DIAGNOSIS — Z9181 History of falling: Secondary | ICD-10-CM | POA: Diagnosis not present

## 2022-12-29 DIAGNOSIS — I69354 Hemiplegia and hemiparesis following cerebral infarction affecting left non-dominant side: Secondary | ICD-10-CM | POA: Diagnosis not present

## 2022-12-29 DIAGNOSIS — M6281 Muscle weakness (generalized): Secondary | ICD-10-CM | POA: Diagnosis not present

## 2022-12-29 DIAGNOSIS — I69391 Dysphagia following cerebral infarction: Secondary | ICD-10-CM | POA: Diagnosis not present

## 2023-01-02 DIAGNOSIS — I69320 Aphasia following cerebral infarction: Secondary | ICD-10-CM | POA: Diagnosis not present

## 2023-01-02 DIAGNOSIS — I69391 Dysphagia following cerebral infarction: Secondary | ICD-10-CM | POA: Diagnosis not present

## 2023-01-02 DIAGNOSIS — Z9181 History of falling: Secondary | ICD-10-CM | POA: Diagnosis not present

## 2023-01-02 DIAGNOSIS — M6281 Muscle weakness (generalized): Secondary | ICD-10-CM | POA: Diagnosis not present

## 2023-01-02 DIAGNOSIS — R2689 Other abnormalities of gait and mobility: Secondary | ICD-10-CM | POA: Diagnosis not present

## 2023-01-02 DIAGNOSIS — I69354 Hemiplegia and hemiparesis following cerebral infarction affecting left non-dominant side: Secondary | ICD-10-CM | POA: Diagnosis not present

## 2023-01-03 NOTE — Progress Notes (Signed)
Carelink Summary Report / Loop Recorder 

## 2023-01-05 DIAGNOSIS — I69320 Aphasia following cerebral infarction: Secondary | ICD-10-CM | POA: Diagnosis not present

## 2023-01-05 DIAGNOSIS — Z9181 History of falling: Secondary | ICD-10-CM | POA: Diagnosis not present

## 2023-01-05 DIAGNOSIS — M6281 Muscle weakness (generalized): Secondary | ICD-10-CM | POA: Diagnosis not present

## 2023-01-05 DIAGNOSIS — I69354 Hemiplegia and hemiparesis following cerebral infarction affecting left non-dominant side: Secondary | ICD-10-CM | POA: Diagnosis not present

## 2023-01-05 DIAGNOSIS — R2689 Other abnormalities of gait and mobility: Secondary | ICD-10-CM | POA: Diagnosis not present

## 2023-01-05 DIAGNOSIS — I69391 Dysphagia following cerebral infarction: Secondary | ICD-10-CM | POA: Diagnosis not present

## 2023-01-06 DIAGNOSIS — I10 Essential (primary) hypertension: Secondary | ICD-10-CM | POA: Diagnosis not present

## 2023-01-06 DIAGNOSIS — I69354 Hemiplegia and hemiparesis following cerebral infarction affecting left non-dominant side: Secondary | ICD-10-CM | POA: Diagnosis not present

## 2023-01-06 DIAGNOSIS — E785 Hyperlipidemia, unspecified: Secondary | ICD-10-CM | POA: Diagnosis not present

## 2023-01-11 ENCOUNTER — Other Ambulatory Visit: Payer: Self-pay | Admitting: *Deleted

## 2023-01-11 DIAGNOSIS — I10 Essential (primary) hypertension: Secondary | ICD-10-CM | POA: Diagnosis not present

## 2023-01-11 DIAGNOSIS — D649 Anemia, unspecified: Secondary | ICD-10-CM | POA: Diagnosis not present

## 2023-01-11 DIAGNOSIS — I639 Cerebral infarction, unspecified: Secondary | ICD-10-CM | POA: Diagnosis not present

## 2023-01-11 DIAGNOSIS — R278 Other lack of coordination: Secondary | ICD-10-CM | POA: Diagnosis not present

## 2023-01-11 DIAGNOSIS — E039 Hypothyroidism, unspecified: Secondary | ICD-10-CM | POA: Diagnosis not present

## 2023-01-11 DIAGNOSIS — E785 Hyperlipidemia, unspecified: Secondary | ICD-10-CM | POA: Diagnosis not present

## 2023-01-11 DIAGNOSIS — M6281 Muscle weakness (generalized): Secondary | ICD-10-CM | POA: Diagnosis not present

## 2023-01-11 NOTE — Patient Outreach (Signed)
Per [facility] Samuel Pearson discharged from [facility] skilled nursing facility on 01/06/23. Screening for potential care coordination services as benefit of health plan and Primary Care Provider.  Confirmed with [facility] social worker, Samuel Pearson transitioned to [facility] for long term care.   No identifiable care coordination needs.   Raiford Noble, MSN, RN,BSN Post Acute Care Coordinator [phone] (Direct dial)

## 2023-01-12 DIAGNOSIS — I10 Essential (primary) hypertension: Secondary | ICD-10-CM | POA: Diagnosis not present

## 2023-01-12 DIAGNOSIS — D510 Vitamin B12 deficiency anemia due to intrinsic factor deficiency: Secondary | ICD-10-CM | POA: Diagnosis not present

## 2023-01-12 DIAGNOSIS — G8194 Hemiplegia, unspecified affecting left nondominant side: Secondary | ICD-10-CM | POA: Diagnosis not present

## 2023-01-12 DIAGNOSIS — R278 Other lack of coordination: Secondary | ICD-10-CM | POA: Diagnosis not present

## 2023-01-12 DIAGNOSIS — I639 Cerebral infarction, unspecified: Secondary | ICD-10-CM | POA: Diagnosis not present

## 2023-01-12 DIAGNOSIS — E785 Hyperlipidemia, unspecified: Secondary | ICD-10-CM | POA: Diagnosis not present

## 2023-01-12 DIAGNOSIS — M6281 Muscle weakness (generalized): Secondary | ICD-10-CM | POA: Diagnosis not present

## 2023-01-13 DIAGNOSIS — M6281 Muscle weakness (generalized): Secondary | ICD-10-CM | POA: Diagnosis not present

## 2023-01-13 DIAGNOSIS — R278 Other lack of coordination: Secondary | ICD-10-CM | POA: Diagnosis not present

## 2023-01-13 DIAGNOSIS — I639 Cerebral infarction, unspecified: Secondary | ICD-10-CM | POA: Diagnosis not present

## 2023-01-16 DIAGNOSIS — R278 Other lack of coordination: Secondary | ICD-10-CM | POA: Diagnosis not present

## 2023-01-16 DIAGNOSIS — M6281 Muscle weakness (generalized): Secondary | ICD-10-CM | POA: Diagnosis not present

## 2023-01-16 DIAGNOSIS — I639 Cerebral infarction, unspecified: Secondary | ICD-10-CM | POA: Diagnosis not present

## 2023-01-17 DIAGNOSIS — R278 Other lack of coordination: Secondary | ICD-10-CM | POA: Diagnosis not present

## 2023-01-17 DIAGNOSIS — M6281 Muscle weakness (generalized): Secondary | ICD-10-CM | POA: Diagnosis not present

## 2023-01-17 DIAGNOSIS — I639 Cerebral infarction, unspecified: Secondary | ICD-10-CM | POA: Diagnosis not present

## 2023-01-18 DIAGNOSIS — I639 Cerebral infarction, unspecified: Secondary | ICD-10-CM | POA: Diagnosis not present

## 2023-01-18 DIAGNOSIS — M6281 Muscle weakness (generalized): Secondary | ICD-10-CM | POA: Diagnosis not present

## 2023-01-18 DIAGNOSIS — R278 Other lack of coordination: Secondary | ICD-10-CM | POA: Diagnosis not present

## 2023-01-19 DIAGNOSIS — I639 Cerebral infarction, unspecified: Secondary | ICD-10-CM | POA: Diagnosis not present

## 2023-01-19 DIAGNOSIS — M6281 Muscle weakness (generalized): Secondary | ICD-10-CM | POA: Diagnosis not present

## 2023-01-19 DIAGNOSIS — R278 Other lack of coordination: Secondary | ICD-10-CM | POA: Diagnosis not present

## 2023-01-20 DIAGNOSIS — R278 Other lack of coordination: Secondary | ICD-10-CM | POA: Diagnosis not present

## 2023-01-20 DIAGNOSIS — M6281 Muscle weakness (generalized): Secondary | ICD-10-CM | POA: Diagnosis not present

## 2023-01-20 DIAGNOSIS — I639 Cerebral infarction, unspecified: Secondary | ICD-10-CM | POA: Diagnosis not present

## 2023-01-22 ENCOUNTER — Other Ambulatory Visit: Payer: Self-pay | Admitting: Family Medicine

## 2023-01-23 ENCOUNTER — Ambulatory Visit (INDEPENDENT_AMBULATORY_CARE_PROVIDER_SITE_OTHER): Payer: Medicare Other

## 2023-01-23 DIAGNOSIS — M6281 Muscle weakness (generalized): Secondary | ICD-10-CM | POA: Diagnosis not present

## 2023-01-23 DIAGNOSIS — R278 Other lack of coordination: Secondary | ICD-10-CM | POA: Diagnosis not present

## 2023-01-23 DIAGNOSIS — I63521 Cerebral infarction due to unspecified occlusion or stenosis of right anterior cerebral artery: Secondary | ICD-10-CM

## 2023-01-23 DIAGNOSIS — I639 Cerebral infarction, unspecified: Secondary | ICD-10-CM | POA: Diagnosis not present

## 2023-01-24 DIAGNOSIS — R278 Other lack of coordination: Secondary | ICD-10-CM | POA: Diagnosis not present

## 2023-01-24 DIAGNOSIS — I639 Cerebral infarction, unspecified: Secondary | ICD-10-CM | POA: Diagnosis not present

## 2023-01-24 DIAGNOSIS — M6281 Muscle weakness (generalized): Secondary | ICD-10-CM | POA: Diagnosis not present

## 2023-01-24 LAB — CUP PACEART REMOTE DEVICE CHECK
Date Time Interrogation Session: 20240913203609
Implantable Pulse Generator Implant Date: 20240708

## 2023-01-25 DIAGNOSIS — I639 Cerebral infarction, unspecified: Secondary | ICD-10-CM | POA: Diagnosis not present

## 2023-01-25 DIAGNOSIS — M6281 Muscle weakness (generalized): Secondary | ICD-10-CM | POA: Diagnosis not present

## 2023-01-25 DIAGNOSIS — R278 Other lack of coordination: Secondary | ICD-10-CM | POA: Diagnosis not present

## 2023-01-26 DIAGNOSIS — I639 Cerebral infarction, unspecified: Secondary | ICD-10-CM | POA: Diagnosis not present

## 2023-01-26 DIAGNOSIS — R278 Other lack of coordination: Secondary | ICD-10-CM | POA: Diagnosis not present

## 2023-01-26 DIAGNOSIS — M6281 Muscle weakness (generalized): Secondary | ICD-10-CM | POA: Diagnosis not present

## 2023-01-27 ENCOUNTER — Encounter: Payer: Medicare Other | Attending: Physical Medicine & Rehabilitation | Admitting: Physical Medicine & Rehabilitation

## 2023-01-27 ENCOUNTER — Encounter: Payer: Self-pay | Admitting: Physical Medicine & Rehabilitation

## 2023-01-27 VITALS — BP 128/67 | HR 99 | Ht 66.0 in

## 2023-01-27 DIAGNOSIS — G8114 Spastic hemiplegia affecting left nondominant side: Secondary | ICD-10-CM | POA: Diagnosis not present

## 2023-01-27 NOTE — Patient Instructions (Signed)
Have recommended getting a new Left wrist - Hand orthosis "resting hand splint"    Will do botulinum toxin injection next visit

## 2023-01-27 NOTE — Progress Notes (Signed)
Subjective:  Admit date: 11/14/2022 Discharge date: 12/09/2022  Patient ID: Samuel Pearson, male    DOB: 1945-05-23, 77 y.o.   MRN: [id number]  77 y.o. male who persented to [facility] ED as code stroke on 11/08/2022. He awoke that morning and attempted to walk to the bathroom but fell because of left-sided weakness. His wife noted an episode of bowel incontinence. Allergy consulted and was outside of the window for tenecteplase. He was admitted to the hospitalist service. Was reassessed later in the afternoon and noted to have twitching/jerking movements in bilateral lower extremities left worse than right. CT of the head without acute findings. 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 No hemorrhage. MRI also performed of the complete spine but limited due to motion artifact. Pete CT head was performed as well as EEG. CTA of the head with intracranial atherosclerotic disease with multifocal stenoses, most notably of a moderate degree of the left paraclinoid ICA. 2D echo with EF of 60 to 65% and left atrial dilation. Hemoglobin A1c 5.7%. He was started on aspirin and Plavix for 3 weeks followed by Plavix alone. Tolerating diet. BP controlled. Neurosurgery, Dr. Conchita Paris consulted about cervical spinal stenosis and felt finding to be chronic and recommended outpatient follow-up once he is off Plavix. Did not feel like there is utility for steroid either.      Hospital Course: Samuel Pearson was admitted to rehab 11/14/2022 for inpatient therapies to consist of PT, ST and OT at least three hours five days a week. Past admission physiatrist, therapy team and rehab RN have worked together to provide customized collaborative inpatient rehab. Follow-up labs on 7/09 stable. Left calf DVT noted on 7/09 on screening venous duplex. Follow-up duplex ordered. More alert 7/15. Response time still delayed but  mildly improved. Discussed with SLP no swallowing issues. Repeat venus duplex showed resolution of thrombus of left gastroc vein and remains in left peroneal veins. Ileus resolved 7/16. Diffuse skin rash noted on 7/18. Started Benadryl and prednisone. Rash persisted and Plavix held on 7/21 and B12 discontinued. Resumed Plavix 7/22. Walked 12 steps on 7/23. Steroids tapered.  Pertaining to patient's right ACA infarction remained stable and follow-up neurology services.  Mood stabilization with low-dose Ritalin.  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 After leaving the inpatient rehabilitation unit he went to the [facility] skilled nursing facility.  The patient and his wife desired to be at [facility] skilled nursing facility and was transferred there approximately 3 weeks ago.  He is receiving PT OT and speech therapy there.  He continues to be wheelchair-bound he uses a walker with therapy only.  He requires help for transfers.  He requires help for dressing and bathing.  He is married and his wife is with him today. He lost his resting hand splint during the transfer from [facility] skilled nursing facility to [place] skilled nursing facility. Pain Inventory Average Pain 2 Pain Right Now 1 My pain is aching  LOCATION OF PAIN  fingers  BOWEL Number of stools per week: 3  Incontinent Yes   BLADDER Normal    Mobility use a walker ability to climb steps?  no do you drive?  yes use a wheelchair needs help with transfers Do  you have any goals in this area?  yes  Function not employed: date last employed . I need assistance with the following:  dressing  Neuro/Psych No problems in this area  Prior Studies Any changes since last visit?  no  Physicians involved in your care Any changes since last visit?   no   Family History  Problem Relation Age of Onset   Arthritis Mother    Hypertension Mother    Arthritis Father    Hypertension Father    Social History   Socioeconomic History   Marital status: Married    Spouse name: Not on file   Number of children: Not on file   Years of education: Not on file   Highest education level: Not on file  Occupational History   Not on file  Tobacco Use   Smoking status: Never   Smokeless tobacco: Never  Vaping Use   Vaping status: Never Used  Substance and Sexual Activity   Alcohol use: Yes    Alcohol/week: 14.0 - 21.0 standard drinks of alcohol    Types: 14 - 21 Cans of beer per week   Drug use: No   Sexual activity: Not on file  Other Topics Concern   Not on file  Social History Narrative   Retired IT trainer from [place]   Married   Some Caffeine   Helps take care of grandkids   recently moved residence    Social Determinants of Corporate investment banker Strain: Not on file  Food Insecurity: No Food Insecurity (11/08/2022)   Hunger Vital Sign    Worried About Running Out of Food in the Last Year: Never true    Ran Out of Food in the Last Year: Never true  Transportation Needs: No Transportation Needs (11/08/2022)   PRAPARE - Administrator, Civil Service (Medical): No    Lack of Transportation (Non-Medical): No  Physical Activity: Not on file  Stress: Not on file  Social Connections: Not on file   Past Surgical History:  Procedure Laterality Date   CATARACT EXTRACTION  2009   [facility]   CHOLECYSTECTOMY N/A 02/25/2020   Procedure: LAPAROSCOPIC CHOLECYSTECTOMY;  Surgeon: Harriette Bouillon, MD;  Location: [facility] OR;  Service: General;  Laterality: N/A;   LOOP RECORDER INSERTION N/A 11/14/2022   Procedure: LOOP RECORDER INSERTION;  Surgeon: Maurice Small, MD;  Location: [facility] INVASIVE CV LAB;  Service: Cardiovascular;  Laterality: N/A;   Past Medical History:  Diagnosis Date    Allergic rhinitis    Anxiety    Detached retina    Heart murmur    has had one when a child and checked out ok  never? 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  elevated MCV  No other sx    Ht 5\' 6"  (1.676 m)   BMI 24.13 kg/m   Opioid Risk Score:   Fall Risk Score:  `1  Depression screen [facility] 2/9     01/18/2022   10:04 AM 11/16/2021   10:27 AM 11/18/2020    3:35 PM 11/01/2019    9:41 AM 10/30/2018    9:03 AM 10/27/2017    8:32 AM 10/21/2016    1:14 PM  Depression screen PHQ 2/9  Decreased Interest 0 0  0  0 0 0  Down, Depressed, Hopeless 0 0  0 0 0 0  PHQ - 2 Score 0 0  0 0 0 0  Altered sleeping 0 0 0 0     Tired, decreased energy 1 0 1 0     Change in appetite 0 0  0     Feeling bad or failure about yourself  0 0 0 0     Trouble concentrating 0 0 0 0     Moving slowly or fidgety/restless 0 0 0 0     Suicidal thoughts 0 0 0 0     PHQ-9 Score 1 0  0     Difficult doing work/chores Not difficult at all Not difficult at all  Not difficult at all         Review of Systems  All other systems reviewed and are negative.     Objective:   Physical Exam Vitals and nursing note reviewed.  Constitutional:      Appearance: He is normal weight.  HENT:     Head: Normocephalic and atraumatic.  Eyes:     Extraocular Movements: Extraocular movements intact.     Conjunctiva/sclera: Conjunctivae normal.     Pupils: Pupils are equal, round, and reactive to light.  Musculoskeletal:     Right lower leg: No edema.     Left lower leg: Edema present.     Comments: 1+ pretibial edema left lower extremity No pain with left lower extremity range of motion Negative Homans No evidence of knee effusion  Left shoulder with decreased active and passive range of motion left elbow with decreased active and passive range of motion There is pain with  extension of the finger flexors on the left hand there is no evidence of joint swelling or deformity no erythema at the hand or fingers.  Skin:    General: Skin is warm and dry.  Neurological:     Mental Status: He is alert and oriented to person, place, and time.     Comments: Motor strength is to minus finger flexion extension in the left upper extremity there is no active elbow and shoulder range of motion there may be some pain inhibition there is significantly increased muscle tone. Tone MAS 3 at the left shoulder MAS 3 at the left elbow flexors MAS 2/3 at the finger flexors and wrist flexor left upper extremity thumb flexor is normal. Left lower extremity tone MAS 2 at the knee flexors No evidence of clonus at the ankle  Psychiatric:        Mood and Affect: Mood normal.        Behavior: Behavior normal.   Motor strength is 0/5 in the left lower limb  Patient has increased latency of response He is able to ample answers simple questions about his medical status. He does appear to have adequate attention and concentration during the examination.      Assessment & Plan:  #1.  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 Continue PT OT efforts. Would recommend botulinum toxin injection either incobotulinum toxin or abobotulinum toxin  Left upper extremity trunk Pectoralis 75 units Left upper extremity elbow flexors Biceps 50 units Brachialis 50 units Brachial radialis 50 units Left wrist flexors FCR 25 units Left finger flexors FDS 25 units FDP 25 units

## 2023-01-30 DIAGNOSIS — I639 Cerebral infarction, unspecified: Secondary | ICD-10-CM | POA: Diagnosis not present

## 2023-01-30 DIAGNOSIS — M6281 Muscle weakness (generalized): Secondary | ICD-10-CM | POA: Diagnosis not present

## 2023-01-30 DIAGNOSIS — R278 Other lack of coordination: Secondary | ICD-10-CM | POA: Diagnosis not present

## 2023-01-31 DIAGNOSIS — R278 Other lack of coordination: Secondary | ICD-10-CM | POA: Diagnosis not present

## 2023-01-31 DIAGNOSIS — M6281 Muscle weakness (generalized): Secondary | ICD-10-CM | POA: Diagnosis not present

## 2023-01-31 DIAGNOSIS — I639 Cerebral infarction, unspecified: Secondary | ICD-10-CM | POA: Diagnosis not present

## 2023-02-01 DIAGNOSIS — R278 Other lack of coordination: Secondary | ICD-10-CM | POA: Diagnosis not present

## 2023-02-01 DIAGNOSIS — I639 Cerebral infarction, unspecified: Secondary | ICD-10-CM | POA: Diagnosis not present

## 2023-02-01 DIAGNOSIS — M6281 Muscle weakness (generalized): Secondary | ICD-10-CM | POA: Diagnosis not present

## 2023-02-02 DIAGNOSIS — I639 Cerebral infarction, unspecified: Secondary | ICD-10-CM | POA: Diagnosis not present

## 2023-02-02 DIAGNOSIS — R278 Other lack of coordination: Secondary | ICD-10-CM | POA: Diagnosis not present

## 2023-02-02 DIAGNOSIS — M6281 Muscle weakness (generalized): Secondary | ICD-10-CM | POA: Diagnosis not present

## 2023-02-03 DIAGNOSIS — M6281 Muscle weakness (generalized): Secondary | ICD-10-CM | POA: Diagnosis not present

## 2023-02-03 DIAGNOSIS — R278 Other lack of coordination: Secondary | ICD-10-CM | POA: Diagnosis not present

## 2023-02-03 DIAGNOSIS — I639 Cerebral infarction, unspecified: Secondary | ICD-10-CM | POA: Diagnosis not present

## 2023-02-06 DIAGNOSIS — I639 Cerebral infarction, unspecified: Secondary | ICD-10-CM | POA: Diagnosis not present

## 2023-02-06 DIAGNOSIS — R278 Other lack of coordination: Secondary | ICD-10-CM | POA: Diagnosis not present

## 2023-02-06 DIAGNOSIS — M6281 Muscle weakness (generalized): Secondary | ICD-10-CM | POA: Diagnosis not present

## 2023-02-07 ENCOUNTER — Encounter (HOSPITAL_COMMUNITY): Payer: Self-pay

## 2023-02-07 ENCOUNTER — Encounter: Payer: Medicare Other | Admitting: Internal Medicine

## 2023-02-07 ENCOUNTER — Emergency Department (HOSPITAL_COMMUNITY): Payer: Medicare Other

## 2023-02-07 ENCOUNTER — Emergency Department (HOSPITAL_COMMUNITY)
Admission: EM | Admit: 2023-02-07 | Discharge: 2023-02-08 | Disposition: A | Discharge status: Skilled Nursing Facility | Payer: Medicare Other | Attending: Emergency Medicine | Admitting: Emergency Medicine

## 2023-02-07 DIAGNOSIS — I639 Cerebral infarction, unspecified: Secondary | ICD-10-CM | POA: Diagnosis not present

## 2023-02-07 DIAGNOSIS — R6889 Other general symptoms and signs: Secondary | ICD-10-CM | POA: Diagnosis not present

## 2023-02-07 DIAGNOSIS — R5383 Other fatigue: Secondary | ICD-10-CM | POA: Diagnosis not present

## 2023-02-07 DIAGNOSIS — R0989 Other specified symptoms and signs involving the circulatory and respiratory systems: Secondary | ICD-10-CM | POA: Diagnosis not present

## 2023-02-07 DIAGNOSIS — I6782 Cerebral ischemia: Secondary | ICD-10-CM | POA: Diagnosis not present

## 2023-02-07 DIAGNOSIS — Z1152 Encounter for screening for COVID-19: Secondary | ICD-10-CM | POA: Insufficient documentation

## 2023-02-07 DIAGNOSIS — Z79899 Other long term (current) drug therapy: Secondary | ICD-10-CM | POA: Insufficient documentation

## 2023-02-07 DIAGNOSIS — R278 Other lack of coordination: Secondary | ICD-10-CM | POA: Diagnosis not present

## 2023-02-07 DIAGNOSIS — E785 Hyperlipidemia, unspecified: Secondary | ICD-10-CM | POA: Diagnosis not present

## 2023-02-07 DIAGNOSIS — R4182 Altered mental status, unspecified: Secondary | ICD-10-CM | POA: Insufficient documentation

## 2023-02-07 DIAGNOSIS — G8194 Hemiplegia, unspecified affecting left nondominant side: Secondary | ICD-10-CM | POA: Diagnosis not present

## 2023-02-07 DIAGNOSIS — M6281 Muscle weakness (generalized): Secondary | ICD-10-CM | POA: Diagnosis not present

## 2023-02-07 DIAGNOSIS — I10 Essential (primary) hypertension: Secondary | ICD-10-CM | POA: Insufficient documentation

## 2023-02-07 DIAGNOSIS — Z743 Need for continuous supervision: Secondary | ICD-10-CM | POA: Diagnosis not present

## 2023-02-07 DIAGNOSIS — G9389 Other specified disorders of brain: Secondary | ICD-10-CM | POA: Diagnosis not present

## 2023-02-07 DIAGNOSIS — R531 Weakness: Secondary | ICD-10-CM | POA: Diagnosis not present

## 2023-02-07 DIAGNOSIS — R1312 Dysphagia, oropharyngeal phase: Secondary | ICD-10-CM | POA: Diagnosis not present

## 2023-02-07 DIAGNOSIS — R499 Unspecified voice and resonance disorder: Secondary | ICD-10-CM | POA: Diagnosis not present

## 2023-02-07 DIAGNOSIS — Z8673 Personal history of transient ischemic attack (TIA), and cerebral infarction without residual deficits: Secondary | ICD-10-CM | POA: Diagnosis not present

## 2023-02-07 DIAGNOSIS — D510 Vitamin B12 deficiency anemia due to intrinsic factor deficiency: Secondary | ICD-10-CM | POA: Diagnosis not present

## 2023-02-07 DIAGNOSIS — R569 Unspecified convulsions: Secondary | ICD-10-CM | POA: Diagnosis not present

## 2023-02-07 DIAGNOSIS — R918 Other nonspecific abnormal finding of lung field: Secondary | ICD-10-CM | POA: Diagnosis not present

## 2023-02-07 DIAGNOSIS — R9089 Other abnormal findings on diagnostic imaging of central nervous system: Secondary | ICD-10-CM | POA: Diagnosis not present

## 2023-02-07 LAB — URINALYSIS, ROUTINE W REFLEX MICROSCOPIC
Bilirubin Urine: NEGATIVE
Glucose, UA: NEGATIVE mg/dL
Hgb urine dipstick: NEGATIVE
Ketones, ur: NEGATIVE mg/dL
Leukocytes,Ua: NEGATIVE
Nitrite: NEGATIVE
Protein, ur: NEGATIVE mg/dL
Specific Gravity, Urine: 1.018 (ref 1.005–1.030)
pH: 5 (ref 5.0–8.0)

## 2023-02-07 LAB — CBC
HCT: 38.9 % — ABNORMAL LOW (ref 39.0–52.0)
Hemoglobin: 12.8 g/dL — ABNORMAL LOW (ref 13.0–17.0)
MCH: 31.1 pg (ref 26.0–34.0)
MCHC: 32.9 g/dL (ref 30.0–36.0)
MCV: 94.6 fL (ref 80.0–100.0)
Platelets: 329 10*3/uL (ref 150–400)
RBC: 4.11 MIL/uL — ABNORMAL LOW (ref 4.22–5.81)
RDW: 13.3 % (ref 11.5–15.5)
WBC: 5.8 10*3/uL (ref 4.0–10.5)
nRBC: 0 % (ref 0.0–0.2)

## 2023-02-07 LAB — CBG MONITORING, ED: Glucose-Capillary: 115 mg/dL — ABNORMAL HIGH (ref 70–99)

## 2023-02-07 LAB — RAPID URINE DRUG SCREEN, HOSP PERFORMED
Amphetamines: NOT DETECTED
Barbiturates: NOT DETECTED
Benzodiazepines: NOT DETECTED
Cocaine: NOT DETECTED
Opiates: NOT DETECTED
Tetrahydrocannabinol: NOT DETECTED

## 2023-02-07 LAB — COMPREHENSIVE METABOLIC PANEL
ALT: 31 U/L (ref 0–44)
AST: 26 U/L (ref 15–41)
Albumin: 3.2 g/dL — ABNORMAL LOW (ref 3.5–5.0)
Alkaline Phosphatase: 70 U/L (ref 38–126)
Anion gap: 14 (ref 5–15)
BUN: 9 mg/dL (ref 8–23)
CO2: 21 mmol/L — ABNORMAL LOW (ref 22–32)
Calcium: 8.8 mg/dL — ABNORMAL LOW (ref 8.9–10.3)
Chloride: 104 mmol/L (ref 98–111)
Creatinine, Ser: 0.55 mg/dL — ABNORMAL LOW (ref 0.61–1.24)
GFR, Estimated: >60 mL/min (ref >60)
Glucose, Bld: 128 mg/dL — ABNORMAL HIGH (ref 70–99)
Potassium: 4.1 mmol/L (ref 3.5–5.1)
Sodium: 139 mmol/L (ref 135–145)
Total Bilirubin: 0.7 mg/dL (ref 0.3–1.2)
Total Protein: 5.9 g/dL — ABNORMAL LOW (ref 6.5–8.1)

## 2023-02-07 LAB — SARS CORONAVIRUS 2 BY RT PCR: SARS Coronavirus 2 by RT PCR: NEGATIVE

## 2023-02-07 MED ORDER — SODIUM CHLORIDE 0.9 % IV BOLUS
1000.0000 mL | Freq: Once | INTRAVENOUS | Status: AC
  Administered 2023-02-07: 1000 mL via INTRAVENOUS

## 2023-02-07 NOTE — ED Triage Notes (Signed)
Lives in [facility]. Sent here for fatigue, weakness. Pt has been sleeping mostly since this AM. Usually uses a wheelchair. Recent stroke in July 2024 with  left sided residual. Able to answer questions.  EMS VS:  124/54 HR 80 RR 18 CBG 120

## 2023-02-07 NOTE — ED Provider Notes (Signed)
Center EMERGENCY DEPARTMENT AT [facility] Provider Note   CSN: [id number] Arrival date & time: 02/07/23  1718     History  Chief Complaint  Patient presents with   Fatigue    Samuel Pearson is a 77 y.o. male with history of hypertension, hyperlipidemia, pernicious anemia, thyroid disease, aortic regurg, CVA s/p left-sided residual deficits, currently living at skilled nursing facility, who presents the emergency department for altered mental status.  History obtained per family at bedside and staff at facility.  Family reports that they were visiting the patient earlier today around 1500 as they do every day, and patient was having difficulty waking up.  They pushed him around the Courtyard in a wheelchair, and he still did not wake up. Did notice him "shaking" his left side where he has deficits. Patient uses wheelchair at baseline.  She mentioned the problem to a physical therapist who had to sternum rub to wake him up.  At that time he was having difficulty answering questions, slurred speech, and was very lethargic.  They brought him for evaluation to the nurse practitioner around 1600, and they had concern for possible TIA.  Once EMS arrived patient was able to follow basic commands, good grip strength on the right side. Physical therapy had also reported that patient seemed more "groggy" yesterday when they were trying to wake him up to.   Per wife, patient's baseline is normally he can have conversations speaking in short sentences.   Level 5 caveat due to AMS  HPI     Home Medications Prior to Admission medications   Medication Sig Start Date End Date Taking? Authorizing Provider  acetaminophen (TYLENOL) 325 MG tablet Take 1-2 tablets (325-650 mg total) by mouth every 4 (four) hours as needed for mild pain. 12/09/22   Angiulli, Mcarthur Rossetti, PA-C  amLODipine (NORVASC) 10 MG tablet Take 10 mg by mouth daily. 01/06/23   [provider]  atorvastatin (LIPITOR)  40 MG tablet Take 1 tablet (40 mg total) by mouth daily. 11/15/22   Almon Hercules, MD  clopidogrel (PLAVIX) 75 MG tablet Take 1 tablet (75 mg total) by mouth daily. 11/14/22   Almon Hercules, MD  folic acid (FOLVITE) 1 MG tablet Take 1 tablet (1 mg total) by mouth daily. 12/10/22   Angiulli, Mcarthur Rossetti, PA-C  lisinopril (ZESTRIL) 10 MG tablet Take 1 tablet (10 mg total) by mouth daily. 12/10/22   Angiulli, Mcarthur Rossetti, PA-C  methocarbamol (ROBAXIN) 500 MG tablet Take 1 tablet (500 mg total) by mouth every 6 (six) hours as needed for muscle spasms. 12/09/22   Angiulli, Mcarthur Rossetti, PA-C  methylphenidate (RITALIN) 10 MG tablet Take 1 tablet (10 mg total) by mouth 2 (two) times daily with breakfast and lunch. 12/09/22   Angiulli, Mcarthur Rossetti, PA-C  metoprolol tartrate (LOPRESSOR) 25 MG tablet Take 25 mg by mouth 2 (two) times daily. 01/17/23   [provider]  Multiple Vitamin (MULTIVITAMIN WITH MINERALS) TABS tablet Take 1 tablet by mouth daily.    [provider]  pantoprazole (PROTONIX) 40 MG tablet Take 1 tablet (40 mg total) by mouth daily. 12/10/22   Angiulli, Mcarthur Rossetti, PA-C      Allergies    Cinnamon, Other, Pollen extract-tree extract [pollen extract], and B-12 [cyanocobalamin]    Review of Systems   Review of Systems  Unable to perform ROS: Mental status change    Physical Exam Updated Vital Signs BP 129/67   Pulse 78  Temp (!) 97.4 F (36.3 C) (Axillary)   Resp 17   SpO2 96%  Physical Exam Vitals and nursing note reviewed.  Constitutional:      Appearance: Normal appearance.  HENT:     Head: Normocephalic and atraumatic.  Eyes:     Conjunctiva/sclera: Conjunctivae normal.     Comments: Bilateral miosis  Cardiovascular:     Rate and Rhythm: Normal rate and regular rhythm.  Pulmonary:     Effort: Pulmonary effort is normal. No respiratory distress.     Comments: Diffuse adventitious lung sounds, no focal consolidation Abdominal:     General: There is no distension.      Palpations: Abdomen is soft.     Tenderness: There is no abdominal tenderness.  Skin:    General: Skin is warm and dry.  Neurological:     General: No focal deficit present.     GCS: GCS eye subscore is 1. GCS verbal subscore is 1. GCS motor subscore is 6.     Comments: Alert to painful stimuli, can grip my hand and wiggle toes on the right. Chronic left sided deficits. No tremor appreciated.      ED Results / Procedures / Treatments   Labs (all labs ordered are listed, but only abnormal results are displayed) Labs Reviewed  CBC - Abnormal; Notable for the following components:      Result Value   RBC 4.11 (*)    Hemoglobin 12.8 (*)    HCT 38.9 (*)    All other components within normal limits  URINALYSIS, ROUTINE W REFLEX MICROSCOPIC - Abnormal; Notable for the following components:   APPearance HAZY (*)    All other components within normal limits  COMPREHENSIVE METABOLIC PANEL - Abnormal; Notable for the following components:   CO2 21 (*)    Glucose, Bld 128 (*)    Creatinine, Ser 0.55 (*)    Calcium 8.8 (*)    Total Protein 5.9 (*)    Albumin 3.2 (*)    All other components within normal limits  CBG MONITORING, ED - Abnormal; Notable for the following components:   Glucose-Capillary 115 (*)    All other components within normal limits  SARS CORONAVIRUS 2 BY RT PCR  RAPID URINE DRUG SCREEN, HOSP PERFORMED    EKG None  Radiology DG Chest Portable 1 View  Result Date: 02/07/2023 CLINICAL DATA:  Fatigue altered EXAM: PORTABLE CHEST 1 VIEW COMPARISON:  None Available. FINDINGS: Electronic device over left chest. Low lung volumes. Atelectasis versus mild infiltrate left lung base. Probable enlargement of the cardiac size. No pleural effusion or pneumothorax IMPRESSION: Low lung volumes with atelectasis versus mild infiltrate at the left base. Electronically Signed   By: Jasmine Pang M.D.   On: 02/07/2023 20:03   CT Head Wo Contrast  Result Date: 02/07/2023 CLINICAL  DATA:  Mental status change EXAM: CT HEAD WITHOUT CONTRAST TECHNIQUE: Contiguous axial images were obtained from the base of the skull through the vertex without intravenous contrast. RADIATION DOSE REDUCTION: This exam was performed according to the departmental dose-optimization program which includes automated exposure control, adjustment of the mA and/or kV according to patient size and/or use of iterative reconstruction technique. COMPARISON:  Head CT 11/08/2022, MRI 11/08/2022 FINDINGS: Brain: No hemorrhage or intracranial mass. Atrophy and extensive chronic small vessel ischemic changes of the white matter. Interval encephalomalacia at the right frontal lobe, extending to the vertex consistent with interval infarct. Mild ex vacuo dilatation of right frontal horn. Ventricles are otherwise stable  in appearance. Small chronic basal ganglial infarcts as before. Vascular: No hyperdense vessels.  Carotid vascular calcification Skull: Normal. Negative for fracture or focal lesion. Sinuses/Orbits: No acute finding. Other: None IMPRESSION: 1. No CT evidence for acute intracranial abnormality. 2. Atrophy and extensive chronic small vessel ischemic changes of the white matter. Interval encephalomalacia at the right frontal lobe consistent with interval infarct. Electronically Signed   By: Jasmine Pang M.D.   On: 02/07/2023 20:02    Procedures Procedures    Medications Ordered in ED Medications  sodium chloride 0.9 % bolus 1,000 mL (0 mLs Intravenous Stopped 02/07/23 2256)    ED Course/ Medical Decision Making/ A&P Clinical Course as of 02/07/23 2315  Tue Feb 07, 2023  1926 Stable  22 YOM with a history of CVA  Acute on chronic AMS [CC]    Clinical Course User Index [CC] Glyn Ade, MD                                 Medical Decision Making Amount and/or Complexity of Data Reviewed Labs: ordered. Radiology: ordered.   This patient is a 77 y.o. male  who presents to the ED for concern  of altered mental status.   Differential diagnoses prior to evaluation: The emergent differential diagnosis includes, but is not limited to,  Drug-related, hypoxia, hyper/hypoglycemia, encephalopathy, sepsis, DKA/HHS, brain lesion, CVA, seizure, environmental, psychiatric . This is not an exhaustive differential.   Past Medical History / Co-morbidities / Social History: hypertension, hyperlipidemia, pernicious anemia, thyroid disease, aortic regurg, CVA s/p left-sided residual deficits, currently living at skilled nursing facility,  Additional history: Chart reviewed. Pertinent results include: PDMP reviewed, no controlled substances dispensed in record.   Physical Exam: Physical exam performed. The pertinent findings include: Normal vital signs, GCS 6. Can grip with R hand, wiggle R toes. Diffuse adventitious lung sounds, no focal consolidation.  Patient with acute change during physician examination, see Dr Tacey Heap separate note for details.   Lab Tests/Imaging studies: I personally interpreted labs/imaging and the pertinent results include:  CBC and CMP at baseline. Urinalysis negative for hematuria of infection, UDS negative. COVID negative.   CXR with atelectasis vs mild infiltrate in left lung base. CT with no acute abnormalities, showed interval encephalomalacia at right frontal lobe consistent with interval infarct. I agree with the radiologist interpretation.  MRI pending at time of shift change.   Cardiac monitoring: EKG obtained and interpreted by myself and attending physician which shows: NSR   Medications: I ordered medication including IVF.  I have reviewed the patients home medicines and have made adjustments as needed.   Disposition: Patient discussed and care transferred to [facility] at shift change. Please see his/her note for further details regarding further ED course and disposition. Plan at time of handoff is follow up on MRI brain. Anticipate if stroke,  patient likely encephalopathic from interval stroke. If MRI normal, suspect possible focal seizures affecting L side. Patient would benefit from neurologic consultaiton and admission regardless of MRI findings. Feel ED workup has ruled out other etiologies of encephalopathy, such as metabolic or infectious.   I discussed this case with my attending physician Dr. Doran Durand who cosigned this note including patient's presenting symptoms, physical exam, and planned diagnostics and interventions. Attending physician stated agreement with plan or made changes to plan which were implemented.   Final Clinical Impression(s) / ED Diagnoses Final diagnoses:  Altered mental status, unspecified altered  mental status type    Rx / DC Orders ED Discharge Orders     None      Portions of this report may have been transcribed using voice recognition software. Every effort was made to ensure accuracy; however, inadvertent computerized transcription errors may be present.    Su Monks, PA-C 02/07/23 2315    Glyn Ade, MD 02/08/23 1505

## 2023-02-07 NOTE — ED Provider Notes (Signed)
I evaluated [name] at bedside as well. Per his family he has had fatigue and weakness and altered mental status all throughout the day.  He has underlying CVA and limited ability to communicate usually limited to short 2-3 word sentences and has base left-sided weakness. However, today he has been completely obtunded for much of the day with brief intervals of lucidity and very severe episodes of somnolence/lethargy.  During my evaluation at bedside, he is GCS 3 not responsive to any painful stimuli.  Interestingly, he appears to have rhythmic twitching of his left upper and lower extremity with 5-6 episodes per minute while he is in this state. I had a long conversation with the family regarding his presentation, differential includes neurologic etiology (split into either recurrent ischemic disease versus epileptic disease), infectious etiology including urine or pulmonary infection, or intoxicants such as his underlying Robaxin or other medication exposure at the facility though none are identified on his medication list. Neurologic disease is grossly favored.  CT head is performed without focal pathology except for possible age-indeterminate infarct.  MR is added on for further diagnostic stratification. If MRI shows that patient has had another infarction, anticipate his encephalopathy is from the infarction will likely need neurologic recommendations on management given his history of CVA. If MR is without another infarction, patient's presentation may be more consistent with an epileptiform presentation.  Will still require neurologic consultation for consideration for AEDs and reassessment of his mental status in the morning.   Glyn Ade, MD 02/07/23 2231

## 2023-02-07 NOTE — ED Notes (Signed)
In and out cath done using sterile technique. Dark yellow urine obtained and sent to lab.

## 2023-02-07 NOTE — ED Provider Notes (Signed)
Received at shift change from [facility]   Patient with medical history including hypertension, hyperlipidemia, aortic valve regurg, CVA with residual left-sided weakness, wheelchair-bound, presenting with altered mental status, unable to obtain history from patient as he is not able to provide very much but per previous provider, patient presents with varying mental status, typically at baseline he can talk 2-3 words, family visited him today as they always do noticed that he became very somnolent, was shaking on his left upper side, states that he has been more sleepy and drowsy than usual.  This is abnormal for him.  Per previous provider follow-up on MRI likely admit for further observation Physical Exam  BP 129/67   Pulse 78   Temp (!) 97.4 F (36.3 C) (Axillary)   Resp 17   SpO2 96%   Physical Exam Vitals and nursing note reviewed.  Constitutional:      General: He is not in acute distress.    Appearance: He is not ill-appearing.  HENT:     Head: Normocephalic and atraumatic.     Nose: No congestion.  Eyes:     Conjunctiva/sclera: Conjunctivae normal.  Cardiovascular:     Rate and Rhythm: Normal rate and regular rhythm.     Pulses: Normal pulses.     Heart sounds: No murmur heard.    No friction rub. No gallop.  Pulmonary:     Effort: No respiratory distress.     Breath sounds: No wheezing, rhonchi or rales.  Skin:    General: Skin is warm and dry.  Neurological:     Mental Status: He is alert.     Comments: Patient has left facial droop, left sided weakness and left upper and left lower, can follow two-step commands, he is oriented to person and place.  Psychiatric:        Mood and Affect: Mood normal.     Procedures  Procedures  ED Course / MDM   Clinical Course as of 02/07/23 2343  Tue Feb 07, 2023  1926 Stable  56 YOM with a history of CVA  Acute on chronic AMS [CC]    Clinical Course User Index [CC] Glyn Ade, MD   Medical Decision  Making Amount and/or Complexity of Data Reviewed Labs: ordered. Radiology: ordered.    Lab Tests:  I Ordered, and personally interpreted labs.  The pertinent results include: CBC shows normocytic anemia hemoglobin 12.8, CMP reveals CO2 of 21 glucose 128, creatinine 0.55 calcium 8.8, UA unremarkable CBG 115 urine drug screen negative, COVID-negative   Imaging Studies ordered:  I ordered imaging studies including chest x-ray, CT head, MRI without contrast I independently visualized and interpreted imaging which showed shows atelectasis versus infiltrates left lower lobe, CT head reveals possible right frontal lobe interval infarct I agree with the radiologist interpretation   Cardiac Monitoring:  The patient was maintained on a cardiac monitor.  I personally viewed and interpreted the cardiac monitored which showed an underlying rhythm of: Without signs of ischemia   Medicines ordered and prescription drug management:  I ordered medication including ***  for ***  I have reviewed the patients home medicines and have made adjustments as needed  Critical Interventions:  ***   Reevaluation:  Presents with varying degrees of altered mental status, on my exam he is found resting comfortably, vital signs are reassuring, able to answer basic questions, will continue to monitor.  Consultations Obtained:  I requested consultation with the ***,  and discussed lab and imaging findings  as well as pertinent plan - they recommend: ***    Test Considered:  ***    Rule out ****    Dispostion and problem list  After consideration of the diagnostic results and the patients response to treatment, I feel that the patent would benefit from ***.

## 2023-02-08 ENCOUNTER — Emergency Department (HOSPITAL_COMMUNITY): Payer: Medicare Other

## 2023-02-08 ENCOUNTER — Encounter: Payer: Self-pay | Admitting: Neurology

## 2023-02-08 DIAGNOSIS — Z7401 Bed confinement status: Secondary | ICD-10-CM | POA: Diagnosis not present

## 2023-02-08 DIAGNOSIS — R531 Weakness: Secondary | ICD-10-CM | POA: Diagnosis not present

## 2023-02-08 DIAGNOSIS — E785 Hyperlipidemia, unspecified: Secondary | ICD-10-CM | POA: Diagnosis not present

## 2023-02-08 DIAGNOSIS — R9089 Other abnormal findings on diagnostic imaging of central nervous system: Secondary | ICD-10-CM | POA: Diagnosis not present

## 2023-02-08 DIAGNOSIS — I6782 Cerebral ischemia: Secondary | ICD-10-CM | POA: Diagnosis not present

## 2023-02-08 DIAGNOSIS — D519 Vitamin B12 deficiency anemia, unspecified: Secondary | ICD-10-CM | POA: Diagnosis not present

## 2023-02-08 DIAGNOSIS — I10 Essential (primary) hypertension: Secondary | ICD-10-CM | POA: Diagnosis not present

## 2023-02-08 DIAGNOSIS — G9389 Other specified disorders of brain: Secondary | ICD-10-CM | POA: Diagnosis not present

## 2023-02-08 DIAGNOSIS — R499 Unspecified voice and resonance disorder: Secondary | ICD-10-CM | POA: Diagnosis not present

## 2023-02-08 DIAGNOSIS — M6281 Muscle weakness (generalized): Secondary | ICD-10-CM | POA: Diagnosis not present

## 2023-02-08 DIAGNOSIS — R1312 Dysphagia, oropharyngeal phase: Secondary | ICD-10-CM | POA: Diagnosis not present

## 2023-02-08 DIAGNOSIS — Z8673 Personal history of transient ischemic attack (TIA), and cerebral infarction without residual deficits: Secondary | ICD-10-CM | POA: Diagnosis not present

## 2023-02-08 DIAGNOSIS — R278 Other lack of coordination: Secondary | ICD-10-CM | POA: Diagnosis not present

## 2023-02-08 DIAGNOSIS — I639 Cerebral infarction, unspecified: Secondary | ICD-10-CM | POA: Diagnosis not present

## 2023-02-08 DIAGNOSIS — G8194 Hemiplegia, unspecified affecting left nondominant side: Secondary | ICD-10-CM | POA: Diagnosis not present

## 2023-02-08 MED ORDER — LEVETIRACETAM 500 MG PO TABS
500.0000 mg | ORAL_TABLET | Freq: Two times a day (BID) | ORAL | 0 refills | Status: AC
Start: 2023-02-08 — End: 2023-07-31

## 2023-02-08 MED ORDER — LEVETIRACETAM IN NACL 1500 MG/100ML IV SOLN
1500.0000 mg | Freq: Once | INTRAVENOUS | Status: AC
  Administered 2023-02-08: 1500 mg via INTRAVENOUS
  Filled 2023-02-08: qty 100

## 2023-02-08 NOTE — Progress Notes (Signed)
Carelink Summary Report / Loop Recorder 

## 2023-02-08 NOTE — Discharge Instructions (Signed)
I suspect your altered mental status was from your seizure, started on seizure medications please take your prescribed.  I would like to follow-up with neurology, I have sent an internal referral, they should call you within the next few weeks to schedule an appointment, if you do not hear from them please call them for a follow-up appointment.  Come back to the emergency department if you develop chest pain, shortness of breath, severe abdominal pain, uncontrolled nausea, vomiting, diarrhea.

## 2023-02-08 NOTE — ED Notes (Signed)
[facility] called x3 no answer, report will be given to transport

## 2023-02-09 DIAGNOSIS — R499 Unspecified voice and resonance disorder: Secondary | ICD-10-CM | POA: Diagnosis not present

## 2023-02-09 DIAGNOSIS — R1312 Dysphagia, oropharyngeal phase: Secondary | ICD-10-CM | POA: Diagnosis not present

## 2023-02-09 DIAGNOSIS — R278 Other lack of coordination: Secondary | ICD-10-CM | POA: Diagnosis not present

## 2023-02-09 DIAGNOSIS — M6281 Muscle weakness (generalized): Secondary | ICD-10-CM | POA: Diagnosis not present

## 2023-02-09 DIAGNOSIS — I639 Cerebral infarction, unspecified: Secondary | ICD-10-CM | POA: Diagnosis not present

## 2023-02-10 DIAGNOSIS — I639 Cerebral infarction, unspecified: Secondary | ICD-10-CM | POA: Diagnosis not present

## 2023-02-10 DIAGNOSIS — R1312 Dysphagia, oropharyngeal phase: Secondary | ICD-10-CM | POA: Diagnosis not present

## 2023-02-10 DIAGNOSIS — M6281 Muscle weakness (generalized): Secondary | ICD-10-CM | POA: Diagnosis not present

## 2023-02-10 DIAGNOSIS — R499 Unspecified voice and resonance disorder: Secondary | ICD-10-CM | POA: Diagnosis not present

## 2023-02-10 DIAGNOSIS — R278 Other lack of coordination: Secondary | ICD-10-CM | POA: Diagnosis not present

## 2023-02-13 DIAGNOSIS — R499 Unspecified voice and resonance disorder: Secondary | ICD-10-CM | POA: Diagnosis not present

## 2023-02-13 DIAGNOSIS — R1312 Dysphagia, oropharyngeal phase: Secondary | ICD-10-CM | POA: Diagnosis not present

## 2023-02-13 DIAGNOSIS — R278 Other lack of coordination: Secondary | ICD-10-CM | POA: Diagnosis not present

## 2023-02-13 DIAGNOSIS — E785 Hyperlipidemia, unspecified: Secondary | ICD-10-CM

## 2023-02-13 DIAGNOSIS — I10 Essential (primary) hypertension: Secondary | ICD-10-CM

## 2023-02-13 DIAGNOSIS — G8194 Hemiplegia, unspecified affecting left nondominant side: Secondary | ICD-10-CM

## 2023-02-13 DIAGNOSIS — I639 Cerebral infarction, unspecified: Secondary | ICD-10-CM

## 2023-02-13 DIAGNOSIS — D510 Vitamin B12 deficiency anemia due to intrinsic factor deficiency: Secondary | ICD-10-CM

## 2023-02-13 DIAGNOSIS — M6281 Muscle weakness (generalized): Secondary | ICD-10-CM | POA: Diagnosis not present

## 2023-02-14 DIAGNOSIS — R499 Unspecified voice and resonance disorder: Secondary | ICD-10-CM | POA: Diagnosis not present

## 2023-02-14 DIAGNOSIS — R278 Other lack of coordination: Secondary | ICD-10-CM | POA: Diagnosis not present

## 2023-02-14 DIAGNOSIS — R1312 Dysphagia, oropharyngeal phase: Secondary | ICD-10-CM | POA: Diagnosis not present

## 2023-02-14 DIAGNOSIS — I639 Cerebral infarction, unspecified: Secondary | ICD-10-CM | POA: Diagnosis not present

## 2023-02-14 DIAGNOSIS — M6281 Muscle weakness (generalized): Secondary | ICD-10-CM | POA: Diagnosis not present

## 2023-02-15 ENCOUNTER — Encounter: Payer: Medicare Other | Admitting: Internal Medicine

## 2023-02-15 DIAGNOSIS — M6281 Muscle weakness (generalized): Secondary | ICD-10-CM | POA: Diagnosis not present

## 2023-02-15 DIAGNOSIS — I639 Cerebral infarction, unspecified: Secondary | ICD-10-CM | POA: Diagnosis not present

## 2023-02-15 DIAGNOSIS — R278 Other lack of coordination: Secondary | ICD-10-CM | POA: Diagnosis not present

## 2023-02-15 DIAGNOSIS — D649 Anemia, unspecified: Secondary | ICD-10-CM | POA: Diagnosis not present

## 2023-02-15 DIAGNOSIS — R1312 Dysphagia, oropharyngeal phase: Secondary | ICD-10-CM | POA: Diagnosis not present

## 2023-02-15 DIAGNOSIS — R499 Unspecified voice and resonance disorder: Secondary | ICD-10-CM | POA: Diagnosis not present

## 2023-02-16 DIAGNOSIS — I639 Cerebral infarction, unspecified: Secondary | ICD-10-CM | POA: Diagnosis not present

## 2023-02-16 DIAGNOSIS — R499 Unspecified voice and resonance disorder: Secondary | ICD-10-CM | POA: Diagnosis not present

## 2023-02-16 DIAGNOSIS — M6281 Muscle weakness (generalized): Secondary | ICD-10-CM | POA: Diagnosis not present

## 2023-02-16 DIAGNOSIS — R1312 Dysphagia, oropharyngeal phase: Secondary | ICD-10-CM | POA: Diagnosis not present

## 2023-02-16 DIAGNOSIS — R278 Other lack of coordination: Secondary | ICD-10-CM | POA: Diagnosis not present

## 2023-02-17 DIAGNOSIS — I639 Cerebral infarction, unspecified: Secondary | ICD-10-CM | POA: Diagnosis not present

## 2023-02-17 DIAGNOSIS — R1312 Dysphagia, oropharyngeal phase: Secondary | ICD-10-CM | POA: Diagnosis not present

## 2023-02-17 DIAGNOSIS — R499 Unspecified voice and resonance disorder: Secondary | ICD-10-CM | POA: Diagnosis not present

## 2023-02-17 DIAGNOSIS — R278 Other lack of coordination: Secondary | ICD-10-CM | POA: Diagnosis not present

## 2023-02-17 DIAGNOSIS — M6281 Muscle weakness (generalized): Secondary | ICD-10-CM | POA: Diagnosis not present

## 2023-02-21 DIAGNOSIS — E785 Hyperlipidemia, unspecified: Secondary | ICD-10-CM | POA: Diagnosis not present

## 2023-02-21 DIAGNOSIS — D510 Vitamin B12 deficiency anemia due to intrinsic factor deficiency: Secondary | ICD-10-CM | POA: Diagnosis not present

## 2023-02-21 DIAGNOSIS — I10 Essential (primary) hypertension: Secondary | ICD-10-CM | POA: Diagnosis not present

## 2023-02-21 DIAGNOSIS — I639 Cerebral infarction, unspecified: Secondary | ICD-10-CM | POA: Diagnosis not present

## 2023-02-21 DIAGNOSIS — G8194 Hemiplegia, unspecified affecting left nondominant side: Secondary | ICD-10-CM | POA: Diagnosis not present

## 2023-02-22 DIAGNOSIS — R499 Unspecified voice and resonance disorder: Secondary | ICD-10-CM | POA: Diagnosis not present

## 2023-02-22 DIAGNOSIS — I639 Cerebral infarction, unspecified: Secondary | ICD-10-CM | POA: Diagnosis not present

## 2023-02-22 DIAGNOSIS — R278 Other lack of coordination: Secondary | ICD-10-CM | POA: Diagnosis not present

## 2023-02-22 DIAGNOSIS — M6281 Muscle weakness (generalized): Secondary | ICD-10-CM | POA: Diagnosis not present

## 2023-02-22 DIAGNOSIS — R1312 Dysphagia, oropharyngeal phase: Secondary | ICD-10-CM | POA: Diagnosis not present

## 2023-02-24 ENCOUNTER — Encounter: Payer: Medicare Other | Attending: Physical Medicine & Rehabilitation | Admitting: Physical Medicine & Rehabilitation

## 2023-02-24 ENCOUNTER — Encounter: Payer: Self-pay | Admitting: Physical Medicine & Rehabilitation

## 2023-02-24 VITALS — BP 137/72 | HR 102 | Ht 66.0 in

## 2023-02-24 DIAGNOSIS — M6281 Muscle weakness (generalized): Secondary | ICD-10-CM | POA: Diagnosis not present

## 2023-02-24 DIAGNOSIS — G8114 Spastic hemiplegia affecting left nondominant side: Secondary | ICD-10-CM | POA: Diagnosis not present

## 2023-02-24 DIAGNOSIS — R499 Unspecified voice and resonance disorder: Secondary | ICD-10-CM | POA: Diagnosis not present

## 2023-02-24 DIAGNOSIS — R1312 Dysphagia, oropharyngeal phase: Secondary | ICD-10-CM | POA: Diagnosis not present

## 2023-02-24 DIAGNOSIS — R278 Other lack of coordination: Secondary | ICD-10-CM | POA: Diagnosis not present

## 2023-02-24 DIAGNOSIS — I639 Cerebral infarction, unspecified: Secondary | ICD-10-CM | POA: Diagnosis not present

## 2023-02-24 MED ORDER — ONABOTULINUMTOXINA 100 UNITS IJ SOLR
300.0000 [IU] | Freq: Once | INTRAMUSCULAR | Status: AC
Start: 2023-02-24 — End: 2023-02-24
  Administered 2023-02-24: 300 [IU] via INTRAMUSCULAR

## 2023-02-24 NOTE — Progress Notes (Signed)
Botox Injection for spasticity using needle EMG guidance  Dilution: 50 Units/ml Indication: Severe spasticity which interferes with ADL,mobility and/or  hygiene and is unresponsive to medication management and other conservative care Informed consent was obtained after describing risks and benefits of the procedure with the patient. This includes bleeding, bruising, infection, excessive weakness, or medication side effects. A REMS form is on file and signed. Needle: 27g 1" needle electrode Number of units per muscle Left upper extremity trunk Pectoralis 75 units Left upper extremity elbow flexors Biceps 50 units Brachialis 50 units  Left wrist flexors FCR 25 units Left finger flexors FDS 50 units FDP 50 units All injections were done after obtaining appropriate EMG activity and after negative drawback for blood. The patient tolerated the procedure well. Post procedure instructions were given. A followup appointment was made.

## 2023-02-27 ENCOUNTER — Ambulatory Visit: Payer: Medicare Other

## 2023-02-27 DIAGNOSIS — I63521 Cerebral infarction due to unspecified occlusion or stenosis of right anterior cerebral artery: Secondary | ICD-10-CM

## 2023-02-27 DIAGNOSIS — R1312 Dysphagia, oropharyngeal phase: Secondary | ICD-10-CM | POA: Diagnosis not present

## 2023-02-27 DIAGNOSIS — R499 Unspecified voice and resonance disorder: Secondary | ICD-10-CM | POA: Diagnosis not present

## 2023-02-27 DIAGNOSIS — R278 Other lack of coordination: Secondary | ICD-10-CM | POA: Diagnosis not present

## 2023-02-27 DIAGNOSIS — I639 Cerebral infarction, unspecified: Secondary | ICD-10-CM | POA: Diagnosis not present

## 2023-02-27 DIAGNOSIS — M6281 Muscle weakness (generalized): Secondary | ICD-10-CM | POA: Diagnosis not present

## 2023-02-28 DIAGNOSIS — R499 Unspecified voice and resonance disorder: Secondary | ICD-10-CM | POA: Diagnosis not present

## 2023-02-28 DIAGNOSIS — M6281 Muscle weakness (generalized): Secondary | ICD-10-CM | POA: Diagnosis not present

## 2023-02-28 DIAGNOSIS — I639 Cerebral infarction, unspecified: Secondary | ICD-10-CM | POA: Diagnosis not present

## 2023-02-28 DIAGNOSIS — R278 Other lack of coordination: Secondary | ICD-10-CM | POA: Diagnosis not present

## 2023-02-28 DIAGNOSIS — R1312 Dysphagia, oropharyngeal phase: Secondary | ICD-10-CM | POA: Diagnosis not present

## 2023-02-28 LAB — CUP PACEART REMOTE DEVICE CHECK
Date Time Interrogation Session: 20241020231951
Implantable Pulse Generator Implant Date: 20240708

## 2023-03-01 DIAGNOSIS — R1319 Other dysphagia: Secondary | ICD-10-CM | POA: Diagnosis not present

## 2023-03-01 DIAGNOSIS — I639 Cerebral infarction, unspecified: Secondary | ICD-10-CM | POA: Diagnosis not present

## 2023-03-01 DIAGNOSIS — Z7689 Persons encountering health services in other specified circumstances: Secondary | ICD-10-CM | POA: Diagnosis not present

## 2023-03-01 DIAGNOSIS — M6281 Muscle weakness (generalized): Secondary | ICD-10-CM | POA: Diagnosis not present

## 2023-03-03 DIAGNOSIS — R499 Unspecified voice and resonance disorder: Secondary | ICD-10-CM | POA: Diagnosis not present

## 2023-03-03 DIAGNOSIS — R1312 Dysphagia, oropharyngeal phase: Secondary | ICD-10-CM | POA: Diagnosis not present

## 2023-03-03 DIAGNOSIS — Z7689 Persons encountering health services in other specified circumstances: Secondary | ICD-10-CM | POA: Diagnosis not present

## 2023-03-03 DIAGNOSIS — R278 Other lack of coordination: Secondary | ICD-10-CM | POA: Diagnosis not present

## 2023-03-03 DIAGNOSIS — R197 Diarrhea, unspecified: Secondary | ICD-10-CM | POA: Diagnosis not present

## 2023-03-03 DIAGNOSIS — I639 Cerebral infarction, unspecified: Secondary | ICD-10-CM | POA: Diagnosis not present

## 2023-03-03 DIAGNOSIS — M6281 Muscle weakness (generalized): Secondary | ICD-10-CM | POA: Diagnosis not present

## 2023-03-06 DIAGNOSIS — I639 Cerebral infarction, unspecified: Secondary | ICD-10-CM | POA: Diagnosis not present

## 2023-03-06 DIAGNOSIS — M6281 Muscle weakness (generalized): Secondary | ICD-10-CM | POA: Diagnosis not present

## 2023-03-06 DIAGNOSIS — R499 Unspecified voice and resonance disorder: Secondary | ICD-10-CM | POA: Diagnosis not present

## 2023-03-06 DIAGNOSIS — R278 Other lack of coordination: Secondary | ICD-10-CM | POA: Diagnosis not present

## 2023-03-06 DIAGNOSIS — R1312 Dysphagia, oropharyngeal phase: Secondary | ICD-10-CM | POA: Diagnosis not present

## 2023-03-07 DIAGNOSIS — M6281 Muscle weakness (generalized): Secondary | ICD-10-CM | POA: Diagnosis not present

## 2023-03-07 DIAGNOSIS — R278 Other lack of coordination: Secondary | ICD-10-CM | POA: Diagnosis not present

## 2023-03-07 DIAGNOSIS — R1312 Dysphagia, oropharyngeal phase: Secondary | ICD-10-CM | POA: Diagnosis not present

## 2023-03-07 DIAGNOSIS — I639 Cerebral infarction, unspecified: Secondary | ICD-10-CM | POA: Diagnosis not present

## 2023-03-07 DIAGNOSIS — R499 Unspecified voice and resonance disorder: Secondary | ICD-10-CM | POA: Diagnosis not present

## 2023-03-08 DIAGNOSIS — R278 Other lack of coordination: Secondary | ICD-10-CM | POA: Diagnosis not present

## 2023-03-08 DIAGNOSIS — R499 Unspecified voice and resonance disorder: Secondary | ICD-10-CM | POA: Diagnosis not present

## 2023-03-08 DIAGNOSIS — R1312 Dysphagia, oropharyngeal phase: Secondary | ICD-10-CM | POA: Diagnosis not present

## 2023-03-08 DIAGNOSIS — M6281 Muscle weakness (generalized): Secondary | ICD-10-CM | POA: Diagnosis not present

## 2023-03-08 DIAGNOSIS — I639 Cerebral infarction, unspecified: Secondary | ICD-10-CM | POA: Diagnosis not present

## 2023-03-09 DIAGNOSIS — R499 Unspecified voice and resonance disorder: Secondary | ICD-10-CM | POA: Diagnosis not present

## 2023-03-09 DIAGNOSIS — I639 Cerebral infarction, unspecified: Secondary | ICD-10-CM | POA: Diagnosis not present

## 2023-03-09 DIAGNOSIS — R278 Other lack of coordination: Secondary | ICD-10-CM | POA: Diagnosis not present

## 2023-03-09 DIAGNOSIS — R1312 Dysphagia, oropharyngeal phase: Secondary | ICD-10-CM | POA: Diagnosis not present

## 2023-03-09 DIAGNOSIS — M6281 Muscle weakness (generalized): Secondary | ICD-10-CM | POA: Diagnosis not present

## 2023-03-10 DIAGNOSIS — I639 Cerebral infarction, unspecified: Secondary | ICD-10-CM | POA: Diagnosis not present

## 2023-03-10 DIAGNOSIS — M6281 Muscle weakness (generalized): Secondary | ICD-10-CM | POA: Diagnosis not present

## 2023-03-10 DIAGNOSIS — R2689 Other abnormalities of gait and mobility: Secondary | ICD-10-CM | POA: Diagnosis not present

## 2023-03-10 DIAGNOSIS — R499 Unspecified voice and resonance disorder: Secondary | ICD-10-CM | POA: Diagnosis not present

## 2023-03-10 DIAGNOSIS — G20A1 Parkinson's disease without dyskinesia, without mention of fluctuations: Secondary | ICD-10-CM | POA: Diagnosis not present

## 2023-03-10 DIAGNOSIS — R278 Other lack of coordination: Secondary | ICD-10-CM | POA: Diagnosis not present

## 2023-03-10 DIAGNOSIS — Z7689 Persons encountering health services in other specified circumstances: Secondary | ICD-10-CM | POA: Diagnosis not present

## 2023-03-10 DIAGNOSIS — R1312 Dysphagia, oropharyngeal phase: Secondary | ICD-10-CM | POA: Diagnosis not present

## 2023-03-13 DIAGNOSIS — I639 Cerebral infarction, unspecified: Secondary | ICD-10-CM | POA: Diagnosis not present

## 2023-03-13 DIAGNOSIS — R1312 Dysphagia, oropharyngeal phase: Secondary | ICD-10-CM | POA: Diagnosis not present

## 2023-03-13 DIAGNOSIS — R499 Unspecified voice and resonance disorder: Secondary | ICD-10-CM | POA: Diagnosis not present

## 2023-03-13 DIAGNOSIS — M6281 Muscle weakness (generalized): Secondary | ICD-10-CM | POA: Diagnosis not present

## 2023-03-13 DIAGNOSIS — R278 Other lack of coordination: Secondary | ICD-10-CM | POA: Diagnosis not present

## 2023-03-14 DIAGNOSIS — R1312 Dysphagia, oropharyngeal phase: Secondary | ICD-10-CM | POA: Diagnosis not present

## 2023-03-14 DIAGNOSIS — R278 Other lack of coordination: Secondary | ICD-10-CM | POA: Diagnosis not present

## 2023-03-14 DIAGNOSIS — R499 Unspecified voice and resonance disorder: Secondary | ICD-10-CM | POA: Diagnosis not present

## 2023-03-14 DIAGNOSIS — I639 Cerebral infarction, unspecified: Secondary | ICD-10-CM | POA: Diagnosis not present

## 2023-03-14 DIAGNOSIS — M6281 Muscle weakness (generalized): Secondary | ICD-10-CM | POA: Diagnosis not present

## 2023-03-15 DIAGNOSIS — R1312 Dysphagia, oropharyngeal phase: Secondary | ICD-10-CM | POA: Diagnosis not present

## 2023-03-15 DIAGNOSIS — R499 Unspecified voice and resonance disorder: Secondary | ICD-10-CM | POA: Diagnosis not present

## 2023-03-15 DIAGNOSIS — I639 Cerebral infarction, unspecified: Secondary | ICD-10-CM | POA: Diagnosis not present

## 2023-03-15 DIAGNOSIS — R278 Other lack of coordination: Secondary | ICD-10-CM | POA: Diagnosis not present

## 2023-03-15 DIAGNOSIS — M6281 Muscle weakness (generalized): Secondary | ICD-10-CM | POA: Diagnosis not present

## 2023-03-16 DIAGNOSIS — I639 Cerebral infarction, unspecified: Secondary | ICD-10-CM | POA: Diagnosis not present

## 2023-03-16 DIAGNOSIS — M6281 Muscle weakness (generalized): Secondary | ICD-10-CM | POA: Diagnosis not present

## 2023-03-16 DIAGNOSIS — R499 Unspecified voice and resonance disorder: Secondary | ICD-10-CM | POA: Diagnosis not present

## 2023-03-16 DIAGNOSIS — R278 Other lack of coordination: Secondary | ICD-10-CM | POA: Diagnosis not present

## 2023-03-16 DIAGNOSIS — R1312 Dysphagia, oropharyngeal phase: Secondary | ICD-10-CM | POA: Diagnosis not present

## 2023-03-16 NOTE — Progress Notes (Signed)
Carelink Summary Report / Loop Recorder 

## 2023-03-17 DIAGNOSIS — M6281 Muscle weakness (generalized): Secondary | ICD-10-CM | POA: Diagnosis not present

## 2023-03-17 DIAGNOSIS — R1312 Dysphagia, oropharyngeal phase: Secondary | ICD-10-CM | POA: Diagnosis not present

## 2023-03-17 DIAGNOSIS — R278 Other lack of coordination: Secondary | ICD-10-CM | POA: Diagnosis not present

## 2023-03-17 DIAGNOSIS — R499 Unspecified voice and resonance disorder: Secondary | ICD-10-CM | POA: Diagnosis not present

## 2023-03-17 DIAGNOSIS — I639 Cerebral infarction, unspecified: Secondary | ICD-10-CM | POA: Diagnosis not present

## 2023-03-20 DIAGNOSIS — R499 Unspecified voice and resonance disorder: Secondary | ICD-10-CM | POA: Diagnosis not present

## 2023-03-20 DIAGNOSIS — R278 Other lack of coordination: Secondary | ICD-10-CM | POA: Diagnosis not present

## 2023-03-20 DIAGNOSIS — M6281 Muscle weakness (generalized): Secondary | ICD-10-CM | POA: Diagnosis not present

## 2023-03-20 DIAGNOSIS — I639 Cerebral infarction, unspecified: Secondary | ICD-10-CM | POA: Diagnosis not present

## 2023-03-20 DIAGNOSIS — R1312 Dysphagia, oropharyngeal phase: Secondary | ICD-10-CM | POA: Diagnosis not present

## 2023-03-22 DIAGNOSIS — I639 Cerebral infarction, unspecified: Secondary | ICD-10-CM | POA: Diagnosis not present

## 2023-03-22 DIAGNOSIS — M6281 Muscle weakness (generalized): Secondary | ICD-10-CM | POA: Diagnosis not present

## 2023-03-22 DIAGNOSIS — R1312 Dysphagia, oropharyngeal phase: Secondary | ICD-10-CM | POA: Diagnosis not present

## 2023-03-22 DIAGNOSIS — R499 Unspecified voice and resonance disorder: Secondary | ICD-10-CM | POA: Diagnosis not present

## 2023-03-22 DIAGNOSIS — R278 Other lack of coordination: Secondary | ICD-10-CM | POA: Diagnosis not present

## 2023-03-23 ENCOUNTER — Ambulatory Visit: Payer: Medicare Other | Admitting: Neurology

## 2023-03-24 DIAGNOSIS — R1312 Dysphagia, oropharyngeal phase: Secondary | ICD-10-CM | POA: Diagnosis not present

## 2023-03-24 DIAGNOSIS — M6281 Muscle weakness (generalized): Secondary | ICD-10-CM | POA: Diagnosis not present

## 2023-03-24 DIAGNOSIS — R278 Other lack of coordination: Secondary | ICD-10-CM | POA: Diagnosis not present

## 2023-03-24 DIAGNOSIS — R499 Unspecified voice and resonance disorder: Secondary | ICD-10-CM | POA: Diagnosis not present

## 2023-03-24 DIAGNOSIS — I639 Cerebral infarction, unspecified: Secondary | ICD-10-CM | POA: Diagnosis not present

## 2023-03-27 DIAGNOSIS — I639 Cerebral infarction, unspecified: Secondary | ICD-10-CM | POA: Diagnosis not present

## 2023-03-27 DIAGNOSIS — R278 Other lack of coordination: Secondary | ICD-10-CM | POA: Diagnosis not present

## 2023-03-27 DIAGNOSIS — R1312 Dysphagia, oropharyngeal phase: Secondary | ICD-10-CM | POA: Diagnosis not present

## 2023-03-27 DIAGNOSIS — R499 Unspecified voice and resonance disorder: Secondary | ICD-10-CM | POA: Diagnosis not present

## 2023-03-27 DIAGNOSIS — M6281 Muscle weakness (generalized): Secondary | ICD-10-CM | POA: Diagnosis not present

## 2023-03-29 DIAGNOSIS — M6281 Muscle weakness (generalized): Secondary | ICD-10-CM | POA: Diagnosis not present

## 2023-03-29 DIAGNOSIS — R278 Other lack of coordination: Secondary | ICD-10-CM | POA: Diagnosis not present

## 2023-03-29 DIAGNOSIS — R1312 Dysphagia, oropharyngeal phase: Secondary | ICD-10-CM | POA: Diagnosis not present

## 2023-03-29 DIAGNOSIS — R499 Unspecified voice and resonance disorder: Secondary | ICD-10-CM | POA: Diagnosis not present

## 2023-03-29 DIAGNOSIS — I639 Cerebral infarction, unspecified: Secondary | ICD-10-CM | POA: Diagnosis not present

## 2023-04-03 ENCOUNTER — Ambulatory Visit (INDEPENDENT_AMBULATORY_CARE_PROVIDER_SITE_OTHER): Payer: Medicare Other

## 2023-04-03 DIAGNOSIS — I63521 Cerebral infarction due to unspecified occlusion or stenosis of right anterior cerebral artery: Secondary | ICD-10-CM | POA: Diagnosis not present

## 2023-04-03 DIAGNOSIS — R499 Unspecified voice and resonance disorder: Secondary | ICD-10-CM | POA: Diagnosis not present

## 2023-04-03 DIAGNOSIS — R278 Other lack of coordination: Secondary | ICD-10-CM | POA: Diagnosis not present

## 2023-04-03 DIAGNOSIS — M6281 Muscle weakness (generalized): Secondary | ICD-10-CM | POA: Diagnosis not present

## 2023-04-03 DIAGNOSIS — I639 Cerebral infarction, unspecified: Secondary | ICD-10-CM | POA: Diagnosis not present

## 2023-04-03 DIAGNOSIS — R1312 Dysphagia, oropharyngeal phase: Secondary | ICD-10-CM | POA: Diagnosis not present

## 2023-04-03 LAB — CUP PACEART REMOTE DEVICE CHECK
Date Time Interrogation Session: 20241122230611
Implantable Pulse Generator Implant Date: 20240708

## 2023-04-04 ENCOUNTER — Encounter: Payer: Self-pay | Admitting: Physical Medicine & Rehabilitation

## 2023-04-04 ENCOUNTER — Encounter: Payer: Medicare Other | Attending: Physical Medicine & Rehabilitation | Admitting: Physical Medicine & Rehabilitation

## 2023-04-04 VITALS — BP 138/55 | HR 89 | Ht 66.0 in

## 2023-04-04 DIAGNOSIS — G8114 Spastic hemiplegia affecting left nondominant side: Secondary | ICD-10-CM | POA: Diagnosis not present

## 2023-04-04 DIAGNOSIS — M720 Palmar fascial fibromatosis [Dupuytren]: Secondary | ICD-10-CM | POA: Diagnosis not present

## 2023-04-04 NOTE — Progress Notes (Signed)
Subjective:    Patient ID: Samuel Pearson, male    DOB: [birth year], 77 y.o.   MRN: [id number] 77 y.o. male who persented to [facility] ED as code stroke on 11/08/2022. He awoke that morning and attempted to walk to the bathroom but fell because of left-sided weakness. His wife noted an episode of bowel incontinence. Allergy consulted and was outside of the window for tenecteplase. He was admitted to the hospitalist service. Was reassessed later in the afternoon and noted to have twitching/jerking movements in bilateral lower extremities left worse than right. CT of the head without acute findings. 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 No hemorrhage. MRI also performed of the complete spine but limited due to motion artifact. Pete CT head was performed as well as EEG. CTA of the head with intracranial atherosclerotic disease with multifocal stenoses, most notably of a moderate degree of the left paraclinoid ICA. 2D echo with EF of 60 to 65% and left atrial dilation. Hemoglobin A1c 5.7%. He was started on aspirin and Plavix for 3 weeks followed by Plavix alone. Tolerating diet. BP controlled. Neurosurgery, Dr. Conchita Paris consulted about cervical spinal stenosis and felt finding to be chronic and recommended outpatient follow-up once he is off Plavix. Did not feel like there is utility for steroid either.  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  He has completed inpatient rehabilitation at [facility] and is now in a skilled nursing facility at [facility].  He is getting regular PT and OT.  Because of spasticity in the left upper extremity and left pectoralis region, the patient underwent botulinum toxin injection approximately 6 weeks ago.  The patient states that he does not feel he can move the arm better.  We discussed that botulinum toxin can only relax  muscles and does not make them stronger. We discussed his dose as well as muscle group selection.  His wife was in attendance. The patient gives a history of left hand trigger finger that improved after an injection some years ago.  He thinks it was his index finger.   Left upper extremity trunk Pectoralis 75 units Left upper extremity elbow flexors Biceps 50 units Brachialis 50 units  Left wrist flexors FCR 25 units Left finger flexors FDS 50 units FDP 50 units Pain Inventory Average Pain 3 Pain Right Now 2 My pain is intermittent and dull  In the last 24 hours, has pain interfered with the following? General activity  little Relation with others 0 Enjoyment of life 1 What TIME of day is your pain at its worst? morning  Sleep (in general) Fair  Pain is worse with: inactivity Pain improves with: heat/ice and therapy/exercise Relief from Meds:  no pain taken  Family History  Problem Relation Age of Onset   Arthritis Mother    Hypertension Mother    Arthritis Father    Hypertension Father    Social History   Socioeconomic History   Marital status: Married    Spouse name: Not on file   Number of children: Not on file   Years of education: Not on file   Highest education level: Not on file  Occupational History   Not on file  Tobacco Use   Smoking status: Never   Smokeless tobacco: Never  Vaping Use   Vaping status: Never Used  Substance and Sexual Activity   Alcohol  use: Yes    Alcohol/week: 14.0 - 21.0 standard drinks of alcohol    Types: 14 - 21 Cans of beer per week   Drug use: No   Sexual activity: Not on file  Other Topics Concern   Not on file  Social History Narrative   Retired IT trainer from [place]   Married   Some Caffeine   Helps take care of grandkids   recently moved residence    Social Determinants of Corporate investment banker Strain: Not on file  Food Insecurity: No Food Insecurity (11/08/2022)   Hunger  Vital Sign    Worried About Running Out of Food in the Last Year: Never true    Ran Out of Food in the Last Year: Never true  Transportation Needs: No Transportation Needs (11/08/2022)   PRAPARE - Administrator, Civil Service (Medical): No    Lack of Transportation (Non-Medical): No  Physical Activity: Not on file  Stress: Not on file  Social Connections: Not on file   Past Surgical History:  Procedure Laterality Date   CATARACT EXTRACTION  2009   [facility]   CHOLECYSTECTOMY N/A 02/25/2020   Procedure: LAPAROSCOPIC CHOLECYSTECTOMY;  Surgeon: Harriette Bouillon, MD;  Location: [facility] OR;  Service: General;  Laterality: N/A;   LOOP RECORDER INSERTION N/A 11/14/2022   Procedure: LOOP RECORDER INSERTION;  Surgeon: Maurice Small, MD;  Location: [facility] INVASIVE CV LAB;  Service: Cardiovascular;  Laterality: N/A;   Past Surgical History:  Procedure Laterality Date   CATARACT EXTRACTION  2009   [facility]   CHOLECYSTECTOMY N/A 02/25/2020   Procedure: LAPAROSCOPIC CHOLECYSTECTOMY;  Surgeon: Harriette Bouillon, MD;  Location: [facility] OR;  Service: General;  Laterality: N/A;   LOOP RECORDER INSERTION N/A 11/14/2022   Procedure: LOOP RECORDER INSERTION;  Surgeon: Maurice Small, MD;  Location: [facility] INVASIVE CV LAB;  Service: Cardiovascular;  Laterality: N/A;   Past Medical History:  Diagnosis Date   Allergic rhinitis    Anxiety    Detached retina    Heart murmur    has had one when a child and checked out ok  never? 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  elevated MCV  No other sx    Ht 5\' 6"  (1.676 m)   BMI 24.13 kg/m   Opioid Risk Score:   Fall Risk Score:  `1  Depression screen PHQ 2/9     04/04/2023    1:59 PM 02/24/2023    2:08 PM 01/27/2023   11:49 AM 01/18/2022   10:04 AM 11/16/2021   10:27 AM 11/18/2020    3:35 PM  11/01/2019    9:41 AM  Depression screen PHQ 2/9  Decreased Interest 1 0 1 0 0  0  Down, Depressed, Hopeless 1 0 1 0 0  0  PHQ - 2 Score 2 0 2 0 0  0  Altered sleeping   0 0 0 0 0  Tired, decreased energy   0 1 0 1 0  Change in appetite   3 0 0  0  Feeling bad or failure about yourself    3 0 0 0 0  Trouble concentrating   1 0 0 0 0  Moving slowly or fidgety/restless   0 0 0 0  0  Suicidal thoughts   1 0 0 0 0  PHQ-9 Score   10 1 0  0  Difficult doing work/chores   Somewhat difficult Not difficult at all Not difficult at all  Not difficult at all    Review of Systems  Musculoskeletal:  Positive for gait problem.       Pain in left fingers, left wrist, left shoulder   All other systems reviewed and are negative.      Objective:   Physical Exam General No acute distress Mood and affect mildly anxious impaired sensation left upper extremityPrior exam  MAS 3 at the left shoulder MAS 3 at the left elbow flexors MAS 2 at the finger flexors wrist flexor left upper extremity thumb flexor is normal. The patient has pain with passive extension of the left fourth PIP. Left lower extremity tone MAS 2 at the knee flexors MSK Pain with external rotation of the left shoulder pain with abduction of the left shoulder Dupuytren's contracture left fourth MCP area Palmar aspect, pain with extension no evidence of palmar swelling    Assessment & Plan:  #1.  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  We discussed hypertonicity and treatment of spasticity with botulinum toxin what the expected effects are.  We discussed that muscle strengthening is not one of the effects of the botulinum toxin.  We discussed that muscle group selection can be optimized and would increase dose to the elbow flexors when we repeat the injection in 6 weeks Left upper extremity trunk  Pectoralis 100 units Left upper extremity elbow flexors Biceps 50 units Brachialis 75 units Brachioradialis 50 Left  wrist flexors FCR 25 units Left finger flexors FDS 50 units FDP 50 units  2.  Dupuytren's contracture left fourth digit.  At this point it is not accessible due to the tone in the left upper extremity.  Patient will have continued OT PT and a repeat Botox injection in 6 weeks.  After that procedure the left fourth A1 pulley region on the palmar aspect should be more accessible.  Discussed complex musculoskeletal and neurologic issues contributing to symptomatology. Discussed with wife.  Additional documentation written for the skilled nursing facility.  Greater than 30 minutes were spent

## 2023-04-10 DIAGNOSIS — M6281 Muscle weakness (generalized): Secondary | ICD-10-CM | POA: Diagnosis not present

## 2023-04-10 DIAGNOSIS — R278 Other lack of coordination: Secondary | ICD-10-CM | POA: Diagnosis not present

## 2023-04-10 DIAGNOSIS — I639 Cerebral infarction, unspecified: Secondary | ICD-10-CM | POA: Diagnosis not present

## 2023-04-25 ENCOUNTER — Encounter: Payer: Self-pay | Admitting: Physical Medicine & Rehabilitation

## 2023-04-26 DIAGNOSIS — Z7689 Persons encountering health services in other specified circumstances: Secondary | ICD-10-CM | POA: Diagnosis not present

## 2023-04-26 DIAGNOSIS — R41841 Cognitive communication deficit: Secondary | ICD-10-CM | POA: Diagnosis not present

## 2023-04-26 DIAGNOSIS — R2689 Other abnormalities of gait and mobility: Secondary | ICD-10-CM | POA: Diagnosis not present

## 2023-04-28 ENCOUNTER — Encounter: Payer: Self-pay | Admitting: Neurology

## 2023-05-01 NOTE — Progress Notes (Signed)
Carelink Summary Report / Loop Recorder 

## 2023-05-05 ENCOUNTER — Encounter: Payer: Self-pay | Admitting: Neurology

## 2023-05-05 ENCOUNTER — Ambulatory Visit: Payer: Medicare Other | Admitting: Neurology

## 2023-05-05 VITALS — BP 130/68 | HR 94 | Wt 132.0 lb

## 2023-05-05 DIAGNOSIS — Z8673 Personal history of transient ischemic attack (TIA), and cerebral infarction without residual deficits: Secondary | ICD-10-CM

## 2023-05-05 DIAGNOSIS — I69398 Other sequelae of cerebral infarction: Secondary | ICD-10-CM

## 2023-05-05 DIAGNOSIS — R569 Unspecified convulsions: Secondary | ICD-10-CM

## 2023-05-05 NOTE — Progress Notes (Unsigned)
NEUROLOGY CONSULTATION NOTE  Samuel POLIT MRN: [id number] DOB: 02/15/1946  Referring provider: Dr. Berniece Pearson Primary care provider: Dr. Berniece Pearson  Reason for consult:  seizure  Dear Dr Samuel Pearson:  Thank you for your kind referral of Samuel Pearson for consultation of the above symptoms. Although his history is well known to you, please allow me to reiterate it for the purpose of our medical record. The patient was accompanied to the clinic by his wife who also provides collateral information. Records and images were personally reviewed where available.   HISTORY OF PRESENT ILLNESS: ***.  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 Patient presents from home reporting left leg weakness. He woke up this morning due to his back pain. When he stood up he noted that his left leg was weak and fell on the floor walking to the bathroom. He reports bowel incontinence. He states that he pressure washed all day yesterday. He is not currently having back pain.    Reassessed later in the afternoon and patient is noted to have twitching/jerking movements in his bilateral lower extremities, left worse than right. Wife and daughter are at the bedside and they state that he did not do any power washing yesterday. He had a  normal day and was in his usual state of health. His wife states that he called her this morning from the bathroom for help. He was on the ground when she got to the bathroom. They spent about 2.5 hours trying to get him up and then she called EMS for assistance. She noted left sided weakness as well.   CT-head- 1. No acute finding. 2. 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   MRI examination of the brain 1. 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 No hemorrhage.   MRI Cervical Spine Assessment of the cervical spine is markedly limited due to the degree of motion artifact. Within this limitation, there is severe spinal canal stenosis at C3-C4 and C4-C5 with likely cord signal abnormality at the C3-C4 level, which is more suggestive of myelomalacia rather than  demyelinating disease.   MRI Lumbar Spine Assessment of the lumbar spine is markedly limited due to the degree of motion artifact. Within this limitation, there is moderate to severe spinal canal narrowing at L3-L4 and L4-L5 and severe right-sided neural foraminal narrowing at L4-L5.   MRI Thoracic Spine  Assessment of the thoracic spine is limited due to a combination of motion artifact on the sagittal sequences and lack of axial T2 weighted sequences within this limitation, no definite evidence of high grade spinal canal stenosis  or cord signal abnormality. EEG excess diffuse beta Neurosurgery, Dr. Conchita Paris consulted about cervical spinal stenosis and felt finding to be chronic and recommended outpatient follow-up once he is off Plavix. Did not feel like there is utility for steroid either.  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 He has some wiggling movements of his RUE which are distractible and volitionally  suppressible and do not appear epileptic.  Acute Ischemic Infarct:  Right ACA territory infarcts  Etiology:  likely embolic  CTA neck:  Atherosclerotic plaque about both carotid bifurcations and within the proximal right ICA. Less than 50% stenosis of the proximal right ICA. Advanced cervical spondylosis. Probable vertebral body ankylosis at C5-C6. CTA head: Intracranial atherosclerotic disease with multifocal stenoses, most notably as follows. Up to moderate stenosis of the paraclinoid left ICA. Sites of severe stenosis within a mid M2 left MCA vessel. Moderate/severe stenosis within the right PCA P2 segment. Moderate stenosis within the left PCA P2 segment. Severe stenoses within a left PCA branch at the P4 segment level. 2D Echo EF 60-65%, left atrial dilation LDL 130 HgbA1c 5.7 VTE prophylaxis - SCDs aspirin 81 mg daily prior to admission, now on aspirin 81 mg daily and clopidogrel 75 mg daily for 3 weeks and then Plavix alone 10/1: varying mental status, typically at baseline he can talk 2-3 words, family visited him today as they always do noticed that he became very somnolent, was shaking on his left upper side, states that he has been more sleepy and drowsy than usual.   He has underlying CVA and limited ability to communicate usually limited to short 2-3 word sentences and has base left-sided weakness. However, today he has been completely obtunded for much of the day with brief intervals of lucidity and very severe episodes of somnolence/lethargy.  During my evaluation at bedside, he is GCS 3 not responsive to any painful stimuli.  Interestingly, he appears to have rhythmic twitching of his left upper and lower extremity with 5-6 episodes per minute while he is in this state. Loop recorder neg  Seizure symptoms: The patient denies any olfactory/gustatory hallucinations, deja vu, rising epigastric sensation, focal numbness/tingling/weakness, myoclonic jerks.  Epilepsy Risk Factors:  *** had  a normal birth and early development.  There is no history of febrile convulsions, CNS infections such as meningitis/encephalitis, significant traumatic brain injury, neurosurgical procedures, or family history of seizures.  Prior AEDs: Laboratory Data:  EEGs: MRI:   PAST MEDICAL HISTORY: Past Medical History:  Diagnosis Date   Allergic rhinitis    Anxiety    Detached retina    Heart murmur    has had one when a child and checked out ok  never? 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  elevated MCV  No other sx     PAST SURGICAL HISTORY: Past Surgical History:  Procedure Laterality Date   CATARACT EXTRACTION  2009   [facility]   CHOLECYSTECTOMY N/A 02/25/2020   Procedure: LAPAROSCOPIC CHOLECYSTECTOMY;  Surgeon: Harriette Bouillon, MD;  Location: [facility] OR;  Service: General;  Laterality: N/A;   LOOP RECORDER INSERTION N/A 11/14/2022   Procedure: LOOP RECORDER INSERTION;  Surgeon: Maurice Small, MD;  Location: [facility];  Service: Cardiovascular;  Laterality: N/A;  MEDICATIONS: Current Outpatient Medications on File Prior to Visit  Medication Sig Dispense Refill   acetaminophen (TYLENOL) 325 MG tablet Take 1-2 tablets (325-650 mg total) by mouth every 4 (four) hours as needed for mild pain.     amLODipine (NORVASC) 10 MG tablet Take 10 mg by mouth daily.     atorvastatin (LIPITOR) 40 MG tablet Take 1 tablet (40 mg total) by mouth daily. 90 tablet 0   clopidogrel (PLAVIX) 75 MG tablet Take 1 tablet (75 mg total) by mouth daily. 90 tablet 3   folic acid (FOLVITE) 1 MG tablet Take 1 tablet (1 mg total) by mouth daily.     levETIRAcetam (KEPPRA) 500 MG tablet Take 1 tablet (500 mg total) by mouth 2 (two) times daily. 60 tablet 0   lisinopril (ZESTRIL) 10 MG tablet Take 1 tablet (10 mg total) by mouth daily.     melatonin 5 MG  TABS Take 5 mg by mouth.     methocarbamol (ROBAXIN) 500 MG tablet Take 1 tablet (500 mg total) by mouth every 6 (six) hours as needed for muscle spasms. 10 tablet 0   methylphenidate (RITALIN) 10 MG tablet Take 1 tablet (10 mg total) by mouth 2 (two) times daily with breakfast and lunch. 10 tablet 0   metoprolol tartrate (LOPRESSOR) 25 MG tablet Take 25 mg by mouth 2 (two) times daily.     Multiple Vitamin (MULTIVITAMIN WITH MINERALS) TABS tablet Take 1 tablet by mouth daily.     pantoprazole (PROTONIX) 40 MG tablet Take 1 tablet (40 mg total) by mouth daily.     No current facility-administered medications on file prior to visit.    ALLERGIES: Allergies  Allergen Reactions   Cinnamon Other (See Comments)    intolerance   Garlic Nausea Only    Especially fresh garlic   Other Other (See Comments)    All kind of nuts except peanuts - intolerance   Pollen Extract-Tree Extract [Pollen Extract]     FAMILY HISTORY: Family History  Problem Relation Age of Onset   Arthritis Mother    Hypertension Mother    Arthritis Father    Hypertension Father     SOCIAL HISTORY: Social History   Socioeconomic History   Marital status: Married    Spouse name: Not on file   Number of children: Not on file   Years of education: Not on file   Highest education level: Not on file  Occupational History   Not on file  Tobacco Use   Smoking status: Never   Smokeless tobacco: Never  Vaping Use   Vaping status: Never Used  Substance and Sexual Activity   Alcohol use: Not Currently    Alcohol/week: 14.0 - 21.0 standard drinks of alcohol    Types: 14 - 21 Cans of beer per week   Drug use: No   Sexual activity: Not on file  Other Topics Concern   Not on file  Social History Narrative   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   Pt is married    Right handed    Social Drivers of  Corporate investment banker Strain: Not on file  Food Insecurity: No Food Insecurity (11/08/2022)   Hunger Vital Sign    Worried About Running Out of Food in the Last Year: Never true    Ran Out of Food in the Last Year: Never true  Transportation Needs:  No Transportation Needs (11/08/2022)   PRAPARE - Administrator, Civil Service (Medical): No    Lack of Transportation (Non-Medical): No  Physical Activity: Not on file  Stress: Not on file  Social Connections: Not on file  Intimate Partner Violence: Not At Risk (11/08/2022)   Humiliation, Afraid, Rape, and Kick questionnaire    Fear of Current or Ex-Partner: No    Emotionally Abused: No    Physically Abused: No    Sexually Abused: No     PHYSICAL EXAM: Vitals:   05/05/23 0919  BP: 130/68  Pulse: 94  SpO2: 97%   General: No acute distress Head:  Normocephalic/atraumatic Skin/Extremities: No rash, no edema Neurological Exam: Mental status: alert and oriented to person, place, and time, no dysarthria or aphasia, Fund of knowledge is appropriate.  Recent and remote memory are intact.  Attention and concentration are normal.    Able to name objects and repeat phrases. Cranial nerves: CN I: not tested CN II: pupils equal, round and reactive to light, visual fields intact CN III, IV, VI:  full range of motion, no nystagmus, no ptosis CN V: facial sensation intact CN VII: upper and lower face symmetric CN VIII: hearing intact to conversation Bulk & Tone: normal, no fasciculations. Motor: 5/5 throughout with no pronator drift. Sensation: intact to light touch, cold, pin, vibration and joint position sense.  No extinction to double simultaneous stimulation.  Romberg test *** Deep Tendon Reflexes: +2 throughout, no ankle clonus Plantar responses: downgoing bilaterally Cerebellar: no incoordination on finger to nose, heel to shin. No dysdiadochokinesia Gait: narrow-based and steady, able to tandem walk adequately. Tremor:  ***   IMPRESSION: This is a *** year old ***-handed *** with a history of ***.  [place] driving laws were discussed with the patient, and *** knows to stop driving after a seizure, until 6 months seizure-free.    The duration of this appointment visit was *** minutes of face-to-face time with the patient.  Greater than 50% of this time was spent in counseling, explanation of diagnosis, planning of further management, and coordination of care.  Thank you for allowing me to participate in the care of this patient. Please do not hesitate to call for any questions or concerns.   Patrcia Dolly, M.D.  CC: ***

## 2023-05-05 NOTE — Patient Instructions (Signed)
Good to meet you.  We will look into getting mobility wheelchair  2. Continue all your medications  3. Follow-up in 3 months,call for any changes   Seizure Precautions: 1. If medication has been prescribed for you to prevent seizures, take it exactly as directed.  Do not stop taking the medicine without talking to your doctor first, even if you have not had a seizure in a long time.   2. Avoid activities in which a seizure would cause danger to yourself or to others.  Don't operate dangerous machinery, swim alone, or climb in high or dangerous places, such as on ladders, roofs, or girders.  Do not drive unless your doctor says you may.  3. If you have any warning that you may have a seizure, lay down in a safe place where you can't hurt yourself.    4.  No driving for 6 months from last seizure, as per [facility].   Please refer to the following link on the Epilepsy Foundation of America's website for more information: http://www.epilepsyfoundation.org/answerplace/Social/driving/drivingu.cfm   5.  Maintain good sleep hygiene.   6.  Contact your doctor if you have any problems that may be related to the medicine you are taking.  7.  Call 911 and bring the patient back to the ED if:        A.  The seizure lasts longer than 5 minutes.       B.  The patient doesn't awaken shortly after the seizure  C.  The patient has new problems such as difficulty seeing, speaking or moving  D.  The patient was injured during the seizure  E.  The patient has a temperature over 102 F (39C)  F.  The patient vomited and now is having trouble breathing

## 2023-05-08 ENCOUNTER — Ambulatory Visit (INDEPENDENT_AMBULATORY_CARE_PROVIDER_SITE_OTHER): Payer: Medicare Other

## 2023-05-08 DIAGNOSIS — I63521 Cerebral infarction due to unspecified occlusion or stenosis of right anterior cerebral artery: Secondary | ICD-10-CM

## 2023-05-09 LAB — CUP PACEART REMOTE DEVICE CHECK
Date Time Interrogation Session: 20241229231846
Implantable Pulse Generator Implant Date: 20240708

## 2023-05-16 ENCOUNTER — Encounter: Payer: Medicare Other | Attending: Physical Medicine & Rehabilitation | Admitting: Physical Medicine & Rehabilitation

## 2023-05-16 ENCOUNTER — Encounter: Payer: Self-pay | Admitting: Physical Medicine & Rehabilitation

## 2023-05-16 VITALS — BP 144/66 | HR 97

## 2023-05-16 DIAGNOSIS — G8114 Spastic hemiplegia affecting left nondominant side: Secondary | ICD-10-CM | POA: Insufficient documentation

## 2023-05-16 MED ORDER — ONABOTULINUMTOXINA 100 UNITS IJ SOLR
400.0000 [IU] | Freq: Once | INTRAMUSCULAR | Status: AC
  Administered 2023-05-16: 400 [IU] via INTRAMUSCULAR

## 2023-05-16 NOTE — Patient Instructions (Signed)

## 2023-05-16 NOTE — Progress Notes (Signed)
 Botox  Injection for spasticity using needle EMG guidance  Dilution: 50 Units/ml Indication: Severe spasticity which interferes with ADL,mobility and/or  hygiene and is unresponsive to medication management and other conservative care Informed consent was obtained after describing risks and benefits of the procedure with the patient. This includes bleeding, bruising, infection, excessive weakness, or medication side effects. A REMS form is on file and signed. Needle: 27g 1 needle electrode Number of units per muscle Left upper extremity trunk  Pectoralis 100 units  Left upper extremity elbow flexors Biceps 50 units Brachialis 75 units Brachioradialis 50 Left wrist flexors FCR 25 units Left finger flexors FDS 50 units FDP 50 units All injections were done after obtaining appropriate EMG activity and after negative drawback for blood. The patient tolerated the procedure well. Post procedure instructions were given. A followup appointment was made.

## 2023-05-19 DIAGNOSIS — R2689 Other abnormalities of gait and mobility: Secondary | ICD-10-CM | POA: Diagnosis not present

## 2023-05-19 DIAGNOSIS — Z7689 Persons encountering health services in other specified circumstances: Secondary | ICD-10-CM | POA: Diagnosis not present

## 2023-05-19 DIAGNOSIS — G20A1 Parkinson's disease without dyskinesia, without mention of fluctuations: Secondary | ICD-10-CM | POA: Diagnosis not present

## 2023-06-11 LAB — CUP PACEART REMOTE DEVICE CHECK

## 2023-06-12 ENCOUNTER — Ambulatory Visit (INDEPENDENT_AMBULATORY_CARE_PROVIDER_SITE_OTHER): Payer: Medicare Other

## 2023-06-12 DIAGNOSIS — I63521 Cerebral infarction due to unspecified occlusion or stenosis of right anterior cerebral artery: Secondary | ICD-10-CM

## 2023-06-16 DIAGNOSIS — M6281 Muscle weakness (generalized): Secondary | ICD-10-CM | POA: Diagnosis not present

## 2023-06-16 DIAGNOSIS — I639 Cerebral infarction, unspecified: Secondary | ICD-10-CM | POA: Diagnosis not present

## 2023-06-16 DIAGNOSIS — R278 Other lack of coordination: Secondary | ICD-10-CM | POA: Diagnosis not present

## 2023-06-20 LAB — CUP PACEART REMOTE DEVICE CHECK: Date Time Interrogation Session: 20250202232125

## 2023-06-21 ENCOUNTER — Encounter: Payer: Self-pay | Admitting: Cardiovascular Disease

## 2023-06-27 ENCOUNTER — Encounter: Payer: Self-pay | Admitting: Physical Medicine & Rehabilitation

## 2023-06-27 ENCOUNTER — Encounter: Payer: Medicare Other | Attending: Physical Medicine & Rehabilitation | Admitting: Physical Medicine & Rehabilitation

## 2023-06-27 VITALS — BP 125/63 | HR 108

## 2023-06-27 DIAGNOSIS — G8114 Spastic hemiplegia affecting left nondominant side: Secondary | ICD-10-CM | POA: Diagnosis not present

## 2023-06-27 NOTE — Patient Instructions (Addendum)
Please call if your knee is getting more stiff we can try using Botox for Hamstrings spasticity

## 2023-06-27 NOTE — Progress Notes (Unsigned)
Subjective:    Patient ID: Samuel Pearson, male    DOB: 09-14-1945, 78 y.o.   MRN: [id number]  HPI Left upper extremity trunk  Pectoralis 100 units  Left upper extremity elbow flexors Biceps 50 units Brachialis 75 units Brachioradialis 50 Left wrist flexors FCR 25 units Left finger flexors FDS 50 units FDP 50 units Pain Inventory Average Pain 0 Pain Right Now 0 My pain is  .  In the last 24 hours, has pain interfered with the following? General activity 0 Relation with others 0 Enjoyment of life 0 What TIME of day is your pain at its worst? na Sleep (in general) Fair  Pain is worse with:  . Pain improves with:  . Relief from Meds:  .  Family History  Problem Relation Age of Onset   Arthritis Mother    Hypertension Mother    Arthritis Father    Hypertension Father    Social History   Socioeconomic History   Marital status: Married    Spouse name: Not on file   Number of children: Not on file   Years of education: Not on file   Highest education level: Not on file  Occupational History   Not on file  Tobacco Use   Smoking status: Never   Smokeless tobacco: Never  Vaping Use   Vaping status: Never Used  Substance and Sexual Activity   Alcohol use: Not Currently    Alcohol/week: 14.0 - 21.0 standard drinks of alcohol    Types: 14 - 21 Cans of beer per week   Drug use: No   Sexual activity: Not on file  Other Topics Concern   Not on file  Social History Narrative   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   Pt is married    Right handed    Social Drivers of Corporate investment banker Strain: Not on file  Food Insecurity: No Food Insecurity (11/08/2022)   Hunger Vital Sign    Worried About Running Out of Food in the Last Year: Never true    Ran Out of Food in the Last Year: Never true  Transportation Needs: No Transportation Needs  (11/08/2022)   PRAPARE - Administrator, Civil Service (Medical): No    Lack of Transportation (Non-Medical): No  Physical Activity: Not on file  Stress: Not on file  Social Connections: Not on file   Past Surgical History:  Procedure Laterality Date   CATARACT EXTRACTION  2009   [facility]   CHOLECYSTECTOMY N/A 02/25/2020   Procedure: LAPAROSCOPIC CHOLECYSTECTOMY;  Surgeon: Harriette Bouillon, MD;  Location: [facility] OR;  Service: General;  Laterality: N/A;   LOOP RECORDER INSERTION N/A 11/14/2022   Procedure: LOOP RECORDER INSERTION;  Surgeon: Maurice Small, MD;  Location: [facility];  Service: Cardiovascular;  Laterality: N/A;   Past Surgical History:  Procedure Laterality Date   CATARACT EXTRACTION  2009   [facility]   CHOLECYSTECTOMY N/A 02/25/2020   Procedure: LAPAROSCOPIC CHOLECYSTECTOMY;  Surgeon: Harriette Bouillon, MD;  Location: [facility];  Service: General;  Laterality: N/A;   LOOP RECORDER INSERTION N/A 11/14/2022   Procedure: LOOP RECORDER INSERTION;  Surgeon: Maurice Small, MD;  Location: [facility];  Service: Cardiovascular;  Laterality: N/A;   Past Medical History:  Diagnosis Date   Allergic rhinitis    Anxiety    Detached retina  Heart murmur    has had one when a child and checked out ok  never? 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  elevated MCV  No other sx    BP 125/63   Pulse (!) 108   SpO2 94%   Opioid Risk Score:   Fall Risk Score:  `1  Depression screen PHQ 2/9     04/04/2023    1:59 PM 02/24/2023    2:08 PM 01/27/2023   11:49 AM 01/18/2022   10:04 AM 11/16/2021   10:27 AM 11/18/2020    3:35 PM 11/01/2019    9:41 AM  Depression screen PHQ 2/9  Decreased Interest 1 0 1 0 0  0  Down, Depressed, Hopeless 1 0 1 0 0  0  PHQ - 2 Score 2 0 2 0 0  0  Altered sleeping   0 0 0  0 0  Tired, decreased energy   0 1 0 1 0  Change in appetite   3 0 0  0  Feeling bad or failure about yourself    3 0 0 0 0  Trouble concentrating   1 0 0 0 0  Moving slowly or fidgety/restless   0 0 0 0 0  Suicidal thoughts   1 0 0 0 0  PHQ-9 Score   10 1 0  0  Difficult doing work/chores   Somewhat difficult Not difficult at all Not difficult at all  Not difficult at all     Review of Systems     Objective:   Physical Exam        Assessment & Plan:   Discussed self ROM  Discussed using Botox for hamstring spasticity

## 2023-07-06 ENCOUNTER — Telehealth: Payer: Self-pay | Admitting: Neurology

## 2023-07-06 NOTE — Telephone Encounter (Signed)
 Hey, Wheelchair Rep is calling said one of the Power Wheelchair forms were blurry. She re-faxed [place] the form and wanted to check if you rcvd it and if completed today a Rep Santina Evans can come by and pick it up? Fax#[phone]

## 2023-07-07 NOTE — Telephone Encounter (Signed)
 signed

## 2023-07-17 ENCOUNTER — Ambulatory Visit (INDEPENDENT_AMBULATORY_CARE_PROVIDER_SITE_OTHER): Payer: Medicare Other

## 2023-07-17 DIAGNOSIS — I63521 Cerebral infarction due to unspecified occlusion or stenosis of right anterior cerebral artery: Secondary | ICD-10-CM

## 2023-07-17 LAB — CUP PACEART REMOTE DEVICE CHECK
Date Time Interrogation Session: 20250309232041
Implantable Pulse Generator Implant Date: 20240708

## 2023-07-19 NOTE — Progress Notes (Signed)
 [facility] Summary Report / Loop Recorder

## 2023-07-19 NOTE — Addendum Note (Signed)
 Addended by: Geralyn Flash D on: 07/19/2023 03:17 PM   Modules accepted: Orders

## 2023-07-25 ENCOUNTER — Encounter: Payer: Self-pay | Admitting: Cardiovascular Disease

## 2023-07-25 ENCOUNTER — Ambulatory Visit: Payer: Medicare Other | Admitting: Neurology

## 2023-07-31 ENCOUNTER — Ambulatory Visit: Admitting: Neurology

## 2023-07-31 ENCOUNTER — Encounter: Payer: Self-pay | Admitting: Neurology

## 2023-07-31 VITALS — BP 132/63 | HR 99 | Ht 61.0 in | Wt 132.0 lb

## 2023-07-31 DIAGNOSIS — R569 Unspecified convulsions: Secondary | ICD-10-CM | POA: Diagnosis not present

## 2023-07-31 DIAGNOSIS — I69398 Other sequelae of cerebral infarction: Secondary | ICD-10-CM | POA: Diagnosis not present

## 2023-07-31 NOTE — Progress Notes (Signed)
 NEUROLOGY FOLLOW UP OFFICE NOTE  DYER KLUG [id number] 1945/06/08  HISTORY OF PRESENT ILLNESS: I had the pleasure of seeing Gurtaj Ruz in follow-up in the neurology clinic on 07/31/2023.  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 He is again accompanied by his wife who helps supplement the history today.  Records and images were personally reviewed where available.  Since his last visit, they deny any seizures or seizure-like symptoms since 02/07/2023 on Levetiracetam 500mg  BID, no side effects. No left-sided jerking/twitching or episodes of unresponsiveness. He denies any headaches, dizziness, vision changes, no falls. Sleep is okay overall. He is bothered by his reflux and asks for antacid. His wife asks for a medication "for patience." He had Botox last month, he reports pain in the left 4th digit.    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 He presented with left leg weakness and fall on 11/08/22. MRI brain showed acute infarcts in the right ACA territory along the parasagittal right frontal cortex and in the right frontal centrum semiovale. At that time, he had an episode of twitching/jerking movements of both lower extremities, left worse than right witnessed in the ER. EEG showed excess diffuse beta. His wife reports the episode was attributed to alcohol withdrawal. No further jerking movements until 02/07/23. He is at a SNF and was noted to have altered mental status. His wife was visiting and noticed he was unarousable. Per notes, there was shaking on his left side, she does not recall this today. He was brought to the ER with GCS 3 and note of rhythmic twitching of left upper and lower extremities with 5-6 episodes per minute. MRI brain no acute  changes, there was encephalomalacia and gliosis within the medial superior right frontal lobe, generalized volume loss with ventriculomegaly, chronic microvascular disease. He was discharged back to SNF on Levetiracetam 500mg  BID with no further episodes of decreased responsiveness or twitching. No side effects on LEV.   She denies any staring episodes, he denies any olfactory/gustatory hallucinations, deja vu, rising epigastric sensation, focal numbness/tingling, myoclonic jerks. No headaches, dizziness, vision changes, dysarthria/dysphagia, neck/back pain, bowel/bladder dysfunction. Aurea Graff reports his attention was "lousy" after the stroke, it has improved in the last 2 months. 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 He gets tearful several times in the office due to loss of independence, Aurea Graff reports they talk and he gets back on track. No mood changes with LEV. He states "to this day, I'm very impatient." He has residual left-sided weakness and has been getting Botox in the left upper extremity. He has a wheelchair that is 20 inches deep, helping prevent sliding off the wheelchair, which occurs with regular 16-inch deep ones. He was given a special plastic belt which helps.   Notes from stroke admission reviewed. Etiology of stroke likely embolic. CTA head and neck showed intracranial atherosclerosis with multifocal stenosis, moderate stenosis of the paraclinoid left ICA, severe stenosis within the mid0M2 left MCA, moderate/severe stenosis within the right PCA P2 segment, moderate stenosis within left P2 segment, severe stenosis within the left PCA branch at P4 segment. CTA neck no signficiant stenosis. Echo showed EF 60-65% with left atrial dilation. LDL 130, HbA1c 5.7. He was discharged home on DAPT for 3  weeks then Plavix alone. He has had a loop recorder with no abnormalities captured so far. During his admission, he also had an MRI cervical spine showing  severe spinal canal stenosis at C3-4 and C4-5 with likely cord signal abnormality at C3-4 more suggestive of myelomalacia rather than demyelinating disease. MRI lumbar spine showed moderate to severe spinal canal narrowing at L3-4 and L4-5 and severe right-sided neural foraminal narrowing at L4-5. MRI thoracic spine no definite stenosis seen on limited imaging (motion artifact). He was evaluated by Neurosurgery who recommended outpatient follow-up for cervical spinal stenosis.   Epilepsy Risk Factors:  right frontal encephalomalacia. He had a normal birth and early development.  There is no history of febrile convulsions, CNS infections such as meningitis/encephalitis, significant traumatic brain injury, neurosurgical procedures, or family history of seizures.   PAST MEDICAL HISTORY: Past Medical History:  Diagnosis Date   Allergic rhinitis    Anxiety    Detached retina    Heart murmur    has had one when a child and checked out ok  never? 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  elevated MCV  No other sx     MEDICATIONS: Current Outpatient Medications on File Prior to Visit  Medication Sig Dispense Refill   acetaminophen (TYLENOL) 325 MG tablet Take 1-2 tablets (325-650 mg total) by mouth every 4 (four) hours as needed for mild pain.     amLODipine (NORVASC) 10 MG tablet Take 10 mg by mouth daily.     atorvastatin (LIPITOR) 40 MG tablet Take 1 tablet (40 mg total) by mouth daily. 90 tablet 0   clopidogrel (PLAVIX) 75 MG tablet Take 1 tablet (75 mg total) by mouth daily. 90 tablet 3   folic acid (FOLVITE) 1 MG tablet Take 1 tablet (1 mg total) by mouth daily.     levETIRAcetam (KEPPRA) 500 MG tablet Take 1 tablet (500 mg total) by mouth 2 (two) times daily. 60 tablet 0   lisinopril (ZESTRIL) 10 MG tablet Take 1 tablet (10 mg total) by mouth daily.      melatonin 5 MG TABS Take 5 mg by mouth.     methocarbamol (ROBAXIN) 500 MG tablet Take 1 tablet (500 mg total) by mouth every 6 (six) hours as needed for muscle spasms. 10 tablet 0   methylphenidate (RITALIN) 10 MG tablet Take 1 tablet (10 mg total) by mouth 2 (two) times daily with breakfast and lunch. 10 tablet 0   Multiple Vitamin (MULTIVITAMIN WITH MINERALS) TABS tablet Take 1 tablet by mouth daily.     pantoprazole (PROTONIX) 40 MG tablet Take 1 tablet (40 mg total) by mouth daily.     No current facility-administered medications on file prior to visit.    ALLERGIES: Allergies  Allergen Reactions   Cinnamon Other (See Comments)    intolerance   Garlic Nausea Only    Especially fresh garlic   Other Other (See Comments)    All kind of nuts except peanuts - intolerance   Pollen Extract-Tree Extract [Pollen Extract]     FAMILY HISTORY: Family History  Problem Relation Age of Onset   Arthritis Mother    Hypertension Mother    Arthritis Father    Hypertension Father     SOCIAL HISTORY: Social History   Socioeconomic History   Marital  status: Married    Spouse name: Not on file   Number of children: Not on file   Years of education: Not on file   Highest education level: Not on file  Occupational History   Not on file  Tobacco Use   Smoking status: Never   Smokeless tobacco: Never  Vaping Use   Vaping status: Never Used  Substance and Sexual Activity   Alcohol use: Not Currently    Alcohol/week: 14.0 - 21.0 standard drinks of alcohol    Types: 14 - 21 Cans of beer per week   Drug use: No   Sexual activity: Not on file  Other Topics Concern   Not on file  Social History Narrative   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   Pt is married    Right handed    Social Drivers of Corporate investment banker Strain: Not on file  Food Insecurity: No Food  Insecurity (11/08/2022)   Hunger Vital Sign    Worried About Running Out of Food in the Last Year: Never true    Ran Out of Food in the Last Year: Never true  Transportation Needs: No Transportation Needs (11/08/2022)   PRAPARE - Administrator, Civil Service (Medical): No    Lack of Transportation (Non-Medical): No  Physical Activity: Not on file  Stress: Not on file  Social Connections: Not on file  Intimate Partner Violence: Not At Risk (11/08/2022)   Humiliation, Afraid, Rape, and Kick questionnaire    Fear of Current or Ex-Partner: No    Emotionally Abused: No    Physically Abused: No    Sexually Abused: No     PHYSICAL EXAM: Vitals:   07/31/23 1422  BP: 132/63  Pulse: 99  SpO2: 96%   General: No acute distress, sitting in motorized wheelchair Head:  Normocephalic/atraumatic Skin/Extremities: No rash, no edema Neurological Exam: alert and awake. No aphasia or dysarthria. Fund of knowledge is appropriate. Attention and concentration are normal.   Cranial nerves: Pupils equal, round. Extraocular movements intact with no nystagmus. Visual fields full.  No facial asymmetry.  Motor: increased tone on [name] and LE. 5/5 on right UE and LE, spastic contracture of [name] and fingers, 0/5 on [name] and LE. Finger to nose testing intact on right.    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 He had an episode of decreased responsiveness with intermittent rhythmic twitching of the [name] and LE last 02/07/2023, no recurrence since Levetiracetam 500mg  BID was added. Symptoms overall stable, continue Plavix for secondary stroke prevention, loop recorder has not captured any atrial fibrillation. Continue control of vascular risk factors. His wife asks for medication "for patience," we again discussed post-stroke depression, follow-up with PCP  to discuss SSRI. He does not drive. Follow-up in 1 year, call for any changes.   Thank you for allowing me to participate in his care.  Please do not hesitate to call for any questions or concerns.   Patrcia Dolly, M.D.   CC: Dr. Fabian Sharp

## 2023-07-31 NOTE — Patient Instructions (Signed)
 Good to see you.  Continue all your medications  2. Please call Dr. Fabian Sharp for a follow-up to discuss mood medication  3. Follow-up in 1 year, call for any changes   Seizure Precautions: 1. If medication has been prescribed for you to prevent seizures, take it exactly as directed.  Do not stop taking the medicine without talking to your doctor first, even if you have not had a seizure in a long time.   2. Avoid activities in which a seizure would cause danger to yourself or to others.  Don't operate dangerous machinery, swim alone, or climb in high or dangerous places, such as on ladders, roofs, or girders.  Do not drive unless your doctor says you may.  3. If you have any warning that you may have a seizure, lay down in a safe place where you can't hurt yourself.    4.  No driving for 6 months from last seizure, as per [facility].   Please refer to the following link on the Epilepsy Foundation of America's website for more information: http://www.epilepsyfoundation.org/answerplace/Social/driving/drivingu.cfm   5.  Maintain good sleep hygiene.  6.  Contact your doctor if you have any problems that may be related to the medicine you are taking.  7.  Call 911 and bring the patient back to the ED if:        A.  The seizure lasts longer than 5 minutes.       B.  The patient doesn't awaken shortly after the seizure  C.  The patient has new problems such as difficulty seeing, speaking or moving  D.  The patient was injured during the seizure  E.  The patient has a temperature over 102 F (39C)  F.  The patient vomited and now is having trouble breathing

## 2023-08-04 DIAGNOSIS — R278 Other lack of coordination: Secondary | ICD-10-CM | POA: Diagnosis not present

## 2023-08-04 DIAGNOSIS — I639 Cerebral infarction, unspecified: Secondary | ICD-10-CM | POA: Diagnosis not present

## 2023-08-04 DIAGNOSIS — M6281 Muscle weakness (generalized): Secondary | ICD-10-CM | POA: Diagnosis not present

## 2023-08-05 ENCOUNTER — Encounter: Payer: Self-pay | Admitting: Internal Medicine

## 2023-08-07 NOTE — Telephone Encounter (Signed)
 So sorry  feeling so badly.  The methylphenidate is not prescribed but our team and is a controlled substance  but would be happy to help communicate with prescriber  about the medication optimization or other intervention such as a ssri   Also noted  may benefit from fu  thyroid levels  as well as b12   Agree wit visit   virtual ok  Lab Results  Component Value Date   WBC 5.8 02/07/2023   HGB 12.8 (L) 02/07/2023   HCT 38.9 (L) 02/07/2023   PLT 329 02/07/2023   GLUCOSE 128 (H) 02/07/2023   CHOL 183 11/08/2022   TRIG 70 11/08/2022   HDL 39 (L) 11/08/2022   LDLCALC 130 (H) 11/08/2022   ALT 31 02/07/2023   AST 26 02/07/2023   NA 139 02/07/2023   K 4.1 02/07/2023   CL 104 02/07/2023   CREATININE 0.55 (L) 02/07/2023   BUN 9 02/07/2023   CO2 21 (L) 02/07/2023   TSH 8.104 (H) 11/11/2022   PSA 1.90 10/14/2015   INR 1.2 11/08/2022   HGBA1C 5.7 (H) 11/08/2022   Lab Results  Component Value Date   VITAMINB12 204 11/08/2022

## 2023-08-08 NOTE — Telephone Encounter (Signed)
 Appt is made for 4/8 at 3:30 for virtual visit.

## 2023-08-11 DIAGNOSIS — R1900 Intra-abdominal and pelvic swelling, mass and lump, unspecified site: Secondary | ICD-10-CM | POA: Diagnosis not present

## 2023-08-11 DIAGNOSIS — K469 Unspecified abdominal hernia without obstruction or gangrene: Secondary | ICD-10-CM | POA: Diagnosis not present

## 2023-08-15 ENCOUNTER — Encounter: Payer: Self-pay | Admitting: Internal Medicine

## 2023-08-15 ENCOUNTER — Telehealth (INDEPENDENT_AMBULATORY_CARE_PROVIDER_SITE_OTHER): Admitting: Internal Medicine

## 2023-08-15 DIAGNOSIS — E063 Autoimmune thyroiditis: Secondary | ICD-10-CM

## 2023-08-15 DIAGNOSIS — D510 Vitamin B12 deficiency anemia due to intrinsic factor deficiency: Secondary | ICD-10-CM

## 2023-08-15 DIAGNOSIS — F0631 Mood disorder due to known physiological condition with depressive features: Secondary | ICD-10-CM

## 2023-08-15 DIAGNOSIS — Z79899 Other long term (current) drug therapy: Secondary | ICD-10-CM | POA: Diagnosis not present

## 2023-08-15 DIAGNOSIS — I69359 Hemiplegia and hemiparesis following cerebral infarction affecting unspecified side: Secondary | ICD-10-CM | POA: Diagnosis not present

## 2023-08-15 DIAGNOSIS — I69398 Other sequelae of cerebral infarction: Secondary | ICD-10-CM

## 2023-08-15 DIAGNOSIS — I10 Essential (primary) hypertension: Secondary | ICD-10-CM | POA: Diagnosis not present

## 2023-08-15 NOTE — Progress Notes (Signed)
 Virtual Visit via Video Note  I connected with Samuel Pearson on 08/15/23 at  3:30 PM EDT by a video enabled telemedicine application and verified that I am speaking with the correct person using two identifiers. Location patient: [facility] Location provider:work office Persons participating in the virtual visit: patient, provider spouse Samuel Pearson   Patient aware  of the limitations of evaluation and management by telemedicine and  availability of in person appointments. and agreed to proceed.   HPI: Samuel Pearson presents for video visit with spouse  He is  living at [facility] bound   and wife did most of talking .  She is there with him every day .  He is verbal but  [facility]  and most distressing to him since his cva. 2024  Need help with depressive sx and impatience  sx come and go on given day.  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  Karel Jarvis who advised  he may be helped by ssri for behaviorsal post stroke depression and mood .and irritability   Speaks well and  swallows fine Botox didn't help his  spasticity as per report from wife .  Went off b12 when level was high  and not taking now .  ROS: See pertinent positives and negatives per HPI.  Past Medical History:  Diagnosis Date   Allergic rhinitis    Anxiety    Detached retina    Heart murmur    has had one when a child and checked out ok  never? 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  elevated MCV  No other sx     Past Surgical History:  Procedure Laterality Date   CATARACT EXTRACTION  2009   [facility]   CHOLECYSTECTOMY N/A 02/25/2020   Procedure:  LAPAROSCOPIC CHOLECYSTECTOMY;  Surgeon: Harriette Bouillon, MD;  Location: [facility] OR;  Service: General;  Laterality: N/A;   LOOP RECORDER INSERTION N/A 11/14/2022   Procedure: LOOP RECORDER INSERTION;  Surgeon: Maurice Small, MD;  Location: [facility] INVASIVE CV LAB;  Service: Cardiovascular;  Laterality: N/A;    Family History  Problem Relation Age of Onset   Arthritis Mother    Hypertension Mother    Arthritis Father    Hypertension Father     Social History   Tobacco Use   Smoking status: Never   Smokeless tobacco: Never  Vaping Use   Vaping status: Never Used  Substance Use Topics   Alcohol use: Not Currently    Alcohol/week: 14.0 - 21.0 standard drinks of alcohol    Types: 14 - 21 Cans of beer per week   Drug use: No      Current Outpatient Medications:    acetaminophen (TYLENOL) 325 MG tablet, Take 1-2 tablets (325-650 mg total) by mouth every 4 (four) hours as needed for mild pain., Disp: , Rfl:    amLODipine (NORVASC) 10 MG tablet, Take 10 mg by mouth daily., Disp: , Rfl:    atorvastatin (LIPITOR) 40 MG tablet, Take 1 tablet (40 mg total) by mouth  daily., Disp: 90 tablet, Rfl: 0   Cetirizine HCl (ZYRTEC ALLERGY PO), Take by mouth daily., Disp: , Rfl:    clopidogrel (PLAVIX) 75 MG tablet, Take 1 tablet (75 mg total) by mouth daily., Disp: 90 tablet, Rfl: 3   folic acid (FOLVITE) 1 MG tablet, Take 1 tablet (1 mg total) by mouth daily., Disp: , Rfl:    lisinopril (ZESTRIL) 10 MG tablet, Take 1 tablet (10 mg total) by mouth daily., Disp: , Rfl:    melatonin 5 MG TABS, Take 5 mg by mouth., Disp: , Rfl:    methocarbamol (ROBAXIN) 500 MG tablet, Take 1 tablet (500 mg total) by mouth every 6 (six) hours as needed for muscle spasms., Disp: 10 tablet, Rfl: 0   methylphenidate (RITALIN) 10 MG tablet, Take 1 tablet (10 mg total) by mouth 2 (two) times daily with breakfast and lunch., Disp: 10 tablet, Rfl: 0   Multiple Vitamin (MULTIVITAMIN WITH MINERALS) TABS tablet, Take 1 tablet by mouth  daily., Disp: , Rfl:    levETIRAcetam (KEPPRA) 500 MG tablet, Take 1 tablet (500 mg total) by mouth 2 (two) times daily., Disp: 60 tablet, Rfl: 0   pantoprazole (PROTONIX) 40 MG tablet, Take 1 tablet (40 mg total) by mouth daily. (Patient taking differently: Take 40 mg by mouth daily. As needed.), Disp: , Rfl:   EXAM: BP Readings from Last 3 Encounters:  07/31/23 132/63  06/27/23 125/63  05/16/23 (!) 144/66    VITALS per patient if applicable:  GENERAL: alert,  appears well and in no acute distress prefers wheeled out  after 1/2 of visit   HEENT: atraumatic, conjunttiva clear, no obvious abnormalities on inspection of external nose and ears  NECK: normal movements of the head and neck  LUNGS: on inspection no signs of respiratory distress, breathing rate appears normal, no obvious gross SOB, gasping or wheezing  CV: no obvious cyanosis Speech seems normal  distant and prefers no ia  Lab Results  Component Value Date   WBC 5.8 02/07/2023   HGB 12.8 (L) 02/07/2023   HCT 38.9 (L) 02/07/2023   PLT 329 02/07/2023   GLUCOSE 128 (H) 02/07/2023   CHOL 183 11/08/2022   TRIG 70 11/08/2022   HDL 39 (L) 11/08/2022   LDLCALC 130 (H) 11/08/2022   ALT 31 02/07/2023   AST 26 02/07/2023   NA 139 02/07/2023   K 4.1 02/07/2023   CL 104 02/07/2023   CREATININE 0.55 (L) 02/07/2023   BUN 9 02/07/2023   CO2 21 (L) 02/07/2023   TSH 8.104 (H) 11/11/2022   PSA 1.90 10/14/2015   INR 1.2 11/08/2022   HGBA1C 5.7 (H) 11/08/2022   Lab Results  Component Value Date   VITAMINB12 204 11/08/2022   ASSESSMENT AND PLAN:  Discussed the following assessment and plan:    ICD-10-CM   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 PA (pernicious anemia)  D51.0     4. Medication management  Z79.899     5. 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   Plan blood panel to include b12 tsh free t4 cmp and cbcdiff    Wife Samuel Pearson will find out  if can be drawn at [facility] per order  Then consdier adding low dose sertraline  25 mg and fu in a month and go from there  and fu in a month after change    Expectant management and discussion of plan and treatment with opportunity to ask questions and all were answered. The patient agreed with the plan and demonstrated an understanding of the instructions.   Advised to call back or seek an in-person evaluation if worsening  or having  further concerns  in interim. Return for after labs and meds inditiation can be virtual..    Berniece Andreas, MD

## 2023-08-15 NOTE — Patient Instructions (Addendum)
 Fu after plan  getting  lab updated and medications.

## 2023-08-16 ENCOUNTER — Encounter: Payer: Self-pay | Admitting: Internal Medicine

## 2023-08-16 DIAGNOSIS — E069 Thyroiditis, unspecified: Secondary | ICD-10-CM

## 2023-08-16 DIAGNOSIS — Z8673 Personal history of transient ischemic attack (TIA), and cerebral infarction without residual deficits: Secondary | ICD-10-CM

## 2023-08-16 DIAGNOSIS — D649 Anemia, unspecified: Secondary | ICD-10-CM

## 2023-08-16 DIAGNOSIS — Z79899 Other long term (current) drug therapy: Secondary | ICD-10-CM

## 2023-08-16 DIAGNOSIS — D510 Vitamin B12 deficiency anemia due to intrinsic factor deficiency: Secondary | ICD-10-CM

## 2023-08-18 NOTE — Telephone Encounter (Signed)
 Spoke to pt's spouse. Inform her that we faxed over the order. And went over provider's message. Wife verbalized understanding. Advise to let us know if have any issues.   No further action is needed at this time.

## 2023-08-21 ENCOUNTER — Ambulatory Visit (INDEPENDENT_AMBULATORY_CARE_PROVIDER_SITE_OTHER): Payer: Medicare Other

## 2023-08-21 DIAGNOSIS — I63521 Cerebral infarction due to unspecified occlusion or stenosis of right anterior cerebral artery: Secondary | ICD-10-CM

## 2023-08-21 LAB — CUP PACEART REMOTE DEVICE CHECK
Date Time Interrogation Session: 20250413231949
Implantable Pulse Generator Implant Date: 20240708

## 2023-08-24 DIAGNOSIS — G40909 Epilepsy, unspecified, not intractable, without status epilepticus: Secondary | ICD-10-CM | POA: Diagnosis not present

## 2023-08-24 DIAGNOSIS — E785 Hyperlipidemia, unspecified: Secondary | ICD-10-CM | POA: Diagnosis not present

## 2023-08-24 DIAGNOSIS — I10 Essential (primary) hypertension: Secondary | ICD-10-CM | POA: Diagnosis not present

## 2023-08-28 NOTE — Progress Notes (Signed)
 [facility] Summary Report / Loop Recorder

## 2023-08-30 DIAGNOSIS — D649 Anemia, unspecified: Secondary | ICD-10-CM | POA: Diagnosis not present

## 2023-08-30 DIAGNOSIS — D519 Vitamin B12 deficiency anemia, unspecified: Secondary | ICD-10-CM | POA: Diagnosis not present

## 2023-08-30 DIAGNOSIS — I10 Essential (primary) hypertension: Secondary | ICD-10-CM | POA: Diagnosis not present

## 2023-08-30 LAB — LAB REPORT - SCANNED

## 2023-09-05 ENCOUNTER — Encounter: Payer: Self-pay | Admitting: Cardiovascular Disease

## 2023-09-14 DIAGNOSIS — R278 Other lack of coordination: Secondary | ICD-10-CM | POA: Diagnosis not present

## 2023-09-14 DIAGNOSIS — I639 Cerebral infarction, unspecified: Secondary | ICD-10-CM | POA: Diagnosis not present

## 2023-09-18 DIAGNOSIS — I639 Cerebral infarction, unspecified: Secondary | ICD-10-CM | POA: Diagnosis not present

## 2023-09-18 DIAGNOSIS — E785 Hyperlipidemia, unspecified: Secondary | ICD-10-CM | POA: Diagnosis not present

## 2023-09-18 DIAGNOSIS — I10 Essential (primary) hypertension: Secondary | ICD-10-CM | POA: Diagnosis not present

## 2023-09-21 ENCOUNTER — Encounter: Payer: Medicare Other | Admitting: Physical Medicine & Rehabilitation

## 2023-09-21 DIAGNOSIS — R278 Other lack of coordination: Secondary | ICD-10-CM | POA: Diagnosis not present

## 2023-09-21 DIAGNOSIS — I639 Cerebral infarction, unspecified: Secondary | ICD-10-CM | POA: Diagnosis not present

## 2023-09-22 DIAGNOSIS — R278 Other lack of coordination: Secondary | ICD-10-CM | POA: Diagnosis not present

## 2023-09-22 DIAGNOSIS — I639 Cerebral infarction, unspecified: Secondary | ICD-10-CM | POA: Diagnosis not present

## 2023-09-25 ENCOUNTER — Ambulatory Visit (INDEPENDENT_AMBULATORY_CARE_PROVIDER_SITE_OTHER): Payer: Medicare Other

## 2023-09-25 DIAGNOSIS — I63521 Cerebral infarction due to unspecified occlusion or stenosis of right anterior cerebral artery: Secondary | ICD-10-CM

## 2023-09-25 LAB — CUP PACEART REMOTE DEVICE CHECK
Date Time Interrogation Session: 20250518233323
Implantable Pulse Generator Implant Date: 20240708

## 2023-09-26 DIAGNOSIS — R278 Other lack of coordination: Secondary | ICD-10-CM | POA: Diagnosis not present

## 2023-09-26 DIAGNOSIS — I639 Cerebral infarction, unspecified: Secondary | ICD-10-CM | POA: Diagnosis not present

## 2023-10-02 ENCOUNTER — Ambulatory Visit: Payer: Self-pay | Admitting: Cardiovascular Disease

## 2023-10-02 DIAGNOSIS — R278 Other lack of coordination: Secondary | ICD-10-CM | POA: Diagnosis not present

## 2023-10-02 DIAGNOSIS — I639 Cerebral infarction, unspecified: Secondary | ICD-10-CM | POA: Diagnosis not present

## 2023-10-03 DIAGNOSIS — R278 Other lack of coordination: Secondary | ICD-10-CM | POA: Diagnosis not present

## 2023-10-03 DIAGNOSIS — I639 Cerebral infarction, unspecified: Secondary | ICD-10-CM | POA: Diagnosis not present

## 2023-10-06 DIAGNOSIS — R278 Other lack of coordination: Secondary | ICD-10-CM | POA: Diagnosis not present

## 2023-10-06 DIAGNOSIS — I639 Cerebral infarction, unspecified: Secondary | ICD-10-CM | POA: Diagnosis not present

## 2023-10-09 DIAGNOSIS — I639 Cerebral infarction, unspecified: Secondary | ICD-10-CM | POA: Diagnosis not present

## 2023-10-09 DIAGNOSIS — M6281 Muscle weakness (generalized): Secondary | ICD-10-CM | POA: Diagnosis not present

## 2023-10-09 DIAGNOSIS — R278 Other lack of coordination: Secondary | ICD-10-CM | POA: Diagnosis not present

## 2023-10-10 DIAGNOSIS — I639 Cerebral infarction, unspecified: Secondary | ICD-10-CM | POA: Diagnosis not present

## 2023-10-10 DIAGNOSIS — R278 Other lack of coordination: Secondary | ICD-10-CM | POA: Diagnosis not present

## 2023-10-10 DIAGNOSIS — M6281 Muscle weakness (generalized): Secondary | ICD-10-CM | POA: Diagnosis not present

## 2023-10-11 DIAGNOSIS — I639 Cerebral infarction, unspecified: Secondary | ICD-10-CM | POA: Diagnosis not present

## 2023-10-11 DIAGNOSIS — M6281 Muscle weakness (generalized): Secondary | ICD-10-CM | POA: Diagnosis not present

## 2023-10-11 DIAGNOSIS — R278 Other lack of coordination: Secondary | ICD-10-CM | POA: Diagnosis not present

## 2023-10-11 NOTE — Progress Notes (Signed)
 [facility] Summary Report / Loop Recorder

## 2023-10-12 DIAGNOSIS — M6281 Muscle weakness (generalized): Secondary | ICD-10-CM | POA: Diagnosis not present

## 2023-10-12 DIAGNOSIS — I639 Cerebral infarction, unspecified: Secondary | ICD-10-CM | POA: Diagnosis not present

## 2023-10-12 DIAGNOSIS — R278 Other lack of coordination: Secondary | ICD-10-CM | POA: Diagnosis not present

## 2023-10-26 ENCOUNTER — Ambulatory Visit: Payer: Self-pay | Admitting: Cardiovascular Disease

## 2023-10-26 ENCOUNTER — Ambulatory Visit (INDEPENDENT_AMBULATORY_CARE_PROVIDER_SITE_OTHER)

## 2023-10-26 DIAGNOSIS — I63521 Cerebral infarction due to unspecified occlusion or stenosis of right anterior cerebral artery: Secondary | ICD-10-CM | POA: Diagnosis not present

## 2023-10-26 LAB — CUP PACEART REMOTE DEVICE CHECK
Date Time Interrogation Session: 20250618232751
Implantable Pulse Generator Implant Date: 20240708

## 2023-10-27 DIAGNOSIS — R278 Other lack of coordination: Secondary | ICD-10-CM | POA: Diagnosis not present

## 2023-10-27 DIAGNOSIS — M6281 Muscle weakness (generalized): Secondary | ICD-10-CM | POA: Diagnosis not present

## 2023-10-27 DIAGNOSIS — I639 Cerebral infarction, unspecified: Secondary | ICD-10-CM | POA: Diagnosis not present

## 2023-10-31 ENCOUNTER — Telehealth: Payer: Self-pay | Admitting: Physical Medicine & Rehabilitation

## 2023-10-31 ENCOUNTER — Encounter: Payer: Self-pay | Admitting: Internal Medicine

## 2023-10-31 NOTE — Telephone Encounter (Signed)
 Patients wife called in requesting increase on methylphenidate  (RITALIN ) 10 MG tablet

## 2023-11-01 ENCOUNTER — Telehealth: Payer: Self-pay

## 2023-11-01 NOTE — Telephone Encounter (Signed)
 Patient wife called for patient requesting an increase on his Ritalin . Patient was last seen in February.

## 2023-11-02 NOTE — Telephone Encounter (Signed)
 Patient spouse returned call but disconnected line before being connected to CAL.

## 2023-11-02 NOTE — Telephone Encounter (Signed)
 Attempted to reach pt. Left a voicemail to call us back.

## 2023-11-02 NOTE — Telephone Encounter (Signed)
 Spoke to pt's spouse. She states pt had lab done with [facility]. Inform her we have not receive the result from them. Spouse states she will follow up with them tomorrow. Once they send it, she will send mychart message to update. Then go from there.

## 2023-11-05 ENCOUNTER — Ambulatory Visit: Payer: Self-pay | Admitting: Internal Medicine

## 2023-11-05 NOTE — Progress Notes (Signed)
 Review mild anemia  b12 is 305  thyroid  in range

## 2023-11-07 DIAGNOSIS — I639 Cerebral infarction, unspecified: Secondary | ICD-10-CM | POA: Diagnosis not present

## 2023-11-07 DIAGNOSIS — R278 Other lack of coordination: Secondary | ICD-10-CM | POA: Diagnosis not present

## 2023-11-07 DIAGNOSIS — M6281 Muscle weakness (generalized): Secondary | ICD-10-CM | POA: Diagnosis not present

## 2023-11-08 DIAGNOSIS — M6281 Muscle weakness (generalized): Secondary | ICD-10-CM | POA: Diagnosis not present

## 2023-11-08 DIAGNOSIS — I639 Cerebral infarction, unspecified: Secondary | ICD-10-CM | POA: Diagnosis not present

## 2023-11-08 DIAGNOSIS — R278 Other lack of coordination: Secondary | ICD-10-CM | POA: Diagnosis not present

## 2023-11-10 DIAGNOSIS — G40909 Epilepsy, unspecified, not intractable, without status epilepticus: Secondary | ICD-10-CM | POA: Diagnosis not present

## 2023-11-10 DIAGNOSIS — I10 Essential (primary) hypertension: Secondary | ICD-10-CM | POA: Diagnosis not present

## 2023-11-10 DIAGNOSIS — G4700 Insomnia, unspecified: Secondary | ICD-10-CM | POA: Diagnosis not present

## 2023-11-10 DIAGNOSIS — M6281 Muscle weakness (generalized): Secondary | ICD-10-CM | POA: Diagnosis not present

## 2023-11-10 DIAGNOSIS — I639 Cerebral infarction, unspecified: Secondary | ICD-10-CM | POA: Diagnosis not present

## 2023-11-10 DIAGNOSIS — R278 Other lack of coordination: Secondary | ICD-10-CM | POA: Diagnosis not present

## 2023-11-10 DIAGNOSIS — K219 Gastro-esophageal reflux disease without esophagitis: Secondary | ICD-10-CM | POA: Diagnosis not present

## 2023-11-10 DIAGNOSIS — E785 Hyperlipidemia, unspecified: Secondary | ICD-10-CM | POA: Diagnosis not present

## 2023-11-13 DIAGNOSIS — R278 Other lack of coordination: Secondary | ICD-10-CM | POA: Diagnosis not present

## 2023-11-13 DIAGNOSIS — I639 Cerebral infarction, unspecified: Secondary | ICD-10-CM | POA: Diagnosis not present

## 2023-11-13 DIAGNOSIS — M6281 Muscle weakness (generalized): Secondary | ICD-10-CM | POA: Diagnosis not present

## 2023-11-14 DIAGNOSIS — R278 Other lack of coordination: Secondary | ICD-10-CM | POA: Diagnosis not present

## 2023-11-14 DIAGNOSIS — M6281 Muscle weakness (generalized): Secondary | ICD-10-CM | POA: Diagnosis not present

## 2023-11-14 DIAGNOSIS — I639 Cerebral infarction, unspecified: Secondary | ICD-10-CM | POA: Diagnosis not present

## 2023-11-15 NOTE — Progress Notes (Signed)
 [facility] Summary Report / Loop Recorder

## 2023-11-16 DIAGNOSIS — I639 Cerebral infarction, unspecified: Secondary | ICD-10-CM | POA: Diagnosis not present

## 2023-11-16 DIAGNOSIS — M6281 Muscle weakness (generalized): Secondary | ICD-10-CM | POA: Diagnosis not present

## 2023-11-16 DIAGNOSIS — R278 Other lack of coordination: Secondary | ICD-10-CM | POA: Diagnosis not present

## 2023-11-21 DIAGNOSIS — I639 Cerebral infarction, unspecified: Secondary | ICD-10-CM | POA: Diagnosis not present

## 2023-11-24 DIAGNOSIS — E785 Hyperlipidemia, unspecified: Secondary | ICD-10-CM | POA: Diagnosis not present

## 2023-11-24 DIAGNOSIS — I10 Essential (primary) hypertension: Secondary | ICD-10-CM | POA: Diagnosis not present

## 2023-11-27 ENCOUNTER — Ambulatory Visit

## 2023-11-27 DIAGNOSIS — R278 Other lack of coordination: Secondary | ICD-10-CM | POA: Diagnosis not present

## 2023-11-27 DIAGNOSIS — M6281 Muscle weakness (generalized): Secondary | ICD-10-CM | POA: Diagnosis not present

## 2023-11-27 DIAGNOSIS — I639 Cerebral infarction, unspecified: Secondary | ICD-10-CM | POA: Diagnosis not present

## 2023-11-27 DIAGNOSIS — I63521 Cerebral infarction due to unspecified occlusion or stenosis of right anterior cerebral artery: Secondary | ICD-10-CM

## 2023-11-28 DIAGNOSIS — M6281 Muscle weakness (generalized): Secondary | ICD-10-CM | POA: Diagnosis not present

## 2023-11-28 DIAGNOSIS — I639 Cerebral infarction, unspecified: Secondary | ICD-10-CM | POA: Diagnosis not present

## 2023-11-28 DIAGNOSIS — R278 Other lack of coordination: Secondary | ICD-10-CM | POA: Diagnosis not present

## 2023-11-28 LAB — CUP PACEART REMOTE DEVICE CHECK
Date Time Interrogation Session: 20250720234044
Implantable Pulse Generator Implant Date: 20240708

## 2023-11-29 DIAGNOSIS — I639 Cerebral infarction, unspecified: Secondary | ICD-10-CM | POA: Diagnosis not present

## 2023-11-29 DIAGNOSIS — M6281 Muscle weakness (generalized): Secondary | ICD-10-CM | POA: Diagnosis not present

## 2023-11-29 DIAGNOSIS — R278 Other lack of coordination: Secondary | ICD-10-CM | POA: Diagnosis not present

## 2023-11-30 DIAGNOSIS — M6281 Muscle weakness (generalized): Secondary | ICD-10-CM | POA: Diagnosis not present

## 2023-11-30 DIAGNOSIS — R278 Other lack of coordination: Secondary | ICD-10-CM | POA: Diagnosis not present

## 2023-11-30 DIAGNOSIS — I639 Cerebral infarction, unspecified: Secondary | ICD-10-CM | POA: Diagnosis not present

## 2023-12-01 NOTE — Progress Notes (Signed)
   12/01/2023  Patient ID: Samuel Pearson, male   DOB: August 20, 1945, 78 y.o.   MRN: [id number]  Pharmacy Quality Measure Review  This patient is appearing on a report for being at risk of failing the adherence measure for cholesterol (statin) and hypertension (ACEi/ARB) medications this calendar year.   Medication: Atorvastatin  and Lisinopril  Last fill date: 10/31/23 for 30 day supply; next fill pending for July today  Insurance report was not up to date. No action needed at this time.   Jon VEAR Lindau, PharmD Clinical Pharmacist [phone]

## 2023-12-03 ENCOUNTER — Ambulatory Visit: Payer: Self-pay | Admitting: Cardiovascular Disease

## 2023-12-25 NOTE — Progress Notes (Signed)
 [facility] Summary Report / Loop Recorder

## 2023-12-28 ENCOUNTER — Ambulatory Visit (INDEPENDENT_AMBULATORY_CARE_PROVIDER_SITE_OTHER)

## 2023-12-28 DIAGNOSIS — I63521 Cerebral infarction due to unspecified occlusion or stenosis of right anterior cerebral artery: Secondary | ICD-10-CM

## 2023-12-28 LAB — CUP PACEART REMOTE DEVICE CHECK
Date Time Interrogation Session: 20250820235023
Implantable Pulse Generator Implant Date: 20240708

## 2024-01-01 ENCOUNTER — Ambulatory Visit: Payer: Self-pay | Admitting: Cardiovascular Disease

## 2024-01-01 DIAGNOSIS — K219 Gastro-esophageal reflux disease without esophagitis: Secondary | ICD-10-CM | POA: Diagnosis not present

## 2024-01-01 DIAGNOSIS — I10 Essential (primary) hypertension: Secondary | ICD-10-CM | POA: Diagnosis not present

## 2024-01-01 DIAGNOSIS — E785 Hyperlipidemia, unspecified: Secondary | ICD-10-CM | POA: Diagnosis not present

## 2024-01-25 ENCOUNTER — Telehealth: Payer: Self-pay

## 2024-01-25 NOTE — Progress Notes (Signed)
   01/25/2024  Patient ID: Samuel Pearson, male   DOB: May 17, 1945, 78 y.o.   MRN: [id number]  Pharmacy Quality Measure Review  This patient is appearing on a report for being at risk of failing the adherence measure for cholesterol (statin) and hypertension (ACEi/ARB) medications this calendar year.   Medication: Atorvastatin  and Lisinopril  Last fill date: 11/30/23  Contacted dispensing pharmacy. Patient utilizes 7 day pill packs at their live in facility. Up to date on all fills. LF 01/22/24 for 7 DS.  Jon VEAR Lindau, PharmD Clinical Pharmacist [phone]

## 2024-01-29 ENCOUNTER — Encounter

## 2024-01-29 LAB — CUP PACEART REMOTE DEVICE CHECK
Date Time Interrogation Session: 20250921233558
Implantable Pulse Generator Implant Date: 20240708

## 2024-01-30 NOTE — Progress Notes (Signed)
 Remote Loop Recorder Transmission

## 2024-02-09 ENCOUNTER — Encounter

## 2024-02-12 NOTE — Progress Notes (Signed)
 Remote Loop Recorder Transmission

## 2024-02-18 DIAGNOSIS — K219 Gastro-esophageal reflux disease without esophagitis: Secondary | ICD-10-CM | POA: Diagnosis not present

## 2024-02-18 DIAGNOSIS — I10 Essential (primary) hypertension: Secondary | ICD-10-CM | POA: Diagnosis not present

## 2024-02-18 DIAGNOSIS — E785 Hyperlipidemia, unspecified: Secondary | ICD-10-CM | POA: Diagnosis not present

## 2024-02-27 ENCOUNTER — Telehealth: Payer: Self-pay

## 2024-02-27 NOTE — Telephone Encounter (Addendum)
 Spoke w/ patient wife regarding ILR remote monitoring. 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 No symptoms, tachy, brady, or pause episodes noted on device. No new AF episodes noted on device. Patient wife states they spoke w/ a nurse at [facility] and discussed no episodes have been noted. Patient previously unplugged monitor and requests for remote monitoring to be discontinued. Will update in our system and discontinue remotes at this time.   Will forward to provider for awareness and any further recommendations.   Educated on reason for implant and discussed increased risk for reoccurrence and importance of monitoring. Verbalized understanding and states they want to discontinue monitoring. Informed patient if anything changes or has any further questions or concerns to contact the device clinic. Patient verbalized understanding.

## 2024-02-29 ENCOUNTER — Encounter

## 2024-02-29 ENCOUNTER — Ambulatory Visit

## 2024-03-11 ENCOUNTER — Encounter

## 2024-03-21 ENCOUNTER — Encounter: Payer: Self-pay | Admitting: Neurology

## 2024-03-31 ENCOUNTER — Ambulatory Visit

## 2024-04-01 ENCOUNTER — Encounter

## 2024-04-09 DIAGNOSIS — E039 Hypothyroidism, unspecified: Secondary | ICD-10-CM | POA: Diagnosis not present

## 2024-04-09 DIAGNOSIS — I10 Essential (primary) hypertension: Secondary | ICD-10-CM | POA: Diagnosis not present

## 2024-04-09 DIAGNOSIS — M810 Age-related osteoporosis without current pathological fracture: Secondary | ICD-10-CM | POA: Diagnosis not present

## 2024-04-09 DIAGNOSIS — I639 Cerebral infarction, unspecified: Secondary | ICD-10-CM | POA: Diagnosis not present

## 2024-04-11 ENCOUNTER — Encounter

## 2024-05-12 ENCOUNTER — Encounter

## 2024-06-12 ENCOUNTER — Encounter

## 2024-07-13 ENCOUNTER — Encounter

## 2024-07-30 ENCOUNTER — Ambulatory Visit: Admitting: Neurology
